# Patient Record
Sex: Female | Born: 1975 | Race: White | Hispanic: No | Marital: Married | Smoking: Never smoker
Health system: Southern US, Community
[De-identification: ages and names within clinical notes are randomized; demographics above are authoritative.]

## PROBLEM LIST (undated history)

## (undated) DIAGNOSIS — F419 Anxiety disorder, unspecified: Secondary | ICD-10-CM

## (undated) DIAGNOSIS — T7840XA Allergy, unspecified, initial encounter: Secondary | ICD-10-CM

## (undated) DIAGNOSIS — F2081 Schizophreniform disorder: Secondary | ICD-10-CM

## (undated) DIAGNOSIS — K2970 Gastritis, unspecified, without bleeding: Secondary | ICD-10-CM

## (undated) DIAGNOSIS — E785 Hyperlipidemia, unspecified: Secondary | ICD-10-CM

## (undated) DIAGNOSIS — M1990 Unspecified osteoarthritis, unspecified site: Secondary | ICD-10-CM

## (undated) DIAGNOSIS — E079 Disorder of thyroid, unspecified: Secondary | ICD-10-CM

## (undated) DIAGNOSIS — K5790 Diverticulosis of intestine, part unspecified, without perforation or abscess without bleeding: Secondary | ICD-10-CM

## (undated) DIAGNOSIS — F339 Major depressive disorder, recurrent, unspecified: Secondary | ICD-10-CM

## (undated) HISTORY — DX: Diverticulosis of intestine, part unspecified, without perforation or abscess without bleeding: K57.90

## (undated) HISTORY — DX: Disorder of thyroid, unspecified: E07.9

## (undated) HISTORY — DX: Unspecified osteoarthritis, unspecified site: M19.90

## (undated) HISTORY — DX: Hyperlipidemia, unspecified: E78.5

## (undated) HISTORY — DX: Anxiety disorder, unspecified: F41.9

## (undated) HISTORY — DX: Gastritis, unspecified, without bleeding: K29.70

## (undated) HISTORY — DX: Allergy, unspecified, initial encounter: T78.40XA

---
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
## 1997-03-24 HISTORY — PX: KNEE ARTHROSCOPY: SUR90

## 1998-03-24 HISTORY — PX: ANKLE ARTHROSCOPY: SUR85

## 1999-03-25 HISTORY — PX: LAPAROSCOPY: SHX197

## 2002-10-14 ENCOUNTER — Inpatient Hospital Stay (HOSPITAL_COMMUNITY): Admission: AD | Admit: 2002-10-14 | Discharge: 2002-10-16 | Payer: Self-pay | Admitting: Obstetrics and Gynecology

## 2002-10-17 ENCOUNTER — Encounter: Admission: RE | Admit: 2002-10-17 | Discharge: 2002-11-16 | Payer: Self-pay | Admitting: *Deleted

## 2002-11-17 ENCOUNTER — Encounter: Admission: RE | Admit: 2002-11-17 | Discharge: 2002-12-17 | Payer: Self-pay | Admitting: *Deleted

## 2002-11-17 ENCOUNTER — Other Ambulatory Visit: Admission: RE | Admit: 2002-11-17 | Discharge: 2002-11-17 | Payer: Self-pay | Admitting: Obstetrics and Gynecology

## 2002-12-18 ENCOUNTER — Encounter: Admission: RE | Admit: 2002-12-18 | Discharge: 2003-01-17 | Payer: Self-pay | Admitting: *Deleted

## 2004-03-24 HISTORY — PX: NASAL POLYP SURGERY: SHX186

## 2006-07-23 ENCOUNTER — Ambulatory Visit: Payer: Self-pay | Admitting: Internal Medicine

## 2006-08-18 ENCOUNTER — Ambulatory Visit (HOSPITAL_BASED_OUTPATIENT_CLINIC_OR_DEPARTMENT_OTHER): Admission: RE | Admit: 2006-08-18 | Discharge: 2006-08-18 | Payer: Self-pay | Admitting: Internal Medicine

## 2006-08-30 ENCOUNTER — Ambulatory Visit: Payer: Self-pay | Admitting: Internal Medicine

## 2006-09-04 ENCOUNTER — Ambulatory Visit: Payer: Self-pay | Admitting: Internal Medicine

## 2006-09-29 ENCOUNTER — Ambulatory Visit (HOSPITAL_COMMUNITY): Admission: RE | Admit: 2006-09-29 | Discharge: 2006-09-29 | Payer: Self-pay | Admitting: Chiropractic Medicine

## 2006-10-12 ENCOUNTER — Encounter: Admission: RE | Admit: 2006-10-12 | Discharge: 2007-01-10 | Payer: Self-pay | Admitting: Internal Medicine

## 2006-10-19 ENCOUNTER — Ambulatory Visit: Payer: Self-pay | Admitting: Internal Medicine

## 2006-12-03 ENCOUNTER — Ambulatory Visit: Payer: Self-pay | Admitting: Internal Medicine

## 2007-02-09 ENCOUNTER — Telehealth: Payer: Self-pay | Admitting: Internal Medicine

## 2007-02-10 ENCOUNTER — Telehealth: Payer: Self-pay | Admitting: Internal Medicine

## 2007-02-15 ENCOUNTER — Telehealth: Payer: Self-pay | Admitting: Internal Medicine

## 2007-03-02 ENCOUNTER — Ambulatory Visit: Payer: Self-pay | Admitting: Internal Medicine

## 2007-04-20 ENCOUNTER — Telehealth: Payer: Self-pay | Admitting: Internal Medicine

## 2007-05-04 ENCOUNTER — Ambulatory Visit: Payer: Self-pay | Admitting: Internal Medicine

## 2007-05-10 ENCOUNTER — Telehealth (INDEPENDENT_AMBULATORY_CARE_PROVIDER_SITE_OTHER): Payer: Self-pay | Admitting: *Deleted

## 2007-05-27 ENCOUNTER — Telehealth: Payer: Self-pay | Admitting: Internal Medicine

## 2007-05-28 ENCOUNTER — Emergency Department (HOSPITAL_COMMUNITY): Admission: EM | Admit: 2007-05-28 | Discharge: 2007-05-29 | Payer: Self-pay | Admitting: Emergency Medicine

## 2007-06-04 ENCOUNTER — Telehealth (INDEPENDENT_AMBULATORY_CARE_PROVIDER_SITE_OTHER): Payer: Self-pay | Admitting: *Deleted

## 2007-06-28 ENCOUNTER — Telehealth (INDEPENDENT_AMBULATORY_CARE_PROVIDER_SITE_OTHER): Payer: Self-pay | Admitting: *Deleted

## 2007-08-10 ENCOUNTER — Telehealth (INDEPENDENT_AMBULATORY_CARE_PROVIDER_SITE_OTHER): Payer: Self-pay | Admitting: *Deleted

## 2007-09-01 ENCOUNTER — Ambulatory Visit: Payer: Self-pay | Admitting: Internal Medicine

## 2007-10-12 ENCOUNTER — Telehealth: Payer: Self-pay | Admitting: Internal Medicine

## 2007-11-19 ENCOUNTER — Telehealth: Payer: Self-pay | Admitting: Internal Medicine

## 2007-12-29 ENCOUNTER — Telehealth: Payer: Self-pay | Admitting: Internal Medicine

## 2008-02-04 ENCOUNTER — Telehealth: Payer: Self-pay | Admitting: Internal Medicine

## 2008-03-14 ENCOUNTER — Telehealth: Payer: Self-pay | Admitting: Internal Medicine

## 2008-04-14 ENCOUNTER — Ambulatory Visit: Payer: Self-pay | Admitting: Internal Medicine

## 2008-06-15 ENCOUNTER — Telehealth: Payer: Self-pay | Admitting: Internal Medicine

## 2009-06-22 ENCOUNTER — Telehealth (INDEPENDENT_AMBULATORY_CARE_PROVIDER_SITE_OTHER): Payer: Self-pay | Admitting: *Deleted

## 2010-04-23 NOTE — Progress Notes (Signed)
Summary: Records request from [facility]  Request for records received from [facility]. Request forwarded to [employer]. Charlotte Wise  June 22, 2009 10:21 AM

## 2010-08-06 NOTE — Assessment & Plan Note (Signed)
[name]                             PULMONARY OFFICE NOTE   Charlotte Wise, Charlotte Wise                      MRN:          [id number]  DATE:10/19/2006                            DOB:          01-17-1976    PROBLEM:  Idiopathic hypersomnolence.   HISTORY:  She tried Ritalin 20 mg sustained release, recognizing no  effect at all at 2 of them daily.  She tried 3 daily and 4 daily,  although that was not included in the instructions I gave her.  It did  not seem to help her tiredness, but did make her nauseated.  I spoke  with her firmly about exceeding guidelines on these meds.  She tried  b.i.d. Provigil and said her life is better, but she still needed to  take a caffeine tablet in the morning.  Occasionally, she crashes and  has to go to bed early.  We discussed naps and good sleep hygiene.  She  says she has felt best when she took phentermine 15 mg from her primary  physician prescribed for weight loss.  It helps more to alert her,  especially if combined with caffeine, but can cause palpitation.  Today  she took phentermine and caffeine, but not Provigil.   Her medication is reviewed and listed without changes.   OBJECTIVE:  Weight 236 pounds, BP 116/62, pulse 65 and regular.  Room  air saturation 100%.  She seems alert and focused, significantly obese, no tremor.  Heart sounds are regular without murmur or gallop or extra beats.  Breathing is unlabored.   IMPRESSION:  Idiopathic hypersomnolence.  Relative resistance to  stimulant medications is characteristic of this pattern.   PLAN:  I reviewed Epocrates listing on Xyrem with her and gave her some  patient information and a contact for further information.  We discussed  Dexedrine, and we are going to try that first, giving 10 mg #50 1 or 2  t.i.d. p.r.n., not to exceed 60 mg daily and not to combine with other  meds.  Schedule return 6 weeks, earlier p.r.n.     Clinton D. Maple Hudson, MD, [facility],  FACP  Electronically Signed    CDY/MedQ  DD: 10/19/2006  DT: 10/20/2006  Job #: [id number]   cc:   Lovenia Kim, D.O.

## 2010-08-06 NOTE — Assessment & Plan Note (Signed)
Charlotte Wise                             PULMONARY OFFICE NOTE   Charlotte, Wise                      MRN:          [id number]  DATE:12/03/2006                            DOB:          1976-02-26    PROBLEM:  Idiopathic hypersomnia.   HISTORY:  She has been taking Provigil, one or two a day.  She tried  Dexedrine up to 40 mg a day with no effect at all.  She said at 5-6 tabs  a day (50-60 mg) it would seem to work well but sometimes she would have  a twitch in her eye or transient visual blurring.  She prefers to take  Provigil 200 mg plus caffeine and says then she is usually okay.  I had  a little trouble keeping track of what she felt her best regimen was and  I am not sure if she is clear about this.  We discussed sustained  release Dexedrine.  We had discussed Xyrem last time and she has looked  it up, still considering.  By the end of her discussion, she had decided  that she wanted to primarily use Dexedrine sometimes turning to Provigil  if the Dexedrine did not seem to work well enough.  She has not had  chest pain or palpitation.  Some days by the time she feels really  awake, it is bedtime and she cannot sleep.  We discussed sleep hygiene,  appropriate use of her stimulant medications, and the benefits of  getting some sort of regular exercise.  If those are not sufficient,  then I am giving her a trial of low dose Restoril with samples for  occasional help initiating sleep.   OBJECTIVE:  VITAL SIGNS:  Weight 236 pounds, BP 124/64, pulse 61, room  air saturation 99%.  GENERAL:  She is really quite obese.  She seems alert with a  contemplative sort of air about her.  She seems careful and thoughtful  rather than just drifting or tired.  NEUROLOGIC:  I see no tremor.  HEART:  Pulse is regular without murmur.   IMPRESSION:  Idiopathic hypersomnia.  Earlier workup had shown an AHI of  0.8 per hour without evidence of sleep apnea  despite her weight.   PLAN:  1. She may try Dexedrine 10 mg tabs, up to 180 mg dispensed for up to      6 daily p.r.n.  2. Try Restoril samples 7.5 mg one p.r.n. at h.s.  3. Walk for exercise and maintain good sleep habits.  4. Schedule return in 2 months and consider Xyrem again at that time.     Charlotte D. Maple Hudson, MD, Charlotte Wise, FACP  Electronically Signed    CDY/MedQ  DD: 12/03/2006  DT: 12/04/2006  Job #: [id number]   cc:   Charlotte Wise, D.O.

## 2010-08-06 NOTE — Procedures (Signed)
Charlotte Wise, Charlotte Wise               ACCOUNT NO.:  [id number]   MEDICAL RECORD NO.:  [id number]          PATIENT TYPE:  OUT   LOCATION:  SLEEP CENTER                 FACILITY:  [facility]   PHYSICIAN:  Clinton D. Maple Hudson, MD, FCCP, FACPDATE OF BIRTH:  04/28/74   DATE OF STUDY:                            NOCTURNAL POLYSOMNOGRAM   REFERRING PHYSICIAN:  Clinton D. Young, MD, FCCP, FACP   INDICATION FOR STUDY:  Hypersomnia with sleep apnea.   EPWORTH SLEEPINESS SCORE:  8/24.  BMI 43.7, weight 232 pounds.   MEDICATIONS:  Home medications are listed and reviewed, including  levothyroxine and Provigil. She did not take Provigil for 2 days before  this study.   SLEEP ARCHITECTURE:  Total sleep time 447 minutes with sleep efficiency  94%.  Stage I was 4%, stage II 77%, stages III and IV 10%, REM 9% of  total sleep time.  Sleep latency 11 minutes, REM latency 171 minutes,  awake after sleep onset 19 minutes, arousal index 16.9.  Bedtime  medication included Metoprolol, paroxetine, and Tri-Sprintec.   RESPIRATORY DATA:  Apnea/hypopnea index (AHI, RDI) 0.8 obstructive  events per hour which is within normal limits (normal range is 0-5 per  hour). There were 3 obstructive apnea's and 3 hypopnea's.  All events  were recorded while she was sleeping supine.  REM AHI 1.4/Hr.   OXYGEN DATA:  Mild snoring with oxygen desaturation to a nadir of 86%.  Mean oxygen saturation through the study was 95% on room air.   CARDIAC DATA:  Normal sinus rhythm.   MOVEMENT-PARASOMNIA:  No limb jerk or unusual movement.  No bathroom  visits.   IMPRESSIONS-RECOMMENDATIONS:  1. Unremarkable nighttime sleep with reduced percentage time in REM      which may reflect unfamiliar environment or use of antidepressant      medication.  2.  No significant sleep disorder breathing, AHI      0.8/Hr.      with mild snoring.  2. See report of multiple sleep latency test.      Clinton D. Maple Hudson, MD, [facility], FACP  Diplomate,  Biomedical engineer of Sleep Medicine  Electronically Signed     CDY/MEDQ  D:  08/29/2006 11:42:23  T:  08/29/2006 20:04:11  Job:  [id number]

## 2010-08-06 NOTE — Assessment & Plan Note (Signed)
[facility]                             PULMONARY OFFICE NOTE   Charlotte Wise, Charlotte Wise                      MRN:          [id number]  DATE:09/04/2006                            DOB:          [birth year]    PROBLEM:  Idiopathic hypersomnolence.   HISTORY:  She returns now for followup after her sleep studies.  Her  nocturnal polysomnogram showed unremarkable nighttime sleep with no  significant breathing disorder.  AHI was 0.8 per hour.  There was mild  snoring, and transient oxygen desaturation with no unusual movement or  activity.  The next day she had a Multiple Sleep Latency Test.  Her mean  sleep latency was 3.6 minutes with no REM on any nap.  She did sleep for  each nap.   OBJECTIVE:  Weight 230 pounds.  BP 102/70.  Pulse 66.  Room air  saturation 98%.  She was quiet, obese, no tremor.  HEART:  Sounds regular.  No murmur or gallop.  Breathing unlabored.   MEDICATIONS:  Levothyroxine, glucosamine, Provigil, birth control pill,  Paxil, metoprolol, p.r.n. use of Ecotrin, and Migranal nasal spray.   DRUG INTOLERANCE TO:  1. PENICILLIN.  2. OXYCODONE.  3. HYDROCODONE.   IMPRESSION:  Her sleep studies confirm significant daytime sleepiness in  a pathologic range, but do not point to a specific cause.  We have  discussed treatment options.  She had found some benefit from Provigil  200 mg daily.  We discussed that medication, and the potential for  increasing the dose to as much as 400 mg a day.  As a comparison, we  discussed Ritalin 20 mg SR.  I contrasted these drugs, discussed  controlled status, side effects, and dependence.  I emphasized the  benefit of short naps at available times.  She indicates this would be  very hard.  I discussed sleep hygiene and lifestyle issues.   PLAN:  1. Provigil is rewritten to provide 200 mg 1 or 2 daily p.r.n.  2. Alternative comparison prescription is given for generic Ritalin 20      mg SR, number 30  to take 1 or 2 daily as discussed.  3. Schedule return in 1 month, earlier p.r.n.    Clinton D. Maple Hudson, MD, [facility]oll[facility], FACP  Electronically Signed   CDY/MedQ  DD: 09/05/2006  DT: 09/05/2006  Job #: [id number]   cc:   Lovenia Kim, D.O.

## 2010-08-06 NOTE — Procedures (Signed)
Charlotte Wise, Charlotte Wise               ACCOUNT NO.:  [id number]   MEDICAL RECORD NO.:  [id number]          PATIENT TYPE:  OUT   LOCATION:  SLEEP CENTER                 FACILITY:  [facility]   PHYSICIAN:  Clinton D. Maple Hudson, MD, FCCP, FACPDATE OF BIRTH:  17[birth year]Sep-1974   DATE OF STUDY:  08/19/2006                          MULTIPLE SLEEP LATENCY TEST   REFERRING PHYSICIAN:  Clinton D. Young, MD, FCCP, FACP   INDICATION FOR STUDY:  Multiple sleep latency test for evaluation of  narcolepsy with hypersomnia.  Compare with nocturnal polysomnogram done  on Aug 18, 2006 which recorded an apnea/hypopnea index of 0.8 per hour.  Total sleep time of 447 minutes, with 9% REM.   HOME MEDICATIONS:  Levothyroxine, metoprolol, paroxetine, Tri-Sprintec.  Provigil 200 mg had not been taken in 2 days prior to this study.  Paroxetine is recognized as likely to affect REM.   NAP ARCHITECTURE:  NAP #1:  8:00 a.m.  Sleep latency 0.  Total sleep  time 15 minutes.  REM latency not applicable.  NAP #2:  10:00 a.m.  Sleep latency 2 minutes.  Total sleep time 15  minutes.  REM latency not applicable.  NAP #3:  12:00 p.m.  Sleep latency 4 minutes.  Total sleep time 15  minutes.  REM Latency not applicable.  NAP #4:  2:00 p.m.  Sleep latency 6 minutes.  Total sleep time 15  minutes.  REM latency not applicable.  NAP #5:  4:00 p.m.  Sleep latency 6 minutes.  Total sleep time 14  minutes.  REM latency not applicable.   IMPRESSIONS-RECOMMENDATIONS:  Mean sleep latency 3.6 minutes with no REM  events.  She slept each nap.  Light snoring was noted.  She had taken  glucosamine and Levothyroxine at 3:00 p.m.Marland Kitchen  There was excessive daytime  somnolence, nonspecific with absence of REM on these naps.  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      Clinton D. Maple Hudson, MD, [facility], FACP  Diplomate, Biomedical engineer of Sleep Medicine  Electronically Signed     CDY/MEDQ  D:  08/29/2006 11:54:33  T:  08/30/2006 01:15:38  Job:  [id number]

## 2010-08-09 NOTE — Assessment & Plan Note (Signed)
[facility]                             PULMONARY OFFICE NOTE   Charlotte Wise, Charlotte Wise                      MRN:          [id number]  DATE:07/23/2006                            DOB:          [birth year]    PROBLEM:  A 35 year old woman referred in sleep medicine consultation at  the kind request of Dr. Elisabeth Most with complaint of excessive daytime  sleepiness.   HISTORY OF PRESENT ILLNESS:  She complains of a persistent sense of  fatigue, never rested, no energy. This began around age 42. She was  treated for depression and saw a psychiatrist for about a year and a  half after childbirth but saw no benefit. She is now trying Provigil and  Phentermine, looking for energy and weight loss, while exercising. She  has had a number of tests including a previous sleep study at [facility] without physician evaluation. They found loud snoring but no  evidence of sleep disorder breathing or movement related sleep disorder.  She had an allergy evaluation with some positive skin tests. She tried  steroid taper and injection with no benefit and meds were stopped.  Bedtime is between 9:30 and 10:30 p.m., estimating 20 minute sleep onset  and waking once for the bathroom before up at 6:15. Her main complaint  is that she craves opportunities to nap and never feels refreshed. I  don't get a history of sleep paralysis, cataplexy, or hypnagogic  hallucination, and she does not dream during naps. She has gained about  40 pounds in the last 2 years and understands that she snores some but  wears earplugs to defend against her husband snoring. There had been  some question of chronic fatigue syndrome and discussion of endocrine  evaluation.   MEDICATIONS:  Levothyroxine, multivitamins, glucosamine, Taurine,  Provigil, Phentermine, p.r.n. use of Migranal nasal spray, and of  Ecotrin with Phenergan for migraine.   ALLERGIES:  PENICILLIN, OXYCODONE, HYDROCODONE.   REVIEW OF SYSTEMS:  A 40 pounds weight gain in recent years. Daytime  sleepiness when driving or inactive. Non-restorative sleep, daytime  naps, some bruxism.   PAST MEDICAL HISTORY:  Seasonal allergic rhinitis, frequent headaches,  skin test positive.   DIAGNOSTIC STUDIES:  MRI of the brain showed small white areas, raising  question of vasculopathy. Apparently, neurology did not feel that she  had MS. She tells me that gluten enteropathy and metal toxicity have  been ruled out.   PAST SURGICAL HISTORY:  Surgeries have included arthroscopic surgery on  her knees and left ankle and tonsillectomy. She had no problems with  these surgeries.   SOCIAL HISTORY:  Alcoholic beverage maybe twice a week. Son was born at  age 30, is now in daycare. She is married. Works as a Clinical cytogeneticist for a [employer]   FAMILY HISTORY:  Allergy, thyroid problems. Grandmother had bone and  thyroid cancer. Nobody with sleep problems.   OBJECTIVE:  VITAL SIGNS:  Weight 222 pounds. Blood pressure 100/74,  pulse 73, room air saturation 98%.  GENERAL:  She is quite obese. Affect is rather  flat. Maybe depressed but  alert and oriented. Speech is clear.  NEUROLOGIC:  Unremarkable to observation otherwise.  HEART:  Sounds are regular without murmur or gallop.  HEENT:  Pharynx spacing 2 to 3/4 with no stridor or thyromegaly.  LUNGS:  Fields are clear.   IMPRESSION:  Significant but non-specific daytime somnolence by report.  She had had a previous sleep study in the past but has gained  considerable weight since then and I think of the most useful direction,  would be to repeat the sleep study and also to get a multiple sleep  latency test, attempting objective confirmation of her complaint of  sleepiness during the day. Will schedule these sleep studies and she  will return after that is completed.   I appreciate the chance to meet her.     Clinton D. Maple Hudson, MD, [facility]oll[facility], FACP   Electronically Signed    CDY/MedQ  DD: 07/25/2006  DT: 07/25/2006  Job #: [id number]   cc:   Lovenia Kim, D.O.  [facility]

## 2010-12-16 LAB — URINE CULTURE: Colony Count: 1000

## 2010-12-16 LAB — I-STAT 8, (EC8 V) (CONVERTED LAB)
Acid-base deficit: 4 — ABNORMAL HIGH
BUN: 12
Bicarbonate: 18.9 — ABNORMAL LOW
Chloride: 106
Glucose, Bld: 120 — ABNORMAL HIGH
HCT: 38
Hemoglobin: 12.9
Operator id: 133351
Potassium: 3.9
Sodium: 135
TCO2: 20
pCO2, Ven: 29 — ABNORMAL LOW
pH, Ven: 7.421 — ABNORMAL HIGH

## 2010-12-16 LAB — WET PREP, GENITAL
Clue Cells Wet Prep HPF POC: NONE SEEN
Trich, Wet Prep: NONE SEEN
Yeast Wet Prep HPF POC: NONE SEEN

## 2010-12-16 LAB — URINALYSIS, ROUTINE W REFLEX MICROSCOPIC
Bilirubin Urine: NEGATIVE
Glucose, UA: NEGATIVE
Hgb urine dipstick: NEGATIVE
Ketones, ur: NEGATIVE
Nitrite: NEGATIVE
Protein, ur: NEGATIVE
Specific Gravity, Urine: 1.016
Urobilinogen, UA: 0.2
pH: 5.5

## 2010-12-16 LAB — GC/CHLAMYDIA PROBE AMP, GENITAL
Chlamydia, DNA Probe: NEGATIVE
GC Probe Amp, Genital: NEGATIVE

## 2010-12-16 LAB — URINE MICROSCOPIC-ADD ON

## 2010-12-16 LAB — POCT I-STAT CREATININE
Creatinine, Ser: 0.9
Operator id: 133351

## 2010-12-16 LAB — PREGNANCY, URINE: Preg Test, Ur: NEGATIVE

## 2010-12-16 LAB — RPR: RPR Ser Ql: NONREACTIVE

## 2014-12-21 ENCOUNTER — Ambulatory Visit: Payer: Self-pay | Admitting: Internal Medicine

## 2014-12-25 ENCOUNTER — Ambulatory Visit (INDEPENDENT_AMBULATORY_CARE_PROVIDER_SITE_OTHER): Payer: BLUE CROSS/BLUE SHIELD | Admitting: Internal Medicine

## 2014-12-25 ENCOUNTER — Encounter: Payer: Self-pay | Admitting: Internal Medicine

## 2014-12-25 VITALS — BP 110/64 | HR 70 | Temp 98.6°F | Resp 16 | Ht 61.25 in | Wt 172.0 lb

## 2014-12-25 DIAGNOSIS — J309 Allergic rhinitis, unspecified: Secondary | ICD-10-CM

## 2014-12-25 DIAGNOSIS — E038 Other specified hypothyroidism: Secondary | ICD-10-CM | POA: Diagnosis not present

## 2014-12-25 DIAGNOSIS — M62838 Other muscle spasm: Secondary | ICD-10-CM | POA: Diagnosis not present

## 2014-12-25 DIAGNOSIS — G4710 Hypersomnia, unspecified: Secondary | ICD-10-CM

## 2014-12-25 MED ORDER — LEVOTHYROXINE SODIUM 75 MCG PO TABS: 75.0000 ug | ORAL_TABLET | Freq: Every day | ORAL | Status: DC

## 2014-12-25 MED ORDER — TEMAZEPAM 30 MG PO CAPS: 30.0000 mg | ORAL_CAPSULE | ORAL | Status: DC | PRN

## 2014-12-25 MED ORDER — CARISOPRODOL 350 MG PO TABS: 350.0000 mg | ORAL_TABLET | Freq: Three times a day (TID) | ORAL | Status: DC | PRN

## 2014-12-25 NOTE — Progress Notes (Signed)
Patient ID: Charlotte Wise, female   DOB: Jan 18, 1976, 39 y.o.   MRN: [id number]    Annual Screening Comprehensive Examination   This very nice 39 y.o.female presents for establishment as a new patient.  She reports that she used to be a patient here and they moved away and are just now moving back.  She reports that she currently has hypothyroidism.  She does take levothyroxine for this.    She is currently in the process of trying to get her weight under control and exercising.  She reports that she has been walking daily and she has also been trying to get a healthier lifestyle together.  She reports that she is not currently working at the moment.    She reports that she does see Dr. Leonides Schanz who is a Restaurant manager, fast food here in [place].  She feels like she has had some neck and back pain.    She reports that she is no longer seeing Dr. Annamaria Boots for allergies.    She reports that she does not want any blood work today.    No current outpatient prescriptions on file prior to visit.   No current facility-administered medications on file prior to visit.    Allergies  Allergen Reactions  . Hydrocodone   . Penicillins Swelling    No past medical history on file.   There is no immunization history on file for this patient.  No past surgical history on file.  Family History  Problem Relation Age of Onset  . Cancer Maternal Grandmother   . Hyperlipidemia Paternal Grandfather   . Miscarriages / Stillbirths Paternal Grandfather     Social History   Social History  . Marital Status: Married    Spouse Name: N/A  . Number of Children: N/A  . Years of Education: N/A   Occupational History  . Not on file.   Social History Main Topics  . Smoking status: Never Smoker   . Smokeless tobacco: Not on file  . Alcohol Use: Not on file  . Drug Use: Not on file  . Sexual Activity: Not on file   Other Topics Concern  . Not on file   Social History Narrative  . No narrative on file    Review of Systems  Constitutional: Negative for fever and malaise/fatigue.  HENT: Negative for congestion, ear pain and sore throat.   Eyes: Negative for blurred vision and double vision.  Respiratory: Negative for cough, shortness of breath and wheezing.   Cardiovascular: Negative for chest pain, palpitations and leg swelling.  Gastrointestinal: Negative for heartburn, diarrhea, constipation, blood in stool and melena.  Genitourinary: Negative.   Skin: Negative.   Neurological: Negative for dizziness, sensory change, loss of consciousness and headaches.  Psychiatric/Behavioral: Negative for depression. The patient is not nervous/anxious and does not have insomnia.       Physical Exam  BP 110/64 mmHg  Pulse 70  Temp(Src) 98.6 F (37 C) (Temporal)  Resp 16  Ht 5' 1.25" (1.556 m)  Wt 172 lb (78.019 kg)  BMI 32.22 kg/m2  LMP 12/04/2014  General Appearance: Well nourished and in no apparent distress. Eyes: PERRLA, EOMs, conjunctiva no swelling or erythema, normal fundi and vessels. Sinuses: No frontal/maxillary tenderness ENT/Mouth: EACs patent / TMs  nl. Nares clear without erythema, swelling, mucoid exudates. Oral hygiene is good. No erythema, swelling, or exudate. Tongue normal, non-obstructing. Tonsils not swollen or erythematous. Hearing normal.  Neck: Supple, thyroid normal. No bruits, nodes or JVD. Respiratory: Respiratory effort normal.  BS equal and clear bilateral without rales, rhonci, wheezing or stridor. Cardio: Heart sounds are normal with regular rate and rhythm and no murmurs, rubs or gallops. Peripheral pulses are normal and equal bilaterally without edema. No aortic or femoral bruits. Chest: symmetric with normal excursions and percussion. Breasts: Symmetric, without lumps, nipple discharge, retractions, or fibrocystic changes.  Abdomen: Flat, soft, with bowl sounds. Nontender, no guarding, rebound, hernias, masses, or organomegaly.  Lymphatics: Non tender  without lymphadenopathy.  Genitourinary:  Musculoskeletal: Full ROM all peripheral extremities, joint stability, 5/5 strength, and normal gait. Skin: Warm and dry without rashes, lesions, cyanosis, clubbing or  ecchymosis.  Neuro: Cranial nerves intact, reflexes equal bilaterally. Normal muscle tone, no cerebellar symptoms. Sensation intact.  Pysch: Awake and oriented X 3, normal affect, Insight and Judgment appropriate.   Assessment and Plan    1. Allergic rhinitis, unspecified allergic rhinitis type -currently well controlled and not on medications  2. HYPERSOMNIA, IDIOPATHIC -temazepam for sleeping given -recommended continuing exercise and stretching  3. Muscle spasm -soma given  4.  Hypothryoidism -cont levothyroxine -3 months prescription given  Patient was argumentative and insistent on not doing blood work today as she didn't want to pay for it.  She reports that she just had it at her previous office 2 months ago.  She was counseled on the effects of not being in correct thyroid range and recognized that she was okay not getting it tested.   Patient also offered flu shot today which she declined and stated she would be receiving at work.    Continue prudent diet as discussed, weight control, regular exercise, and medications. Routine screening labs and tests as requested with regular follow-up as recommended.  Over 40 minutes of exam, counseling, chart review and critical decision making was performed

## 2015-02-13 ENCOUNTER — Other Ambulatory Visit: Payer: Self-pay | Admitting: Internal Medicine

## 2015-03-28 ENCOUNTER — Encounter: Payer: Self-pay | Admitting: Internal Medicine

## 2015-03-28 ENCOUNTER — Ambulatory Visit (INDEPENDENT_AMBULATORY_CARE_PROVIDER_SITE_OTHER): Payer: BLUE CROSS/BLUE SHIELD | Admitting: Internal Medicine

## 2015-03-28 VITALS — BP 130/78 | HR 90 | Temp 98.2°F | Resp 16 | Ht 61.25 in | Wt 174.0 lb

## 2015-03-28 DIAGNOSIS — E039 Hypothyroidism, unspecified: Secondary | ICD-10-CM

## 2015-03-28 DIAGNOSIS — Z79899 Other long term (current) drug therapy: Secondary | ICD-10-CM | POA: Diagnosis not present

## 2015-03-28 LAB — CBC WITH DIFFERENTIAL/PLATELET
Basophils Absolute: 0 10*3/uL (ref 0.0–0.1)
Basophils Relative: 0 % (ref 0–1)
Eosinophils Absolute: 0.4 10*3/uL (ref 0.0–0.7)
Eosinophils Relative: 5 % (ref 0–5)
HCT: 35.5 % — ABNORMAL LOW (ref 36.0–46.0)
Hemoglobin: 12.1 g/dL (ref 12.0–15.0)
Lymphocytes Relative: 26 % (ref 12–46)
Lymphs Abs: 1.9 10*3/uL (ref 0.7–4.0)
MCH: 32.1 pg (ref 26.0–34.0)
MCHC: 34.1 g/dL (ref 30.0–36.0)
MCV: 94.2 fL (ref 78.0–100.0)
MPV: 8.8 fL (ref 8.6–12.4)
Monocytes Absolute: 0.7 10*3/uL (ref 0.1–1.0)
Monocytes Relative: 9 % (ref 3–12)
Neutro Abs: 4.4 10*3/uL (ref 1.7–7.7)
Neutrophils Relative %: 60 % (ref 43–77)
Platelets: 333 10*3/uL (ref 150–400)
RBC: 3.77 MIL/uL — ABNORMAL LOW (ref 3.87–5.11)
RDW: 14.5 % (ref 11.5–15.5)
WBC: 7.4 10*3/uL (ref 4.0–10.5)

## 2015-03-28 LAB — BASIC METABOLIC PANEL WITH GFR
BUN: 13 mg/dL (ref 7–25)
CO2: 24 mmol/L (ref 20–31)
Calcium: 9 mg/dL (ref 8.6–10.2)
Chloride: 106 mmol/L (ref 98–110)
Creat: 0.84 mg/dL (ref 0.50–1.10)
GFR, Est African American: >89 mL/min (ref >=60)
GFR, Est Non African American: 87 mL/min (ref >=60)
Glucose, Bld: 91 mg/dL (ref 65–99)
Potassium: 4.6 mmol/L (ref 3.5–5.3)
Sodium: 139 mmol/L (ref 135–146)

## 2015-03-28 LAB — HEPATIC FUNCTION PANEL
ALT: 13 U/L (ref 6–29)
AST: 15 U/L (ref 10–30)
Albumin: 4.2 g/dL (ref 3.6–5.1)
Alkaline Phosphatase: 47 U/L (ref 33–115)
Bilirubin, Direct: 0.1 mg/dL (ref <=0.2)
Indirect Bilirubin: 0.6 mg/dL (ref 0.2–1.2)
Total Bilirubin: 0.7 mg/dL (ref 0.2–1.2)
Total Protein: 6.3 g/dL (ref 6.1–8.1)

## 2015-03-28 LAB — TSH: TSH: 1.547 u[IU]/mL (ref 0.350–4.500)

## 2015-03-28 MED ORDER — TEMAZEPAM 30 MG PO CAPS: 30.0000 mg | ORAL_CAPSULE | ORAL | Status: DC | PRN

## 2015-03-28 NOTE — Progress Notes (Signed)
Patient ID: Charlotte Wise, female   DOB: 1975-10-01, 40 y.o.   MRN: [id number]  Assessment and Plan:  Hypothyroidism -TSH -may need refill of levothyroxine  Muscle cramps -cont soma prn  Insomnia -Temazepam  Continue diet and meds as discussed. Further disposition pending results of labs.  HPI 40 y.o. female  presents for 3 month follow up with hypertension, hyperlipidemia, prediabetes and vitamin D.  She does report that she has been having cold symptoms for the last 4 days.  She does take sudafed which helps.  She reports that she was around a lot of sick people.    Patient is on Vitamin D supplement. No results found for: VD25OH    Current Medications:  Current Outpatient Prescriptions on File Prior to Visit  Medication Sig Dispense Refill  . carisoprodol (SOMA) 350 MG tablet Take 1 tablet (350 mg total) by mouth every 8 (eight) hours as needed for muscle spasms. 30 tablet 2  . levothyroxine (SYNTHROID, LEVOTHROID) 75 MCG tablet Take 1 tablet (75 mcg total) by mouth daily before breakfast. 90 tablet 1  . temazepam (RESTORIL) 30 MG capsule Take 1 capsule (30 mg total) by mouth as needed for sleep. 30 capsule 2   No current facility-administered medications on file prior to visit.    Medical History:  Past Medical History  Diagnosis Date  . Allergy   . Anxiety   . Arthritis   . Thyroid disease     Allergies:  Allergies  Allergen Reactions  . Hydrocodone   . Penicillins Swelling     Review of Systems:  Review of Systems  Constitutional: Negative for fever, chills, malaise/fatigue and diaphoresis.  HENT: Positive for congestion. Negative for ear pain and sore throat.   Eyes: Negative.   Respiratory: Positive for cough. Negative for shortness of breath and wheezing.   Cardiovascular: Negative for chest pain, palpitations and leg swelling.  Gastrointestinal: Negative for heartburn, abdominal pain, diarrhea, constipation, blood in stool and melena.  Genitourinary:  Negative.   Skin: Negative.   Neurological: Negative for dizziness, sensory change, loss of consciousness and headaches.  Psychiatric/Behavioral: Negative for depression. The patient is not nervous/anxious and does not have insomnia.     Family history- Review and unchanged  Social history- Review and unchanged  Physical Exam: BP 130/78 mmHg  Pulse 90  Temp(Src) 98.2 F (36.8 C) (Temporal)  Resp 16  Ht 5' 1.25" (1.556 m)  Wt 174 lb (78.926 kg)  BMI 32.60 kg/m2  LMP 03/14/2015 Wt Readings from Last 3 Encounters:  03/28/15 174 lb (78.926 kg)  12/25/14 172 lb (78.019 kg)  04/14/08 227 lb (102.967 kg)    General Appearance: Well nourished well developed, in no apparent distress. Eyes: PERRLA, EOMs, conjunctiva no swelling or erythema ENT/Mouth: Ear canals normal without obstruction, swelling, erythma, discharge.  TMs normal bilaterally.  Oropharynx moist, clear, without exudate, or postoropharyngeal swelling. Neck: Supple, thyroid normal,no cervical adenopathy  Respiratory: Respiratory effort normal, Breath sounds clear A&P without rhonchi, wheeze, or rale.  No retractions, no accessory usage. Cardio: RRR with no MRGs. Brisk peripheral pulses without edema.  Abdomen: Soft, + BS,  Non tender, no guarding, rebound, hernias, masses. Musculoskeletal: Full ROM, 5/5 strength, Normal gait Skin: Warm, dry without rashes, lesions, ecchymosis.  Neuro: Awake and oriented X 3, Cranial nerves intact. Normal muscle tone, no cerebellar symptoms. Psych: Normal affect, Insight and Judgment appropriate.    Starlyn Skeans, PA-C 10:43 AM [facility]

## 2015-03-28 NOTE — Patient Instructions (Addendum)
[facility] Department  Address: [address], [place], [place] [address]  Open today  8AM-5PM  Phone: [phone]  Call here to figure out travel recommendations.  You can also see recommended shots on the CDC websites based on your travel locations.  Please buy flonase, nasacort, or rhinocort 2 sprays per nostril at bedtime to help with nasal drainage and congestion.

## 2015-06-29 DIAGNOSIS — M791 Myalgia: Secondary | ICD-10-CM | POA: Diagnosis not present

## 2015-06-29 DIAGNOSIS — M9906 Segmental and somatic dysfunction of lower extremity: Secondary | ICD-10-CM | POA: Diagnosis not present

## 2015-06-29 DIAGNOSIS — M9905 Segmental and somatic dysfunction of pelvic region: Secondary | ICD-10-CM | POA: Diagnosis not present

## 2015-06-29 DIAGNOSIS — M9904 Segmental and somatic dysfunction of sacral region: Secondary | ICD-10-CM | POA: Diagnosis not present

## 2015-07-19 DIAGNOSIS — M791 Myalgia: Secondary | ICD-10-CM | POA: Diagnosis not present

## 2015-07-19 DIAGNOSIS — M9906 Segmental and somatic dysfunction of lower extremity: Secondary | ICD-10-CM | POA: Diagnosis not present

## 2015-07-19 DIAGNOSIS — M9905 Segmental and somatic dysfunction of pelvic region: Secondary | ICD-10-CM | POA: Diagnosis not present

## 2015-07-19 DIAGNOSIS — M9904 Segmental and somatic dysfunction of sacral region: Secondary | ICD-10-CM | POA: Diagnosis not present

## 2015-07-30 DIAGNOSIS — Z124 Encounter for screening for malignant neoplasm of cervix: Secondary | ICD-10-CM | POA: Diagnosis not present

## 2015-07-30 DIAGNOSIS — N72 Inflammatory disease of cervix uteri: Secondary | ICD-10-CM | POA: Diagnosis not present

## 2015-07-30 DIAGNOSIS — Z01419 Encounter for gynecological examination (general) (routine) without abnormal findings: Secondary | ICD-10-CM | POA: Diagnosis not present

## 2015-07-30 LAB — HM PAP SMEAR: HM Pap smear: NORMAL

## 2015-08-15 DIAGNOSIS — M9905 Segmental and somatic dysfunction of pelvic region: Secondary | ICD-10-CM | POA: Diagnosis not present

## 2015-08-15 DIAGNOSIS — M9904 Segmental and somatic dysfunction of sacral region: Secondary | ICD-10-CM | POA: Diagnosis not present

## 2015-08-15 DIAGNOSIS — M791 Myalgia: Secondary | ICD-10-CM | POA: Diagnosis not present

## 2015-08-15 DIAGNOSIS — M9906 Segmental and somatic dysfunction of lower extremity: Secondary | ICD-10-CM | POA: Diagnosis not present

## 2015-08-21 DIAGNOSIS — Z1231 Encounter for screening mammogram for malignant neoplasm of breast: Secondary | ICD-10-CM | POA: Diagnosis not present

## 2015-09-14 ENCOUNTER — Other Ambulatory Visit: Payer: Self-pay | Admitting: Internal Medicine

## 2015-09-18 DIAGNOSIS — M791 Myalgia: Secondary | ICD-10-CM | POA: Diagnosis not present

## 2015-09-18 DIAGNOSIS — M9901 Segmental and somatic dysfunction of cervical region: Secondary | ICD-10-CM | POA: Diagnosis not present

## 2015-09-18 DIAGNOSIS — M9906 Segmental and somatic dysfunction of lower extremity: Secondary | ICD-10-CM | POA: Diagnosis not present

## 2015-09-18 DIAGNOSIS — M9904 Segmental and somatic dysfunction of sacral region: Secondary | ICD-10-CM | POA: Diagnosis not present

## 2015-09-27 ENCOUNTER — Ambulatory Visit: Payer: Self-pay | Admitting: Internal Medicine

## 2015-10-09 DIAGNOSIS — M542 Cervicalgia: Secondary | ICD-10-CM | POA: Diagnosis not present

## 2015-10-09 DIAGNOSIS — M25551 Pain in right hip: Secondary | ICD-10-CM | POA: Diagnosis not present

## 2015-10-10 ENCOUNTER — Ambulatory Visit: Payer: Self-pay | Admitting: Internal Medicine

## 2015-10-12 ENCOUNTER — Encounter: Payer: Self-pay | Admitting: Internal Medicine

## 2015-10-12 ENCOUNTER — Ambulatory Visit (INDEPENDENT_AMBULATORY_CARE_PROVIDER_SITE_OTHER): Payer: BLUE CROSS/BLUE SHIELD | Admitting: Internal Medicine

## 2015-10-12 VITALS — BP 122/64 | HR 62 | Temp 98.0°F | Resp 16 | Ht 61.25 in | Wt 162.0 lb

## 2015-10-12 DIAGNOSIS — Z130 Encounter for screening for diseases of the blood and blood-forming organs and certain disorders involving the immune mechanism: Secondary | ICD-10-CM | POA: Diagnosis not present

## 2015-10-12 DIAGNOSIS — Z79899 Other long term (current) drug therapy: Secondary | ICD-10-CM | POA: Diagnosis not present

## 2015-10-12 DIAGNOSIS — E039 Hypothyroidism, unspecified: Secondary | ICD-10-CM | POA: Diagnosis not present

## 2015-10-12 DIAGNOSIS — Z131 Encounter for screening for diabetes mellitus: Secondary | ICD-10-CM | POA: Diagnosis not present

## 2015-10-12 DIAGNOSIS — R1031 Right lower quadrant pain: Secondary | ICD-10-CM | POA: Diagnosis not present

## 2015-10-12 LAB — CBC WITH DIFFERENTIAL/PLATELET
Basophils Absolute: 57 cells/uL (ref 0–200)
Basophils Relative: 1 %
Eosinophils Absolute: 285 cells/uL (ref 15–500)
Eosinophils Relative: 5 %
HCT: 38.1 % (ref 35.0–45.0)
Hemoglobin: 12.5 g/dL (ref 11.7–15.5)
Lymphocytes Relative: 35 %
Lymphs Abs: 1995 cells/uL (ref 850–3900)
MCH: 32.1 pg (ref 27.0–33.0)
MCHC: 32.8 g/dL (ref 32.0–36.0)
MCV: 97.7 fL (ref 80.0–100.0)
MPV: 9 fL (ref 7.5–12.5)
Monocytes Absolute: 513 cells/uL (ref 200–950)
Monocytes Relative: 9 %
Neutro Abs: 2850 cells/uL (ref 1500–7800)
Neutrophils Relative %: 50 %
Platelets: 345 10*3/uL (ref 140–400)
RBC: 3.9 MIL/uL (ref 3.80–5.10)
RDW: 14.7 % (ref 11.0–15.0)
WBC: 5.7 10*3/uL (ref 3.8–10.8)

## 2015-10-12 LAB — HEPATIC FUNCTION PANEL
ALT: 13 U/L (ref 6–29)
AST: 19 U/L (ref 10–30)
Albumin: 3.9 g/dL (ref 3.6–5.1)
Alkaline Phosphatase: 51 U/L (ref 33–115)
Bilirubin, Direct: 0.2 mg/dL (ref <=0.2)
Indirect Bilirubin: 0.6 mg/dL (ref 0.2–1.2)
Total Bilirubin: 0.8 mg/dL (ref 0.2–1.2)
Total Protein: 6.2 g/dL (ref 6.1–8.1)

## 2015-10-12 LAB — IRON AND TIBC
%SAT: 30 % (ref 11–50)
Iron: 97 ug/dL (ref 40–190)
TIBC: 328 ug/dL (ref 250–450)
UIBC: 231 ug/dL (ref 125–400)

## 2015-10-12 LAB — BASIC METABOLIC PANEL WITH GFR
BUN: 15 mg/dL (ref 7–25)
CO2: 24 mmol/L (ref 20–31)
Calcium: 8.7 mg/dL (ref 8.6–10.2)
Chloride: 104 mmol/L (ref 98–110)
Creat: 0.98 mg/dL (ref 0.50–1.10)
GFR, Est African American: 83 mL/min (ref >=60)
GFR, Est Non African American: 72 mL/min (ref >=60)
Glucose, Bld: 77 mg/dL (ref 65–99)
Potassium: 4.8 mmol/L (ref 3.5–5.3)
Sodium: 140 mmol/L (ref 135–146)

## 2015-10-12 LAB — VITAMIN B12: Vitamin B-12: 1266 pg/mL — ABNORMAL HIGH (ref 200–1100)

## 2015-10-12 LAB — HEMOGLOBIN A1C
Hgb A1c MFr Bld: 5.2 % (ref <5.7)
Mean Plasma Glucose: 103 mg/dL

## 2015-10-12 LAB — TSH: TSH: 2.05 mIU/L

## 2015-10-12 MED ORDER — TRIAMCINOLONE ACETONIDE 0.1 % EX CREA: 1.0000 "application " | TOPICAL_CREAM | Freq: Four times a day (QID) | CUTANEOUS | Status: DC | PRN

## 2015-10-12 NOTE — Progress Notes (Signed)
Assessment and Plan:   Hypothyroidism -tsh -levothyroxine  RLQ abdominal pain -ultrasound -referral to GI -CBC -BMET -HFP -celiac panel  Neck pain -lyrica -likely fibromyalgia  Screening for def anemia -B12 -Iron and TIBC  Continue diet and meds as discussed. Further disposition pending results of labs.  HPI 40 y.o. female  presents for 6 month follow up with hypothyroid. She reports that she is taking her medication daily on an empty stomach.    She is having some pain in her right lower abdomen.  This has been a spot that has been bothering her for several years.  It does tend to be related to her food intake.  She reports that she has gone to obgyn and ultrasound was normal.  She feels like it might be intestinal .  She reports that the pain is very intermittent.  She reports that the foods that bother her.  She reports that she is concerned that she has some issues with food sensitivity, but she is not sure whether this is a due to food allergy or whether it is an intestinal issue.  She reports that wheat definitely bothers her.  So does dairy and meats.  She thinks that she has been tested for celiacs disease in the past.  She also reports that she has been having some issues with back pain after eating.  She reports that she does think that she takes something to help with her gallbladder.  She reports that she has normal stools.  She goes once per day.  She reports that the stools are well formed.  She does eat plenty of fruits and veggies.  Grains do tend to make her constipated.  She denies family history of IBS, IBD,  Or colon cancer.     She is seeing a PT for her neck and her shoulders and back.  She has been doing dry needling with [facility].  She is wondering whether she can try some lyrica.  She reports that she was not able to take gabapentin.    She also feels like she is hot more often.  She feels like her scalp sweats a lot.  She also notes that  her feet have the same feeling that things are crawling on her when her skin gets warm.  She feels like it may be a heat rash.       Current Medications:  Current Outpatient Prescriptions on File Prior to Visit  Medication Sig Dispense Refill  . carisoprodol (SOMA) 350 MG tablet TAKE 1 TABLET BY MOUTH EVERY 8 HOURS AS NEEDED FOR MUSCLE SPASMS 30 tablet 0  . levothyroxine (SYNTHROID, LEVOTHROID) 75 MCG tablet Take 1 tablet (75 mcg total) by mouth daily before breakfast. 90 tablet 1  . temazepam (RESTORIL) 30 MG capsule TAKE ONE CAPSULE BY MOUTH AS NEEDED FOR SLEEP 30 capsule 0   No current facility-administered medications on file prior to visit.    Medical History:  Past Medical History  Diagnosis Date  . Allergy   . Anxiety   . Arthritis   . Thyroid disease     Allergies:  Allergies  Allergen Reactions  . Hydrocodone   . Penicillins Swelling     Review of Systems:  Review of Systems  Constitutional: Negative for fever, chills and malaise/fatigue.  HENT: Negative for congestion, ear pain and sore throat.   Respiratory: Negative for cough, shortness of breath and wheezing.   Cardiovascular: Negative for chest pain, palpitations and leg swelling.  Gastrointestinal:  Positive for abdominal pain. Negative for heartburn, diarrhea, constipation, blood in stool and melena.  Genitourinary: Negative.   Skin: Negative.   Neurological: Negative for dizziness, sensory change, loss of consciousness and headaches.  Psychiatric/Behavioral: Negative for depression. The patient is not nervous/anxious and does not have insomnia.     Family history- Review and unchanged  Social history- Review and unchanged  Physical Exam: BP 122/64 mmHg  Pulse 62  Temp(Src) 98 F (36.7 C) (Temporal)  Resp 16  Ht 5' 1.25" (1.556 m)  Wt 162 lb (73.483 kg)  BMI 30.35 kg/m2  LMP 09/25/2015 Wt Readings from Last 3 Encounters:  10/12/15 162 lb (73.483 kg)  03/28/15 174 lb (78.926 kg)  12/25/14 172  lb (78.019 kg)    General Appearance: Well nourished well developed, in no apparent distress. Eyes: PERRLA, EOMs, conjunctiva no swelling or erythema ENT/Mouth: Ear canals normal without obstruction, swelling, erythma, discharge.  TMs normal bilaterally.  Oropharynx moist, clear, without exudate, or postoropharyngeal swelling. Neck: Supple, thyroid normal,no cervical adenopathy  Respiratory: Respiratory effort normal, Breath sounds clear A&P without rhonchi, wheeze, or rale.  No retractions, no accessory usage. Cardio: RRR with no MRGs. Brisk peripheral pulses without edema.  Abdomen: Soft, + BS,  Non tender, no guarding, rebound, hernias, masses. Musculoskeletal: Full ROM, 5/5 strength, Normal gait Skin: Warm, dry without rashes, lesions, ecchymosis.  Neuro: Awake and oriented X 3, Cranial nerves intact. Normal muscle tone, no cerebellar symptoms. Psych: Normal affect, Insight and Judgment appropriate.    Starlyn Skeans, PA-C 10:53 AM [facility]

## 2015-10-12 NOTE — Patient Instructions (Signed)
Please start taking lyrica 1 tablet per day at bedtime for 3-5 days.  If you tolerate the medication well then please increase the tablet to twice daily.  You can take this with or without food.    Please increase to 75 mg after you complete the 50 mg tablets.  Call if this works well for you.  Katrina will call you about getting set up with a GI doctor and will also get you set up with an ultrasound

## 2015-10-15 ENCOUNTER — Other Ambulatory Visit: Payer: Self-pay | Admitting: *Deleted

## 2015-10-15 LAB — CELIAC PANEL 10
Endomysial Screen: NEGATIVE
Gliadin IgA: 4 Units (ref <20)
Gliadin IgG: 2 Units (ref <20)
IgA: 146 mg/dL (ref 81–463)
Tissue Transglut Ab: 1 U/mL (ref <6)
Tissue Transglutaminase Ab, IgA: 1 U/mL (ref <4)

## 2015-10-15 MED ORDER — TEMAZEPAM 30 MG PO CAPS: ORAL_CAPSULE | ORAL | 0 refills | Status: DC

## 2015-10-15 MED ORDER — CARISOPRODOL 350 MG PO TABS: ORAL_TABLET | ORAL | 0 refills | Status: DC

## 2015-10-15 MED ORDER — LEVOTHYROXINE SODIUM 75 MCG PO TABS: 75.0000 ug | ORAL_TABLET | Freq: Every day | ORAL | 1 refills | Status: DC

## 2015-10-16 DIAGNOSIS — M9901 Segmental and somatic dysfunction of cervical region: Secondary | ICD-10-CM | POA: Diagnosis not present

## 2015-10-16 DIAGNOSIS — M791 Myalgia: Secondary | ICD-10-CM | POA: Diagnosis not present

## 2015-10-16 DIAGNOSIS — M9904 Segmental and somatic dysfunction of sacral region: Secondary | ICD-10-CM | POA: Diagnosis not present

## 2015-10-16 DIAGNOSIS — M9906 Segmental and somatic dysfunction of lower extremity: Secondary | ICD-10-CM | POA: Diagnosis not present

## 2015-10-30 DIAGNOSIS — M542 Cervicalgia: Secondary | ICD-10-CM | POA: Diagnosis not present

## 2015-10-30 DIAGNOSIS — M25551 Pain in right hip: Secondary | ICD-10-CM | POA: Diagnosis not present

## 2015-11-13 DIAGNOSIS — M9901 Segmental and somatic dysfunction of cervical region: Secondary | ICD-10-CM | POA: Diagnosis not present

## 2015-11-13 DIAGNOSIS — M9906 Segmental and somatic dysfunction of lower extremity: Secondary | ICD-10-CM | POA: Diagnosis not present

## 2015-11-13 DIAGNOSIS — M791 Myalgia: Secondary | ICD-10-CM | POA: Diagnosis not present

## 2015-11-13 DIAGNOSIS — M9904 Segmental and somatic dysfunction of sacral region: Secondary | ICD-10-CM | POA: Diagnosis not present

## 2015-11-21 DIAGNOSIS — M9906 Segmental and somatic dysfunction of lower extremity: Secondary | ICD-10-CM | POA: Diagnosis not present

## 2015-11-21 DIAGNOSIS — M9904 Segmental and somatic dysfunction of sacral region: Secondary | ICD-10-CM | POA: Diagnosis not present

## 2015-11-21 DIAGNOSIS — M9901 Segmental and somatic dysfunction of cervical region: Secondary | ICD-10-CM | POA: Diagnosis not present

## 2015-11-21 DIAGNOSIS — M791 Myalgia: Secondary | ICD-10-CM | POA: Diagnosis not present

## 2015-11-30 ENCOUNTER — Encounter: Payer: Self-pay | Admitting: Gastroenterology

## 2015-11-30 ENCOUNTER — Other Ambulatory Visit: Payer: Self-pay | Admitting: Internal Medicine

## 2015-11-30 DIAGNOSIS — R1011 Right upper quadrant pain: Secondary | ICD-10-CM

## 2015-12-04 ENCOUNTER — Encounter: Payer: Self-pay | Admitting: Gastroenterology

## 2015-12-04 ENCOUNTER — Ambulatory Visit (INDEPENDENT_AMBULATORY_CARE_PROVIDER_SITE_OTHER): Payer: BLUE CROSS/BLUE SHIELD | Admitting: Gastroenterology

## 2015-12-04 VITALS — BP 110/70 | HR 80 | Ht 61.25 in | Wt 163.0 lb

## 2015-12-04 DIAGNOSIS — K589 Irritable bowel syndrome without diarrhea: Secondary | ICD-10-CM | POA: Diagnosis not present

## 2015-12-04 DIAGNOSIS — R140 Abdominal distension (gaseous): Secondary | ICD-10-CM | POA: Diagnosis not present

## 2015-12-04 DIAGNOSIS — R1031 Right lower quadrant pain: Secondary | ICD-10-CM

## 2015-12-04 NOTE — Patient Instructions (Addendum)
Please have your Primary Care doctor fax your ultrasound results to Dr Silverio Decamp at [phone] att [facility]ett  Low FODMAP diet given  Use VSL # 3 112 Units daily , which can be purchased over the counter  Follow up with Dr Silverio Decamp on 02/18/2016 at 10am

## 2015-12-04 NOTE — Progress Notes (Signed)
Charlotte Wise    CS:[id number]    [birth year]  Primary Care Physician:MCKEOWN,WILLIAM DAVID, MD  Referring Physician: Starlyn Skeans, PA-C [address] [place], [place] [address]  Chief complaint:  RLQ pain HPI: 51 yr F here with c/o chronic intermittent RLQ abd pain. certain foods bring on the pain, only tolerates rice, any other grain bothers her. Dairy and sugar makes it worse including some spices. Some fruits and vegetables as well. About 5 years had a severe episode, saw Ob gyn, did ultrasound that was alright. On scale about 3-4 on worse on some days, most days a low abdominal pain. Also has intense bloating and excessive flatus that is fowl smelling.  Denies any nausea, vomiting, melena or bright red blood per rectum She had multiple antibiotics for treatment of sinus infection and UTI    Outpatient Encounter Prescriptions as of 12/04/2015  Medication Sig  . carisoprodol (SOMA) 350 MG tablet TAKE 1 TABLET BY MOUTH EVERY 8 HOURS AS NEEDED FOR MUSCLE SPASMS  . levothyroxine (SYNTHROID, LEVOTHROID) 75 MCG tablet Take 1 tablet (75 mcg total) by mouth daily before breakfast.  . temazepam (RESTORIL) 30 MG capsule TAKE ONE CAPSULE BY MOUTH AS NEEDED FOR SLEEP  . triamcinolone cream (KENALOG) 0.1 % Apply 1 application topically 4 (four) times daily as needed.   No facility-administered encounter medications on file as of 12/04/2015.     Allergies as of 12/04/2015 - Review Complete 12/04/2015  Allergen Reaction Noted  . Hydrocodone  01/13/2007  . Penicillins Swelling 01/13/2007    Past Medical History:  Diagnosis Date  . Allergy   . Anxiety   . Arthritis   . Thyroid disease     Past Surgical History:  Procedure Laterality Date  . ANKLE ARTHROSCOPY Right 2000  . KNEE ARTHROSCOPY Left 1988  . KNEE ARTHROSCOPY Right 1999  . LAPAROSCOPY  2001  . NASAL POLYP SURGERY  2006  . TONSILLECTOMY AND ADENOIDECTOMY  1981    Family History  Problem  Relation Age of Onset  . Cancer Maternal Grandmother   . Hyperlipidemia Paternal Grandfather   . Miscarriages / Stillbirths Paternal Grandfather   . Heart disease Paternal Grandfather   . Clotting disorder Mother   . Kidney disease Maternal Grandfather     Social History   Social History  . Marital status: Married    Spouse name: N/A  . Number of children: N/A  . Years of education: N/A   Occupational History  . Not on file.   Social History Main Topics  . Smoking status: Never Smoker  . Smokeless tobacco: Never Used  . Alcohol use No  . Drug use: No  . Sexual activity: Not on file   Other Topics Concern  . Not on file   Social History Narrative  . No narrative on file      Review of systems: Review of Systems  Constitutional: Negative for fever and chills.  HENT: Negative.   Eyes: Negative for blurred vision.  Respiratory: Negative for cough, shortness of breath and wheezing.   Cardiovascular: Negative for chest pain and palpitations.  Gastrointestinal: as per HPI Genitourinary: Negative for dysuria, urgency, frequency and hematuria.  Musculoskeletal: Negative for myalgias, back pain and joint pain.  Skin: Negative for itching and rash.  Neurological: Negative for dizziness, tremors, focal weakness, seizures and loss of consciousness.  Endo/Heme/Allergies: Positive for seasonal allergies.  Psychiatric/Behavioral: Negative for depression, suicidal ideas and hallucinations.  All other systems reviewed and are negative.   Physical Exam: Vitals:   12/04/15 0832  BP: 110/70  Pulse: 80   Body mass index is 30.55 kg/m. Gen:      No acute distress HEENT:  EOMI, sclera anicteric Neck:     No masses; no thyromegaly Lungs:    Clear to auscultation bilaterally; normal respiratory effort CV:         Regular rate and rhythm; no murmurs Abd:      + bowel sounds; soft, non-tender; no palpable masses, no distension Ext:    No edema; adequate peripheral  perfusion Skin:      Warm and dry; no rash Neuro: alert and oriented x 3 Psych: normal mood and affect  Data Reviewed:  Reviewed labs, radiologic imaging, old records and pertinent past GI work up   Assessment and Plan/Recommendations:  6 yr F with intermittent RLQ pain associated with excessive bloating and flatus Celiac panel negative Will follow up results of abdominal ultrasound requested by PMD Start Probiotic VSL# 3 1 capsule daily Trial of Low FODMAP diet Will reevaluate in 6-8 weeks, if persistent symptoms will consider endoscopic evaluation or further imaging.  Greater than 50% of the time used for counseling as well as treatment plan and follow-up. She had multiple questions which were answered to her satisfaction  K. Denzil Magnuson , MD [phone] Mon-Fri 8a-5p [phone] after 5p, weekends, holidays  CC: Starlyn Skeans, PA-C

## 2015-12-11 DIAGNOSIS — H04123 Dry eye syndrome of bilateral lacrimal glands: Secondary | ICD-10-CM | POA: Diagnosis not present

## 2015-12-11 DIAGNOSIS — H353131 Nonexudative age-related macular degeneration, bilateral, early dry stage: Secondary | ICD-10-CM | POA: Diagnosis not present

## 2015-12-12 ENCOUNTER — Other Ambulatory Visit: Payer: Self-pay

## 2015-12-12 ENCOUNTER — Ambulatory Visit
Admission: RE | Admit: 2015-12-12 | Discharge: 2015-12-12 | Disposition: A | Discharge status: Home / Self Care | Payer: BLUE CROSS/BLUE SHIELD | Source: Ambulatory Visit | Attending: Internal Medicine | Admitting: Internal Medicine

## 2015-12-12 DIAGNOSIS — R1011 Right upper quadrant pain: Secondary | ICD-10-CM

## 2015-12-13 DIAGNOSIS — M25551 Pain in right hip: Secondary | ICD-10-CM | POA: Diagnosis not present

## 2015-12-13 DIAGNOSIS — M9906 Segmental and somatic dysfunction of lower extremity: Secondary | ICD-10-CM | POA: Diagnosis not present

## 2015-12-13 DIAGNOSIS — M791 Myalgia: Secondary | ICD-10-CM | POA: Diagnosis not present

## 2015-12-13 DIAGNOSIS — M9901 Segmental and somatic dysfunction of cervical region: Secondary | ICD-10-CM | POA: Diagnosis not present

## 2015-12-13 DIAGNOSIS — M9904 Segmental and somatic dysfunction of sacral region: Secondary | ICD-10-CM | POA: Diagnosis not present

## 2015-12-13 DIAGNOSIS — M542 Cervicalgia: Secondary | ICD-10-CM | POA: Diagnosis not present

## 2015-12-21 DIAGNOSIS — M9904 Segmental and somatic dysfunction of sacral region: Secondary | ICD-10-CM | POA: Diagnosis not present

## 2015-12-21 DIAGNOSIS — M9901 Segmental and somatic dysfunction of cervical region: Secondary | ICD-10-CM | POA: Diagnosis not present

## 2015-12-21 DIAGNOSIS — M9906 Segmental and somatic dysfunction of lower extremity: Secondary | ICD-10-CM | POA: Diagnosis not present

## 2015-12-21 DIAGNOSIS — M791 Myalgia: Secondary | ICD-10-CM | POA: Diagnosis not present

## 2016-01-01 DIAGNOSIS — M25551 Pain in right hip: Secondary | ICD-10-CM | POA: Diagnosis not present

## 2016-01-01 DIAGNOSIS — M542 Cervicalgia: Secondary | ICD-10-CM | POA: Diagnosis not present

## 2016-01-07 ENCOUNTER — Encounter (INDEPENDENT_AMBULATORY_CARE_PROVIDER_SITE_OTHER): Payer: Self-pay

## 2016-01-07 DIAGNOSIS — M542 Cervicalgia: Secondary | ICD-10-CM | POA: Diagnosis not present

## 2016-01-07 DIAGNOSIS — M25551 Pain in right hip: Secondary | ICD-10-CM | POA: Diagnosis not present

## 2016-01-17 ENCOUNTER — Ambulatory Visit (INDEPENDENT_AMBULATORY_CARE_PROVIDER_SITE_OTHER): Payer: BLUE CROSS/BLUE SHIELD | Admitting: Internal Medicine

## 2016-01-17 ENCOUNTER — Encounter: Payer: Self-pay | Admitting: Internal Medicine

## 2016-01-17 VITALS — BP 116/70 | HR 66 | Temp 98.2°F | Ht 61.25 in | Wt 152.0 lb

## 2016-01-17 DIAGNOSIS — E039 Hypothyroidism, unspecified: Secondary | ICD-10-CM

## 2016-01-17 DIAGNOSIS — K589 Irritable bowel syndrome without diarrhea: Secondary | ICD-10-CM

## 2016-01-17 DIAGNOSIS — Z79899 Other long term (current) drug therapy: Secondary | ICD-10-CM | POA: Diagnosis not present

## 2016-01-17 DIAGNOSIS — Z23 Encounter for immunization: Secondary | ICD-10-CM | POA: Diagnosis not present

## 2016-01-17 LAB — HEPATIC FUNCTION PANEL
ALT: 12 U/L (ref 6–29)
AST: 17 U/L (ref 10–30)
Albumin: 4 g/dL (ref 3.6–5.1)
Alkaline Phosphatase: 39 U/L (ref 33–115)
Bilirubin, Direct: 0.2 mg/dL (ref <=0.2)
Indirect Bilirubin: 0.5 mg/dL (ref 0.2–1.2)
Total Bilirubin: 0.7 mg/dL (ref 0.2–1.2)
Total Protein: 6.1 g/dL (ref 6.1–8.1)

## 2016-01-17 LAB — CBC WITH DIFFERENTIAL/PLATELET
Basophils Absolute: 72 cells/uL (ref 0–200)
Basophils Relative: 1 %
Eosinophils Absolute: 1728 cells/uL — ABNORMAL HIGH (ref 15–500)
Eosinophils Relative: 24 %
HCT: 37.9 % (ref 35.0–45.0)
Hemoglobin: 12.2 g/dL (ref 11.7–15.5)
Lymphocytes Relative: 30 %
Lymphs Abs: 2160 cells/uL (ref 850–3900)
MCH: 31.4 pg (ref 27.0–33.0)
MCHC: 32.2 g/dL (ref 32.0–36.0)
MCV: 97.4 fL (ref 80.0–100.0)
MPV: 8.9 fL (ref 7.5–12.5)
Monocytes Absolute: 648 cells/uL (ref 200–950)
Monocytes Relative: 9 %
Neutro Abs: 2592 cells/uL (ref 1500–7800)
Neutrophils Relative %: 36 %
Platelets: 337 10*3/uL (ref 140–400)
RBC: 3.89 MIL/uL (ref 3.80–5.10)
RDW: 14.5 % (ref 11.0–15.0)
WBC: 7.2 10*3/uL (ref 3.8–10.8)

## 2016-01-17 LAB — BASIC METABOLIC PANEL WITH GFR
BUN: 9 mg/dL (ref 7–25)
CO2: 22 mmol/L (ref 20–31)
Calcium: 9 mg/dL (ref 8.6–10.2)
Chloride: 104 mmol/L (ref 98–110)
Creat: 0.82 mg/dL (ref 0.50–1.10)
GFR, Est African American: >89 mL/min (ref >=60)
GFR, Est Non African American: 89 mL/min (ref >=60)
Glucose, Bld: 90 mg/dL (ref 65–99)
Potassium: 4.2 mmol/L (ref 3.5–5.3)
Sodium: 138 mmol/L (ref 135–146)

## 2016-01-17 LAB — TSH: TSH: 0.22 mIU/L — ABNORMAL LOW

## 2016-01-17 MED ORDER — TEMAZEPAM 30 MG PO CAPS: ORAL_CAPSULE | ORAL | 0 refills | Status: DC

## 2016-01-17 MED ORDER — PREGABALIN 50 MG PO CAPS: 50.0000 mg | ORAL_CAPSULE | Freq: Every day | ORAL | 2 refills | Status: DC

## 2016-01-17 NOTE — Progress Notes (Signed)
Assessment and Plan:  Hypertension:  -Continue medication,  -monitor blood pressure at home.  -Continue DASH diet.   -Reminder to go to the ER if any CP, SOB, nausea, dizziness, severe HA, changes vision/speech, left arm numbness and tingling, and jaw pain.  Vitamin D Def: -check level -continue medications.   Fibromyalgia -lyrica 50 mg prn as needed -cont soma  Insomnia -cont restoril  IBS -cont FODMAP diet -send to nutrition to help with increased variety in her diet  Hypothyroidism -cont levothyroxine -TSH  Continue diet and meds as discussed. Further disposition pending results of labs.  HPI 40 y.o. female  presents for 3 month follow up with hypertension, hyperlipidemia, prediabetes and vitamin D.   Her blood pressure has been controlled at home, today their BP is BP: 116/70.   She does not workout. She denies chest pain, shortness of breath, dizziness.   She is on cholesterol medication and denies myalgias. Her cholesterol is at goal. The cholesterol last visit was:  No results found for: CHOL, HDL, LDLCALC, LDLDIRECT, TRIG, CHOLHDL   She has been working on diet and exercise for prediabetes, and denies foot ulcerations, hyperglycemia, hypoglycemia , increased appetite, nausea, paresthesia of the feet, polydipsia, polyuria, visual disturbances, vomiting and weight loss. Last A1C in the office was:  Lab Results  Component Value Date   HGBA1C 5.2 10/12/2015    Patient is on Vitamin D supplement. No results found for: VD25OH    Patient has been following with gastroenterology for her digestive issues.  She is taking a probiotic and has also started the FODMAP diet.  She reports that she feels like her appetite has decreased a little bit.  She reports that she is doing a little bit better.  She reports that she is doing a little bit better.  She reports that she is definitely seeing that foods are definitively affecting it.    She is still taking her thyroid medication.   She takes it first thing in the morning on an empty stomach.    She has tried taking the lyrica at home.  She reports that she still has lots of muscle issues and pain with her "fibromyalgia".  She felt like the 50 mg did help a little bit.  She didn't like the 75 mg tablets.  She would like to have a prescription to take as needed.     Current Medications:  Current Outpatient Prescriptions on File Prior to Visit  Medication Sig Dispense Refill  . carisoprodol (SOMA) 350 MG tablet TAKE 1 TABLET BY MOUTH EVERY 8 HOURS AS NEEDED FOR MUSCLE SPASMS 30 tablet 0  . levothyroxine (SYNTHROID, LEVOTHROID) 75 MCG tablet Take 1 tablet (75 mcg total) by mouth daily before breakfast. 90 tablet 1  . temazepam (RESTORIL) 30 MG capsule TAKE ONE CAPSULE BY MOUTH AS NEEDED FOR SLEEP 30 capsule 0  . triamcinolone cream (KENALOG) 0.1 % Apply 1 application topically 4 (four) times daily as needed. 30 g 0   No current facility-administered medications on file prior to visit.     Medical History:  Past Medical History:  Diagnosis Date  . Allergy   . Anxiety   . Arthritis   . Thyroid disease     Allergies:  Allergies  Allergen Reactions  . Hydrocodone   . Penicillins Swelling     Review of Systems:  Review of Systems  Constitutional: Positive for malaise/fatigue. Negative for chills and fever.  HENT: Negative for congestion, ear pain and sore throat.  Eyes: Negative.   Respiratory: Negative for cough, shortness of breath and wheezing.   Cardiovascular: Negative for chest pain, palpitations and leg swelling.  Gastrointestinal: Positive for abdominal pain. Negative for blood in stool, constipation, diarrhea, heartburn and melena.  Genitourinary: Negative.   Skin: Negative.   Neurological: Negative for dizziness, sensory change, loss of consciousness and headaches.  Psychiatric/Behavioral: Negative for depression. The patient is nervous/anxious. The patient does not have insomnia.     Family  history- Review and unchanged  Social history- Review and unchanged  Physical Exam: BP 116/70   Pulse 66   Temp 98.2 F (36.8 C) (Temporal)   Ht 5' 1.25" (1.556 m)   Wt 152 lb (68.9 kg)   BMI 28.49 kg/m  Wt Readings from Last 3 Encounters:  01/17/16 152 lb (68.9 kg)  12/04/15 163 lb (73.9 kg)  10/12/15 162 lb (73.5 kg)    General Appearance: Well nourished well developed, in no apparent distress. Eyes: PERRLA, EOMs, conjunctiva no swelling or erythema ENT/Mouth: Ear canals normal without obstruction, swelling, erythma, discharge.  TMs normal bilaterally.  Oropharynx moist, clear, without exudate, or postoropharyngeal swelling. Neck: Supple, thyroid normal,no cervical adenopathy  Respiratory: Respiratory effort normal, Breath sounds clear A&P without rhonchi, wheeze, or rale.  No retractions, no accessory usage. Cardio: RRR with no MRGs. Brisk peripheral pulses without edema.  Abdomen: Soft, + BS,  Non tender, no guarding, rebound, hernias, masses. Musculoskeletal: Full ROM, 5/5 strength, Normal gait Skin: Warm, dry without rashes, lesions, ecchymosis.  Neuro: Awake and oriented X 3, Cranial nerves intact. Normal muscle tone, no cerebellar symptoms. Psych: Normal affect, Insight and Judgment appropriate.    Starlyn Skeans, PA-C 9:54 AM [facility]

## 2016-01-22 DIAGNOSIS — M542 Cervicalgia: Secondary | ICD-10-CM | POA: Diagnosis not present

## 2016-01-22 DIAGNOSIS — M25551 Pain in right hip: Secondary | ICD-10-CM | POA: Diagnosis not present

## 2016-01-23 DIAGNOSIS — M9906 Segmental and somatic dysfunction of lower extremity: Secondary | ICD-10-CM | POA: Diagnosis not present

## 2016-01-23 DIAGNOSIS — M791 Myalgia: Secondary | ICD-10-CM | POA: Diagnosis not present

## 2016-01-23 DIAGNOSIS — M9901 Segmental and somatic dysfunction of cervical region: Secondary | ICD-10-CM | POA: Diagnosis not present

## 2016-01-23 DIAGNOSIS — M9904 Segmental and somatic dysfunction of sacral region: Secondary | ICD-10-CM | POA: Diagnosis not present

## 2016-02-18 ENCOUNTER — Ambulatory Visit (INDEPENDENT_AMBULATORY_CARE_PROVIDER_SITE_OTHER): Payer: BLUE CROSS/BLUE SHIELD | Admitting: Gastroenterology

## 2016-02-18 ENCOUNTER — Encounter: Payer: Self-pay | Admitting: Gastroenterology

## 2016-02-18 VITALS — BP 110/62 | HR 80 | Ht 61.0 in | Wt 157.2 lb

## 2016-02-18 DIAGNOSIS — K588 Other irritable bowel syndrome: Secondary | ICD-10-CM

## 2016-02-18 DIAGNOSIS — R143 Flatulence: Secondary | ICD-10-CM | POA: Diagnosis not present

## 2016-02-18 DIAGNOSIS — R140 Abdominal distension (gaseous): Secondary | ICD-10-CM | POA: Diagnosis not present

## 2016-02-18 DIAGNOSIS — R1031 Right lower quadrant pain: Secondary | ICD-10-CM

## 2016-02-18 DIAGNOSIS — G8929 Other chronic pain: Secondary | ICD-10-CM

## 2016-02-18 MED ORDER — RIFAXIMIN 550 MG PO TABS: 550.0000 mg | ORAL_TABLET | Freq: Three times a day (TID) | ORAL | 0 refills | Status: DC

## 2016-02-18 NOTE — Progress Notes (Signed)
Charlotte Wise    ZK:[id number]    1975-07-03  Primary Care Physician:MCKEOWN,WILLIAM DAVID, MD  Referring Physician: Unk Pinto, MD [address] [place], [place] [address]  Chief complaint:  IBS, RLQ pain, foul smelling flatus and stool  HPI: 40 yr F here with c/o chronic intermittent RLQ abd pain. She was last seen in office in September 2017 with similar complaints . She has tried low fodmap diet with improvement, slowly added back all the foods after 8 weeks of low fodmap diet and she feels her symptoms have recurred now. She complains of foul-smelling flatus and stool associated with right lower quadrant pain. This is been going on for past 7-8 years. She had undergone laparoscopy about 20 years ago for similar complaints to exclude endometriosis.  On a scale about 3-4 pain on worse days, most of the days a very mild abdominal pain in RLQ.   Denies any nausea, vomiting, constipation, diarrhea, melena or bright red blood per rectum. She has regular daily bowel movements Certain foods bring on the pain, only tolerates rice, any other grain bothers her. Dairy, sugar and tomatoes makes it worse including all spices. Some fruits and vegetables as well. About 5 years ago had a severe episode, saw Ob gyn, did ultrasound that was alright. She had multiple antibiotics for treatment of sinus infection and UTI in the past and she usually gets an yeast infection after antibiotic use per patient    Outpatient Encounter Prescriptions as of 02/18/2016  Medication Sig  . carisoprodol (SOMA) 350 MG tablet TAKE 1 TABLET BY MOUTH EVERY 8 HOURS AS NEEDED FOR MUSCLE SPASMS  . levothyroxine (SYNTHROID, LEVOTHROID) 75 MCG tablet Take 1 tablet (75 mcg total) by mouth daily before breakfast.  . pregabalin (LYRICA) 50 MG capsule Take 1 capsule (50 mg total) by mouth daily.  . temazepam (RESTORIL) 30 MG capsule TAKE ONE CAPSULE BY MOUTH AS NEEDED FOR SLEEP  . triamcinolone cream  (KENALOG) 0.1 % Apply 1 application topically 4 (four) times daily as needed.   No facility-administered encounter medications on file as of 02/18/2016.     Allergies as of 02/18/2016 - Review Complete 02/18/2016  Allergen Reaction Noted  . Hydrocodone  01/13/2007  . Penicillins Swelling 01/13/2007    Past Medical History:  Diagnosis Date  . Allergy   . Anxiety   . Arthritis   . Thyroid disease     Past Surgical History:  Procedure Laterality Date  . ANKLE ARTHROSCOPY Right 2000  . KNEE ARTHROSCOPY Left 1988  . KNEE ARTHROSCOPY Right 1999  . LAPAROSCOPY  2001  . NASAL POLYP SURGERY  2006  . TONSILLECTOMY AND ADENOIDECTOMY  1981    Family History  Problem Relation Age of Onset  . Cancer Maternal Grandmother   . Hyperlipidemia Paternal Grandfather   . Miscarriages / Stillbirths Paternal Grandfather   . Heart disease Paternal Grandfather   . Clotting disorder Mother   . Kidney disease Maternal Grandfather     Social History   Social History  . Marital status: Married    Spouse name: N/A  . Number of children: N/A  . Years of education: N/A   Occupational History  . Not on file.   Social History Main Topics  . Smoking status: Never Smoker  . Smokeless tobacco: Never Used  . Alcohol use No  . Drug use: No  . Sexual activity: Not on file   Other Topics Concern  .  Not on file   Social History Narrative  . No narrative on file      Review of systems: Review of Systems  Constitutional: Negative for fever and chills.  HENT: Negative.   Eyes: Negative for blurred vision.  Respiratory: Negative for cough, shortness of breath and wheezing.   Cardiovascular: Negative for chest pain and palpitations.  Gastrointestinal: as per HPI Genitourinary: Negative for dysuria, urgency, frequency and hematuria.  Musculoskeletal: Positive for myalgias, back pain and joint pain.  Skin: Negative for itching and rash.  Neurological: Negative for dizziness, tremors, focal  weakness, seizures and loss of consciousness.  Endo/Heme/Allergies: Positive for seasonal allergies.  Psychiatric/Behavioral: Negative for depression, suicidal ideas and hallucinations.  All other systems reviewed and are negative.   Physical Exam: Vitals:   02/18/16 1001  BP: 110/62  Pulse: 80   Body mass index is 29.71 kg/m. Gen:      No acute distress HEENT:  EOMI, sclera anicteric Neck:     No masses; no thyromegaly Lungs:    Clear to auscultation bilaterally; normal respiratory effort CV:         Regular rate and rhythm; no murmurs Abd:      + bowel sounds; soft, non-tender; no palpable masses, no distension Ext:    No edema; adequate peripheral perfusion Skin:      Warm and dry; no rash Neuro: alert and oriented x 3 Psych: normal mood and affect  Data Reviewed:  Reviewed labs, radiology imaging, old records and pertinent past GI work up Abdominal ultrasound 12/12/2015 1. No gallstones or biliary distention.  2. Liver is echogenic consistent with fatty infiltration and/or hepatocellular disease.  Assessment and Plan/Recommendations: 40 year old female with history of chronic intermittent right lower quadrant pain for past 10-20 years here for follow-up visit Excessive bloating and foul smelling flatus, she may have a competent of small intestinal bacterial overgrowth. We'll do a course of rifaximin 550 mg 3 times daily for 2 weeks Continue low forte map diet Restart probiotics after completion of rifaximin Discussed in detail with patient that given the chronicity of her symptoms and unrevealing workup for any significant pathology based on labs and imaging, endoscopic evaluation at this point will likely be very low yield  25 minutes was spent face-to-face with the patient. Greater than 50% of the time used for counseling as well as treatment plan and follow-up. She had multiple questions which were answered to her satisfaction  K. Denzil Magnuson , MD [phone]  Mon-Fri 8a-5p [phone] after 5p, weekends, holidays  CC: Unk Pinto, MD

## 2016-02-18 NOTE — Patient Instructions (Addendum)
We have sent in your prescription to your pharmacy    Continue the [place] given previously  Start probiotic after completion of antibiotic

## 2016-02-20 ENCOUNTER — Telehealth: Payer: Self-pay | Admitting: *Deleted

## 2016-02-20 NOTE — Telephone Encounter (Signed)
Insurance will not cover xifaxian, called patient today to pick up samples she will be here tomorrow

## 2016-04-14 ENCOUNTER — Encounter (HOSPITAL_COMMUNITY): Payer: Self-pay | Admitting: *Deleted

## 2016-04-14 ENCOUNTER — Emergency Department (HOSPITAL_COMMUNITY)
Admission: EM | Admit: 2016-04-14 | Discharge: 2016-04-15 | Disposition: A | Discharge status: Other Acute Inpatient Hospital | Payer: BLUE CROSS/BLUE SHIELD | Attending: Emergency Medicine | Admitting: Emergency Medicine

## 2016-04-14 DIAGNOSIS — R443 Hallucinations, unspecified: Secondary | ICD-10-CM | POA: Diagnosis present

## 2016-04-14 DIAGNOSIS — Z79899 Other long term (current) drug therapy: Secondary | ICD-10-CM | POA: Insufficient documentation

## 2016-04-14 DIAGNOSIS — F419 Anxiety disorder, unspecified: Secondary | ICD-10-CM | POA: Diagnosis not present

## 2016-04-14 DIAGNOSIS — F418 Other specified anxiety disorders: Secondary | ICD-10-CM | POA: Diagnosis not present

## 2016-04-14 DIAGNOSIS — F69 Unspecified disorder of adult personality and behavior: Secondary | ICD-10-CM | POA: Diagnosis not present

## 2016-04-14 DIAGNOSIS — F339 Major depressive disorder, recurrent, unspecified: Secondary | ICD-10-CM | POA: Insufficient documentation

## 2016-04-14 DIAGNOSIS — F333 Major depressive disorder, recurrent, severe with psychotic symptoms: Secondary | ICD-10-CM | POA: Diagnosis not present

## 2016-04-14 DIAGNOSIS — Z8249 Family history of ischemic heart disease and other diseases of the circulatory system: Secondary | ICD-10-CM | POA: Diagnosis not present

## 2016-04-14 DIAGNOSIS — Z9889 Other specified postprocedural states: Secondary | ICD-10-CM | POA: Diagnosis not present

## 2016-04-14 DIAGNOSIS — Z808 Family history of malignant neoplasm of other organs or systems: Secondary | ICD-10-CM | POA: Diagnosis not present

## 2016-04-14 HISTORY — DX: Major depressive disorder, recurrent, unspecified: F33.9

## 2016-04-14 LAB — RAPID URINE DRUG SCREEN, HOSP PERFORMED
Amphetamines: NOT DETECTED
Barbiturates: NOT DETECTED
Benzodiazepines: POSITIVE — AB
Cocaine: NOT DETECTED
Opiates: NOT DETECTED
Tetrahydrocannabinol: NOT DETECTED

## 2016-04-14 LAB — COMPREHENSIVE METABOLIC PANEL
ALT: 29 U/L (ref 14–54)
AST: 62 U/L — ABNORMAL HIGH (ref 15–41)
Albumin: 5.1 g/dL — ABNORMAL HIGH (ref 3.5–5.0)
Alkaline Phosphatase: 45 U/L (ref 38–126)
Anion gap: 12 (ref 5–15)
BUN: 12 mg/dL (ref 6–20)
CO2: 22 mmol/L (ref 22–32)
Calcium: 9.3 mg/dL (ref 8.9–10.3)
Chloride: 103 mmol/L (ref 101–111)
Creatinine, Ser: 0.78 mg/dL (ref 0.44–1.00)
GFR calc Af Amer: >60 mL/min (ref >60)
GFR calc non Af Amer: >60 mL/min (ref >60)
Glucose, Bld: 92 mg/dL (ref 65–99)
Potassium: 3.5 mmol/L (ref 3.5–5.1)
Sodium: 137 mmol/L (ref 135–145)
Total Bilirubin: 1.2 mg/dL (ref 0.3–1.2)
Total Protein: 7.5 g/dL (ref 6.5–8.1)

## 2016-04-14 LAB — CBC
HCT: 37.6 % (ref 36.0–46.0)
Hemoglobin: 13 g/dL (ref 12.0–15.0)
MCH: 32.7 pg (ref 26.0–34.0)
MCHC: 34.6 g/dL (ref 30.0–36.0)
MCV: 94.7 fL (ref 78.0–100.0)
Platelets: 346 10*3/uL (ref 150–400)
RBC: 3.97 MIL/uL (ref 3.87–5.11)
RDW: 13.3 % (ref 11.5–15.5)
WBC: 8.5 10*3/uL (ref 4.0–10.5)

## 2016-04-14 LAB — SALICYLATE LEVEL: Salicylate Lvl: <7 mg/dL (ref 2.8–30.0)

## 2016-04-14 LAB — ETHANOL: Alcohol, Ethyl (B): <5 mg/dL (ref <5)

## 2016-04-14 LAB — ACETAMINOPHEN LEVEL: Acetaminophen (Tylenol), Serum: <10 ug/mL — ABNORMAL LOW (ref 10–30)

## 2016-04-14 MED ORDER — LEVOTHYROXINE SODIUM 75 MCG PO TABS
75.0000 ug | ORAL_TABLET | Freq: Every day | ORAL | Status: DC
  Administered 2016-04-15: 75 ug via ORAL
  Filled 2016-04-14: qty 1

## 2016-04-14 MED ORDER — TEMAZEPAM 15 MG PO CAPS: 30.0000 mg | ORAL_CAPSULE | Freq: Every evening | ORAL | Status: DC | PRN

## 2016-04-14 MED ORDER — PREGABALIN 50 MG PO CAPS
50.0000 mg | ORAL_CAPSULE | Freq: Every day | ORAL | Status: DC
  Administered 2016-04-14: 50 mg via ORAL
  Filled 2016-04-14: qty 1

## 2016-04-14 MED ORDER — ADULT MULTIVITAMIN W/MINERALS CH
1.0000 | ORAL_TABLET | Freq: Every day | ORAL | Status: DC
  Administered 2016-04-14 – 2016-04-15 (×2): 1 via ORAL
  Filled 2016-04-14 (×2): qty 1

## 2016-04-14 NOTE — ED Notes (Signed)
SBAR Report received from previous nurse. Pt received calm and visible on unit. Pt denies current SI/ HI, A/V H, depression, anxiety, or pain at this time, and appears otherwise stable and free of distress. Pt gave Probation officer only 1 word answers to assessment questions. Pt reminded of camera surveillance, q 15 min rounds, and rules of the milieu. Will continue to assess.

## 2016-04-14 NOTE — ED Notes (Signed)
Patient has two bags of belongings in the cabinet at the nurses station for 9-25.
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
## 2016-04-14 NOTE — ED Notes (Signed)
Pt sitting in bed with legs crossed and arms and hands in the praying position, chanting.

## 2016-04-14 NOTE — ED Notes (Signed)
Introduced self to patient. Pt oriented to unit expectations.  Assessed pt for:  A) Anxiety &/or agitation: On admission to the [facility] pt appears anxious, depressed and guarded. She requested water and a blanket. She gave one-word answers to questions and indicated therefore that she has thoughts of hurting others.  S) Safety: Safety maintained with q-15-minute checks and hourly rounds by staff.  A) ADLs: Pt able to perform ADLs independently.  P) Pick-Up (room cleanliness): Pt's room clean and free of clutter.
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
## 2016-04-14 NOTE — ED Provider Notes (Signed)
[facility] DEPT Provider Note   CSN: [id number] Arrival date & time: 04/14/16  1216     History   Chief Complaint Chief Complaint  Patient presents with  . Medical Clearance    IVC    HPI FALLEN DOHNER is a 41 y.o. female.  The history is provided by the patient and medical records.     41 year old female with history of seasonal allergies, anxiety, arthritis, thyroid disease, history of postpartum depression, presenting to the ED under IVC petition by patient's husband. Patient reports over the past few days she has felt that some type of being or spirit has overtaken her mind and body and is compelling her to think and behave abnormally.  States she has had thoughts of burning her house down, giving people her credit card to induce financial burden on her family, and has had fleeting thoughts of wanting to hurt her son and her husband. Patient states deep down she knows she does not want to do these things, however she does not feel she can control her behaviors any longer. Husband reports yesterday was the worst. States she was acting very bizarre and took off walking down the street. Patient states she got in her head that she needed to walk to [place] so she packed her backpack and started walking down [address]. Patient walked for nearly 7-8 hours yesterday, her husband picked her up around 10 PM. She had not eaten, use the bathroom, sweats, or rested the entire time. States she has not slept in about 2 days. Patient reports generally she is very calm, she does take herbal supplements and meditates. She's not had any recent traumatic event. No changes in her home medications. Does report history of postpartum depression, last occurrence was 12 years ago after the birth of her son.  States she has not required medications for depression in the past. She denies any drug or alcohol abuse. No falls or head injury.  Past Medical History:  Diagnosis Date  . Allergy   .  Anxiety   . Arthritis   . Thyroid disease     Patient Active Problem List   Diagnosis Date Noted  . Allergic rhinitis 05/05/2007  . HYPERSOMNIA, IDIOPATHIC 01/13/2007    Past Surgical History:  Procedure Laterality Date  . ANKLE ARTHROSCOPY Right 2000  . KNEE ARTHROSCOPY Left 1988  . KNEE ARTHROSCOPY Right 1999  . LAPAROSCOPY  2001  . NASAL POLYP SURGERY  2006  . TONSILLECTOMY AND ADENOIDECTOMY  1981    OB History    No data available       Home Medications    Prior to Admission medications   Medication Sig Start Date End Date Taking? Authorizing Provider  levothyroxine (SYNTHROID, LEVOTHROID) 75 MCG tablet Take 1 tablet (75 mcg total) by mouth daily before breakfast. 10/15/15  Yes Courtney Forcucci, PA-C  Multiple Vitamin (MULTIVITAMIN WITH MINERALS) TABS tablet Take 1 tablet by mouth daily.   Yes Historical Provider, MD  pregabalin (LYRICA) 50 MG capsule Take 1 capsule (50 mg total) by mouth daily. 01/17/16 01/16/17 Yes Courtney Forcucci, PA-C  temazepam (RESTORIL) 30 MG capsule TAKE ONE CAPSULE BY MOUTH AS NEEDED FOR SLEEP 01/17/16  Yes Courtney Forcucci, PA-C  carisoprodol (SOMA) 350 MG tablet TAKE 1 TABLET BY MOUTH EVERY 8 HOURS AS NEEDED FOR MUSCLE SPASMS Patient not taking: Reported on 04/14/2016 10/15/15   Unk Pinto, MD  rifaximin (XIFAXAN) 550 MG TABS tablet Take 1 tablet (550 mg total) by mouth 3 (three)  times daily. Patient not taking: Reported on 04/14/2016 02/18/16   Mauri Pole, MD  triamcinolone cream (KENALOG) 0.1 % Apply 1 application topically 4 (four) times daily as needed. Patient not taking: Reported on 04/14/2016 10/12/15   Starlyn Skeans, PA-C    Family History Family History  Problem Relation Age of Onset  . Cancer Maternal Grandmother   . Hyperlipidemia Paternal Grandfather   . Miscarriages / Stillbirths Paternal Grandfather   . Heart disease Paternal Grandfather   . Clotting disorder Mother   . Kidney disease Maternal Grandfather      Social History Social History  Substance Use Topics  . Smoking status: Never Smoker  . Smokeless tobacco: Never Used  . Alcohol use No     Allergies   Hydrocodone and Penicillins   Review of Systems Review of Systems  Psychiatric/Behavioral: Positive for behavioral problems, hallucinations and sleep disturbance. The patient is hyperactive.   All other systems reviewed and are negative.    Physical Exam Updated Vital Signs BP 167/89 (BP Location: Left Arm)   Pulse 87   Temp 98.1 F (36.7 C) (Oral)   Resp 16   SpO2 98%   Physical Exam  Constitutional: She is oriented to person, place, and time. She appears well-developed and well-nourished.  HENT:  Head: Normocephalic and atraumatic.  Mouth/Throat: Oropharynx is clear and moist.  Eyes: Conjunctivae and EOM are normal. Pupils are equal, round, and reactive to light.  Neck: Normal range of motion.  Cardiovascular: Normal rate, regular rhythm and normal heart sounds.   Pulmonary/Chest: Effort normal and breath sounds normal. No respiratory distress. She has no wheezes. She has no rales.  Abdominal: Soft. Bowel sounds are normal. There is no tenderness. There is no rebound.  Musculoskeletal: Normal range of motion.  Neurological: She is alert and oriented to person, place, and time.  Skin: Skin is warm and dry.  Psychiatric:  Blunt mood with abnormal affect, speech is clear, she seems to have insight into her current state  Nursing note and vitals reviewed.    ED Treatments / Results  Labs (all labs ordered are listed, but only abnormal results are displayed) Labs Reviewed  COMPREHENSIVE METABOLIC PANEL - Abnormal; Notable for the following:       Result Value   Albumin 5.1 (*)    AST 62 (*)    All other components within normal limits  ACETAMINOPHEN LEVEL - Abnormal; Notable for the following:    Acetaminophen (Tylenol), Serum <10 (*)    All other components within normal limits  RAPID URINE DRUG SCREEN,  HOSP PERFORMED - Abnormal; Notable for the following:    Benzodiazepines POSITIVE (*)    All other components within normal limits  CBC  ETHANOL  SALICYLATE LEVEL    EKG  EKG Interpretation None       Radiology No results found.  Procedures Procedures (including critical care time)  Medications Ordered in ED Medications - No data to display   Initial Impression / Assessment and Plan / ED Course  I have reviewed the triage vital signs and the nursing notes.  Pertinent labs & imaging results that were available during my care of the patient were reviewed by me and considered in my medical decision making (see chart for details).  41 year old female is here under IVC petitioned by her husband. Has displayed some bizarre behavior changes for the past 2 days. States she feels "compelled by some spirit or other being" to have these thoughts and act this  way, but truly does not want to hurt herself or others.  Seems to have insight into her current state.  Lab work reassuring.  Patient medically cleared, will get TTS consult.  Home meds ordered for her.  Anticipate admission to IP facility.  Final Clinical Impressions(s) / ED Diagnoses   Final diagnoses:  Behavior concern in adult    New Prescriptions New Prescriptions   No medications on file     Larene Pickett, Hershal Coria 04/14/16 [place], MD 04/14/16 1759

## 2016-04-15 ENCOUNTER — Inpatient Hospital Stay (HOSPITAL_COMMUNITY)
Admission: AD | Admit: 2016-04-15 | Discharge: 2016-04-28 | DRG: 885 | Disposition: A | Discharge status: Home / Self Care | Payer: BLUE CROSS/BLUE SHIELD | Attending: Psychiatry | Admitting: Psychiatry

## 2016-04-15 ENCOUNTER — Encounter (HOSPITAL_COMMUNITY): Payer: Self-pay | Admitting: *Deleted

## 2016-04-15 ENCOUNTER — Encounter (HOSPITAL_COMMUNITY): Payer: Self-pay | Admitting: Psychiatry

## 2016-04-15 DIAGNOSIS — G2571 Drug induced akathisia: Secondary | ICD-10-CM | POA: Clinically undetermined

## 2016-04-15 DIAGNOSIS — Z9889 Other specified postprocedural states: Secondary | ICD-10-CM

## 2016-04-15 DIAGNOSIS — Z8249 Family history of ischemic heart disease and other diseases of the circulatory system: Secondary | ICD-10-CM | POA: Diagnosis not present

## 2016-04-15 DIAGNOSIS — F202 Catatonic schizophrenia: Secondary | ICD-10-CM | POA: Diagnosis not present

## 2016-04-15 DIAGNOSIS — Z841 Family history of disorders of kidney and ureter: Secondary | ICD-10-CM

## 2016-04-15 DIAGNOSIS — G4710 Hypersomnia, unspecified: Secondary | ICD-10-CM | POA: Diagnosis not present

## 2016-04-15 DIAGNOSIS — Z808 Family history of malignant neoplasm of other organs or systems: Secondary | ICD-10-CM | POA: Diagnosis not present

## 2016-04-15 DIAGNOSIS — Z880 Allergy status to penicillin: Secondary | ICD-10-CM

## 2016-04-15 DIAGNOSIS — F333 Major depressive disorder, recurrent, severe with psychotic symptoms: Secondary | ICD-10-CM | POA: Diagnosis present

## 2016-04-15 DIAGNOSIS — T43505A Adverse effect of unspecified antipsychotics and neuroleptics, initial encounter: Secondary | ICD-10-CM

## 2016-04-15 DIAGNOSIS — R930 Abnormal findings on diagnostic imaging of skull and head, not elsewhere classified: Secondary | ICD-10-CM | POA: Diagnosis not present

## 2016-04-15 DIAGNOSIS — G4700 Insomnia, unspecified: Secondary | ICD-10-CM | POA: Diagnosis not present

## 2016-04-15 DIAGNOSIS — Z8349 Family history of other endocrine, nutritional and metabolic diseases: Secondary | ICD-10-CM | POA: Diagnosis not present

## 2016-04-15 DIAGNOSIS — Z888 Allergy status to other drugs, medicaments and biological substances status: Secondary | ICD-10-CM

## 2016-04-15 DIAGNOSIS — J309 Allergic rhinitis, unspecified: Secondary | ICD-10-CM | POA: Diagnosis not present

## 2016-04-15 DIAGNOSIS — F339 Major depressive disorder, recurrent, unspecified: Secondary | ICD-10-CM | POA: Diagnosis not present

## 2016-04-15 DIAGNOSIS — Z8489 Family history of other specified conditions: Secondary | ICD-10-CM

## 2016-04-15 DIAGNOSIS — F2081 Schizophreniform disorder: Secondary | ICD-10-CM | POA: Diagnosis not present

## 2016-04-15 DIAGNOSIS — F69 Unspecified disorder of adult personality and behavior: Secondary | ICD-10-CM | POA: Diagnosis not present

## 2016-04-15 DIAGNOSIS — Z79899 Other long term (current) drug therapy: Secondary | ICD-10-CM | POA: Diagnosis not present

## 2016-04-15 DIAGNOSIS — F29 Unspecified psychosis not due to a substance or known physiological condition: Secondary | ICD-10-CM

## 2016-04-15 HISTORY — DX: Major depressive disorder, recurrent, unspecified: F33.9

## 2016-04-15 MED ORDER — ACETAMINOPHEN 325 MG PO TABS
650.0000 mg | ORAL_TABLET | Freq: Four times a day (QID) | ORAL | Status: DC | PRN
  Administered 2016-04-23 – 2016-04-27 (×11): 650 mg via ORAL
  Filled 2016-04-15 (×14): qty 2

## 2016-04-15 MED ORDER — LAMOTRIGINE 25 MG PO TABS
25.0000 mg | ORAL_TABLET | Freq: Every day | ORAL | Status: DC
  Filled 2016-04-15: qty 1

## 2016-04-15 MED ORDER — OLANZAPINE 10 MG IM SOLR: 5.0000 mg | Freq: Four times a day (QID) | INTRAMUSCULAR | Status: DC | PRN

## 2016-04-15 MED ORDER — LORAZEPAM 1 MG PO TABS
1.0000 mg | ORAL_TABLET | Freq: Four times a day (QID) | ORAL | Status: DC | PRN
  Administered 2016-04-22: 1 mg via ORAL
  Filled 2016-04-15 (×4): qty 1

## 2016-04-15 MED ORDER — LEVOTHYROXINE SODIUM 75 MCG PO TABS
75.0000 ug | ORAL_TABLET | Freq: Every day | ORAL | Status: DC
  Administered 2016-04-16 – 2016-04-28 (×13): 75 ug via ORAL
  Filled 2016-04-15 (×17): qty 1

## 2016-04-15 MED ORDER — ADULT MULTIVITAMIN W/MINERALS CH
1.0000 | ORAL_TABLET | Freq: Every day | ORAL | Status: DC
  Administered 2016-04-16 – 2016-04-28 (×13): 1 via ORAL
  Filled 2016-04-15 (×18): qty 1

## 2016-04-15 MED ORDER — LORAZEPAM 0.5 MG PO TABS
0.5000 mg | ORAL_TABLET | ORAL | Status: DC
  Filled 2016-04-15 (×3): qty 1

## 2016-04-15 MED ORDER — OLANZAPINE 5 MG PO TABS: 5.0000 mg | ORAL_TABLET | Freq: Four times a day (QID) | ORAL | Status: DC | PRN

## 2016-04-15 MED ORDER — TRAZODONE HCL 100 MG PO TABS
100.0000 mg | ORAL_TABLET | Freq: Every day | ORAL | Status: DC
  Filled 2016-04-15 (×9): qty 1

## 2016-04-15 MED ORDER — LAMOTRIGINE 25 MG PO TABS
25.0000 mg | ORAL_TABLET | Freq: Every day | ORAL | Status: DC
  Filled 2016-04-15 (×4): qty 1

## 2016-04-15 MED ORDER — HYDROXYZINE HCL 25 MG PO TABS
25.0000 mg | ORAL_TABLET | Freq: Three times a day (TID) | ORAL | Status: DC | PRN
  Administered 2016-04-19 – 2016-04-23 (×3): 25 mg via ORAL
  Filled 2016-04-15 (×3): qty 1

## 2016-04-15 MED ORDER — MAGNESIUM HYDROXIDE 400 MG/5ML PO SUSP: 30.0000 mL | Freq: Every day | ORAL | Status: DC | PRN

## 2016-04-15 MED ORDER — LORAZEPAM 2 MG/ML IJ SOLN: 1.0000 mg | Freq: Four times a day (QID) | INTRAMUSCULAR | Status: DC | PRN

## 2016-04-15 MED ORDER — TRAZODONE HCL 100 MG PO TABS: 100.0000 mg | ORAL_TABLET | Freq: Every day | ORAL | Status: DC

## 2016-04-15 MED ORDER — HYDROXYZINE HCL 25 MG PO TABS: 25.0000 mg | ORAL_TABLET | Freq: Three times a day (TID) | ORAL | Status: DC | PRN

## 2016-04-15 MED ORDER — ALUM & MAG HYDROXIDE-SIMETH 200-200-20 MG/5ML PO SUSP
30.0000 mL | ORAL | Status: DC | PRN
  Administered 2016-04-17: 30 mL via ORAL
  Filled 2016-04-15: qty 30
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
## 2016-04-15 NOTE — Progress Notes (Signed)
Did not attend group 
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
## 2016-04-15 NOTE — BH Assessment (Addendum)
[facility] Assessment Progress Note  Per Corena Pilgrim, MD, this pt requires psychiatric hospitalization.  Letitia Libra, RN, [facility] has assigned pt to [facility] Rm 404-1.  Pt presents under IVC initiated by pt's spouse, and upheld by Dr Darleene Cleaver, and IVC documents have been faxed to [facility].  Pt's nurse, Gerrit Friends, has been notified, and agrees to call report to [phone].  Pt is to be transported via [employer].   Jalene Mullet, [facility] Triage Specialist [phone]
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
## 2016-04-15 NOTE — Progress Notes (Signed)
Patient is being monitored 1:1 due to bizarre and disorganized behaviors.  Patient will not respond to staff and is laying in the bed with her eyes clenched tight.  Patient is free of harm, environment is noted to be safe and 1:1 staff is in close proximity to patient.
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
## 2016-04-15 NOTE — Progress Notes (Signed)
D: 1:1 staff reported pt agrees to take her medications.  Pt remains non-verbal with writer, only responding to some questions by shaking or nodding her head.    A: Medications offered to pt.  Pt took medications and took a sip of water, but refused to swallow medications.  After much encouragement from staff, pt spit the water and the dissolved medications onto herself.  Pt was making a growling noise at the time and she was clenching her teeth.  Pt remains on 1:1 for safety.    R: Pt is safe on the unit.  Will continue to monitor and assess.

## 2016-04-15 NOTE — Progress Notes (Signed)
Pt resting in bed.  Writer engaging pt in conversation. 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  Writer asked pt if she was having problems at home.  Pt nodded yes.  Writer tried to encourage pt.  While providing support pt was attentive looking at Probation officer as she spoke.  During the interaction, pt teared up as Probation officer provided support and a tear fell from right eye.
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
## 2016-04-15 NOTE — ED Notes (Signed)
Patient denies thoughts of harm to self or others.  States she felt as thought she were going to die last night and so she wrote letters to her husband telling him that she loved him.  She has been up and about on the unit all shift.  At approximately 12:30 she got into the shower and stayed there for over 2 hours.  We checked on her regularly and she would answer [place] and said she was finishing up and getting dressed, but when we would check back a few minutes later she was still sitting in the shower behind the curtain.  I finally went in with another female nurse and found her sitting naked behind the curtain.  She would not respond to me verbally and would not get herself dressed.  Jan and I put clothes on her and assisted her to her room.  VS were checked and found to be within normal range.  [employer] waited about 40 minutes for her.  She was then assisted out the the vehicle.  All belongings sent with the officers.  Call placed to [facility] with an update as to what had happened.  They agreed to meet [employer] at the door.

## 2016-04-16 ENCOUNTER — Encounter (HOSPITAL_COMMUNITY): Payer: Self-pay | Admitting: Psychiatry

## 2016-04-16 DIAGNOSIS — Z8489 Family history of other specified conditions: Secondary | ICD-10-CM

## 2016-04-16 DIAGNOSIS — Z8249 Family history of ischemic heart disease and other diseases of the circulatory system: Secondary | ICD-10-CM

## 2016-04-16 DIAGNOSIS — Z880 Allergy status to penicillin: Secondary | ICD-10-CM

## 2016-04-16 DIAGNOSIS — J309 Allergic rhinitis, unspecified: Secondary | ICD-10-CM

## 2016-04-16 DIAGNOSIS — Z888 Allergy status to other drugs, medicaments and biological substances status: Secondary | ICD-10-CM

## 2016-04-16 DIAGNOSIS — Z841 Family history of disorders of kidney and ureter: Secondary | ICD-10-CM

## 2016-04-16 DIAGNOSIS — Z809 Family history of malignant neoplasm, unspecified: Secondary | ICD-10-CM

## 2016-04-16 DIAGNOSIS — Z79899 Other long term (current) drug therapy: Secondary | ICD-10-CM

## 2016-04-16 DIAGNOSIS — F202 Catatonic schizophrenia: Principal | ICD-10-CM

## 2016-04-16 DIAGNOSIS — Z9889 Other specified postprocedural states: Secondary | ICD-10-CM

## 2016-04-16 DIAGNOSIS — G4710 Hypersomnia, unspecified: Secondary | ICD-10-CM

## 2016-04-16 LAB — LIPID PANEL
Cholesterol: 160 mg/dL (ref 0–200)
HDL: 51 mg/dL (ref >40)
LDL Cholesterol: 95 mg/dL (ref 0–99)
Total CHOL/HDL Ratio: 3.1 RATIO
Triglycerides: 71 mg/dL (ref <150)
VLDL: 14 mg/dL (ref 0–40)

## 2016-04-16 LAB — COMPREHENSIVE METABOLIC PANEL
ALT: 41 U/L (ref 14–54)
AST: 70 U/L — ABNORMAL HIGH (ref 15–41)
Albumin: 4.6 g/dL (ref 3.5–5.0)
Alkaline Phosphatase: 49 U/L (ref 38–126)
Anion gap: 13 (ref 5–15)
BUN: 21 mg/dL — ABNORMAL HIGH (ref 6–20)
CO2: 21 mmol/L — ABNORMAL LOW (ref 22–32)
Calcium: 9 mg/dL (ref 8.9–10.3)
Chloride: 103 mmol/L (ref 101–111)
Creatinine, Ser: 0.62 mg/dL (ref 0.44–1.00)
GFR calc Af Amer: >60 mL/min (ref >60)
GFR calc non Af Amer: >60 mL/min (ref >60)
Glucose, Bld: 101 mg/dL — ABNORMAL HIGH (ref 65–99)
Potassium: 4.2 mmol/L (ref 3.5–5.1)
Sodium: 137 mmol/L (ref 135–145)
Total Bilirubin: 1.2 mg/dL (ref 0.3–1.2)
Total Protein: 6.9 g/dL (ref 6.5–8.1)

## 2016-04-16 LAB — HCG, QUANTITATIVE, PREGNANCY: hCG, Beta Chain, Quant, S: <1 m[IU]/mL (ref <5)

## 2016-04-16 LAB — FOLATE: Folate: 45.4 ng/mL (ref >5.9)

## 2016-04-16 LAB — VITAMIN B12: Vitamin B-12: 2166 pg/mL — ABNORMAL HIGH (ref 180–914)

## 2016-04-16 LAB — TSH: TSH: 2.484 u[IU]/mL (ref 0.350–4.500)

## 2016-04-16 MED ORDER — ENSURE ENLIVE PO LIQD
237.0000 mL | Freq: Two times a day (BID) | ORAL | Status: DC
  Administered 2016-04-16 (×2): 237 mL via ORAL

## 2016-04-16 MED ORDER — HALOPERIDOL LACTATE 5 MG/ML IJ SOLN
5.0000 mg | Freq: Two times a day (BID) | INTRAMUSCULAR | Status: DC
  Filled 2016-04-16 (×14): qty 1

## 2016-04-16 MED ORDER — LORAZEPAM 1 MG PO TABS
1.0000 mg | ORAL_TABLET | Freq: Two times a day (BID) | ORAL | Status: DC
  Administered 2016-04-17 – 2016-04-18 (×3): 1 mg via ORAL
  Filled 2016-04-16 (×3): qty 1

## 2016-04-16 MED ORDER — ZIPRASIDONE HCL 20 MG PO CAPS
20.0000 mg | ORAL_CAPSULE | Freq: Three times a day (TID) | ORAL | Status: DC | PRN
  Administered 2016-04-23: 20 mg via ORAL
  Filled 2016-04-16 (×2): qty 1

## 2016-04-16 MED ORDER — HALOPERIDOL 5 MG PO TABS
5.0000 mg | ORAL_TABLET | Freq: Two times a day (BID) | ORAL | Status: DC
  Administered 2016-04-16 – 2016-04-22 (×12): 5 mg via ORAL
  Filled 2016-04-16 (×15): qty 1

## 2016-04-16 MED ORDER — LORAZEPAM 2 MG/ML IJ SOLN
1.0000 mg | Freq: Two times a day (BID) | INTRAMUSCULAR | Status: DC
  Administered 2016-04-16: 1 mg via INTRAMUSCULAR
  Filled 2016-04-16: qty 1

## 2016-04-16 MED ORDER — ZIPRASIDONE MESYLATE 20 MG IM SOLR: 10.0000 mg | Freq: Three times a day (TID) | INTRAMUSCULAR | Status: DC | PRN

## 2016-04-16 NOTE — Progress Notes (Signed)
Recreation Therapy Notes  Date: 04/16/16 Time: 1000 Location: 500 Hall Group Room  Group Topic: Self-Esteem  Goal Area(s) Addresses:  Patient will identify positive ways to increase self-esteem. Patient will verbalize benefit of increased self-esteem.  Intervention: Visual merchandiser, markers  Activity: Personalized License Plate.  Patients were to design a personalized plate, that highlighted their positive attributes, important dates/events/people and things they may want to accomplish.  Education:  Self-Esteem, Dentist.   Education Outcome: Acknowledges education/In group clarification offered/Needs additional education  Clinical Observations/Feedback: Pt did not attend group.   Victorino Sparrow, LRT/CTRS         Victorino Sparrow A 04/16/2016 11:52 AM
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
## 2016-04-16 NOTE — Progress Notes (Signed)
1:1 Note: Pt is in her room at this resting, pt ate 100% of her dinner and drunk a whole pitcher of Gatorade. Pt also took her 5 pm med by mouth without problems, pt denies SI/HI at this time. Pt remains on 1:1 observations, will continue to monitor.

## 2016-04-16 NOTE — Progress Notes (Signed)
D: Pt is resting in bed with her eyes closed.  Respirations are even and unlabored.  No distress noted.    A: Pt remains on 1:1 observation for safety.    R: Pt is safe on the unit.

## 2016-04-16 NOTE — Plan of Care (Signed)
Problem: Medication: Goal: Compliance with prescribed medication regimen will improve Outcome: Not Progressing Pt spit scheduled HS medication out on 04/15/16.

## 2016-04-16 NOTE — [facility] Suicide Risk Assessment (Signed)
[place] INPATIENT:  Family/Significant Other Suicide Prevention Education  Suicide Prevention Education:  Education Completed; Mr Victoria, husband,  [phone] has been identified by the patient as the family member/significant other with whom the patient will be residing, and identified as the person(s) who will aid the patient in the event of a mental health crisis (suicidal ideations/suicide attempt).  With written consent from the patient, the family member/significant other has been provided the following suicide prevention education, prior to the and/or following the discharge of the patient.  The suicide prevention education provided includes the following:  Suicide risk factors  Suicide prevention and interventions  National Suicide Hotline telephone number  [facility] assessment telephone number  [facility] Emergency Assistance [place] and/or Residential Mobile Crisis Unit telephone number  Request made of family/significant other to:  Remove weapons (e.g., guns, rifles, knives), all items previously/currently identified as safety concern.    Remove drugs/medications (over-the-counter, prescriptions, illicit drugs), all items previously/currently identified as a safety concern.  The family member/significant other verbalizes understanding of the suicide prevention education information provided.  The family member/significant other agrees to remove the items of safety concern listed above.  Trish Mage 04/16/2016, 3:36 PM

## 2016-04-16 NOTE — Progress Notes (Signed)
1:1 progress note:  Pt is in the bed with eyes closed.  No distress observed.  Respirations even/unlabored.  Pt continues on 1:1 for safety and high fall risk.  Sitter at bedside.  Pt safe at this time.

## 2016-04-16 NOTE — Progress Notes (Signed)
D: Pt is resting in bed with eyes closed.  Respirations are even and unlabored.  No distress noted.  A: Pt remains on 1:1 observation for safety.    R: Pt is safe on the unit.

## 2016-04-16 NOTE — Progress Notes (Signed)
1:1 Note: Pt is in the room lying in bed, bed is not verbal if  talked would just nod with eyes closed. Pt refused her morning medications, refused breakfast and drinks. Pt encouraged to eat and drink. Maintained on 1:1 for safety, pt's safe, will continue to monitor.

## 2016-04-16 NOTE — [facility] Group Notes (Signed)
[place] LCSW Group Therapy  04/16/2016 2:49 PM   Type of Therapy:  Group Therapy   Participation Level:  Engaged  Participation Quality:  Attentive  Affect:  Appropriate   Cognitive:  Alert   Insight:  Engaged  Engagement in Therapy:  Improving   Modes of Intervention:  Education, Exploration, Socialization   Summary of Progress/Problems: Kenda was engaged throughout and stayed entire time.   Shanon Brow from the [place] was here to tell his story of recovery, inform patients about [facility] and play his guitar.   Radonna Ricker 04/16/2016 2:49 PM
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
## 2016-04-16 NOTE — Tx Team (Signed)
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 acetaminophen (TYLENOL) tablet 650 mg  650 mg Oral Q6H PRN [name], NP      . alum & mag hydroxide-simeth (MAALOX/MYLANTA) 200-200-20 MG/5ML suspension 30 mL  30 mL Oral Q4H PRN [name], NP      . feeding supplement (ENSURE ENLIVE) (ENSURE ENLIVE) liquid 237 mL  237 mL Oral BID BM [name], MD   237 mL at 04/16/16 1436  . haloperidol (HALDOL) tablet 5 mg  5 mg Oral BID [name], MD       Or  . haloperidol lactate (HALDOL) injection 5 mg  5 mg Intramuscular BID [name], MD      . hydrOXYzine (ATARAX/VISTARIL) tablet 25 mg  25 mg Oral TID PRN [name], NP      . levothyroxine (SYNTHROID, LEVOTHROID) tablet 75 mcg  75 mcg Oral QAC breakfast [name], NP   75 mcg at 04/16/16 0630  . LORazepam (ATIVAN) tablet 1 mg  1 mg Oral Q6H PRN [name], MD       Or  . LORazepam (ATIVAN) injection 1 mg  1 mg Intramuscular Q6H PRN [name], MD      . LORazepam (ATIVAN) tablet 1 mg  1 mg Oral BID [name], MD       Or  . LORazepam (ATIVAN) injection 1 mg  1 mg Intramuscular BID [name], MD   1 mg at 04/16/16 1219  . magnesium hydroxide (MILK OF MAGNESIA) suspension 30 mL  30 mL Oral Daily PRN [name], NP      . multivitamin with minerals tablet 1 tablet  1 tablet Oral Daily [name], NP      . traZODone (DESYREL) tablet 100 mg  100 mg Oral QHS [name], NP      . ziprasidone (GEODON) capsule 20 mg  20 mg Oral TID PRN [name], MD       Or  . ziprasidone (GEODON) injection 10 mg  10 mg Intramuscular TID PRN [name],  MD        PTA Medications: Prescriptions Prior to Admission  Medication Sig Dispense Refill Last Dose  . carisoprodol (SOMA) 350 MG tablet TAKE 1 TABLET BY MOUTH EVERY 8 HOURS AS NEEDED FOR MUSCLE SPASMS (Patient not taking: Reported on 04/14/2016) 30 tablet 0 Not Taking at Unknown time  . levothyroxine (SYNTHROID, LEVOTHROID) 75 MCG tablet Take 1 tablet (75 mcg total) by mouth daily before breakfast. 90 tablet 1 04/14/2016 at Unknown time  . Multiple Vitamin (MULTIVITAMIN WITH MINERALS) TABS tablet Take 1 tablet by mouth daily.   Past Week at Unknown time  . pregabalin (LYRICA) 50 MG capsule Take 1 capsule (50 mg total) by mouth daily. 90 capsule 2 Past Month at Unknown time  . rifaximin (XIFAXAN) 550 MG TABS tablet Take 1 tablet (550 mg total) by mouth 3 (three) times daily. (Patient not taking: Reported on 04/14/2016) 42 tablet 0 Not Taking at Unknown time  . temazepam (RESTORIL) 30 MG capsule TAKE ONE CAPSULE BY MOUTH AS NEEDED FOR SLEEP 90 capsule 0 Past Month at Unknown time  . triamcinolone cream (KENALOG) 0.1 %  Apply 1 application topically 4 (four) times daily as needed. (Patient not taking: Reported on 04/14/2016) 30 g 0 Not Taking at Unknown time    Treatment Modalities: Medication Management, Group therapy, Case management,  1 to 1 session with clinician, Psychoeducation, Recreational therapy.   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  Therapeutic Interventions: 1 to 1 sessions, Unit Group sessions and Medication administration.  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  Therapeutic Interventions: 1 to 1 sessions, Unit Group sessions and Medication administration.  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 RN will report any adverse and/or side effects to prescribing provider.  Therapeutic Interventions: 1 on 1 counseling sessions, Psychoeducation, Medication administration, Evaluate responses to treatment, Monitor vital signs and CBGs as ordered, Perform/monitor CIWA, COWS, AIMS and Fall Risk screenings as ordered, Perform wound care treatments as ordered.  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  Short Term Goals: Patient will be able to identify at least 5 coping skills for admitting dx by conclusion of recreation therapy tx.  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  Short Term Goals: Engage patient in aftercare planning with referrals and resources  Therapeutic Interventions: Assess for all discharge needs, 1 to 1 time with Social worker, Explore available resources and support systems, Assess for adequacy in community support network, Educate family and significant other(s) on suicide prevention, Complete Psychosocial Assessment, Interpersonal group therapy.  Evaluation of Outcomes: Met  Return home, follow up outpt    Progress in Treatment: Attending groups: Yes Participating in groups: No Taking medication as prescribed: Yes Toleration medication: Yes, no side effects reported at this time Family/Significant other contact made: Yes Patient understands diagnosis: No  Limited insight  Discussing patient identified problems/goals with staff: Yes Medical problems stabilized or resolved: Yes Denies suicidal/homicidal ideation: Yes Issues/concerns per patient self-inventory: None Other: N/A  New problem(s) identified: None identified at this time.   New Short Term/Long Term Goal(s): None identified at this time.   Discharge Plan or Barriers:   Reason for  Continuation of Hospitalization: Delusions  Homicidal ideation Medication stabilization Suicidal ideation   Estimated Length of Stay: 3-5 days  Attendees: Patient: 04/16/2016  3:25 PM  Physician: [name], MD 04/16/2016  3:25 PM  Nursing: Jeanie Cooks, RN 04/16/2016  3:25 PM  RN Care Manager: Lars Pinks, RN 04/16/2016  3:25 PM  Social Worker: Ripley Fraise 04/16/2016  3:25 PM  Recreational Therapist: Laretta Bolster  04/16/2016  3:25 PM  Other: Norberto Sorenson 04/16/2016  3:25 PM  Other:  04/16/2016  3:25 PM    Scribe for Treatment Team:  Roque Lias LCSW 04/16/2016 3:25  PM
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
## 2016-04-16 NOTE — BHH Suicide Risk Assessment (Signed)
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 Allergic rhinitis [J30.9] 05/05/2007  . HYPERSOMNIA, IDIOPATHIC [G47.10] 01/13/2007   Subjective Data: Please see H&P.   Continued Clinical Symptoms:    The "Alcohol Use Disorders Identification Test", Guidelines for Use in Primary Care, Second Edition.  World Pharmacologist [facility]). Score between 0-7:  no or low risk or alcohol related problems. Score between 8-15:  moderate risk of alcohol related problems. Score between 16-19:  high risk of alcohol related problems. Score 20 or above:  warrants further diagnostic evaluation for alcohol dependence and treatment.   CLINICAL FACTORS:   Depression:   Delusional   Musculoskeletal: Strength & Muscle Tone: within normal limits Gait & Station: seen in bed Patient leans: N/A  Psychiatric Specialty Exam: Physical Exam  ROS  Blood pressure 117/78, pulse 86, temperature 98.3 F (36.8 C), temperature source Oral, resp. rate 18, height 5\' 5"  (1.651 m), weight 71.3 kg (157 lb 3 oz), SpO2 100 %.Body mass index is 26.16 kg/m.                    Please see H&P.                                       COGNITIVE FEATURES THAT CONTRIBUTE TO RISK:  Closed-mindedness, Polarized thinking and Thought constriction (tunnel vision)    SUICIDE RISK:   Moderate:  Frequent suicidal ideation with limited intensity, and duration, some specificity in terms of plans, no associated intent, good self-control, limited dysphoria/symptomatology, some risk factors present, and identifiable protective factors,  including available and accessible social support.  PLAN OF CARE: Please see H&P.   I certify that inpatient services furnished can reasonably be expected to improve the patient's condition.   Letonya Mangels, MD 04/16/2016, 12:10 PM

## 2016-04-17 LAB — HEMOGLOBIN A1C
Hgb A1c MFr Bld: 5.1 % (ref 4.8–5.6)
Mean Plasma Glucose: 100 mg/dL

## 2016-04-17 LAB — PREGNANCY, URINE: Preg Test, Ur: NEGATIVE

## 2016-04-17 LAB — RPR: RPR Ser Ql: NONREACTIVE

## 2016-04-17 LAB — PROLACTIN: Prolactin: 23.3 ng/mL (ref 4.8–23.3)

## 2016-04-17 NOTE — Progress Notes (Signed)
1:1 progress note:  Pt is lying in bed with eyes closed.  No distress observed.  Respirations even/unlabored.  Continue 1:1 for safety.  Sitter at bedside.  Pt safe at this time.

## 2016-04-17 NOTE — Progress Notes (Signed)
Adult Psychoeducational Group Note  Date:  04/17/2016 Time:  8:50 PM  Group Topic/Focus:  Wrap-Up Group:   The focus of this group is to help patients review their daily goal of treatment and discuss progress on daily workbooks.  Participation Level:  Minimal  Participation Quality:  Appropriate  Affect:  Flat  Cognitive:  Alert  Insight: Appropriate  Engagement in Group:  Engaged  Modes of Intervention:  Discussion  Additional Comments:  Pt stated that today was a good day. Her goal was to get back to eating.  Wynelle Fanny R 04/17/2016, 8:50 PM

## 2016-04-17 NOTE — BHH Group Notes (Signed)
[place] Group Notes:  (Counselor/Nursing/MHT/Case Management/Adjunct)  04/17/2016 1:15PM  Type of Therapy:  Group Therapy  Participation Level:  Active  Participation Quality:  Appropriate  Affect:  Flat  Cognitive:  Oriented  Insight:  Improving  Engagement in Group:  Limited  Engagement in Therapy:  Limited  Modes of Intervention:  Discussion, Exploration and Socialization  Summary of Progress/Problems: The topic for group was balance in life.  Pt participated in the discussion about when their life was in balance and out of balance and how this feels.  Pt discussed ways to get back in balance and short term goals they can work on to get where they want to be. In bed, asleep.   Charlotte Wise 04/17/2016 3:07 PM
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
## 2016-04-17 NOTE — Progress Notes (Signed)
1:1 progress note:  Pt has spent most of her evening in her room, but did come out to attend the evening wrap up group.  She has been pleasant and cooperative with staff and talking a little with her Engineer, production.  She does not initiate conversation, but will talk if someone engages her.  She says she has been eating today.  She asked for some ear plugs for bedtime and a refill of her water pitcher which was given.  Pt continues on 1:1 for safety, but has visibly improved.  Pt voiced no other needs or concerns.  She stated that she did not feel she needed the sleep aid ordered for her.  Support and encouragement offered.  Discharge plans are in process.  At this time, pt is in her bed with eyes closed.  No distress observed.  Sitter at pt's bedside.  Pt is safe at this time.

## 2016-04-17 NOTE — Progress Notes (Signed)
1:1 progress note:  Pt has slept all night.  At this time, pt is still in bed with eyes closed.  No distress observed. Respirations even/unlabored.  Pt slept most of the night on her back, but has changed to her side in the last couple hours.  Sitter at bedside.  Continue 1:1 for safety.  Pt remains safe.

## 2016-04-17 NOTE — Progress Notes (Signed)
Recreation Therapy Notes  Date: 04/17/16 Time: 1000 Location: 500 Hall Dayroom  Group Topic: Communication, Team Building, Problem Solving  Goal Area(s) Addresses:  Patient will effectively work with peer towards shared goal.  Patient will identify skill used to make activity successful.  Patient will identify how skills used during activity can be used to reach post d/c goals.   Intervention: STEM Activity   Activity: In team's, using 20 small plastic cups, patients were asked to build the tallest free standing tower possible.    Education: Education officer, community, Dentist.   Education Outcome: Acknowledges education/In group clarification offered/Needs additional education.   Clinical Observations/Feedback:  Pt did not attend group.   Victorino Sparrow, LRT/CTRS         Victorino Sparrow A 04/17/2016 11:55 AM

## 2016-04-17 NOTE — Progress Notes (Signed)
Pt was observed up and out in the milieu with sitter at her side at the beginning of the shift.  Her husband came to visit and brought her some clothes and some fruit and nuts that she prefers to eat.  The previous RN reported that the pt ate most of a salad at dinner.  Conversation was minimal with the patient, but she was able to contract with staff.  She denies AVH.  She filled out the consent form allowing staff to be able to talk with her husband about her care.  Pt still seems rather sullen, but she is talking which is an improvement from when she was admitted to the unit.  Support and encouragement offered.  Pt was asleep when writer took her hs med to her room which was a sleeping aid, so pt did not have any meds this evening.  Pt was cooperative and appropriate this evening.  Support and encouragement offered.  Discharge plans are in process.  Safety maintained with q15 minute checks.

## 2016-04-17 NOTE — Progress Notes (Signed)
Patient has been monitored on 1:1 with no incident.  Patient has been noted to be resting quietly in her room, compliant with medications and has not been a danger to herself or others.  Patient's environment is secure and safe and patient's sitter has been in close proximity to her.  Patient able to contract for safety.

## 2016-04-17 NOTE — Progress Notes (Addendum)
[facility] MD Progress Note  04/17/2016 2:17 PM Charlotte Wise  MRN:  [id number] Subjective:  Patient seen as mute.  Objective: Charlotte Wise is a 34 y old CF, who is married , lives with husband and 41 y old son in [place] , who has a hx of depression during post partum period, never been treated , presented to [facility] through [facility] , for psychosis and thoughts of harming her husband and son.  Patient seen and chart reviewed.Discussed patient with treatment team.  Pt today seen with 1;1 sitter- seen with her eyes closed , minimal response to questions asked by Probation officer. Per sitter - pt has been talking to her on and off , appropriate , continues to need redirection. Pt also has been making an attempt to take her meals and medications when encouraged. Pt continues to be on 1:1 precaution for safety .    Principal Problem: Schizophrenia, catatonic type [facility]) Diagnosis:   Patient Active Problem List   Diagnosis Date Noted  . 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 Allergic rhinitis [J30.9] 05/05/2007  . HYPERSOMNIA, IDIOPATHIC [G47.10] 01/13/2007   Total Time spent with patient: 25 minutes  Past Psychiatric History: Please see H&P.   Past Medical History:  Past Medical History:  Diagnosis Date  . Allergy   . Anxiety   . Arthritis   . Major depressive disorder, 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[place]) 04/15/2016  . Thyroid disease     Past Surgical History:  Procedure Laterality Date  . ANKLE ARTHROSCOPY Right 2000  . KNEE ARTHROSCOPY Left 1988  . KNEE ARTHROSCOPY Right 1999  . LAPAROSCOPY  2001  . NASAL POLYP SURGERY  2006  . TONSILLECTOMY AND ADENOIDECTOMY  1981   Family History:  Family History  Problem Relation Age of Onset  . Cancer Maternal Grandmother   . Hyperlipidemia Paternal Grandfather   . Miscarriages / Stillbirths Paternal Grandfather   . Heart disease Paternal Grandfather   . Clotting disorder Mother   . Kidney disease Maternal Grandfather   . Mental  illness Neg Hx    Family Psychiatric  History: Please see H&P.  Social History: Please see H&P.  History  Alcohol Use No     History  Drug Use No    Social History   Social History  . Marital status: Married    Spouse name: N/A  . Number of children: N/A  . Years of education: N/A   Social History Main Topics  . Smoking status: Never Smoker  . Smokeless tobacco: Never Used  . Alcohol use No  . Drug use: No  . Sexual activity: Not Asked   Other Topics Concern  . None   Social History Narrative  . None   Additional Social History:                         Sleep: Fair  Appetite:  Poor  Current Medications: Current Facility-Administered Medications  Medication Dose Route Frequency Provider Last Rate Last Dose  . acetaminophen (TYLENOL) tablet 650 mg  650 mg Oral Q6H PRN [name], NP      . alum & mag hydroxide-simeth (MAALOX/MYLANTA) 200-200-20 MG/5ML suspension 30 mL  30 mL Oral Q4H PRN [name], NP   30 mL at 04/17/16 1310  . feeding supplement (ENSURE ENLIVE) (ENSURE ENLIVE) liquid 237 mL  237 mL Oral BID BM [name], MD   237 mL at 04/16/16 1436  . haloperidol (HALDOL)  tablet 5 mg  5 mg Oral BID [name], MD   5 mg at 04/17/16 1121   Or  . haloperidol lactate (HALDOL) injection 5 mg  5 mg Intramuscular BID [name], MD      . hydrOXYzine (ATARAX/VISTARIL) tablet 25 mg  25 mg Oral TID PRN [name], NP      . levothyroxine (SYNTHROID, LEVOTHROID) tablet 75 mcg  75 mcg Oral QAC breakfast [name], NP   75 mcg at 04/17/16 0636  . LORazepam (ATIVAN) tablet 1 mg  1 mg Oral Q6H PRN [name], MD       Or  . LORazepam (ATIVAN) injection 1 mg  1 mg Intramuscular Q6H PRN [name], MD      . LORazepam (ATIVAN) tablet 1 mg  1 mg Oral BID [name], MD   1 mg at 04/17/16 1122   Or  . LORazepam (ATIVAN) injection 1 mg  1 mg Intramuscular BID [name], MD   1 mg at 04/16/16 1219  . magnesium hydroxide  (MILK OF MAGNESIA) suspension 30 mL  30 mL Oral Daily PRN [name], NP      . multivitamin with minerals tablet 1 tablet  1 tablet Oral Daily [name], NP   1 tablet at 04/17/16 1121  . traZODone (DESYREL) tablet 100 mg  100 mg Oral QHS [name], NP      . ziprasidone (GEODON) capsule 20 mg  20 mg Oral TID PRN [name], MD       Or  . ziprasidone (GEODON) injection 10 mg  10 mg Intramuscular TID PRN [name], MD        Lab Results:  Results for orders placed or performed during the hospital encounter of 04/15/16 (from the past 48 hour(s))  TSH     Status: None   Collection Time: 04/16/16  6:30 AM  Result Value Ref Range   TSH 2.484 0.350 - 4.500 uIU/mL    Comment: Performed by a 3rd Generation assay with a functional sensitivity of <=0.01 uIU/mL. Performed at [facility], [place] [address], [place], [place] [address]   Lipid panel     Status: None   Collection Time: 04/16/16  6:30 AM  Result Value Ref Range   Cholesterol 160 0 - 200 mg/dL   Triglycerides 71 <150 mg/dL   HDL 51 >40 mg/dL   Total CHOL/HDL Ratio 3.1 RATIO   VLDL 14 0 - 40 mg/dL   LDL Cholesterol 95 0 - 99 mg/dL    Comment:        Total Cholesterol/HDL:CHD Risk Coronary Heart Disease Risk Table                     Men   Women  1/2 Average Risk   3.4   3.3  Average Risk       5.0   4.4  2 X Average Risk   9.6   7.1  3 X Average Risk  23.4   11.0        Use the calculated Patient Ratio above and the CHD Risk Table to determine the patient's CHD Risk.        ATP III CLASSIFICATION (LDL):  <100     mg/dL   Optimal  100-129  mg/dL   Near or Above                    Optimal  130-159  mg/dL   Borderline  160-189  mg/dL   High  >190     mg/dL   Very High Performed at [place] [address] [place], [place] [address]   Hemoglobin A1c     Status: None   Collection Time: 04/16/16  6:30 AM  Result Value Ref Range   Hgb A1c MFr Bld 5.1 4.8 - 5.6 %     Comment: (NOTE)         Pre-diabetes: 5.7 - 6.4         Diabetes: >6.4         Glycemic control for adults with diabetes: <7.0    Mean Plasma Glucose 100 mg/dL    Comment: (NOTE) Performed At: [facility], [place] [id number] Lindon Romp MD [id number] Performed at [facility], [place] [address] [place] [address] [place], [place] [address]   Prolactin     Status: None   Collection Time: 04/16/16  6:30 AM  Result Value Ref Range   Prolactin 23.3 4.8 - 23.3 ng/mL    Comment: (NOTE) Performed At: [facility] [place], [place] [id number] Lindon Romp MD [id number] Performed at [facility], [place] [address] [place], [place] [address]   hCG, quantitative, pregnancy     Status: None   Collection Time: 04/16/16  6:28 PM  Result Value Ref Range   hCG, Beta Chain, Quant, S <1 <5 mIU/mL    Comment:          GEST. AGE      CONC.  (mIU/mL)   <=1 WEEK        5 - 50     2 WEEKS       50 - 500     3 WEEKS       100 - 10,000     4 WEEKS     1,000 - 30,000     5 WEEKS     3,500 - 115,000   6-8 WEEKS     12,000 - 270,000    12 WEEKS     15,000 - 220,000        FEMALE AND NON-PREGNANT FEMALE:     LESS THAN 5 mIU/mL Performed at [facility], [place] [address] [place], [place] [address]   Comprehensive metabolic panel     Status: Abnormal   Collection Time: 04/16/16  6:28 PM  Result Value Ref Range   Sodium 137 135 - 145 mmol/L   Potassium 4.2 3.5 - 5.1 mmol/L   Chloride 103 101 - 111 mmol/L   CO2 21 (L) 22 - 32 mmol/L   Glucose, Bld 101 (H) 65 - 99 mg/dL   BUN 21 (H) 6 - 20 mg/dL   Creatinine, Ser 0.62 0.44 - 1.00 mg/dL   Calcium 9.0 8.9 - 10.3 mg/dL   Total Protein 6.9 6.5 - 8.1 g/dL   Albumin 4.6 3.5 - 5.0 g/dL   AST 70 (H) 15 - 41 U/L   ALT 41 14 - 54 U/L   Alkaline Phosphatase 49 38 - 126 U/L   Total Bilirubin 1.2 0.3 - 1.2 mg/dL   GFR calc non Af Amer >60 >60 mL/min   GFR  calc Af Amer >60 >60 mL/min    Comment: (NOTE) The eGFR has been calculated using the CKD EPI equation. This calculation has not been validated in all clinical situations. eGFR's persistently <60 mL/min signify possible Chronic Kidney  Disease.    Anion gap 13 5 - 15    Comment: Performed at [facility], [place] [address] [place], [place] [address]  Vitamin B12     Status: Abnormal   Collection Time: 04/16/16  6:28 PM  Result Value Ref Range   Vitamin B-12 2,166 (H) 180 - 914 pg/mL    Comment: (NOTE) This assay is not validated for testing neonatal or myeloproliferative syndrome specimens for Vitamin B12 levels. Performed at [facility], [place] [address] [place], [place] [address]   Folate     Status: None   Collection Time: 04/16/16  6:28 PM  Result Value Ref Range   Folate 45.4 >5.9 ng/mL    Comment: Performed at [facility], [place] [address] [place], [place] [address]  RPR     Status: None   Collection Time: 04/16/16  6:28 PM  Result Value Ref Range   RPR Ser Ql Non Reactive Non Reactive    Comment: (NOTE) Performed At: [facility] [place], [place] [id number] Lindon Romp MD [id number] Performed at [facility], [place] [address] [place], [place] [address]   Pregnancy, urine     Status: None   Collection Time: 04/16/16  7:57 PM  Result Value Ref Range   Preg Test, Ur NEGATIVE NEGATIVE    Comment:        THE SENSITIVITY OF THIS METHODOLOGY IS >20 mIU/mL. Performed at [facility], [place] [address], [place], [place] [address]     Blood Alcohol level:  Lab Results  Component Value Date   ETH <5 83/33/8329    Metabolic Disorder Labs: Lab Results  Component Value Date   HGBA1C 5.1 04/16/2016   MPG 100 04/16/2016   MPG 103 10/12/2015   Lab Results  Component Value Date   PROLACTIN 23.3 04/16/2016   Lab Results  Component Value Date   CHOL 160 04/16/2016   TRIG  71 04/16/2016   HDL 51 04/16/2016   CHOLHDL 3.1 04/16/2016   VLDL 14 04/16/2016   LDLCALC 95 04/16/2016    Physical Findings: AIMS: Facial and Oral Movements Muscles of Facial Expression: None, normal Lips and Perioral Area: None, normal Jaw: None, normal Tongue: None, normal,Extremity Movements Upper (arms, wrists, hands, fingers): None, normal Lower (legs, knees, ankles, toes): None, normal, Trunk Movements Neck, shoulders, hips: None, normal, Overall Severity Severity of abnormal movements (highest score from questions above): None, normal Incapacitation due to abnormal movements: None, normal Patient's awareness of abnormal movements (rate only patient's report): No Awareness, Dental Status Current problems with teeth and/or dentures?: No Does patient usually wear dentures?: No  CIWA:    COWS:     Musculoskeletal: Strength & Muscle Tone: within normal limits Gait & Station: normal Patient leans: N/A  Psychiatric Specialty Exam: Physical Exam  Nursing note and vitals reviewed.   Review of Systems  Unable to perform ROS: Mental acuity    Blood pressure 112/81, pulse (!) 123, temperature 98.7 F (37.1 C), temperature source Oral, resp. rate 16, height 5' 5"  (1.651 m), weight 71.3 kg (157 lb 3 oz), SpO2 98 %.Body mass index is 26.16 kg/m.  General Appearance: Guarded  Eye Contact:  None  Speech:  mute   Volume:  mute  Mood:  does not respond  Affect:  anxious  Thought Process:  Disorganized and Descriptions of Associations:UTA  Orientation:  Other:  to situation , person  Thought Content:  Delusions  Suicidal  Thoughts:  did nto express any  Homicidal Thoughts:  did not express any  Memory:  seems to be grossly intact   Judgement:  Impaired  Insight:  Shallow  Psychomotor Activity:  Restlessness  Concentration:  Concentration: Poor and Attention Span: Poor  Recall:  Poor  Fund of Knowledge:  Poor  Language:  Poor  Akathisia:  No  Handed:  Right  AIMS (if  indicated):     Assets:  Social Support  ADL's:  Intact  Cognition:  WNL  Sleep:  Number of Hours: 6.75     Treatment Plan Summary:Patient seen as mute when Probation officer attempted tot alk to her , however patient has been talking to selective staff - has been speaking to her sitter and making an attempt to take meals and medications - which is an improvement from admission.   Patient continues to be on forced medication order - started - 1/24 /18.  Schizophrenia, catatonic type ([place]) unstable  Will continue today 04/17/16  plan as below except where it is noted.   Daily contact with patient to assess and evaluate symptoms and progress in treatment and Medication management schizophrenia  Will continue Haldol 5 mg PO/IM bid for psychosis. Will continue Cogentin 0.5 mg PO/IM bid for EPS.  For catatonic sx: Start Ativan 1 mg PO/IM BID .  For sleep problems: Trazodone 100 mg po qhs.  For anxiety/agitation: Start PRN medications as per agitation protocol.  Continue 1:1 precaution for safety/fall risk.  Collateral information obtained from family - see H&P.  Reviewed past medical records,treatment plan.   Will continue to monitor vitals ,medication compliance and treatment side effects while patient is here.   Will monitor for medical issues as well as call consult as needed.   Reviewed labs  uds - positive for BZD( likely given in ED), TSH - WNL, LIPID PANEL - wnl , AST - elevated - likely due to OTC medications used , will monitor , pregnancy test - negative , vitamin b12 - high , folate - wnl , RPR - NR., CMP - Bun -borderline high.  I have reviewed EKG for qtc - wnl.  CSW will continue working on disposition.   Patient to participate in therapeutic milieu .     Kynsie Falkner, MD 04/17/2016, 2:17 PM

## 2016-04-18 ENCOUNTER — Inpatient Hospital Stay (HOSPITAL_COMMUNITY): Payer: BLUE CROSS/BLUE SHIELD

## 2016-04-18 ENCOUNTER — Ambulatory Visit (HOSPITAL_COMMUNITY)
Admission: AD | Admit: 2016-04-18 | Discharge: 2016-04-18 | Disposition: A | Discharge status: Home / Self Care | Payer: BLUE CROSS/BLUE SHIELD | Attending: Psychiatry | Admitting: Psychiatry

## 2016-04-18 DIAGNOSIS — R930 Abnormal findings on diagnostic imaging of skull and head, not elsewhere classified: Secondary | ICD-10-CM | POA: Diagnosis not present

## 2016-04-18 MED ORDER — LORAZEPAM 0.5 MG PO TABS
0.5000 mg | ORAL_TABLET | Freq: Two times a day (BID) | ORAL | Status: DC
  Administered 2016-04-18 – 2016-04-20 (×4): 0.5 mg via ORAL
  Filled 2016-04-18 (×4): qty 1

## 2016-04-18 MED ORDER — LORAZEPAM 2 MG/ML IJ SOLN: 0.5000 mg | Freq: Two times a day (BID) | INTRAMUSCULAR | Status: DC

## 2016-04-18 NOTE — Progress Notes (Addendum)
[facility] MD Progress Note  04/18/2016 2:24 PM Charlotte Wise  MRN:  [id number] Subjective:  Patient states " I am ok. I do not remember , its all kind of hazy."   Objective: [name] is a 54 y old CF, who is married , lives with husband and 49 y old son in [place] , who has a hx of depression during post partum period, never been treated , presented to [facility] through [facility] , for psychosis and thoughts of harming her husband and son.  Patient seen and chart reviewed.Discussed patient with treatment team.  Pt today seen as alert , oriented , unable to give much details about the events that happened prior to admission. Pt continues to be delusional - however denies any AH today. Pt today continues to have 1:1 PRECAUTION for safety reasons. Pt has tried several times to strangle herself with her hands after admission - continues to be not at baseline and hence needs close supervision.    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 Allergic rhinitis [J30.9] 05/05/2007  . HYPERSOMNIA, IDIOPATHIC [G47.10] 01/13/2007   Total Time spent with patient: 25 minutes  Past Psychiatric History: Please see H&P.   Past Medical History:  Past Medical History:  Diagnosis Date  . Allergy   . Anxiety   . Arthritis   . Major depressive disorder, 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[place]) 04/15/2016  . Thyroid disease     Past Surgical History:  Procedure Laterality Date  . ANKLE ARTHROSCOPY Right 2000  . KNEE ARTHROSCOPY Left 1988  . KNEE ARTHROSCOPY Right 1999  . LAPAROSCOPY  2001  . NASAL POLYP SURGERY  2006  . TONSILLECTOMY AND ADENOIDECTOMY  1981   Family History:  Family History  Problem Relation Age of Onset  . Cancer Maternal Grandmother   . Hyperlipidemia Paternal Grandfather   . Miscarriages / Stillbirths Paternal Grandfather   . Heart disease Paternal  Grandfather   . Clotting disorder Mother   . Kidney disease Maternal Grandfather   . Mental illness Neg Hx    Family Psychiatric  History: Please see H&P.  Social History: Please see H&P.  History  Alcohol Use No     History  Drug Use No    Social History   Social History  . Marital status: Married    Spouse name: N/A  . Number of children: N/A  . Years of education: N/A   Social History Main Topics  . Smoking status: Never Smoker  . Smokeless tobacco: Never Used  . Alcohol use No  . Drug use: No  . Sexual activity: Not Asked   Other Topics Concern  . None   Social History Narrative  . None   Additional Social History:                         Sleep: Fair  Appetite:  Poor  Current Medications: Current Facility-Administered Medications  Medication Dose Route Frequency Provider Last Rate Last Dose  . acetaminophen (TYLENOL) tablet 650 mg  650 mg Oral Q6H PRN [name], NP      . alum & mag hydroxide-simeth (MAALOX/MYLANTA) 200-200-20 MG/5ML suspension 30 mL  30 mL Oral Q4H PRN [name], NP   30 mL at 04/17/16 1310  . feeding supplement (ENSURE ENLIVE) (ENSURE ENLIVE) liquid 237 mL  237 mL Oral  BID BM [name], MD   237 mL at 04/16/16 1436  . haloperidol (HALDOL) tablet 5 mg  5 mg Oral BID [name], MD   5 mg at 04/18/16 7893   Or  . haloperidol lactate (HALDOL) injection 5 mg  5 mg Intramuscular BID [name], MD      . hydrOXYzine (ATARAX/VISTARIL) tablet 25 mg  25 mg Oral TID PRN [name], NP      . levothyroxine (SYNTHROID, LEVOTHROID) tablet 75 mcg  75 mcg Oral QAC breakfast [name], NP   75 mcg at 04/18/16 8101  . LORazepam (ATIVAN) tablet 1 mg  1 mg Oral Q6H PRN [name], MD       Or  . LORazepam (ATIVAN) injection 1 mg  1 mg Intramuscular Q6H PRN [name], MD      . LORazepam (ATIVAN) tablet 1 mg  1 mg Oral BID [name], MD   1 mg at 04/18/16 7510   Or  . LORazepam (ATIVAN) injection  1 mg  1 mg Intramuscular BID [name], MD   1 mg at 04/16/16 1219  . magnesium hydroxide (MILK OF MAGNESIA) suspension 30 mL  30 mL Oral Daily PRN [name], NP      . multivitamin with minerals tablet 1 tablet  1 tablet Oral Daily [name], NP   1 tablet at 04/18/16 [phone]  . traZODone (DESYREL) tablet 100 mg  100 mg Oral QHS [name], NP      . ziprasidone (GEODON) capsule 20 mg  20 mg Oral TID PRN [name], MD       Or  . ziprasidone (GEODON) injection 10 mg  10 mg Intramuscular TID PRN [name], MD        Lab Results:  Results for orders placed or performed during the hospital encounter of 04/15/16 (from the past 76 hour(s))  hCG, quantitative, pregnancy     Status: None   Collection Time: 04/16/16  6:28 PM  Result Value Ref Range   hCG, Beta Chain, Quant, S <1 <5 mIU/mL    Comment:          GEST. AGE      CONC.  (mIU/mL)   <=1 WEEK        5 - 50     2 WEEKS       50 - 500     3 WEEKS       100 - 10,000     4 WEEKS     1,000 - 30,000     5 WEEKS     3,500 - 115,000   6-8 WEEKS     12,000 - 270,000    12 WEEKS     15,000 - 220,000        FEMALE AND NON-PREGNANT FEMALE:     LESS THAN 5 mIU/mL Performed at [facility], [place] [address], [place],  [address]   Comprehensive metabolic panel     Status: Abnormal   Collection Time: 04/16/16  6:28 PM  Result Value Ref Range   Sodium 137 135 - 145 mmol/L   Potassium 4.2 3.5 - 5.1 mmol/L   Chloride 103 101 - 111 mmol/L   CO2 21 (L) 22 - 32 mmol/L   Glucose, Bld 101 (H) 65 - 99 mg/dL   BUN 21 (H) 6 - 20 mg/dL   Creatinine, Ser 0.62 0.44 - 1.00 mg/dL   Calcium 9.0 8.9 -  10.3 mg/dL   Total Protein 6.9 6.5 - 8.1 g/dL   Albumin 4.6 3.5 - 5.0 g/dL   AST 70 (H) 15 - 41 U/L   ALT 41 14 - 54 U/L   Alkaline Phosphatase 49 38 - 126 U/L   Total Bilirubin 1.2 0.3 - 1.2 mg/dL   GFR calc non Af Amer >60 >60 mL/min   GFR calc Af Amer >60 >60 mL/min    Comment: (NOTE) The eGFR has been  calculated using the CKD EPI equation. This calculation has not been validated in all clinical situations. eGFR's persistently <60 mL/min signify possible Chronic Kidney Disease.    Anion gap 13 5 - 15    Comment: Performed at [facility], [place] [address], [place], [place] [address]  Vitamin B12     Status: Abnormal   Collection Time: 04/16/16  6:28 PM  Result Value Ref Range   Vitamin B-12 2,166 (H) 180 - 914 pg/mL    Comment: (NOTE) This assay is not validated for testing neonatal or myeloproliferative syndrome specimens for Vitamin B12 levels. Performed at [facility], [place] [address], [place], [place] [address]   Folate     Status: None   Collection Time: 04/16/16  6:28 PM  Result Value Ref Range   Folate 45.4 >5.9 ng/mL    Comment: Performed at [facility], [place] [address] [place], [place] [address]  RPR     Status: None   Collection Time: 04/16/16  6:28 PM  Result Value Ref Range   RPR Ser Ql Non Reactive Non Reactive    Comment: (NOTE) Performed At: [facility] [place], [place] [id number] Lindon Romp MD [id number] Performed at [facility], [place] [address] [place], [place] [address]   Pregnancy, urine     Status: None   Collection Time: 04/16/16  7:57 PM  Result Value Ref Range   Preg Test, Ur NEGATIVE NEGATIVE    Comment:        THE SENSITIVITY OF THIS METHODOLOGY IS >20 mIU/mL. Performed at [facility], [place] [address] [place],  [address]     Blood Alcohol level:  Lab Results  Component Value Date   ETH <5 16/60/6004    Metabolic Disorder Labs: Lab Results  Component Value Date   HGBA1C 5.1 04/16/2016   MPG 100 04/16/2016   MPG 103 10/12/2015   Lab Results  Component Value Date   PROLACTIN 23.3 04/16/2016   Lab Results  Component Value Date   CHOL 160 04/16/2016   TRIG 71 04/16/2016   HDL 51 04/16/2016   CHOLHDL 3.1 04/16/2016   VLDL  14 04/16/2016   LDLCALC 95 04/16/2016    Physical Findings: AIMS: Facial and Oral Movements Muscles of Facial Expression: None, normal Lips and Perioral Area: None, normal Jaw: None, normal Tongue: None, normal,Extremity Movements Upper (arms, wrists, hands, fingers): None, normal Lower (legs, knees, ankles, toes): None, normal, Trunk Movements Neck, shoulders, hips: None, normal, Overall Severity Severity of abnormal movements (highest score from questions above): None, normal Incapacitation due to abnormal movements: None, normal Patient's awareness of abnormal movements (rate only patient's report): No Awareness, Dental Status Current problems with teeth and/or dentures?: No Does patient usually wear dentures?: No  CIWA:    COWS:     Musculoskeletal: Strength & Muscle Tone: within normal limits Gait & Station: normal Patient leans: N/A  Psychiatric Specialty Exam: Physical Exam  Nursing note and  vitals reviewed.   Review of Systems  All other systems reviewed and are negative.   Blood pressure (!) 93/50, pulse 71, temperature 98.7 F (37.1 C), temperature source Oral, resp. rate 16, height 5' 5"  (1.651 m), weight 71.3 kg (157 lb 3 oz), last menstrual period 04/02/2016, SpO2 98 %.Body mass index is 26.16 kg/m.  General Appearance: Guarded  Eye Contact:  None  Speech:  normal  Volume:  normal  Mood:  Anxious and Dysphoric  Affect:  Constricted  Thought Process:  Disorganized - improving and Descriptions of Associations:linear   Orientation:  Full (Time, Place, and Person)  Thought Content:  Delusions  Suicidal Thoughts:  did nto express any- however has tried to strangle self twice after being admitted  Homicidal Thoughts:  did not express any  Memory:  Immediate;   Fair Recent;   Poor Remote;   Poor  Judgement:  Impaired  Insight:  Shallow  Psychomotor Activity:  Restlessness  Concentration:  Concentration: Fair and Attention Span: Fair  Recall:  [employer] of  Knowledge:  Fair  Language:  Fair  Akathisia:  No  Handed:  Right  AIMS (if indicated):   0  Assets:  Social Support  ADL's:  Intact  Cognition:  WNL  Sleep:  Number of Hours: 6.75     Treatment Plan Summary:Patient seen as verbally responding to questions asked by Probation officer - which is an improvement - but she has some thought blocking , continues to report she does not remember the events that led to her hospitalization. Pt continues to need 1:1 precaution for safety.  Continue treatment.  Patient continues to be on forced medication order - started - 1/24 /18.  Schizophrenia, catatonic type ([place]) unstable  Will continue today 04/18/16  plan as below except where it is noted.   Daily contact with patient to assess and evaluate symptoms and progress in treatment and Medication management schizophrenia  Will continue Haldol 5 mg PO/IM bid for psychosis. Will continue Cogentin 0.5 mg PO/IM bid for EPS.  For catatonic sx: Will reduce Ativan to 0.5 mg PO/IM BID .  For sleep problems: Trazodone 100 mg po qhs.  For anxiety/agitation: Started PRN medications as per agitation protocol.  Continue 1:1 precaution for safety/fall risk.  Collateral information obtained from family - see H&P.  Reviewed past medical records,treatment plan.   Will continue to monitor vitals ,medication compliance and treatment side effects while patient is here.   Will monitor for medical issues as well as call consult as needed.   Reviewed labs  uds - positive for BZD( likely given in ED), TSH - WNL, LIPID PANEL - wnl , AST - elevated - likely due to OTC medications used , will monitor , pregnancy test - negative , vitamin b12 - high , folate - wnl , RPR - NR., CMP - Bun -borderline high.  I have reviewed EKG for qtc - wnl.  CSW will continue working on disposition.   Patient to participate in therapeutic milieu .     Mohogany Toppins, MD 04/18/2016, 2:24 PM

## 2016-04-18 NOTE — BHH Group Notes (Signed)
Adult Psychoeducational Group Note  Date:  04/18/2016 Time:  8:00 PM  Group Topic/Focus:  Wrap-Up Group:   The focus of this group is to help patients review their daily goal of treatment and discuss progress on daily workbooks.  Participation Level:  Minimal  Participation Quality:  Inattentive  Affect:  Flat  Cognitive:  Confused  Insight: Limited  Engagement in Group:  Limited  Modes of Intervention:  Discussion and Education  Additional Comments:  Patient reported working on increasing her participation in all groups throughout the day.  Patient reported feeling that she was able to accomplish the goal.  Marcine Matar 04/18/2016, 9:27 PM
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
## 2016-04-18 NOTE — Progress Notes (Signed)
Pt reports she has had a good day and has been able to attend all the unit groups.  She says that the medications are working and that her thoughts are more clear.  She denies SI/HI/AVH.  She states she has bee able to sleep without the Trazodone so far and still does not want to take it.  Pt reports she has been eating ok.  Pt voiced no needs or concerns.  Pt continues on 1:1 for safety.  Sitter has been with pt at all times, and is bedside at the present time.  Support and encouragement offered.  Pt makes her needs known to staff.  Discharge plans are in process.  Pt states the doctor said she would probably be here through the weekend.  Pt continues on 1:1 for safety.  Pt is safe at this time.

## 2016-04-18 NOTE — Progress Notes (Signed)
Patient has been monitored on 1:1 with no incident.  Patient has been noted to be resting quietly in her room, compliant with medications and has not been a danger to herself or others.  Patient's environment is secure and safe and patient's sitter has been in close proximity to her.  Patient able to contract for safety.

## 2016-04-18 NOTE — Progress Notes (Signed)
1:1 progress note:  Pt is still asleep, lying in her bed with eyes closed.  No distress observed.  Respirations even/unlabored.  MHT reports that pt has rested all night.  Continue 1:1 for safety.  Sitter at bedside.  Pt remains safe.

## 2016-04-18 NOTE — Progress Notes (Signed)
Recreation Therapy Notes  Date: 04/18/16 Time: 1000 Location: 500 Hall Dayroom  Group Topic: Wellness  Goal Area(s) Addresses:  Patient will define components of whole wellness. Patient will verbalize benefit of whole wellness.  Behavioral Response: Engaged  Intervention: [employer], chairs  Activity: Volleyball.  Patients were arranged in a circle.  Patients were to toss the beach ball back and forth to each other without letting it roll to a stop on the floor.  LRT was to keep count of the number of hits the patients had on the ball.  Patients were trying to break the record from a previous group.  Education: Wellness, Dentist.   Education Outcome: Acknowledges education/In group clarification offered/Needs additional education.   Clinical Observations/Feedback: Pt was smiling throughout group.  Pt was quiet but engaged in the activity.  Pt was focused and seemed to enjoy the activity.   Victorino Sparrow, LRT/CTRS         Victorino Sparrow A 04/18/2016 11:26 AM

## 2016-04-18 NOTE — Progress Notes (Signed)
1:1 progress note:  Pt is lying in bed with eyes closed.  No distress observed.  Respirations even/unlabored.  Continue 1:1 for safety.  Sitter at bedside.  Pt is safe at this time.

## 2016-04-19 NOTE — Progress Notes (Signed)
D.  Pt pleasant but guarded on approach, did not attend evening wrap up group but did come up for night time medications.  No complaints voiced.  Pt denies SI/HI/hallucinatinons at this time.  A.  Support and encouragement offered, medication given as ordered  R. Pt remains safe on the unit, will continue to monitor.

## 2016-04-19 NOTE — Progress Notes (Signed)
1:1 progress note:  Pt is still asleep at this time.  She is resting in bed with eyes closed.  No distress observed.  Continue 1:1 for safety.  Sitter at bedside.  Pt remains safe.

## 2016-04-19 NOTE — Progress Notes (Signed)
D: Pt visible in dayroom, observed watching TV with peers and eating dinner at this time. Pt is guarded but cooperative with care thus far.   A: Evening medications administered as prescribed. Continued support and encouragement provided to pt. 1:1 precaution maintained as ordered with assigned staff in attendance at all times. R: Pt remains medication compliant. Denies concerns. Safety maintained on unit. POC continues.

## 2016-04-19 NOTE — Progress Notes (Signed)
Did not attend group 

## 2016-04-19 NOTE — BHH Group Notes (Signed)
Adult Psychoeducational Group Note  Date:  04/19/2016 Time:  [id number]  Group Topic/Focus:  Goal setting group with discussion about healthy coping skills. During this group patients were asked to identify one coping skill which was written on the marker board, and were asked to identify the thing that was most important to them today which they would like to accomplish.  After the nurse provided education on holistic self-care, and coached patients through deep-breathing exercises.   Participation Level:  Active  Participation Quality:  Appropriate and Drowsy  Affect:  Blunted  Cognitive:  Alert and Appropriate  Insight: Lacking  Engagement in Group:  Engaged  Modes of Intervention:  Education and Support  Additional Comments:  Patient listed one coping skill and a goal of "resting" today.  Milbert Coulter 04/19/2016, 3:56 PM

## 2016-04-19 NOTE — BHH Group Notes (Signed)
[place] Group Therapy Notes:  (Clinical Social Work)  04/19/2016  11:15-12:00PM  Summary of Progress/Problems:   Today's process group involved patients discussing specific plans for how to stay well and thus out of the hospital. Among the ideas discussed were staying on medications through using a pillbox, phone alarm, accountability partner, chart, or journal as well as going to doctors' appointments.  Also listed were hobbies, humor, communicating honestly with supports, telling the doctor if medications seem to stop working, having an actual toolbox filled with activities such as music/videos/coloring sheets, and posting positive quotes and/or affirmations around the home. The patient expressed a primary plan for how to stay well upon hospital discharge is to get her prescriptions filled and stay on her medication.  She engaged fully in the discussion and showed appreciation for the comments that others made.  Type of Therapy:  Group Therapy - Process  Participation Level:  Active  Participation Quality:  Appropriate, Attentive, Sharing and Supportive  Affect:  Anxious and Blunted  Cognitive:  Appropriate  Insight:  Engaged  Engagement in Therapy:  Engaged  Modes of Intervention:  Exploration, Discussion  Selmer Dominion, LCSW 04/19/2016, 12:41 PM

## 2016-04-19 NOTE — Progress Notes (Signed)
Pt resting in bed with eyes closed.  No distress observed.  Respirations even/unlabored.  Continue 1:1 for safety.  Sitter at bedside.  Pt safe at this time.

## 2016-04-19 NOTE — Progress Notes (Signed)
D: Pt presents with flat affect and depressed mood. Denies SI, HI, AVH and pain when assessed, replied "no, no, no" with avertive eye contact. Pt continues to be minimal in her interactions with staff and peers.  Reported sleeping well last night, with good appetite, low energy and poor concentration level. Rated her depression, hopelessness and anxiety all 0/10 on self inventory sheet. A: Scheduled medications administered as prescribed with verbal education. Encouraged pt to voice concerns, attend unit groups and comply with current treatment regimen. 1:1 observation continues as ordered for safety without outburst.  R: Pt receptive to care. Took her medications without issues. Denies adverse drug reactions when assessed. Observed in Am group on unit. Denies concerns at this time. POC maintained for safety and mood stability.

## 2016-04-19 NOTE — Progress Notes (Signed)
D: Pt approached staff at approximately 1310 "can I talk to you, I don't know what's going on but I feel confused, I feel like I need my medications adjusted". Pt was unable to process the date and time which she correctly answered earlier this AM. Presented fidgety and restless at the time. Pt showered and changed clothes.  A: PRN Vistaril given as ordered for anxiety and was effecttive. Dr. Shea Evans was made aware of pt's status and further assessment was done. Continued support and encouragement provided to pt. 1:1 precautions maintained as ordered with assigned staff in attendance at all times. R: Pt receptive to care. Reported relief from anxiety with PRN Vistaril. Tolerated lunch well. Remains safe on unit. POC continues for safety and mood stability.
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
## 2016-04-20 MED ORDER — LORAZEPAM 2 MG/ML IJ SOLN: 0.5000 mg | Freq: Every evening | INTRAMUSCULAR | Status: DC

## 2016-04-20 MED ORDER — LORAZEPAM 0.5 MG PO TABS
0.5000 mg | ORAL_TABLET | Freq: Every evening | ORAL | Status: DC
  Administered 2016-04-21: 0.5 mg via ORAL
  Filled 2016-04-20: qty 1

## 2016-04-20 MED ORDER — TRAZODONE HCL 100 MG PO TABS
100.0000 mg | ORAL_TABLET | Freq: Every evening | ORAL | Status: DC | PRN
  Administered 2016-04-20 – 2016-04-27 (×6): 100 mg via ORAL
  Filled 2016-04-20 (×5): qty 1

## 2016-04-20 NOTE — Progress Notes (Signed)
[facility] MD Progress Note  04/20/2016 3:19 PM Charlotte Wise  MRN:  [id number] Subjective:  Patient states " I do have these periods when I am anxious and feel overwhelmed , but I feel like I can think more clearly now.'     Objective: Charlotte Wise is a 74 y old CF, who is married , lives with husband and 32 y old son in [place] , who has a hx of depression during post partum period, never been treated , presented to [facility] through [facility]PD , for psychosis and thoughts of harming her husband and son.  Patient seen and chart reviewed.Discussed patient with treatment team.  Pt today seen as anxious , but is able to cope better. Pt continues to have thought blocking often , but is more verbal and is able to express how she feels better. Pt reports medications are making her groggy - discussed that will readjust her dose and taper ativan down. Reassured patient . Per RN - pt has been taking her medications PO and is seen as taking her meals and is more visible in milieu. Continue 1:1 precaution since she tried to strangle self twice after admission.     Principal Problem: Schizophrenia, catatonic type [facility]) Diagnosis:   Patient Active Problem List   Diagnosis Date Noted  . 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 Allergic rhinitis [J30.9] 05/05/2007  . HYPERSOMNIA, IDIOPATHIC [G47.10] 01/13/2007   Total Time spent with patient: 25 minutes  Past Psychiatric History: Please see H&P.   Past Medical History:  Past Medical History:  Diagnosis Date  . Allergy   . Anxiety   . Arthritis   . Major depressive disorder, 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[place]) 04/15/2016  . Thyroid disease     Past Surgical History:  Procedure Laterality Date  . ANKLE ARTHROSCOPY Right 2000  . KNEE ARTHROSCOPY Left 1988  . KNEE ARTHROSCOPY Right 1999  . LAPAROSCOPY  2001  . NASAL POLYP SURGERY  2006  . TONSILLECTOMY AND ADENOIDECTOMY  1981   Family History:  Family History  Problem Relation  Age of Onset  . Cancer Maternal Grandmother   . Hyperlipidemia Paternal Grandfather   . Miscarriages / Stillbirths Paternal Grandfather   . Heart disease Paternal Grandfather   . Clotting disorder Mother   . Kidney disease Maternal Grandfather   . Mental illness Neg Hx    Family Psychiatric  History: Please see H&P.  Social History: Please see H&P.  History  Alcohol Use No     History  Drug Use No    Social History   Social History  . Marital status: Married    Spouse name: N/A  . Number of children: N/A  . Years of education: N/A   Social History Main Topics  . Smoking status: Never Smoker  . Smokeless tobacco: Never Used  . Alcohol use No  . Drug use: No  . Sexual activity: Not Asked   Other Topics Concern  . None   Social History Narrative  . None   Additional Social History:                         Sleep: Fair  Appetite:  improving  Current Medications: Current Facility-Administered Medications  Medication Dose Route Frequency Provider Last Rate Last Dose  . acetaminophen (TYLENOL) tablet 650 mg  650 mg Oral Q6H PRN [name], NP      . alum & mag hydroxide-simeth (MAALOX/MYLANTA) 200-200-20 MG/5ML  suspension 30 mL  30 mL Oral Q4H PRN [name], NP   30 mL at 04/17/16 1310  . feeding supplement (ENSURE ENLIVE) (ENSURE ENLIVE) liquid 237 mL  237 mL Oral BID BM Fernando A Cobos, MD   237 mL at 04/16/16 1436  . haloperidol (HALDOL) tablet 5 mg  5 mg Oral BID [name], MD   5 mg at 04/20/16 [id number]   Or  . haloperidol lactate (HALDOL) injection 5 mg  5 mg Intramuscular BID [name], MD      . hydrOXYzine (ATARAX/VISTARIL) tablet 25 mg  25 mg Oral TID PRN [name], NP   25 mg at 04/19/16 1322  . levothyroxine (SYNTHROID, LEVOTHROID) tablet 75 mcg  75 mcg Oral QAC breakfast [name], NP   75 mcg at 04/20/16 [id number]  . [START ON 04/21/2016] LORazepam (ATIVAN) tablet 0.5 mg  0.5 mg Oral QPM Twana Wileman, MD       Or  .  Derrill Memo ON 04/21/2016] LORazepam (ATIVAN) injection 0.5 mg  0.5 mg Intramuscular QPM Shaniqwa Horsman, MD      . LORazepam (ATIVAN) tablet 1 mg  1 mg Oral Q6H PRN [name], MD       Or  . LORazepam (ATIVAN) injection 1 mg  1 mg Intramuscular Q6H PRN Monea Pesantez, MD      . magnesium hydroxide (MILK OF MAGNESIA) suspension 30 mL  30 mL Oral Daily PRN [name], NP      . multivitamin with minerals tablet 1 tablet  1 tablet Oral Daily [name], NP   1 tablet at 04/20/16 [phone]  . traZODone (DESYREL) tablet 100 mg  100 mg Oral QHS PRN [name], MD      . ziprasidone (GEODON) capsule 20 mg  20 mg Oral TID PRN [name], MD       Or  . ziprasidone (GEODON) injection 10 mg  10 mg Intramuscular TID PRN [name], MD        Lab Results:  No results found for this or any previous visit (from the past 44 hour(s)).  Blood Alcohol level:  Lab Results  Component Value Date   ETH <5 Q000111Q    Metabolic Disorder Labs: Lab Results  Component Value Date   HGBA1C 5.1 04/16/2016   MPG 100 04/16/2016   MPG 103 10/12/2015   Lab Results  Component Value Date   PROLACTIN 23.3 04/16/2016   Lab Results  Component Value Date   CHOL 160 04/16/2016   TRIG 71 04/16/2016   HDL 51 04/16/2016   CHOLHDL 3.1 04/16/2016   VLDL 14 04/16/2016   LDLCALC 95 04/16/2016    Physical Findings: AIMS: Facial and Oral Movements Muscles of Facial Expression: None, normal Lips and Perioral Area: None, normal Jaw: None, normal Tongue: None, normal,Extremity Movements Upper (arms, wrists, hands, fingers): None, normal Lower (legs, knees, ankles, toes): None, normal, Trunk Movements Neck, shoulders, hips: None, normal, Overall Severity Severity of abnormal movements (highest score from questions above): None, normal Incapacitation due to abnormal movements: None, normal Patient's awareness of abnormal movements (rate only patient's report): No Awareness, Dental Status Current  problems with teeth and/or dentures?: No Does patient usually wear dentures?: No  CIWA:    COWS:     Musculoskeletal: Strength & Muscle Tone: within normal limits Gait & Station: normal Patient leans: N/A  Psychiatric Specialty Exam: Physical Exam  Nursing note and vitals reviewed.   Review of Systems  Psychiatric/Behavioral: Positive  for depression. The patient is nervous/anxious.   All other systems reviewed and are negative.   Blood pressure (!) 126/92, pulse 90, temperature 98.4 F (36.9 C), resp. rate 18, height 5\' 5"  (1.651 m), weight 71.3 kg (157 lb 3 oz), last menstrual period 04/02/2016, SpO2 98 %.Body mass index is 26.16 kg/m.  General Appearance: Guarded  Eye Contact:  Fair  Speech:  Blocked  Volume:  Decreased  Mood:  Anxious and Dysphoric  Affect:  Depressed  Thought Process:  Disorganized - improving and Descriptions of Associations:linear  Orientation:  Full (Time, Place, and Person)  Thought Content:  Delusions and talks about a force that makes her do things  Suicidal Thoughts:  did not express any- however has tried to strangle self twice after being admitted  Homicidal Thoughts:  did not express any  Memory:  Immediate;   Fair Recent;   Poor Remote;   Poor  Judgement:  Impaired  Insight:  Shallow  Psychomotor Activity:  Normal  Concentration:  Concentration: Fair and Attention Span: Poor  Recall:  Poor  Fund of Knowledge:  Fair  Language:  Fair  Akathisia:  No  Handed:  Right  AIMS (if indicated):   0  Assets:  Social Support  ADL's:  Intact  Cognition:  WNL  Sleep:  Number of Hours: 6.75     Treatment Plan Summary:Patient today continues to have thought blocking at times and has anxiety often - continue 1:1 precaution since she tried to strangle herself twice this admission.     Patient continues to be on forced medication order - started - 1/24 /18.  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   Daily contact with patient to assess and evaluate symptoms and progress in treatment and Medication management schizophrenia  Will continue Haldol 5 mg PO/IM bid for psychosis. Will continue Cogentin 0.5 mg PO/IM bid for EPS.  For catatonic sx: Will continue to reduce Ativan to 0.5 mg PO/IM daily .  For sleep problems: Make Trazodone 100 mg po qhs prn .  For anxiety/depression: Discussed an AD - like Celexa- pt declines - states she did not tolerate them in the past.  For anxiety/agitation: Started PRN medications as per agitation protocol.  Continue 1:1 precaution for safety/fall risk.  Collateral information obtained from family - see H&P.  Reviewed past medical records,treatment plan.   Will continue to monitor vitals ,medication compliance and treatment side effects while patient is here.   Will monitor for medical issues as well as call consult as needed.   Reviewed labs  uds - positive for BZD( likely given in ED), TSH - WNL, LIPID PANEL - wnl , AST - elevated - likely due to OTC medications used , will monitor , pregnancy test - negative , vitamin b12 - high , folate - wnl , RPR - NR., CMP - Bun -borderline high.  I have reviewed EKG for qtc - wnl.  CSW will continue working on disposition.   Patient to participate in therapeutic milieu .     Zerick Prevette, MD 04/20/2016, 3:19 PM
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
## 2016-04-20 NOTE — Progress Notes (Signed)
1:1 NSG  D.  Pt resting in bed with eyes closed, respirations even and unlabored.  No distress noted.  A.  1:1 continued as ordered for Pt safety (see MHT notes at bedside).  R.  Pt remains safe on the unit, will continue to monitor.

## 2016-04-20 NOTE — Progress Notes (Signed)
Nursing 1:1 Note: 7-7p  D- Mood is depressed and anxious, guarded with thought blocking. Affect is flat and appropriate. Pt is able to contract for safety. Pt was able to take a nap today. Ate fruit, salad and french fries for lunch.Pt had 2 glasses of nut milk Goal for today is Participate in group  A - Observed pt interacting in group and in the milieu with encouragement.Support and encouragement offered, 1:1 maintained. Group discussion included healthy support system.  R-Contracts for safety and continues to follow treatment plan, working on learning new coping skills for anxiety.

## 2016-04-20 NOTE — Progress Notes (Signed)
1:1 NSG note:  D. Pt flat but pleasant on approach, complaint of insomnia.  Pt came up to get Trazodone tonight and is hopeful this will help.  Pt denies SI/HI/hallucinations at this time.  A.  1:1 continued as ordered for Pt safety  R. Pt remains safe, will continue to monitor.

## 2016-04-20 NOTE — B[facility] Group Notes (Signed)
[facility] Group Notes:  (Clinical Social Work)  04/20/2016  11:00AM-12:00PM  Summary of Progress/Problems:  The main focus of today's process group was to listen to a variety of genres of music and to identify that different types of music provoke different responses.  The patient then was able to identify personally what was soothing for them, as well as energizing, as well as how patient can personally use this knowledge in sleep habits, with depression, and with other symptoms.  The patient expressed at the beginning of group the overall feeling of "less affected by the medications today, more normal."  She interacted well throughout group, focusing on feelings.  Type of Therapy:  Music Therapy   Participation Level:  Active  Participation Quality:  Attentive and Sharing  Affect:  Blunted  Cognitive:  Oriented  Insight:  Engaged  Engagement in Therapy:  Engaged  Modes of Intervention:   Activity, Exploration  Selmer Dominion, LCSW 04/20/2016

## 2016-04-20 NOTE — Progress Notes (Signed)
1:1 NSG note:  D.  Pt has appeared to rest well this shift, no distress or discomfort noted.  A.  1:1 continued as ordered for Pt safety.  R.  Pt remains safe on unit.  Will continue to monitor.

## 2016-04-20 NOTE — Progress Notes (Signed)
Nursing !:1 note : Affect is flat and guarded. Pt is cooperative, refused breakfast and ensure. "I'm a vegetarian and I don't eat wheat but I will eat fruit." Pt ate a banana and orange that husband brought along with soy milk. Maintained on 1:1.

## 2016-04-20 NOTE — Progress Notes (Signed)
Nursing 1:1 note : Continues to show thought blocking, pt is guarded and aloof at times. Pt is cooperative with medications. Remains on 1:1. Pt' appetite is good following a vegan diet.

## 2016-04-21 ENCOUNTER — Encounter: Payer: Self-pay | Admitting: Internal Medicine

## 2016-04-21 DIAGNOSIS — G4700 Insomnia, unspecified: Secondary | ICD-10-CM

## 2016-04-21 MED ORDER — LORAZEPAM 1 MG PO TABS
1.0000 mg | ORAL_TABLET | Freq: Once | ORAL | Status: AC
  Administered 2016-04-21: 1 mg via ORAL

## 2016-04-21 NOTE — Progress Notes (Signed)
[facility] MD Progress Note  04/21/2016 11:30 AM Charlotte Wise  MRN:  [id number]     Subjective:  Patient states " I am feeling okay. Still a little drowsy from night time medcaitons I think."  Objective: Charlotte Wise is awake, alert and oriented *3 Seen on 1:1 for safety. (per progress note -Continue 1:1 precaution since she tried to strangle self twice after admission.) Denies suicidal or homicidal ideation. Denies auditory or visual hallucination and does not appear to be responding to internal stimuli.  Patient appears to have mild thought blocking with responses. Patient reports " I was unable to sleep on last night due a new admission has my mind racing." Patient present with a flat and guarded affect. Patient reports she is medication compliant without mediation side effects.  Reports good appetite Support, encouragement and reassurance was provided.   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 Allergic rhinitis [J30.9] 05/05/2007  . HYPERSOMNIA, IDIOPATHIC [G47.10] 01/13/2007   Total Time spent with patient: 25 minutes  Past Psychiatric History: Please see H&P.   Past Medical History:  Past Medical History:  Diagnosis Date  . Allergy   . Anxiety   . Arthritis   . Major depressive disorder, 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[place]) 04/15/2016  . Thyroid disease     Past Surgical History:  Procedure Laterality Date  . ANKLE ARTHROSCOPY Right 2000  . KNEE ARTHROSCOPY Left 1988  . KNEE ARTHROSCOPY Right 1999  . LAPAROSCOPY  2001  . NASAL POLYP SURGERY  2006  . TONSILLECTOMY AND ADENOIDECTOMY  1981   Family History:  Family History  Problem Relation Age of Onset  . Cancer Maternal Grandmother   . Hyperlipidemia Paternal Grandfather   . Miscarriages / Stillbirths Paternal Grandfather   . Heart disease Paternal Grandfather   .  Clotting disorder Mother   . Kidney disease Maternal Grandfather   . Mental illness Neg Hx    Family Psychiatric  History: Please see H&P.  Social History: Please see H&P.  History  Alcohol Use No     History  Drug Use No    Social History   Social History  . Marital status: Married    Spouse name: N/A  . Number of children: N/A  . Years of education: N/A   Social History Main Topics  . Smoking status: Never Smoker  . Smokeless tobacco: Never Used  . Alcohol use No  . Drug use: No  . Sexual activity: Not Asked   Other Topics Concern  . None   Social History Narrative  . None   Additional Social History:                         Sleep: Fair  Appetite:  improving  Current Medications: Current Facility-Administered Medications  Medication Dose Route Frequency Provider Last Rate Last Dose  . acetaminophen (TYLENOL) tablet 650 mg  650 mg Oral Q6H PRN [name], NP      . alum & mag hydroxide-simeth (MAALOX/MYLANTA) 200-200-20 MG/5ML suspension 30 mL  30 mL Oral Q4H PRN [name], NP   30 mL at 04/17/16 1310  . feeding supplement (ENSURE ENLIVE) (ENSURE ENLIVE) liquid 237 mL  237 mL Oral BID BM [name], MD   237 mL at 04/16/16 1436  . haloperidol (HALDOL) tablet 5 mg  5 mg Oral BID [name], MD   5 mg at 04/21/16 1022   Or  . haloperidol lactate (HALDOL) injection 5 mg  5 mg Intramuscular BID [name], MD      . hydrOXYzine (ATARAX/VISTARIL) tablet 25 mg  25 mg Oral TID PRN [name], NP   25 mg at 04/19/16 1322  . levothyroxine (SYNTHROID, LEVOTHROID) tablet 75 mcg  75 mcg Oral QAC breakfast [name], NP   75 mcg at 04/21/16 1022  . LORazepam (ATIVAN) tablet 0.5 mg  0.5 mg Oral QPM [name], MD       Or  . LORazepam (ATIVAN) injection 0.5 mg  0.5 mg Intramuscular QPM [name], MD      . LORazepam (ATIVAN) tablet 1 mg  1 mg Oral Q6H PRN [name], MD       Or  . LORazepam (ATIVAN) injection 1 mg  1  mg Intramuscular Q6H PRN [name], MD      . magnesium hydroxide (MILK OF MAGNESIA) suspension 30 mL  30 mL Oral Daily PRN [name], NP      . multivitamin with minerals tablet 1 tablet  1 tablet Oral Daily [name], NP   1 tablet at 04/21/16 1022  . traZODone (DESYREL) tablet 100 mg  100 mg Oral QHS PRN [name], MD   100 mg at 04/20/16 2120  . ziprasidone (GEODON) capsule 20 mg  20 mg Oral TID PRN [name], MD       Or  . ziprasidone (GEODON) injection 10 mg  10 mg Intramuscular TID PRN [name], MD        Lab Results:  No results found for this or any previous visit (from the past 28 hour(s)).  Blood Alcohol level:  Lab Results  Component Value Date   ETH <5 [id number]    Metabolic Disorder Labs: Lab Results  Component Value Date   HGBA1C 5.1 04/16/2016   MPG 100 04/16/2016   MPG 103 10/12/2015   Lab Results  Component Value Date   PROLACTIN 23.3 04/16/2016   Lab Results  Component Value Date   CHOL 160 04/16/2016   TRIG 71 04/16/2016   HDL 51 04/16/2016   CHOLHDL 3.1 04/16/2016   VLDL 14 04/16/2016   LDLCALC 95 04/16/2016    Physical Findings: AIMS: Facial and Oral Movements Muscles of Facial Expression: None, normal Lips and Perioral Area: None, normal Jaw: None, normal Tongue: None, normal,Extremity Movements Upper (arms, wrists, hands, fingers): None, normal Lower (legs, knees, ankles, toes): None, normal, Trunk Movements Neck, shoulders, hips: None, normal, Overall Severity Severity of abnormal movements (highest score from questions above): None, normal Incapacitation due to abnormal movements: None, normal Patient's awareness of abnormal movements (rate only patient's report): No Awareness, Dental Status Current problems with teeth and/or dentures?: No Does patient usually wear dentures?: No  CIWA:    COWS:     Musculoskeletal: Strength & Muscle Tone: within normal limits Gait & Station: normal Patient leans:  N/A  Psychiatric Specialty Exam: Physical Exam  Nursing note and vitals reviewed. Constitutional: She is oriented to person, place, and time.  Cardiovascular: Normal rate.   Neurological: She is alert and oriented to person, place, and time.  Psychiatric: She has a normal mood and affect. Her behavior is normal.    Review of Systems  Psychiatric/Behavioral: Positive for depression. The patient is nervous/anxious.   All other systems reviewed and are negative.   Blood pressure Marland Kitchen)  126/92, pulse 90, temperature 98.4 F (36.9 C), resp. rate 18, height 5\' 5"  (1.651 m), weight 71.3 kg (157 lb 3 oz), last menstrual period 04/02/2016, SpO2 98 %.Body mass index is 26.16 kg/m.  General Appearance: Casual and Guarded  Eye Contact:  Fair  Speech:  Blocked  Volume:  Decreased  Mood:  Anxious and Dysphoric  Affect:  Depressed  Thought Process:  Disorganized - improving and Descriptions of Associations:linear  Orientation:  Full (Time, Place, and Person)  Thought Content:  Delusions, Paranoid Ideation and talks about a force that makes her do things  Suicidal Thoughts:  did not express any- however has tried to strangle self twice after being admitted  Homicidal Thoughts:  did not express any  Memory:  Immediate;   Fair Recent;   Poor Remote;   Poor  Judgement:  Impaired  Insight:  Shallow  Psychomotor Activity:  Normal  Concentration:  Concentration: Fair and Attention Span: Poor  Recall:  Poor  Fund of Knowledge:  Fair  Language:  Fair  Akathisia:  No  Handed:  Right  AIMS (if indicated):   0  Assets:  Social Support  ADL's:  Intact  Cognition:  WNL  Sleep:  Number of Hours: 6     I agree with current treatment plan on 04/21/2016, Patient seen face-to-face for psychiatric evaluation follow-up, chart reviewed. Reviewed the information documented and agree with the treatment plan.  Treatment Plan Summary: Patient continues to be on forced medication order - started - 1/24  /18.  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 Daily contact with patient to assess and evaluate symptoms and progress in treatment and Medication management   schizophrenia  Will continue Haldol 5 mg PO/IM bid for psychosis. Will continue Cogentin 0.5 mg PO/IM bid for EPS.  For catatonic sx: Will continue to reduce Ativan to 0.5 mg PO/IM daily .  For sleep problems: Make Trazodone 100 mg po qhs prn .  For anxiety/depression: Discussed an AD - like Celexa- pt declines - states she did not tolerate them in the past.  For anxiety/agitation: Started PRN medications as per agitation protocol.  Continue 1:1 precaution for safety/fall risk.  Collateral information obtained from family - see H&P.  Reviewed past medical records,treatment plan.   Will continue to monitor vitals ,medication compliance and treatment side effects while patient is here.   Will monitor for medical issues as well as call consult as needed.   Reviewed labs  uds - positive for BZD( likely given in ED), TSH - WNL, LIPID PANEL - wnl , AST - elevated - likely due to OTC medications used , will monitor , pregnancy test - negative , vitamin b12 - high , folate - wnl , RPR - NR., CMP - Bun -borderline high.  I have reviewed EKG for qtc - wnl. CSW will continue working on disposition.  Patient to participate in therapeutic milieu .    Derrill Center, NP 04/21/2016, 11:30 AM   Agree with NP Progress Note

## 2016-04-21 NOTE — Tx Team (Signed)
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 acetaminophen (TYLENOL) tablet 650 mg  650 mg Oral Q6H PRN [name], NP      . alum & mag hydroxide-simeth (MAALOX/MYLANTA) 200-200-20 MG/5ML suspension 30 mL  30 mL Oral Q4H PRN [name], NP   30 mL at 04/17/16 1310  . feeding supplement (ENSURE ENLIVE) (ENSURE ENLIVE) liquid 237 mL  237 mL Oral BID BM [name], MD   237 mL at 04/16/16 1436  . haloperidol (HALDOL) tablet 5 mg  5 mg Oral BID [name], MD   5 mg at 04/21/16 1633   Or  . haloperidol lactate (HALDOL) injection 5 mg  5 mg Intramuscular BID [name], MD      . hydrOXYzine (ATARAX/VISTARIL) tablet 25 mg  25 mg Oral TID PRN [name], NP   25 mg at 04/19/16 1322  . levothyroxine (SYNTHROID, LEVOTHROID) tablet 75 mcg  75 mcg Oral QAC breakfast [name], NP   75 mcg at 04/21/16 1022  . LORazepam (ATIVAN) tablet 0.5 mg  0.5 mg Oral QPM [name], MD   0.5 mg at 04/21/16 1733   Or  . LORazepam (ATIVAN) injection 0.5 mg  0.5 mg Intramuscular QPM [name], MD      . LORazepam (ATIVAN) tablet 1 mg  1 mg Oral Q6H PRN [name], MD       Or  . LORazepam (ATIVAN) injection 1 mg  1 mg Intramuscular Q6H PRN [name], MD      . magnesium hydroxide (MILK OF MAGNESIA) suspension 30 mL  30 mL Oral Daily PRN [name], NP      . multivitamin with minerals tablet 1 tablet  1 tablet Oral Daily [name], NP   1 tablet at 04/21/16 1022  . traZODone (DESYREL) tablet 100 mg  100 mg Oral QHS PRN [name], MD   100 mg at 04/20/16 2120  . ziprasidone (GEODON) capsule 20 mg  20 mg Oral  TID PRN [name], MD       Or  . ziprasidone (GEODON) injection 10 mg  10 mg Intramuscular TID PRN [name], MD        PTA Medications: Prescriptions Prior to Admission  Medication Sig Dispense Refill Last Dose  . carisoprodol (SOMA) 350 MG tablet TAKE 1 TABLET BY MOUTH EVERY 8 HOURS AS NEEDED FOR MUSCLE SPASMS (Patient not taking: Reported on 04/14/2016) 30 tablet 0 Not Taking at Unknown time  . levothyroxine (SYNTHROID, LEVOTHROID) 75 MCG tablet Take 1 tablet (75 mcg total) by mouth daily before breakfast. 90 tablet 1 04/14/2016 at Unknown time  . Multiple Vitamin (MULTIVITAMIN WITH MINERALS) TABS tablet Take 1 tablet by mouth daily.   Past Week at Unknown time  . pregabalin (LYRICA) 50 MG capsule Take 1 capsule (50 mg total) by mouth daily. 90 capsule 2 Past Month at Unknown time  . rifaximin (XIFAXAN) 550 MG TABS tablet Take 1 tablet (550 mg total) by mouth 3 (three) times daily. (Patient not taking: Reported on 04/14/2016) 42 tablet 0 Not Taking at Unknown time  . temazepam (RESTORIL) 30 MG capsule TAKE ONE CAPSULE BY MOUTH AS NEEDED FOR  SLEEP 90 capsule 0 Past Month at Unknown time  . triamcinolone cream (KENALOG) 0.1 % Apply 1 application topically 4 (four) times daily as needed. (Patient not taking: Reported on 04/14/2016) 30 g 0 Not Taking at Unknown time    Treatment Modalities: Medication Management, Group therapy, Case management,  1 to 1 session with clinician, Psychoeducation, Recreational therapy.   Physician Treatment Plan for Primary Diagnosis: Schizophrenia, catatonic type ([place]) Long Term Goal(s): Improvement in symptoms so as ready for discharge  Short Term Goals: Ability to verbalize feelings will improve Ability to disclose and discuss suicidal ideas Ability to demonstrate self-control will improve   Medication Management: Evaluate patient's response, side effects, and tolerance of medication regimen.  Therapeutic Interventions: 1 to 1 sessions, Unit Group  sessions and Medication administration.  Evaluation of Outcomes: Progressing  Physician Treatment Plan for Secondary Diagnosis: Principal Problem:   Schizophrenia, catatonic type ([place])   Long Term Goal(s): Improvement in symptoms so as ready for discharge  Short Term Goals: Ability to verbalize feelings will improve Ability to disclose and discuss suicidal ideas Ability to demonstrate self-control will improve   Medication Management: Evaluate patient's response, side effects, and tolerance of medication regimen.  Therapeutic Interventions: 1 to 1 sessions, Unit Group sessions and Medication administration.  Evaluation of Outcomes: Progressing   04/21/16: Will change Haldol to 7.5 mg po qhs for psychosis. Will continue Cogentin 0.5 mg po qhs for EPS.  For catatonic sx:( RESOLVED)  Will discontinue Ativan due to drowsiness.  For anxiety sx: Discussed adding an SSRI - but patient reports she tried it in the past for postpartum depression and had ADRs, she declines .  For sleep problems: Trazodone 100 mg po qhs prn .  RN Treatment Plan for Primary Diagnosis: Schizophrenia, catatonic type ([place]) Long Term Goal(s): Knowledge of disease and therapeutic regimen to maintain health will improve  Short Term Goals: Ability to disclose and discuss suicidal ideas and Compliance with prescribed medications will improve  Medication Management: RN will administer medications as ordered by provider, will assess and evaluate patient's response and provide education to patient for prescribed medication. RN will report any adverse and/or side effects to prescribing provider.  Therapeutic Interventions: 1 on 1 counseling sessions, Psychoeducation, Medication administration, Evaluate responses to treatment, Monitor vital signs and CBGs as ordered, Perform/monitor CIWA, COWS, AIMS and Fall Risk screenings as ordered, Perform wound care treatments as ordered.  Evaluation of Outcomes: Progressing    Recreational Therapy Treatment Plan for Primary Diagnosis: Schizophrenia, catatonic type ([place]) Long Term Goal(s): LTG- Patient will participate in recreation therapy tx in at least 2 group sessions without prompting from LRT.  Short Term Goals: Patient will be able to identify at least 5 coping skills for admitting dx by conclusion of recreation therapy tx.  Treatment Modalities: Group and Pet Therapy  Therapeutic Interventions: Psychoeducation  Evaluation of Outcomes: Progressing   LCSW Treatment Plan for Primary Diagnosis: Schizophrenia, catatonic type ([place]) Long Term Goal(s): Safe transition to appropriate next level of care at discharge, Engage patient in therapeutic group addressing interpersonal concerns.  Short Term Goals: Engage patient in aftercare planning with referrals and resources  Therapeutic Interventions: Assess for all discharge needs, 1 to 1 time with Social worker, Explore available resources and support systems, Assess for adequacy in community support network, Educate family and significant other(s) on suicide prevention, Complete Psychosocial Assessment, Interpersonal group therapy.  Evaluation of Outcomes: Met  Return home, follow up outpt    Progress in Treatment: Attending groups: Yes  Participating in groups: No Taking medication as prescribed: Yes Toleration medication: Yes, no side effects reported at this time Family/Significant other contact made: Yes Patient understands diagnosis: No  Limited insight  Discussing patient identified problems/goals with staff: Yes Medical problems stabilized or resolved: Yes Denies suicidal/homicidal ideation: Yes Issues/concerns per patient self-inventory: None Other: N/A  New problem(s) identified: None identified at this time.   New Short Term/Long Term Goal(s): None identified at this time.   Discharge Plan or Barriers:   Reason for Continuation of Hospitalization: Delusions  Medication  stabilization    Estimated Length of Stay: 2-4 days  Attendees: Patient: 04/21/2016  4:45 PM  Physician: [name], MD 04/21/2016  4:45 PM  Nursing: Jeanie Cooks, RN 04/21/2016  4:45 PM  RN Care Manager: Lars Pinks, RN 04/21/2016  4:45 PM  Social Worker: Ripley Fraise 04/21/2016  4:45 PM  Recreational Therapist: Laretta Bolster  04/21/2016  4:45 PM  Other: Norberto Sorenson 04/21/2016  4:45 PM  Other:  04/21/2016  4:45 PM    Scribe for Treatment Team:  Roque Lias LCSW 04/21/2016 4:45 PM

## 2016-04-21 NOTE — Progress Notes (Signed)
1-1 note Charlotte Wise is in bed and resting comfortably at this time. She did have visit from family earlier and spoke with staff about how she did not sleep well last night and complained of racing thoughts. She does appear guarded and minimally interacts with staff or peers, denies any SI and does not verbalize any complaints of pain. A. 1-1 continues at this time. R. Safety maintained, will continue to monitor.

## 2016-04-21 NOTE — Progress Notes (Signed)
1:1 Note: Patient maintained on constant supervision for safety.  Patient is visible in the dayroom for activities and therapy.  Affect is flat and mood is depressed.  Minimal interaction with staff.  Food and fluid intake encouraged.  Patient offered support and encouragement as needed.  Routine safety checks continues.  Patient is safe with supervision.

## 2016-04-21 NOTE — Progress Notes (Signed)
1:1 NSG note:  D.  Pt has been up hourly reporting that she can not sleep.  This staff member spoke with NP and received order for additional dose of Ativan 1 mg.  Pt took this and returned to bed and appears at this time to be sleeping.  Respirations even and unlabored, no distress noted.  See MHT notes at bedside.  A.  1:1 continued as ordered for Pt safety.  R. Pt remains safe on unit, will continue to monitor.

## 2016-04-21 NOTE — Progress Notes (Signed)
1:1 Note: Patient maintained on constant supervision for safety.  Patient presents with flat affect and depressed mood.  Denies suicidal ideation, auditory and visual hallucinations.  Medication given as prescribed.  100% of meal taken, fluid intake encouraged.  Routine safety checks continues.  Patient safe with supervision.  Gait is slow and steady.

## 2016-04-21 NOTE — Progress Notes (Signed)
1:1 Note: Patient maintained on constant supervision for safety.  No behavioral issues noted.  Patient ambulatory on the unit without difficulty.  Medication given as prescribed.  Routine safety checks continues.  Food and fluid intake encouraged.  Patient safe on the unit with supervision.  Patient off the unit for meals and activities.  Voiced no complaint.

## 2016-04-21 NOTE — BHH Counselor (Signed)
Husband states pt's mother will come and stay with patient at discharge.

## 2016-04-21 NOTE — Progress Notes (Signed)
Recreation Therapy Notes  Date: 04/21/16 Time: 1000 Location: 500 Hall Dayroom  Group Topic: Coping Skills  Goal Area(s) Addresses:  Patients will be able to identify the benefits of coping skills. Patients will be able to identify positive coping skills. Patients will be able to the benefits of using coping skills post d/c.  Intervention: Blank Public librarian, pencils  Activity: Building surveyor.  Patients were given a worksheet with a blank spider web on it.  Patients were visualize themselves in the center of the web.  Patients were to then write the obstacles they feel have them stuck within the web. After patients identified their obstacles, they were to come up with coping skills for each obstacles.  Education: Radiographer, therapeutic, Dentist.   Education Outcome: Acknowledges understanding/In group clarification offered/Needs additional education.   Clinical Observations/Feedback: Pt did not attend group.    Victorino Sparrow, LRT/CTRS         Victorino Sparrow A 04/21/2016 12:05 PM
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
## 2016-04-21 NOTE — Progress Notes (Signed)
1:1 NSG note:  D.  Pt has slept this last four hours since receiving second dose of Ativan.  Will not wake Pt for vital signs this morning since she slept so little last night.  Respirations unlabored, no distress noted.  A. 1:1 continued as ordered for Pt safety.  R.  Pt remains safe on the unit, will continue to monitor.

## 2016-04-22 MED ORDER — BENZTROPINE MESYLATE 0.5 MG PO TABS
0.5000 mg | ORAL_TABLET | Freq: Every day | ORAL | Status: DC
  Administered 2016-04-22: 0.5 mg via ORAL
  Filled 2016-04-22 (×3): qty 1

## 2016-04-22 MED ORDER — HALOPERIDOL 5 MG PO TABS
7.5000 mg | ORAL_TABLET | Freq: Every day | ORAL | Status: DC
  Administered 2016-04-22 – 2016-04-23 (×2): 7.5 mg via ORAL
  Filled 2016-04-22 (×4): qty 2

## 2016-04-22 NOTE — Progress Notes (Signed)
Adult Psychoeducational Group Note  Date:  04/22/2016 Time:  8:31 PM  Group Topic/Focus:  Wrap-Up Group:   The focus of this group is to help patients review their daily goal of treatment and discuss progress on daily workbooks.  Participation Level:  Active  Participation Quality:  Appropriate  Affect:  Appropriate  Cognitive:  Alert  Insight: Appropriate  Engagement in Group:  Engaged  Modes of Intervention:  Discussion  Additional Comments:  Patient states, "I had a good day". Patient's goal(s) for today was to (1) participate in group, (2) to talk to son.   Westin Knotts L Mishelle Hassan 04/22/2016, 8:31 PM

## 2016-04-22 NOTE — Progress Notes (Signed)
D   Pt requested her medications early and said she hopes this will be a good combination for her    She interacts minimally but has been appropriate A    Verbal support given   Medications administered and effectiveness monitored   Q 15 min checks R    Pt safe at present time

## 2016-04-22 NOTE — Progress Notes (Signed)
DAR NOTE: Patient presents with flat affect and depressed mood.  Denies pain, auditory and visual hallucinations.  Described energy level as normal and concentration as good.  Rates depression at 0, hopelessness at 0, and anxiety at 2.  Maintained on routine safety checks.  Medications given as prescribed.  Support and encouragement offered as needed.  Attended group and participated.  States goal for today is "group participation, tell my son I love him."  Offered no complaint.

## 2016-04-22 NOTE — Progress Notes (Signed)
1:1 Note: Patient maintained on constant supervision for safety.  Patient is alert and oriented x 4.  Patient ambulatory on the unit without difficulty.  Denies suicidal thoughts, auditory and visual hallucinations.  Food and fluid intake encouraged.  Medication given as prescribed.  Support and encouragements offered as needed.  Routine safety checks continues.

## 2016-04-22 NOTE — Progress Notes (Signed)
1-1 note Charlotte Wise is in room and in bed resting comfortably at this time. Respirations even and unlabored and she does not appear to be in any acute distress. A. 1-1 continued at this time R. Safety maintained, will continue to monitor.

## 2016-04-22 NOTE — Progress Notes (Signed)
Post 1:1 noteBHH Post 1:1 Observation Documentation  For the first (8) hours following discontinuation of 1:1 precautions, a progress note entry by nursing staff should be documented at least every 2 hours, reflecting the patient's behavior, condition, mood, and conversation.  Use the progress notes for additional entries.  Time 1:1 discontinued:  1730  Patient's Behavior: anxious , isolative  cooperative  Patient's Condition:  stable  Patient's Conversation:  Talking slow mild thought blocking  Charlotte Wise 04/22/2016, 10:09 PM

## 2016-04-22 NOTE — Progress Notes (Signed)
Observation Note: Patient maintained on closed observation for safety.  Patient is alert and oriented x 4.  Patient is calm and appropriate to situation.  Minimal interaction with staff.  Patient is visible in milieu.  Patient safe on the unit.

## 2016-04-22 NOTE — Progress Notes (Signed)
Closed observation note:  Patient placed on closed observation for safety.  Patient appropriate to situation.  Patient is calm, pleasant and cooperative.  Patient is visible in milieu for activities and therapy.  No behavioral issues noted or reported.  Patient is safe on the unit.

## 2016-04-22 NOTE — B[facility] Group Notes (Signed)
[facility] LCSW Group Therapy  04/22/2016 1:15 pm  Type of Therapy: Process Group Therapy  Participation Level:  Active  Participation Quality:  Appropriate  Affect:  Flat  Cognitive:  Oriented  Insight:  Improving  Engagement in Group:  Limited  Engagement in Therapy:  Limited  Modes of Intervention:  Activity, Clarification, Education, Problem-solving and Support  Summary of Progress/Problems: Today's group addressed the issue of overcoming obstacles.  Patients were asked to identify their biggest obstacle post d/c that stands in the way of their on-going success, and then problem solve as to how to manage this. Stayed the entire time, engaged throughout.  Talked about her obstacle of going home and trying to function while still feeling "foggy."  Is concerned that she will not be able to engage in activities which require thought and planning.  Other patients echoed their concerns about not liking how the medication makes them feels.  We talked about the "process,"  How one must work with outpt provider to find the balance, because, as one pt pointed out, "the symptoms without medication are just as bad, if not worse."  [name] 04/22/2016   1:38 PM
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
## 2016-04-22 NOTE — Progress Notes (Signed)
Post 1:1 noteBHH Post 1:1 Observation Documentation  For the first (8) hours following discontinuation of 1:1 precautions, a progress note entry by nursing staff should be documented at least every 2 hours, reflecting the patient's behavior, condition, mood, and conversation.  Use the progress notes for additional entries.  Time 1:1 discontinued:  1730  Patient's Behavior: anxious , isolative    Patient's Condition:  stable  Patient's Conversation:  Talking slow mild thought blocking  Migdalia Dk 04/22/2016, 7:37 PM

## 2016-04-22 NOTE — Progress Notes (Signed)
Recreation Therapy Notes  Date: 04/22/16 Time: 1100 Location: 500 [address] Dayroom  Group Topic: Self-Esteem  Goal Area(s) Addresses:  Patient will identify positive ways to increase self-esteem. Patient will verbalize benefit of increased self-esteem.  Behavioral Response: Engaged  Intervention: Blank crest, colored pencils, at least 6 topics  Activity: Patients were given a sheet with a blank crest on it.  The crest was divided into 4 areas.  LRT wrote 6 topics on the board for the patients to choose from.  Patients were to pick 4 of the 6 topics to represent something about them on their crest.    Education:  Self-Esteem, Discharge Planning.   Education Outcome: Acknowledges education/In group clarification offered/Needs additional education  Clinical Observations/Feedback: Pt was a little flat but was seen smiling at times during group.  Pt identified her best feature as her eyes because they show honesty; favorite activity was meditation; greatest accomplishment was her son and something she is good at is painting and speech writing.  Pt expressed focusing on her positives will help her "keep my mind in a good place to handle change/obstacles and move forward".   Victorino Sparrow, LRT/CTRS    Victorino Sparrow A 04/22/2016 12:49 PM

## 2016-04-22 NOTE — Progress Notes (Signed)
1-1 note Charlotte Wise is in room and in bed resting comfortably at this time. Respirations even and unlabored and she does not appear to be in any acute distress at this time. A. 1-1 continued at this time. R. Safety maintained, will continue to monitor.

## 2016-04-23 DIAGNOSIS — F2081 Schizophreniform disorder: Secondary | ICD-10-CM

## 2016-04-23 HISTORY — DX: Schizophreniform disorder: F20.81

## 2016-04-23 MED ORDER — BENZTROPINE MESYLATE 1 MG PO TABS
1.0000 mg | ORAL_TABLET | Freq: Every day | ORAL | Status: DC
  Administered 2016-04-23 – 2016-04-27 (×5): 1 mg via ORAL
  Filled 2016-04-23 (×9): qty 1

## 2016-04-23 MED ORDER — GABAPENTIN 100 MG PO CAPS
100.0000 mg | ORAL_CAPSULE | Freq: Three times a day (TID) | ORAL | Status: DC
  Administered 2016-04-23 – 2016-04-28 (×16): 100 mg via ORAL
  Filled 2016-04-23 (×22): qty 1

## 2016-04-23 MED ORDER — LORAZEPAM 2 MG/ML IJ SOLN: 0.5000 mg | Freq: Four times a day (QID) | INTRAMUSCULAR | Status: DC | PRN

## 2016-04-23 MED ORDER — LORAZEPAM 0.5 MG PO TABS
0.5000 mg | ORAL_TABLET | Freq: Four times a day (QID) | ORAL | Status: DC | PRN
Start: 2016-04-23 — End: 2016-04-28
  Administered 2016-04-23: 0.5 mg via ORAL
  Filled 2016-04-23: qty 1

## 2016-04-23 NOTE — Progress Notes (Signed)
Post observation 1:1 note  Discontinued   1730  Pt behavior   Cooperative   In bed resting with eyes closed  Pt condition   Stable  Pt conversation   No conversation  Pt is asleep

## 2016-04-23 NOTE — Progress Notes (Deleted)
Post 1:1 note Discontinued  1730 Pt behavior    In bed resting with eyes closed   Cooperative Pt condition   Stable Pt conversation   No conversation pt is asleep

## 2016-04-23 NOTE — BHH Group Notes (Signed)
[facility] LCSW Group Therapy  04/23/2016 2:28 PM   Type of Therapy:  Group Therapy   Participation Level:  Engaged  Participation Quality:  Attentive  Affect:  Appropriate   Cognitive:  Alert   Insight:  Engaged  Engagement in Therapy:  Improving   Modes of Intervention:  Education, Exploration, Socialization   Summary of Progress/Problems: Charlotte Wise was engaged throughout her stay, but was in and out of group session multiple times. She eventually left the group and did not return.   Shanon Brow from the [facility] was here to tell his story of recovery, inform patients about [facility] and play his guitar.   Radonna Ricker 04/23/2016 2:28 PM

## 2016-04-23 NOTE — Progress Notes (Signed)
Adult Psychoeducational Group Note  Date:  04/23/2016 Time:  8:19 PM  Group Topic/Focus:  Wrap-Up Group:   The focus of this group is to help patients review their daily goal of treatment and discuss progress on daily workbooks.  Participation Level:  Active  Participation Quality:  Appropriate  Affect:  Appropriate  Cognitive:  Alert  Insight: Appropriate  Engagement in Group:  Engaged  Modes of Intervention:  Discussion  Additional Comments:  Patient states, "I had a very good day". Patient's goal for today was to talk to the doctor about adjusting her medication.   Charlotte Wise 04/23/2016, 8:19 PM

## 2016-04-23 NOTE — Progress Notes (Signed)
D: Pt presents with flat affect and depressed mood. Pt appears anxious this morning. Pt concerned about med regimen. Pt educated on new order of Neurontin for anxiety. Pt receptive to taking med. Pt verbalized understanding of new med. Pt endorsing racing thoughts. Pt appears to have thought blocking and delayed responses. Pt denies AVH. Pt denies suicidal and homicidal thoughts. Pt compliant with taking meds and attending groups. A: Medications reviewed with pt. Medications administered as ordered per MD. Verbal support provided. Pt encouraged to attend groups. 15 minute checks performed for safety.  R: Pt receptive to tx.

## 2016-04-23 NOTE — Progress Notes (Signed)
[facility] MD Progress Note  04/23/2016 12:08 PM Charlotte Wise  MRN:  [id number]     Subjective:  Patient states "I feel like I cannot be still in my inner self anymore , its hard to focus when I am on this mediations."    Objective: Charlotte Wise is seen in her room. Pt seen as alert, oriented x3. Pt reports she continues to have anxiety , restlessness and cannot meditate like she used to before. Pt wonders if her medications have something to do with it . Per RN , pt required PRN medications last PM for anxiety/restlessness. Pt was able to discuss her sx to writer today - reported that her AH started few days prior to her admission. Reports hearing voices talking about things that she did and also had this feeling of force coming over her. Pt also reported some sleep issues, denied any mood sx prior to this. 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 Continue to readjust medications.    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 Schizophreniform disorder ([place]) [F20.81] 04/23/2016  . Allergic rhinitis [J30.9] 05/05/2007  . HYPERSOMNIA, IDIOPATHIC [G47.10] 01/13/2007   Total Time spent with patient: 25 minutes  Past Psychiatric History: Please see H&P.   Past Medical History:  Past Medical History:  Diagnosis Date  . Allergy   . Anxiety   . Arthritis   . 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 Thyroid disease     Past Surgical History:  Procedure Laterality Date  . ANKLE ARTHROSCOPY Right 2000  . KNEE ARTHROSCOPY Left 1988  . KNEE ARTHROSCOPY Right 1999  . LAPAROSCOPY  2001  . NASAL POLYP SURGERY  2006  . TONSILLECTOMY AND ADENOIDECTOMY  1981   Family History:  Family History  Problem Relation Age of Onset  . Cancer Maternal Grandmother   . Hyperlipidemia Paternal Grandfather   . Miscarriages / Stillbirths Paternal Grandfather   . Heart disease  Paternal Grandfather   . Clotting disorder Mother   . Kidney disease Maternal Grandfather   . Mental illness Neg Hx    Family Psychiatric  History: Please see H&P.  Social History: Please see H&P.  History  Alcohol Use No     History  Drug Use No    Social History   Social History  . Marital status: Married    Spouse name: N/A  . Number of children: N/A  . Years of education: N/A   Social History Main Topics  . Smoking status: Never Smoker  . Smokeless tobacco: Never Used  . Alcohol use No  . Drug use: No  . Sexual activity: Not Asked   Other Topics Concern  . None   Social History Narrative  . None   Additional Social History:                         Sleep: Fair  Appetite:  improving  Current Medications: Current Facility-Administered Medications  Medication Dose Route Frequency Provider Last Rate Last Dose  . acetaminophen (TYLENOL) tablet 650 mg  650 mg Oral Q6H PRN [name], NP   650 mg at 04/23/16 1048  . alum & mag hydroxide-simeth (MAALOX/MYLANTA) 200-200-20 MG/5ML suspension 30 mL  30 mL Oral Q4H PRN [name], NP   30 mL at 04/17/16 1310  . benztropine (COGENTIN) tablet 0.5 mg  0.5 mg Oral QHS Arris Meyn,  MD   0.5 mg at 04/22/16 2025  . feeding supplement (ENSURE ENLIVE) (ENSURE ENLIVE) liquid 237 mL  237 mL Oral BID BM [name], MD   237 mL at 04/16/16 1436  . gabapentin (NEURONTIN) capsule 100 mg  100 mg Oral TID [name], MD   100 mg at 04/23/16 1145  . haloperidol (HALDOL) tablet 7.5 mg  7.5 mg Oral QHS Alfonsa Vaile, MD   7.5 mg at 04/22/16 2024  . hydrOXYzine (ATARAX/VISTARIL) tablet 25 mg  25 mg Oral TID PRN [name], NP   25 mg at 04/22/16 1832  . levothyroxine (SYNTHROID, LEVOTHROID) tablet 75 mcg  75 mcg Oral QAC breakfast [name], NP   75 mcg at 04/23/16 [id number]  . LORazepam (ATIVAN) tablet 0.5 mg  0.5 mg Oral Q6H PRN [name], MD       Or  . LORazepam (ATIVAN) injection 0.5 mg  0.5 mg  Intramuscular Q6H PRN Anjelique Makar, MD      . magnesium hydroxide (MILK OF MAGNESIA) suspension 30 mL  30 mL Oral Daily PRN [name], NP      . multivitamin with minerals tablet 1 tablet  1 tablet Oral Daily [name], NP   1 tablet at 04/23/16 [phone]  . traZODone (DESYREL) tablet 100 mg  100 mg Oral QHS PRN [name], MD   100 mg at 04/20/16 2120  . ziprasidone (GEODON) capsule 20 mg  20 mg Oral TID PRN [name], MD       Or  . ziprasidone (GEODON) injection 10 mg  10 mg Intramuscular TID PRN [name], MD        Lab Results:  No results found for this or any previous visit (from the past 72 hour(s)).  Blood Alcohol level:  Lab Results  Component Value Date   ETH <5 Q000111Q    Metabolic Disorder Labs: Lab Results  Component Value Date   HGBA1C 5.1 04/16/2016   MPG 100 04/16/2016   MPG 103 10/12/2015   Lab Results  Component Value Date   PROLACTIN 23.3 04/16/2016   Lab Results  Component Value Date   CHOL 160 04/16/2016   TRIG 71 04/16/2016   HDL 51 04/16/2016   CHOLHDL 3.1 04/16/2016   VLDL 14 04/16/2016   LDLCALC 95 04/16/2016    Physical Findings: AIMS: Facial and Oral Movements Muscles of Facial Expression: None, normal Lips and Perioral Area: None, normal Jaw: None, normal Tongue: None, normal,Extremity Movements Upper (arms, wrists, hands, fingers): None, normal Lower (legs, knees, ankles, toes): None, normal, Trunk Movements Neck, shoulders, hips: None, normal, Overall Severity Severity of abnormal movements (highest score from questions above): None, normal Incapacitation due to abnormal movements: None, normal Patient's awareness of abnormal movements (rate only patient's report): No Awareness, Dental Status Current problems with teeth and/or dentures?: No Does patient usually wear dentures?: No  CIWA:    COWS:     Musculoskeletal: Strength & Muscle Tone: within normal limits Gait & Station: normal Patient leans:  N/A  Psychiatric Specialty Exam: Physical Exam  Nursing note and vitals reviewed.   Review of Systems  Psychiatric/Behavioral: The patient is nervous/anxious.   All other systems reviewed and are negative.   Blood pressure 97/67, pulse 93, temperature 98.5 F (36.9 C), temperature source Oral, resp. rate 16, height 5\' 5"  (1.651 m), weight 71.3 kg (157 lb 3 oz), last menstrual period 04/02/2016, SpO2 98 %.Body mass index is 26.16 kg/m.  General Appearance:  Casual and Guarded  Eye Contact:  Fair  Speech:  Normal Rate, more spontaneous   Volume:  Decreased  Mood:  Anxious and Dysphoric- improving  Affect:  Depressed  Thought Process: goal directed Descriptions of Associations:linear  Orientation:  Full (Time, Place, and Person)  Thought Content:  Paranoid Ideation and talks about a force that makes her do things  Suicidal Thoughts:  No - pt however denies any thoughts of self harm today   Homicidal Thoughts:  No  Memory:  Immediate;   Fair Recent;   Fair Remote;   Fair  Judgement:  Impaired  Insight:  Shallow  Psychomotor Activity:  Restlessness  Concentration:  Concentration: Fair and Attention Span: Fair  Recall:  [employer] of Knowledge:  Fair  Language:  Fair  Akathisia:  No  Handed:  Right  AIMS (if indicated):   0  Assets:  Social Support  ADL's:  Intact  Cognition:  WNL  Sleep:  Number of Hours: 6.75       Treatment Plan Summary:Patient today seen as anxious , restless at times , continues to worry about her medications . Provided medication education, discussed that her haldol dose was reduced to address her sx. Continue to observe on the unit.    Schizophreniform disorder ([place]) unstable- improving   Will continue today 04/23/16  plan as below except where it is noted. Daily contact with patient to assess and evaluate symptoms and progress in treatment and Medication management   schizophrenia  Will change Haldol to 7.5 mg po qhs for psychosis. Will  continue Cogentin 0.5 mg po qhs for EPS.  For catatonic sx:( RESOLVED)  Will discontinue Ativan due to drowsiness.  For anxiety sx: Discussed adding an SSRI - but patient reports she tried it in the past for postpartum depression and had ADRs, she declines .Will add Gabapentin 100 mg po tid for anxiety/restlessness.  For sleep problems: Trazodone 100 mg po qhs prn .  For anxiety/agitation:  PRN medications as per agitation protocol.  Continue closed observation precaution for safety/fall risk.  Collateral information obtained from family - see H&P.  Reviewed past medical records,treatment plan.   Will continue to monitor vitals ,medication compliance and treatment side effects while patient is here.   Will monitor for medical issues as well as call consult as needed.   Reviewed labs  uds - positive for BZD( likely given in ED), TSH - WNL, LIPID PANEL - wnl , AST - elevated - likely due to OTC medications used , will monitor , pregnancy test - negative , vitamin b12 - high , folate - wnl , RPR - NR., CMP - Bun -borderline high.  I have reviewed EKG for qtc - wnl.  CSW will continue working on disposition.   Patient to participate in therapeutic milieu .    Toye Rouillard, MD 04/23/2016, 12:08 PM

## 2016-04-23 NOTE — Progress Notes (Signed)
Recreation Therapy Notes  Date: 04/23/16 Time: 1000 Location: 500 Hall Dayroom  Group Topic: Wellness  Goal Area(s) Addresses:  Patient will define components of whole wellness. Patient will verbalize benefit of whole wellness.  Intervention: 2 decks of cards  Activity: Deck of Chance.  Patients were given 5 cards from one deck of cards.  LRT would pull a card from the second deck of cards.  Whatever number the LRT pulled, if a patient had a card matching that number, they would have to do an exercise.  Education: Wellness, Dentist.   Education Outcome: Acknowledges education/In group clarification offered/Needs additional education.   Clinical Observations/Feedback: Pt did not attend group.   Victorino Sparrow, LRT/CTRS         Victorino Sparrow A 04/23/2016 11:32 AM

## 2016-04-23 NOTE — Progress Notes (Signed)
D   Pt is pleasant on approach and cooperative    She seems a little more anxious this evening and is having trouble falling asleep   She interacts minimally but is appropriate   She is compliant with treatment  A   Verbal support given   Medications administered and effectiveness monitored    Q 15 min checks R   Pt is safe at present time and receptive to verbal support

## 2016-04-23 NOTE — Progress Notes (Signed)
[facility] 1:1 Observation Documentation  For the first (8) hours following discontinuation of 1:1 precautions, a progress note entry by nursing staff should be documented at least every 2 hours, reflecting the patient's behavior, condition, mood, and conversation.  Use the progress notes for additional entries.  Time 1:1 discontinued:  1730  Patient's Behavior:  Cooperative   In bed asleep  Patient's Condition:   stable  Patient's Conversation:    No conversation she is asleep  Migdalia Dk 04/23/2016, 3:08 AM

## 2016-04-24 DIAGNOSIS — G2571 Drug induced akathisia: Secondary | ICD-10-CM | POA: Clinically undetermined

## 2016-04-24 DIAGNOSIS — T43505A Adverse effect of unspecified antipsychotics and neuroleptics, initial encounter: Secondary | ICD-10-CM

## 2016-04-24 MED ORDER — OLANZAPINE 5 MG PO TBDP
5.0000 mg | ORAL_TABLET | Freq: Every day | ORAL | Status: DC
  Administered 2016-04-24 – 2016-04-27 (×4): 5 mg via ORAL
  Filled 2016-04-24 (×7): qty 1

## 2016-04-24 NOTE — Progress Notes (Signed)
D: Pt presents with flat affect and depressed mood. Pt reports difficulty sleeping last night due to racing thoughts. Pt c/o of muscle spasms and stiffness to the right side of her neck. Pt denies suicidal/homicidal thoughts today. Pt denies depression. Pt reports little anxiety this morning 3/10.  A: Medications reviewed with pt. Medications administered as ordered per MD. Verbal support provided. Pt encouraged to attend groups. 15 minute checks performed for safety. R: Pt receptive to tx. Pt compliant with tx.

## 2016-04-24 NOTE — Progress Notes (Signed)
Pt had a difficult time falling asleep tonight   She got up two different times to get extra medications

## 2016-04-24 NOTE — Plan of Care (Signed)
Problem: Activity: Goal: Interest or engagement in leisure activities will improve Outcome: Progressing Pt  Attends groups regularly. Pt engages with other pts on the unit.

## 2016-04-24 NOTE — Progress Notes (Signed)
Recreation Therapy Notes  Date: 04/24/16 Time: 1000 Location: 500 Hall Dayroom  Group Topic: Communication, Team Building, Problem Solving  Goal Area(s) Addresses:  Patient will effectively work with peer towards shared goal.  Patient will identify skills used to make activity successful.  Patient will identify how skills used during activity can be used to reach post d/c goals.   Behavioral Response: Engaged  Intervention: STEM Activity  Activity: Geophysicist/field seismologist. In teams patients were given 12 plastic drinking straws and a length of masking tape. Using the materials provided patients were asked to build a landing pad to catch a golf ball dropped from approximately 6 feet in the air.   Education: Education officer, community, Discharge Planning   Education Outcome: Acknowledges education/In group clarification offered/Needs additional education.   Clinical Observations/Feedback:  Pt stated the group used teamwork to complete the activity.  Pt expressed the skills from this activity taught her "to get everyone's input" when dealing with things.  Pt also stated "you can be more successful with others opinions."   Khamani Daniely Ria Comment, LRT/CTRS     Victorino Sparrow A 04/24/2016 11:59 AM

## 2016-04-24 NOTE — Progress Notes (Signed)
[facility] MD Progress Note  04/24/2016 2:15 PM Charlotte Wise  MRN:  [id number]     Subjective:  Patient states "I feel restless.'    Objective: Charlotte Wise is seen as alert, oriented x3. Pt reports she continues to improve with regards to her delusions and mood lability and sleep issues , however she feels she has ADRs to haldol. She has this constant urge to move and feels restless often. Pt reported to RN that she has spasms of her neck too , on and off , which could have been contributed by Haldol. Reduced Haldol dosage as well as added cogentin - but her sx persist. Discussed changing haldol to an atypical antipsychotic - she agrees.     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 Acute neuroleptic-induced akathisia [G25.71, [id number] 04/24/2016  . Schizophreniform disorder ([place]) [F20.81] 04/23/2016  . Allergic rhinitis [J30.9] 05/05/2007  . HYPERSOMNIA, IDIOPATHIC [G47.10] 01/13/2007   Total Time spent with patient: 25 minutes  Past Psychiatric History: Please see H&P.   Past Medical History:  Past Medical History:  Diagnosis Date  . Allergy   . Anxiety   . Arthritis   . 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 Thyroid disease     Past Surgical History:  Procedure Laterality Date  . ANKLE ARTHROSCOPY Right 2000  . KNEE ARTHROSCOPY Left 1988  . KNEE ARTHROSCOPY Right 1999  . LAPAROSCOPY  2001  . NASAL POLYP SURGERY  2006  . TONSILLECTOMY AND ADENOIDECTOMY  1981   Family History:  Family History  Problem Relation Age of Onset  . Cancer Maternal Grandmother   . Hyperlipidemia Paternal Grandfather   . Miscarriages / Stillbirths Paternal Grandfather   . Heart disease Paternal Grandfather   . Clotting disorder Mother   . Kidney disease Maternal Grandfather   . Mental illness Neg Hx    Family Psychiatric  History: Please see H&P.  Social  History: Please see H&P.  History  Alcohol Use No     History  Drug Use No    Social History   Social History  . Marital status: Married    Spouse name: N/A  . Number of children: N/A  . Years of education: N/A   Social History Main Topics  . Smoking status: Never Smoker  . Smokeless tobacco: Never Used  . Alcohol use No  . Drug use: No  . Sexual activity: Not Asked   Other Topics Concern  . None   Social History Narrative  . None   Additional Social History:                         Sleep: Fair  Appetite:  Fair  Current Medications: Current Facility-Administered Medications  Medication Dose Route Frequency Provider Last Rate Last Dose  . acetaminophen (TYLENOL) tablet 650 mg  650 mg Oral Q6H PRN [name], NP   650 mg at 04/24/16 [id number]  . alum & mag hydroxide-simeth (MAALOX/MYLANTA) 200-200-20 MG/5ML suspension 30 mL  30 mL Oral Q4H PRN [name], NP   30 mL at 04/17/16 1310  . benztropine (COGENTIN) tablet 1 mg  1 mg Oral QHS [name], MD   1 mg at 04/23/16 2037  . feeding supplement (ENSURE ENLIVE) (ENSURE ENLIVE) liquid 237 mL  237 mL Oral BID BM [name], MD   237 mL at  04/16/16 1436  . gabapentin (NEURONTIN) capsule 100 mg  100 mg Oral TID [name], MD   100 mg at 04/24/16 1116  . hydrOXYzine (ATARAX/VISTARIL) tablet 25 mg  25 mg Oral TID PRN [name], NP   25 mg at 04/23/16 2301  . levothyroxine (SYNTHROID, LEVOTHROID) tablet 75 mcg  75 mcg Oral QAC breakfast [name], NP   75 mcg at 04/24/16 [phone]  . LORazepam (ATIVAN) tablet 0.5 mg  0.5 mg Oral Q6H PRN [name], MD   0.5 mg at 04/23/16 2139   Or  . LORazepam (ATIVAN) injection 0.5 mg  0.5 mg Intramuscular Q6H PRN Tymier Lindholm, MD      . magnesium hydroxide (MILK OF MAGNESIA) suspension 30 mL  30 mL Oral Daily PRN [name], NP      . multivitamin with minerals tablet 1 tablet  1 tablet Oral Daily [name], NP   1 tablet at 04/24/16 0820  .  OLANZapine zydis (ZYPREXA) disintegrating tablet 5 mg  5 mg Oral QHS Wyvonne Carda, MD      . traZODone (DESYREL) tablet 100 mg  100 mg Oral QHS PRN [name], MD   100 mg at 04/23/16 2037  . ziprasidone (GEODON) capsule 20 mg  20 mg Oral TID PRN [name], MD   20 mg at 04/23/16 2301   Or  . ziprasidone (GEODON) injection 10 mg  10 mg Intramuscular TID PRN [name], MD        Lab Results:  No results found for this or any previous visit (from the past 48 hour(s)).  Blood Alcohol level:  Lab Results  Component Value Date   ETH <5 Q000111Q    Metabolic Disorder Labs: Lab Results  Component Value Date   HGBA1C 5.1 04/16/2016   MPG 100 04/16/2016   MPG 103 10/12/2015   Lab Results  Component Value Date   PROLACTIN 23.3 04/16/2016   Lab Results  Component Value Date   CHOL 160 04/16/2016   TRIG 71 04/16/2016   HDL 51 04/16/2016   CHOLHDL 3.1 04/16/2016   VLDL 14 04/16/2016   LDLCALC 95 04/16/2016    Physical Findings: AIMS: Facial and Oral Movements Muscles of Facial Expression: None, normal Lips and Perioral Area: None, normal Jaw: None, normal Tongue: None, normal,Extremity Movements Upper (arms, wrists, hands, fingers): None, normal Lower (legs, knees, ankles, toes): None, normal, Trunk Movements Neck, shoulders, hips: None, normal, Overall Severity Severity of abnormal movements (highest score from questions above): None, normal Incapacitation due to abnormal movements: None, normal Patient's awareness of abnormal movements (rate only patient's report): No Awareness, Dental Status Current problems with teeth and/or dentures?: No Does patient usually wear dentures?: No  CIWA:    COWS:     Musculoskeletal: Strength & Muscle Tone: within normal limits Gait & Station: normal Patient leans: N/A  Psychiatric Specialty Exam: Physical Exam  Nursing note and vitals reviewed.   Review of Systems  Psychiatric/Behavioral: The patient is  nervous/anxious.   All other systems reviewed and are negative.   Blood pressure 100/68, pulse 85, temperature 98.7 F (37.1 C), resp. rate 18, height 5\' 5"  (1.651 m), weight 71.3 kg (157 lb 3 oz), last menstrual period 04/02/2016, SpO2 98 %.Body mass index is 26.16 kg/m.  General Appearance: Casual and Guarded  Eye Contact:  Fair  Speech:  Normal Rate, more spontaneous   Volume:  Normal  Mood:  Anxious- improving  Affect:  Congruent  Thought Process:  goal directed Descriptions of Associations:linear  Orientation:  Full (Time, Place, and Person)  Thought Content:  Rumination  Suicidal Thoughts:  No - pt however denies any thoughts of self harm today   Homicidal Thoughts:  No  Memory:  Immediate;   Fair Recent;   Fair Remote;   Fair  Judgement:  Fair  Insight:  Fair  Psychomotor Activity:  Restlessness  Concentration:  Concentration: Fair and Attention Span: Poor  Recall:  [employer] of Knowledge:  Fair  Language:  Fair  Akathisia:  Yes  Handed:  Right  AIMS (if indicated):     Assets:  Social Support  ADL's:  Intact  Cognition:  WNL  Sleep:  Number of Hours: 6       Treatment Plan Summary:Patient today seen as restless , although her delusions, sleep are improving , she continues to atruggle with ?akathisia . Will readjust medications and continue treatment.    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 Daily contact with patient to assess and evaluate symptoms and progress in treatment and Medication management   schizophreniforn do: Will discontinue Haldol due to ADRs. Start Zyprexa zydis 5 mg po qhs for psychosis.  Will continue Cogentin 1 mg po qhs for EPS.  For Akathisia: Will continue Cogentin as scheduled. Will DC Haldol.  For catatonic sx:( RESOLVED)  Will discontinue Ativan due to drowsiness.  For anxiety sx: Discussed adding an SSRI - but patient reports she tried it  in the past for postpartum depression and had ADRs, she declines Will continue Gabapentin 100 mg po tid for anxiety/restlessness.  For sleep problems: Trazodone 100 mg po qhs prn .  For anxiety/agitation:  PRN medications as per agitation protocol.  Continue closed observation precaution for safety/fall risk.  Collateral information obtained from family - see H&P.  Reviewed past medical records,treatment plan.   Will continue to monitor vitals ,medication compliance and treatment side effects while patient is here.   Will monitor for medical issues as well as call consult as needed.   Reviewed labs  uds - positive for BZD( likely given in ED), TSH - WNL, LIPID PANEL - wnl , AST - elevated - likely due to OTC medications used , will monitor , pregnancy test - negative , vitamin b12 - high , folate - wnl , RPR - NR., CMP - Bun -borderline high.  I have reviewed EKG for qtc - wnl.  CSW will continue working on disposition.   Patient to participate in therapeutic milieu .    Ximenna Fonseca, MD 04/24/2016, 2:15 PM

## 2016-04-24 NOTE — Progress Notes (Signed)
Adult Psychoeducational Group Note  Date:  04/24/2016 Time:  9:29 PM  Group Topic/Focus:  Wrap-Up Group:   The focus of this group is to help patients review their daily goal of treatment and discuss progress on daily workbooks.  Participation Level:  Active  Participation Quality:  Appropriate  Affect:  Appropriate  Cognitive:  Alert  Insight: Appropriate  Engagement in Group:  Engaged  Modes of Intervention:  Discussion  Additional Comments:  Patient states, "I had a really good day". Patient's goal for today was to talk to the doctor about changing her medication.   Ranjit Ashurst L Guliana Weyandt 04/24/2016, 9:29 PM

## 2016-04-24 NOTE — [facility] Group Notes (Signed)
[place] Group Notes:  (Counselor/Nursing/MHT/Case Management/Adjunct)  04/24/2016 1:15PM  Type of Therapy:  Group Therapy  Participation Level:  Active  Participation Quality:  Appropriate  Affect:  Flat  Cognitive:  Oriented  Insight:  Improving  Engagement in Group:  Limited  Engagement in Therapy:  Limited  Modes of Intervention:  Discussion, Exploration and Socialization  Summary of Progress/Problems: The topic for group was balance in life.  Pt participated in the discussion about when their life was in balance and out of balance and how this feels.  Pt discussed ways to get back in balance and short term goals they can work on to get where they want to be. Stayed the entire time, engaged throughout.  Initially stated she is balanced, and knows this because she is so much better than when she came in.  Later, talked about how she still has racing thoughts and some anxiety/akethesia, and how it makes her feel unbalanced.  Feels hopeful that Dr is changing meds, but also disappointed, yet resigned to staying through the weekend.   [name] 04/24/2016 2:57 PM

## 2016-04-25 NOTE — Progress Notes (Signed)
Patient ID: NOTNAMED PAYNE, female   DOB: 04/30/1975, 41 y.o.   MRN: [id number]   D: Patient has a flat affect on approach tonight but brightens some during conversation. Patient reports having a good day. No bizarre behaviors noted. Denies any thought to harm self or others tonight. Only complaint is some rt sided neck pain that she feels is from the way she slept last night.  A: Staff will monitor on q 15 minute checks, follow treatment plan, and give medications as ordered R: Cooperative with taking medications and cooperative with unit rules.

## 2016-04-25 NOTE — [facility] Group Notes (Signed)
Type of Therapy:  Group therapy  Participation Level:  Active  Participation Quality:  Attentive  Affect:  Flat  Cognitive:  Oriented  Insight:  Limited  Engagement in Therapy:  Limited  Modes of Intervention:  Discussion, Socialization  Summary of Progress/Problems:  Charlotte Wise was engaged throughout, and stayed entire time. When asked what courage meant for her, Charlotte Wise stated that courage was the strength to be who you are. She also mentioned that if you are able to be who you truly are, it will make life easier. Charlotte Wise stated that yoga has helped her to regain peace and it has allowed for her mind and body to be in tune with one another. Charlotte Wise stated that she needs courage today to be who she really is, because at times she finds it difficult and that leaves her very vulnerable.   Chaplain was here to lead a group on themes of hope and courage.

## 2016-04-25 NOTE — Progress Notes (Signed)
[facility] MD Progress Note  04/25/2016 11:53 AM Charlotte Wise  MRN:  [id number]     Subjective:  Patient states "I feel OK, I do feel like my legs are restless at times , but I do not think it bothers me too much.'     Objective: HATSUYE Wise is seen as alert , oriented x3. Pt seen as calm , her anxiety seems to be improving. 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 Pt has been making an attempt to participate in milieu.Pt is tolerating her zyprexa well. Continue to encourage and support.      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 Acute neuroleptic-induced akathisia [G25.71, [id number] 04/24/2016  . Schizophreniform disorder ([place]) [F20.81] 04/23/2016  . Allergic rhinitis [J30.9] 05/05/2007  . HYPERSOMNIA, IDIOPATHIC [G47.10] 01/13/2007   Total Time spent with patient: 20 minutes  Past Psychiatric History: Please see H&P.   Past Medical History:  Past Medical History:  Diagnosis Date  . Allergy   . Anxiety   . Arthritis   . 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 Thyroid disease     Past Surgical History:  Procedure Laterality Date  . ANKLE ARTHROSCOPY Right 2000  . KNEE ARTHROSCOPY Left 1988  . KNEE ARTHROSCOPY Right 1999  . LAPAROSCOPY  2001  . NASAL POLYP SURGERY  2006  . TONSILLECTOMY AND ADENOIDECTOMY  1981   Family History:  Family History  Problem Relation Age of Onset  . Cancer Maternal Grandmother   . Hyperlipidemia Paternal Grandfather   . Miscarriages / Stillbirths Paternal Grandfather   . Heart disease Paternal Grandfather   . Clotting disorder Mother   . Kidney disease Maternal Grandfather   . Mental illness Neg Hx    Family Psychiatric  History: Please see H&P.  Social History: Please see H&P.  History  Alcohol Use No      History  Drug Use No    Social History   Social History  . Marital status: Married    Spouse name: N/A  . Number of children: N/A  . Years of education: N/A   Social History Main Topics  . Smoking status: Never Smoker  . Smokeless tobacco: Never Used  . Alcohol use No  . Drug use: No  . Sexual activity: Not Asked   Other Topics Concern  . None   Social History Narrative  . None   Additional Social History:                         Sleep: Fair  Appetite:  Fair  Current Medications: Current Facility-Administered Medications  Medication Dose Route Frequency Provider Last Rate Last Dose  . acetaminophen (TYLENOL) tablet 650 mg  650 mg Oral Q6H PRN [name], NP   650 mg at 04/25/16 0915  . alum & mag hydroxide-simeth (MAALOX/MYLANTA) 200-200-20 MG/5ML suspension 30 mL  30 mL Oral Q4H PRN [name], NP   30 mL at 04/17/16 1310  . benztropine (COGENTIN) tablet 1 mg  1 mg Oral QHS [name], MD   1 mg at 04/24/16 2106  . feeding supplement (ENSURE ENLIVE) (ENSURE ENLIVE) liquid 237 mL  237 mL Oral BID BM [name], MD   237 mL at 04/16/16 1436  .  gabapentin (NEURONTIN) capsule 100 mg  100 mg Oral TID [name], MD   100 mg at 04/25/16 0913  . hydrOXYzine (ATARAX/VISTARIL) tablet 25 mg  25 mg Oral TID PRN [name], NP   25 mg at 04/23/16 2301  . levothyroxine (SYNTHROID, LEVOTHROID) tablet 75 mcg  75 mcg Oral QAC breakfast [name], NP   75 mcg at 04/25/16 [id number]  . LORazepam (ATIVAN) tablet 0.5 mg  0.5 mg Oral Q6H PRN [name], MD   0.5 mg at 04/23/16 2139   Or  . LORazepam (ATIVAN) injection 0.5 mg  0.5 mg Intramuscular Q6H PRN Caylan Chenard, MD      . magnesium hydroxide (MILK OF MAGNESIA) suspension 30 mL  30 mL Oral Daily PRN [name], NP      . multivitamin with minerals tablet 1 tablet  1 tablet Oral Daily [name], NP   1 tablet at 04/25/16 0913  . OLANZapine zydis (ZYPREXA) disintegrating tablet 5 mg  5  mg Oral QHS [name], MD   5 mg at 04/24/16 2106  . traZODone (DESYREL) tablet 100 mg  100 mg Oral QHS PRN [name], MD   100 mg at 04/24/16 2106  . ziprasidone (GEODON) capsule 20 mg  20 mg Oral TID PRN [name], MD   20 mg at 04/23/16 2301   Or  . ziprasidone (GEODON) injection 10 mg  10 mg Intramuscular TID PRN [name], MD        Lab Results:  No results found for this or any previous visit (from the past 48 hour(s)).  Blood Alcohol level:  Lab Results  Component Value Date   ETH <5 Q000111Q    Metabolic Disorder Labs: Lab Results  Component Value Date   HGBA1C 5.1 04/16/2016   MPG 100 04/16/2016   MPG 103 10/12/2015   Lab Results  Component Value Date   PROLACTIN 23.3 04/16/2016   Lab Results  Component Value Date   CHOL 160 04/16/2016   TRIG 71 04/16/2016   HDL 51 04/16/2016   CHOLHDL 3.1 04/16/2016   VLDL 14 04/16/2016   LDLCALC 95 04/16/2016    Physical Findings: AIMS: Facial and Oral Movements Muscles of Facial Expression: None, normal Lips and Perioral Area: None, normal Jaw: None, normal Tongue: None, normal,Extremity Movements Upper (arms, wrists, hands, fingers): None, normal Lower (legs, knees, ankles, toes): Minimal, Trunk Movements Neck, shoulders, hips: None, normal, Overall Severity Severity of abnormal movements (highest score from questions above): None, normal Incapacitation due to abnormal movements: None, normal Patient's awareness of abnormal movements (rate only patient's report): Aware, no distress, Dental Status Current problems with teeth and/or dentures?: No Does patient usually wear dentures?: No  CIWA:    COWS:     Musculoskeletal: Strength & Muscle Tone: within normal limits Gait & Station: normal Patient leans: N/A  Psychiatric Specialty Exam: Physical Exam  Nursing note and vitals reviewed.   Review of Systems  Psychiatric/Behavioral: The patient is nervous/anxious.   All other systems  reviewed and are negative.   Blood pressure 102/61, pulse 60, temperature 98 F (36.7 C), resp. rate 17, height 5\' 5"  (1.651 m), weight 71.3 kg (157 lb 3 oz), last menstrual period 04/02/2016, SpO2 98 %.Body mass index is 26.16 kg/m.  General Appearance: Casual and Guarded  Eye Contact:  Fair  Speech:  Normal Rate, more spontaneous   Volume:  Normal  Mood:  Anxious- improving  Affect:  Congruent  Thought Process: goal  directed Descriptions of Associations:linear  Orientation:  Full (Time, Place, and Person)  Thought Content:  Rumination  Suicidal Thoughts:  No - pt however denies any thoughts of self harm today   Homicidal Thoughts:  No  Memory:  Immediate;   Fair Recent;   Fair Remote;   Fair  Judgement:  Fair  Insight:  Fair  Psychomotor Activity:  Restlessness  Concentration:  Concentration: Fair and Attention Span: Fair  Recall:  [employer] of Knowledge:  Fair  Language:  Fair  Akathisia:  Yes improved  Handed:  Right  AIMS (if indicated):   2  Assets:  Social Support  ADL's:  Intact  Cognition:  WNL  Sleep:  Number of Hours: 6.75       Treatment Plan Summary:Patient today seen as alert, oriented , anxiety is improving, tolerating zyprexa well, continue to treat.      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 Daily contact with patient to assess and evaluate symptoms and progress in treatment and Medication management   schizophreniforn do: Discontinued Haldol due to ADRs. Continue Zyprexa zydis 5 mg po qhs for psychosis. AIMS - 2 ( 04/25/16) - Improving. Will continue Cogentin 1 mg po qhs for EPS.  For Akathisia: Will continue Cogentin as scheduled. Haldol discontinued .  For catatonic sx:( RESOLVED)  Discontinued Ativan due to drowsiness.  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  For sleep problems: Trazodone 100 mg po qhs prn .  For anxiety/agitation:  PRN medications as per agitation protocol.  Continue closed observation precaution for safety/fall risk.  Collateral information obtained from family - see H&P.  Reviewed past medical records,treatment plan.   Will continue to monitor vitals ,medication compliance and treatment side effects while patient is here.   Will monitor for medical issues as well as call consult as needed.   Reviewed labs  uds - positive for BZD( likely given in ED), TSH - WNL, LIPID PANEL - wnl , AST - elevated - likely due to OTC medications used , will monitor , pregnancy test - negative , vitamin b12 - high , folate - wnl , RPR - NR., CMP - Bun -borderline high.  I have reviewed EKG for qtc - wnl.  CSW will continue working on disposition.   Patient to participate in therapeutic milieu .    Avrianna Smart, MD 04/25/2016, 11:53 AM

## 2016-04-25 NOTE — Progress Notes (Signed)
D: [place] complains of neck pain of 4/10. Denies pain, SI/HI, AH/VH at this time. Pleasant and appropriate on unit. No behavioral issues noted.  A: Staff encouraged patient to continue with the treatment and verbalize needs to staff. Support offered as needed. Due meds given as ordered. Routine safety checks maintained. Will continue to monitor patient. R: Patient remains safe.

## 2016-04-25 NOTE — Progress Notes (Signed)
Recreation Therapy Notes  Date: 04/25/16 Time: 1000 Location: 500 Hall Dayroom  Group Topic: Stress Management  Goal Area(s) Addresses:  Patient will verbalize importance of using healthy stress management.  Patient will identify positive emotions associated with healthy stress management.   Behavioral Response: Engaged  Intervention: Stress Management  Activity :  Progressive Muscle Relaxation, Guided Imagery.  LRT introduced the stress management techniques of progressive muscle relaxation and guided imagery.  LRT read scripts to guide patients through the techniques so they could fully engage.  Patients were to follow along as LRT read scripts.  Education:  Stress Management, Discharge Planning.   Education Outcome: Acknowledges edcuation/In group clarification offered/Needs additional education  Clinical Observations/Feedback: Pt arrived late but was fully engaged in both techniques.  Pt was attentive to LRT as scripts were read.  Pt stated she felt more relaxed at completion of group.   Victorino Sparrow, LRT/CTRS         Victorino Sparrow A 04/25/2016 12:26 PM

## 2016-04-26 DIAGNOSIS — F2081 Schizophreniform disorder: Secondary | ICD-10-CM

## 2016-04-26 DIAGNOSIS — Z8349 Family history of other endocrine, nutritional and metabolic diseases: Secondary | ICD-10-CM

## 2016-04-26 DIAGNOSIS — Z808 Family history of malignant neoplasm of other organs or systems: Secondary | ICD-10-CM

## 2016-04-26 NOTE — Progress Notes (Signed)
Writer spoke with patient 1:1 and she reports that she has been started on some new medications and wants to feel better. She reports that she is starting to feel better each day. She was informed of her scheduled medications and received her pillow from home for her neck. Support given and safety maintained on unit with 15 min checks.

## 2016-04-26 NOTE — Progress Notes (Signed)
[facility] MD Progress Note  04/26/2016 4:12 PM BRITTLYNN CHANDA  MRN:  [id number]   Subjective:  Cornesha reports, "I feel like my body especially my legs want to move. But, the doctor has stopped the medicine that she thought was doing that to me. I'm willing to wait till that medicine is out of my sister".  Objective: Asalia is seen, chart reviewed. She is alert & oriented x3. She is visible on the unit. She is attending & participating in the group milieu. She says today that she is still feeling like her legs want to move at all times. She states that the doctor stopped the medication that she thought was contributing to the her legs wanting to move. She says she is willing to wait till all the medications are out of her sister. Evelyna presents as calm She says her anxiety seems to be improving. Pt reports sleep as better last night. She is tolerating her medications without any adverse effects. Continue to encourage and support.  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 Acute neuroleptic-induced akathisia [G25.71, [id number] 04/24/2016  . Schizophreniform disorder ([place]) [F20.81] 04/23/2016  . Allergic rhinitis [J30.9] 05/05/2007  . HYPERSOMNIA, IDIOPATHIC [G47.10] 01/13/2007   Total Time spent with patient: 15 minutes  Past Psychiatric History: Please see H&P.  Past Medical History:  Past Medical History:  Diagnosis Date  . Allergy   . Anxiety   . Arthritis   . 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 Thyroid disease     Past Surgical History:  Procedure Laterality Date  . ANKLE ARTHROSCOPY Right 2000  . KNEE ARTHROSCOPY Left 1988  . KNEE ARTHROSCOPY Right 1999  . LAPAROSCOPY  2001  . NASAL POLYP SURGERY  2006  . TONSILLECTOMY AND ADENOIDECTOMY  1981   Family History:  Family History  Problem Relation Age of Onset  . Cancer Maternal Grandmother   .  Hyperlipidemia Paternal Grandfather   . Miscarriages / Stillbirths Paternal Grandfather   . Heart disease Paternal Grandfather   . Clotting disorder Mother   . Kidney disease Maternal Grandfather   . Mental illness Neg Hx    Family Psychiatric  History: Please see H&P.  Social History: Please see H&P.  History  Alcohol Use No     History  Drug Use No    Social History   Social History  . Marital status: Married    Spouse name: N/A  . Number of children: N/A  . Years of education: N/A   Social History Main Topics  . Smoking status: Never Smoker  . Smokeless tobacco: Never Used  . Alcohol use No  . Drug use: No  . Sexual activity: Not Asked   Other Topics Concern  . None   Social History Narrative  . None   Additional Social History:   Sleep: Fair  Appetite:  Fair  Current Medications: Current Facility-Administered Medications  Medication Dose Route Frequency Provider Last Rate Last Dose  . acetaminophen (TYLENOL) tablet 650 mg  650 mg Oral Q6H PRN [name], NP   650 mg at 04/26/16 0919  . alum & mag hydroxide-simeth (MAALOX/MYLANTA) 200-200-20 MG/5ML suspension 30 mL  30 mL Oral Q4H PRN [name], NP   30 mL at 04/17/16 1310  . benztropine (COGENTIN) tablet 1 mg  1 mg Oral QHS [name], MD   1 mg at 04/25/16 2102  .  feeding supplement (ENSURE ENLIVE) (ENSURE ENLIVE) liquid 237 mL  237 mL Oral BID BM [name], MD   237 mL at 04/16/16 1436  . gabapentin (NEURONTIN) capsule 100 mg  100 mg Oral TID [name], MD   100 mg at 04/26/16 1521  . hydrOXYzine (ATARAX/VISTARIL) tablet 25 mg  25 mg Oral TID PRN [name], NP   25 mg at 04/23/16 2301  . levothyroxine (SYNTHROID, LEVOTHROID) tablet 75 mcg  75 mcg Oral QAC breakfast [name], NP   75 mcg at 04/26/16 [phone]  . LORazepam (ATIVAN) tablet 0.5 mg  0.5 mg Oral Q6H PRN [name], MD   0.5 mg at 04/23/16 2139   Or  . LORazepam (ATIVAN) injection 0.5 mg  0.5 mg Intramuscular  Q6H PRN [name], MD      . magnesium hydroxide (MILK OF MAGNESIA) suspension 30 mL  30 mL Oral Daily PRN [name], NP      . multivitamin with minerals tablet 1 tablet  1 tablet Oral Daily [name], NP   1 tablet at 04/26/16 0919  . OLANZapine zydis (ZYPREXA) disintegrating tablet 5 mg  5 mg Oral QHS [name], MD   5 mg at 04/25/16 2102  . traZODone (DESYREL) tablet 100 mg  100 mg Oral QHS PRN [name], MD   100 mg at 04/25/16 2102  . ziprasidone (GEODON) capsule 20 mg  20 mg Oral TID PRN [name], MD   20 mg at 04/23/16 2301   Or  . ziprasidone (GEODON) injection 10 mg  10 mg Intramuscular TID PRN [name], MD       Lab Results:  No results found for this or any previous visit (from the past 48 hour(s)).  Blood Alcohol level:  Lab Results  Component Value Date   ETH <5 Q000111Q   Metabolic Disorder Labs: Lab Results  Component Value Date   HGBA1C 5.1 04/16/2016   MPG 100 04/16/2016   MPG 103 10/12/2015   Lab Results  Component Value Date   PROLACTIN 23.3 04/16/2016   Lab Results  Component Value Date   CHOL 160 04/16/2016   TRIG 71 04/16/2016   HDL 51 04/16/2016   CHOLHDL 3.1 04/16/2016   VLDL 14 04/16/2016   LDLCALC 95 04/16/2016    Physical Findings: AIMS: Facial and Oral Movements Muscles of Facial Expression: None, normal Lips and Perioral Area: None, normal Jaw: None, normal Tongue: None, normal,Extremity Movements Upper (arms, wrists, hands, fingers): None, normal Lower (legs, knees, ankles, toes): Minimal, Trunk Movements Neck, shoulders, hips: None, normal, Overall Severity Severity of abnormal movements (highest score from questions above): None, normal Incapacitation due to abnormal movements: None, normal Patient's awareness of abnormal movements (rate only patient's report): Aware, no distress, Dental Status Current problems with teeth and/or dentures?: No Does patient usually wear dentures?: No  CIWA:     COWS:     Musculoskeletal: Strength & Muscle Tone: within normal limits Gait & Station: normal Patient leans: N/A  Psychiatric Specialty Exam: Physical Exam  Nursing note and vitals reviewed.   Review of Systems  Psychiatric/Behavioral: The patient is nervous/anxious.   All other systems reviewed and are negative.   Blood pressure 102/61, pulse 60, temperature 98 F (36.7 C), resp. rate 17, height 5\' 5"  (1.651 m), weight 71.3 kg (157 lb 3 oz), last menstrual period 04/02/2016, SpO2 98 %.Body mass index is 26.16 kg/m.  General Appearance: Casual and Guarded  Eye Contact:  Fair  Speech:  Normal Rate, more spontaneous   Volume:  Normal  Mood:  Anxious- improving  Affect:  Congruent  Thought Process: goal directed Descriptions of Associations:linear  Orientation:  Full (Time, Place, and Person)  Thought Content:  Rumination  Suicidal Thoughts:  No - pt however denies any thoughts of self harm today   Homicidal Thoughts:  No  Memory:  Immediate;   Fair Recent;   Fair Remote;   Fair  Judgement:  Fair  Insight:  Fair  Psychomotor Activity:  Restlessness  Concentration:  Concentration: Fair and Attention Span: Fair  Recall:  [employer] of Knowledge:  Fair  Language:  Fair  Akathisia:  Yes improved  Handed:  Right  AIMS (if indicated):   2  Assets:  Social Support  ADL's:  Intact  Cognition:  WNL  Sleep:  Number of Hours: 6.75    Treatment Plan Summary: Patient today seen as alert, oriented, anxiety is improving, tolerating zyprexa well, continue to treat.  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 Daily contact with patient to assess and evaluate symptoms and progress in treatment and Medication management   Schizophreniforn do: Discontinued Haldol due to ADRs. Will continue Zyprexa zydis 5 mg po qhs for psychosis. AIMS - 2 (04/25/16) - Improving. Will continue Cogentin 1 mg po qhs for  EPS.  For Akathisia: Will continue Cogentin as scheduled. Haldol discontinued .  For catatonic sx:( RESOLVED)  Discontinued Ativan due to drowsiness.  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 Will continue Gabapentin 100 mg po tid for anxiety/restlessness.  For sleep problems: Trazodone 100 mg po qhs prn .  For anxiety/agitation: PRN medications as per agitation protocol.  Continue closed observation precaution for safety/fall risk.  Collateral information obtained from family - see H&P.  Reviewed past medical records,treatment plan.   Will continue to monitor vitals ,medication compliance and treatment side effects while patient is here.   Will monitor for medical issues as well as call consult as needed.   Reviewed labs  uds - positive for BZD( likely given in ED), TSH - WNL, LIPID PANEL - wnl , AST - elevated - likely due to OTC medications used, will monitor, pregnancy test - negative , vitamin b12 - high , folate - wnl , RPR - NR., CMP - Bun -borderline high.  I have reviewed EKG for qtc - wnl.  CSW will continue working on disposition.   Patient to participate in therapeutic milieu. Reviewed current treatment plan, no changes made. Continue as recommended.   Encarnacion Slates, NP, P 04/26/2016, 4:12 PMPatient ID: Margit Hanks, female   DOB: 26-May-1975, 41 y.o.   MRN: [id number]

## 2016-04-26 NOTE — [facility] Group Notes (Signed)
[facility] Group Notes:  (Clinical Social Work)  04/26/2016  11:15-12:00PM  Summary of Progress/Problems:   Today's process group involved patients discussing their feelings related to being hospitalized, both positive and negative.  These were itemized on the whiteboard, and then the group brainstormed specific ways in which they could seek to either promote the positive feelings or cope with the negative ones when they occur after discharge. The patient expressed that her primary emotion about hospitalization involves being grateful, and she stated that she needed help and is glad she is receiving it.  She demonstrates insight into how to remain well at discharge.  Type of Therapy:  Group Therapy - Process  Participation Level:  Active  Participation Quality:  Attentive, Sharing and Supportive  Affect:  Anxious  Cognitive:  Appropriate and Oriented  Insight:  Engaged  Engagement in Therapy:  Engaged  Modes of Intervention:  Exploration, Discussion  Selmer Dominion, LCSW 04/26/2016, 12:54 PM

## 2016-04-26 NOTE — Progress Notes (Signed)
Patient denies SIm HI and AVH.  Patient had no complaints of anxiety or depression.  Patient has been noted to be pacing the halls, attending groups, patient compliant with medications.   Assess patient for safety, offer medications as prescribed, engage patient in 1:1 staff talks.   Patient able to contract for safety, continue to monitor as planned.

## 2016-04-26 NOTE — Progress Notes (Signed)
[facility] Group Notes:  (Nursing/MHT/Case Management/Adjunct)  Date:  04/26/2016  Time:  9:11 PM  Type of Therapy:  Psychoeducational Skills  Participation Level:  Active  Participation Quality:  Appropriate  Affect:  Appropriate  Cognitive:  Appropriate  Insight:  Appropriate  Engagement in Group:  Engaged  Modes of Intervention:  Education  Summary of Progress/Problems: The patient expressed in group that she enjoyed the daytime group since they discussed feelings. She also expressed that she has been working on her journal ing and meditation. In terms of the theme for the day, her coping skill will be to meditate and journal.   Archie Balboa S 04/26/2016, 9:11 PM

## 2016-04-27 NOTE — Progress Notes (Signed)
Patient has been up and active on the unit, she was observed watching the super bowl game with no interactions with other peers. Writer spoke with her 1:1 and she is hopeful to discharge on tomorrow.. Patient currently denies having si/hi/a/v hall. Support and encouragement offered, safety maintained on unit, will continue to monitor.

## 2016-04-27 NOTE — Progress Notes (Signed)
[facility] MD Progress Note  04/27/2016 2:32 PM Charlotte Wise  MRN:  [id number]   Subjective:  Charlotte Wise reports, "I'm doing well. Feeling like I'm getting stronger & stronger mentally. I just had a low blood pressure reading in the morning today, I got up from bed too fast. I felt light headed then. I'm alright now".  Objective: Charlotte Wise is seen, chart reviewed. She is alert & oriented x3. She is visible on the unit. She is attending & participating in the group milieu. She says today that she is stronger mentally. Says her legs still move, but not all the time now. She reports a low blood pressure reading this morning. She says it was because she got out of the bed too fast. Charlotte Wise presents as calm. She says her anxiety seems to be improving. Pt reports sleep as better last night. She is tolerating her medications without any adverse effects. Will manually recheck her blood pressure. Continue to encourage and support.  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 Acute neuroleptic-induced akathisia [G25.71, [id number] 04/24/2016  . Schizophreniform disorder ([place]) [F20.81] 04/23/2016  . Allergic rhinitis [J30.9] 05/05/2007  . HYPERSOMNIA, IDIOPATHIC [G47.10] 01/13/2007   Total Time spent with patient: 15 minutes  Past Psychiatric History: Please see H&P.  Past Medical History:  Past Medical History:  Diagnosis Date  . Allergy   . Anxiety   . Arthritis   . 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 Thyroid disease     Past Surgical History:  Procedure Laterality Date  . ANKLE ARTHROSCOPY Right 2000  . KNEE ARTHROSCOPY Left 1988  . KNEE ARTHROSCOPY Right 1999  . LAPAROSCOPY  2001  . NASAL POLYP SURGERY  2006  . TONSILLECTOMY AND ADENOIDECTOMY  1981   Family History:  Family History  Problem Relation Age of Onset  . Cancer Maternal Grandmother   . Hyperlipidemia  Paternal Grandfather   . Miscarriages / Stillbirths Paternal Grandfather   . Heart disease Paternal Grandfather   . Clotting disorder Mother   . Kidney disease Maternal Grandfather   . Mental illness Neg Hx    Family Psychiatric  History: Please see H&P.  Social History: Please see H&P.  History  Alcohol Use No     History  Drug Use No    Social History   Social History  . Marital status: Married    Spouse name: N/A  . Number of children: N/A  . Years of education: N/A   Social History Main Topics  . Smoking status: Never Smoker  . Smokeless tobacco: Never Used  . Alcohol use No  . Drug use: No  . Sexual activity: Not Asked   Other Topics Concern  . None   Social History Narrative  . None   Additional Social History:   Sleep: "Good"  Appetite:  "Improving"  Current Medications: Current Facility-Administered Medications  Medication Dose Route Frequency Provider Last Rate Last Dose  . acetaminophen (TYLENOL) tablet 650 mg  650 mg Oral Q6H PRN [name], NP   650 mg at 04/27/16 1003  . alum & mag hydroxide-simeth (MAALOX/MYLANTA) 200-200-20 MG/5ML suspension 30 mL  30 mL Oral Q4H PRN [name], NP   30 mL at 04/17/16 1310  . benztropine (COGENTIN) tablet 1 mg  1 mg Oral QHS [name], MD   1 mg at 04/26/16 2135  . feeding supplement (ENSURE ENLIVE) (ENSURE ENLIVE)  liquid 237 mL  237 mL Oral BID BM [name], MD   237 mL at 04/16/16 1436  . gabapentin (NEURONTIN) capsule 100 mg  100 mg Oral TID [name], MD   100 mg at 04/27/16 1301  . hydrOXYzine (ATARAX/VISTARIL) tablet 25 mg  25 mg Oral TID PRN [name], NP   25 mg at 04/23/16 2301  . levothyroxine (SYNTHROID, LEVOTHROID) tablet 75 mcg  75 mcg Oral QAC breakfast [name], NP   75 mcg at 04/27/16 [phone]  . LORazepam (ATIVAN) tablet 0.5 mg  0.5 mg Oral Q6H PRN [name], MD   0.5 mg at 04/23/16 2139   Or  . LORazepam (ATIVAN) injection 0.5 mg  0.5 mg Intramuscular Q6H PRN  [name], MD      . magnesium hydroxide (MILK OF MAGNESIA) suspension 30 mL  30 mL Oral Daily PRN [name], NP      . multivitamin with minerals tablet 1 tablet  1 tablet Oral Daily [name], NP   1 tablet at 04/27/16 1003  . OLANZapine zydis (ZYPREXA) disintegrating tablet 5 mg  5 mg Oral QHS [name], MD   5 mg at 04/26/16 2135  . traZODone (DESYREL) tablet 100 mg  100 mg Oral QHS PRN [name], MD   100 mg at 04/26/16 2135  . ziprasidone (GEODON) capsule 20 mg  20 mg Oral TID PRN [name], MD   20 mg at 04/23/16 2301   Or  . ziprasidone (GEODON) injection 10 mg  10 mg Intramuscular TID PRN [name], MD       Lab Results:  No results found for this or any previous visit (from the past 48 hour(s)).  Blood Alcohol level:  Lab Results  Component Value Date   ETH <5 [id number]   Metabolic Disorder Labs: Lab Results  Component Value Date   HGBA1C 5.1 04/16/2016   MPG 100 04/16/2016   MPG 103 10/12/2015   Lab Results  Component Value Date   PROLACTIN 23.3 04/16/2016   Lab Results  Component Value Date   CHOL 160 04/16/2016   TRIG 71 04/16/2016   HDL 51 04/16/2016   CHOLHDL 3.1 04/16/2016   VLDL 14 04/16/2016   LDLCALC 95 04/16/2016   Physical Findings: AIMS: Facial and Oral Movements Muscles of Facial Expression: None, normal Lips and Perioral Area: None, normal Jaw: None, normal Tongue: None, normal,Extremity Movements Upper (arms, wrists, hands, fingers): None, normal Lower (legs, knees, ankles, toes): Minimal, Trunk Movements Neck, shoulders, hips: None, normal, Overall Severity Severity of abnormal movements (highest score from questions above): None, normal Incapacitation due to abnormal movements: None, normal Patient's awareness of abnormal movements (rate only patient's report): Aware, no distress, Dental Status Current problems with teeth and/or dentures?: No Does patient usually wear dentures?: No  CIWA:    COWS:      Musculoskeletal: Strength & Muscle Tone: within normal limits Gait & Station: normal Patient leans: N/A  Psychiatric Specialty Exam: Physical Exam  Nursing note and vitals reviewed.   Review of Systems  Psychiatric/Behavioral: The patient is nervous/anxious.   All other systems reviewed and are negative.   Blood pressure (!) 85/51, pulse 64, temperature 98.4 F (36.9 C), resp. rate 18, height 5\' 5"  (1.651 m), weight 71.3 kg (157 lb 3 oz), last menstrual period 04/02/2016, SpO2 98 %.Body mass index is 26.16 kg/m.  General Appearance: Casual and Guarded  Eye Contact:  Fair  Speech:  Normal Rate,  more spontaneous   Volume:  Normal  Mood:  Anxious- improving  Affect:  Congruent  Thought Process: goal directed Descriptions of Associations:linear  Orientation:  Full (Time, Place, and Person)  Thought Content:  Rumination  Suicidal Thoughts:  No - pt however denies any thoughts of self harm today   Homicidal Thoughts:  No  Memory:  Immediate;   Fair Recent;   Fair Remote;   Fair  Judgement:  Fair  Insight:  Fair  Psychomotor Activity:  Restlessness  Concentration:  Concentration: Fair and Attention Span: Fair  Recall:  [employer] of Knowledge:  Fair  Language:  Fair  Akathisia:  Yes improved  Handed:  Right  AIMS (if indicated):   2  Assets:  Social Support  ADL's:  Intact  Cognition:  WNL  Sleep:  Number of Hours: 6.25   Treatment Plan Summary: Patient today seen as alert, oriented, anxiety is improving, tolerating zyprexa well, continue to treat.  Schizophreniform disorder ([place]) unstable- improving   Will continue today 04/27/16  plan as below except where it is noted. Daily contact with patient to assess and evaluate symptoms and progress in treatment and Medication management   Schizophreniforn do: Discontinued Haldol due to ADRs. Will continue Zyprexa zydis 5 mg po qhs for psychosis.  Will continue Cogentin 1 mg po qhs for EPS.  For Akathisia: Will  continue Cogentin as scheduled. Haldol discontinued .  For Catatonic sx:( RESOLVED)  Discontinued Ativan due to drowsiness.  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 Will continue Gabapentin 100 mg po tid for anxiety/restlessness.  For sleep problems: Will continueTrazodone 100 mg po qhs prn .  For anxiety/agitation: PRN medications as per agitation protocol.  Continue closed observation precaution for safety/fall risk.  Collateral information obtained from family - see H&P.  Reviewed past medical records,treatment plan.   Will continue to monitor vitals ,medication compliance and treatment side effects while patient is here.   Will monitor for medical issues as well as call consult as needed.   Reviewed labs  uds - positive for BZD( likely given in ED), TSH - WNL, LIPID PANEL - wnl , AST - elevated - likely due to OTC medications used, will monitor, pregnancy test - negative , vitamin b12 - high, folate - wnl, RPR - NR., CMP - Bun -borderline high.  I have reviewed EKG for qtc - wnl.  CSW will continue working on disposition.   Patient to participate in therapeutic milieu. 04-27-16: Reviewed current treatment plan, no changes made. Continue as recommended.   Charlotte Slates, NP, P 04/27/2016, 2:32 PMPatient ID: Charlotte Wise, female   DOB: Jul 01, 1975, 41 y.o.   MRN: [id number] Patient ID: Charlotte Wise, female   DOB: 12/28/1975, 41 y.o.   MRN: [id number]

## 2016-04-27 NOTE — Progress Notes (Signed)
D: Patient's self inventory sheet: patient has good sleep, recieved sleep medication.good  Appetite, normal energy level, good concentration. Rated depression 0/10, hopeless 0/10, anxiety 3/10. SI/HI/AVH: Denies all. Physical complaints are neck discomfort, pt thinks it is due to the pillow. . Goal is "prepare to go home tomorrow". Plans to work on "keep doingt what I"m doing already, meds, participate in groups".   A: Medications administered, assessed medication knowledge and education given on medication regimen.  Emotional support and encouragement given patient. R: Denies SI and HI , contracts for safety. Safety maintained with 15 minute checks.

## 2016-04-27 NOTE — BHH Group Notes (Signed)
The focus of this group is to educate the patient on the purpose and policies of crisis stabilization and provide a format to answer questions about their admission.  The group details unit policies and expectations of patients while admitted.  Patient did not attend 0930 nurse education orientation group this morning.  Patient in room.

## 2016-04-27 NOTE — BHH Group Notes (Signed)
[facility] Group Notes:  (Clinical Social Work)  04/27/2016  11:00AM-12:00PM  Summary of Progress/Problems:  The main focus of today's process group was to listen to a variety of genres of music and to identify that different types of music provoke different responses.  The patient then was able to identify personally what was soothing for them, as well as energizing, as well as how patient can personally use this knowledge in sleep habits, with depression, and with other symptoms.  The patient expressed at the beginning of group the overall feeling of "happy because I'm leaving tomorrow."  She expressed appreciation for the music and said much of it made her feel peaceful and happy.  Type of Therapy:  Music Therapy   Participation Level:  Active  Participation Quality:  Attentive and Sharing  Affect:  Blunted  Cognitive:  Oriented  Insight:  Engaged  Engagement in Therapy:  Engaged  Modes of Intervention:   Activity, Exploration  Selmer Dominion, LCSW 04/27/2016

## 2016-04-28 MED ORDER — OLANZAPINE 5 MG PO TBDP: 5.0000 mg | ORAL_TABLET | Freq: Every day | ORAL | 0 refills | Status: DC

## 2016-04-28 MED ORDER — LEVOTHYROXINE SODIUM 75 MCG PO TABS: 75.0000 ug | ORAL_TABLET | Freq: Every day | ORAL | 0 refills | Status: DC

## 2016-04-28 MED ORDER — GABAPENTIN 100 MG PO CAPS: 100.0000 mg | ORAL_CAPSULE | Freq: Three times a day (TID) | ORAL | 0 refills | Status: DC

## 2016-04-28 MED ORDER — TRAZODONE HCL 100 MG PO TABS: 100.0000 mg | ORAL_TABLET | Freq: Every evening | ORAL | 0 refills | Status: DC | PRN

## 2016-04-28 MED ORDER — HYDROXYZINE HCL 25 MG PO TABS: 25.0000 mg | ORAL_TABLET | Freq: Three times a day (TID) | ORAL | 0 refills | Status: DC | PRN

## 2016-04-28 MED ORDER — BENZTROPINE MESYLATE 1 MG PO TABS: 1.0000 mg | ORAL_TABLET | Freq: Every day | ORAL | 0 refills | Status: DC

## 2016-04-28 NOTE — Progress Notes (Signed)
  [facility] Adult Case Management Discharge Plan :  Will you be returning to the same living situation after discharge:  Yes,  home with family At discharge, do you have transportation home?: Yes,  family/husband coming to pick patient up Do you have the ability to pay for your medications: Yes,  insurance/no barriers  Release of information consent forms completed and in the chart;  Patient's signature needed at discharge.  Patient to Follow up at: Follow-up Information    [facility] Follow up on 05/01/2016.   Specialty:  Behavioral Health Why:  Thursday  @ 9am with Jan Fireman, therapist Then Tuesday, 2/27 @ 9am with Dr. Adele Schilder, psychiatrist  Contact information: [place] [phone]          Next level of care provider has access to [facility] and Suicide Prevention discussed: Yes,  completed with patient, DSS and husband  Have you used any form of tobacco in the last 30 days? (Cigarettes, Smokeless Tobacco, Cigars, and/or Pipes): No  Has patient been referred to the Quitline?: N/A patient is not a smoker  Patient has been referred for addiction treatment: N/A  Lilly Cove 04/28/2016, 11:26 AM

## 2016-04-28 NOTE — Progress Notes (Signed)
Recreation Therapy Notes  Date: 04/28/16 Time: 1000 Location: 500 Hall Dayroom  Group Topic: Anger Management  Goal Area(s) Addresses:  Patient will identify triggers for anger.  Patient will identify physical reaction to anger.   Patient will identify benefit of using coping skills when angry.  Behavioral Response: Engaged  Intervention: Thermometer worksheet  Activity: Anger Thermometer.  Patients were to rank situations that get anger from 1-10 (1 being calm, 10 being the angriest).  Patients were to also give a small description of the situation.  Once the patients identified the situations that get them angry, they were to come up with coping skills to handle those situations.  Education: Anger Management, Discharge Planning   Education Outcome: Acknowledges education/In group clarification offered/Needs additional education.   Clinical Observations/Feedback: Pt expressed when she gets angry, she makes an angry face.  Pt stated that her husband not cleaning like her upsets her which she lets her frustrations build up until she becomes aggressive about it.  Pt explained she could "journal my feelings, speak my truth and then speak gently."  Pt stated doing these things would "help me to get my point across in a way where people want to help me."   Victorino Sparrow, LRT/CTRS       Ria Comment, Marko Skalski A 04/28/2016 1:40 PM
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
## 2016-04-28 NOTE — Tx Team (Signed)
Interdisciplinary Treatment and Diagnostic Plan Update  04/28/2016 Time of Session: 10:16 AM  CACHE DECOURSEY MRN: [id number]  Principal Diagnosis: Schizophreniform disorder [facility])  Secondary Diagnoses: Principal Problem:   Schizophreniform disorder ([place]) Active Problems:   Acute neuroleptic-induced akathisia   Current Medications:  Current Facility-Administered Medications  Medication Dose Route Frequency Provider Last Rate Last Dose  . acetaminophen (TYLENOL) tablet 650 mg  650 mg Oral Q6H PRN [name], NP   650 mg at 04/27/16 2105  . alum & mag hydroxide-simeth (MAALOX/MYLANTA) 200-200-20 MG/5ML suspension 30 mL  30 mL Oral Q4H PRN [name], NP   30 mL at 04/17/16 1310  . benztropine (COGENTIN) tablet 1 mg  1 mg Oral QHS [name], MD   1 mg at 04/27/16 2104  . gabapentin (NEURONTIN) capsule 100 mg  100 mg Oral TID [name], MD   100 mg at 04/28/16 0827  . hydrOXYzine (ATARAX/VISTARIL) tablet 25 mg  25 mg Oral TID PRN [name], NP   25 mg at 04/23/16 2301  . levothyroxine (SYNTHROID, LEVOTHROID) tablet 75 mcg  75 mcg Oral QAC breakfast [name], NP   75 mcg at 04/28/16 9702  . LORazepam (ATIVAN) tablet 0.5 mg  0.5 mg Oral Q6H PRN [name], MD   0.5 mg at 04/23/16 2139   Or  . LORazepam (ATIVAN) injection 0.5 mg  0.5 mg Intramuscular Q6H PRN [name], MD      . magnesium hydroxide (MILK OF MAGNESIA) suspension 30 mL  30 mL Oral Daily PRN [name], NP      . multivitamin with minerals tablet 1 tablet  1 tablet Oral Daily [name], NP   1 tablet at 04/28/16 0827  . OLANZapine zydis (ZYPREXA) disintegrating tablet 5 mg  5 mg Oral QHS [name], MD   5 mg at 04/27/16 2104  . traZODone (DESYREL) tablet 100 mg  100 mg Oral QHS PRN [name], MD   100 mg at 04/27/16 2337  . ziprasidone (GEODON) capsule 20 mg  20 mg Oral TID PRN [name], MD   20 mg at 04/23/16 2301   Or  . ziprasidone (GEODON) injection 10 mg  10 mg  Intramuscular TID PRN [name], MD        PTA Medications: Prescriptions Prior to Admission  Medication Sig Dispense Refill Last Dose  . carisoprodol (SOMA) 350 MG tablet TAKE 1 TABLET BY MOUTH EVERY 8 HOURS AS NEEDED FOR MUSCLE SPASMS (Patient not taking: Reported on 04/14/2016) 30 tablet 0 Not Taking at Unknown time  . levothyroxine (SYNTHROID, LEVOTHROID) 75 MCG tablet Take 1 tablet (75 mcg total) by mouth daily before breakfast. 90 tablet 1 04/14/2016 at Unknown time  . Multiple Vitamin (MULTIVITAMIN WITH MINERALS) TABS tablet Take 1 tablet by mouth daily.   Past Week at Unknown time  . pregabalin (LYRICA) 50 MG capsule Take 1 capsule (50 mg total) by mouth daily. 90 capsule 2 Past Month at Unknown time  . rifaximin (XIFAXAN) 550 MG TABS tablet Take 1 tablet (550 mg total) by mouth 3 (three) times daily. (Patient not taking: Reported on 04/14/2016) 42 tablet 0 Not Taking at Unknown time  . temazepam (RESTORIL) 30 MG capsule TAKE ONE CAPSULE BY MOUTH AS NEEDED FOR SLEEP 90 capsule 0 Past Month at Unknown time  . triamcinolone cream (KENALOG) 0.1 % Apply 1 application topically 4 (four) times daily as needed. (Patient not taking: Reported on 04/14/2016) 30 g 0  Not Taking at Unknown time    Treatment Modalities: Medication Management, Group therapy, Case management,  1 to 1 session with clinician, Psychoeducation, Recreational therapy.   Physician Treatment Plan for Primary Diagnosis: Schizophreniform disorder [facility]) Long Term Goal(s): Improvement in symptoms so as ready for discharge  Short Term Goals: Ability to verbalize feelings will improve Ability to disclose and discuss suicidal ideas Ability to demonstrate self-control will improve   Medication Management: Evaluate patient's response, side effects, and tolerance of medication regimen.  Therapeutic Interventions: 1 to 1 sessions, Unit Group sessions and Medication administration.  Evaluation of Outcomes:  Adequate for  discharge  Physician Treatment Plan for Secondary Diagnosis: Principal Problem:   Schizophreniform disorder ([place]) Active Problems:   Acute neuroleptic-induced akathisia   Long Term Goal(s): Improvement in symptoms so as ready for discharge  Short Term Goals: Ability to verbalize feelings will improve Ability to disclose and discuss suicidal ideas Ability to demonstrate self-control will improve   Medication Management: Evaluate patient's response, side effects, and tolerance of medication regimen.  Therapeutic Interventions: 1 to 1 sessions, Unit Group sessions and Medication administration.  Evaluation of Outcomes: Adequate for discharge   04/21/16: Will change Haldol to 7.5 mg po qhs for psychosis. Will continue Cogentin 0.5 mg po qhs for EPS.  For catatonic sx:( RESOLVED)  Will discontinue Ativan due to drowsiness.  For anxiety sx: Discussed adding an SSRI - but patient reports she tried it in the past for postpartum depression and had ADRs, she declines .  For sleep problems: Trazodone 100 mg po qhs prn .  RN Treatment Plan for Primary Diagnosis: Schizophreniform disorder ([place]) Long Term Goal(s): Knowledge of disease and therapeutic regimen to maintain health will improve  Short Term Goals: Ability to disclose and discuss suicidal ideas and Compliance with prescribed medications will improve  Medication Management: RN will administer medications as ordered by provider, will assess and evaluate patient's response and provide education to patient for prescribed medication. RN will report any adverse and/or side effects to prescribing provider.  Therapeutic Interventions: 1 on 1 counseling sessions, Psychoeducation, Medication administration, Evaluate responses to treatment, Monitor vital signs and CBGs as ordered, Perform/monitor CIWA, COWS, AIMS and Fall Risk screenings as ordered, Perform wound care treatments as ordered.  Evaluation of Outcomes: Adequate for  discharge  Recreational Therapy Treatment Plan for Primary Diagnosis: Schizophreniform disorder ([place]) Long Term Goal(s): LTG- Patient will participate in recreation therapy tx in at least 2 group sessions without prompting from LRT.  Short Term Goals: Patient will be able to identify at least 5 coping skills for admitting dx by conclusion of recreation therapy tx.  Treatment Modalities: Group and Pet Therapy  Therapeutic Interventions: Psychoeducation  Evaluation of Outcomes: Adequate for discharge  LCSW Treatment Plan for Primary Diagnosis: Schizophreniform disorder ([place]) Long Term Goal(s): Safe transition to appropriate next level of care at discharge, Engage patient in therapeutic group addressing interpersonal concerns.  Short Term Goals: Engage patient in aftercare planning with referrals and resources  Therapeutic Interventions: Assess for all discharge needs, 1 to 1 time with Social worker, Explore available resources and support systems, Assess for adequacy in community support network, Educate family and significant other(s) on suicide prevention, Complete Psychosocial Assessment, Interpersonal group therapy.  Evaluation of Outcomes: Met  Return home, follow up outpt    Progress in Treatment: Attending groups: Yes Participating in groups: No Taking medication as prescribed: Yes Toleration medication: Yes, no side effects reported at this time Family/Significant other contact made: Yes Patient understands  diagnosis: No  Limited insight  Discussing patient identified problems/goals with staff: Yes Medical problems stabilized or resolved: Yes Denies suicidal/homicidal ideation: Yes Issues/concerns per patient self-inventory: None Other: N/A  New problem(s) identified: None identified at this time.   New Short Term/Long Term Goal(s): None identified at this time.   Discharge Plan or Barriers:  No barriers, patient will DC home today and follow up with [facility] outpatient MD  and therapy.  Reason for Continuation of Hospitalization:  None currently, patient scheduled for discharge today.   Estimated Length of Stay: 2-4 days  Attendees: Patient: 04/28/2016  10:16 AM  Physician: [name], MD 04/28/2016  10:16 AM  Nursing: Jeanie Cooks, RN 04/28/2016  10:16 AM  RN Care Manager: Lars Pinks, RN 04/28/2016  10:16 AM  Social Worker: Evie Lacks Delayna Sparlin 04/28/2016  10:16 AM  Recreational Therapist: Laretta Bolster  04/28/2016  10:16 AM  Other: Norberto Sorenson 04/28/2016  10:16 AM  Other:  04/28/2016  10:16 AM    Scribe for Treatment Team:  Evie Lacks Marcela Alatorre LCSW 04/28/2016 10:16 AM

## 2016-04-28 NOTE — Plan of Care (Signed)
Problem: [facility] Participation in Recreation Therapeutic Interventions Goal: STG-Patient will identify at least five coping skills for ** STG: Coping Skills - Patient will be able to identify at least 5 coping skills for S/I by conclusion of recreation therapy tx  Outcome: Completed/Met Date Met: 04/28/16 Pt was able to identify coping skills at completion of anger management and stress management recreation therapy sessions.   , LRT/CTRS   
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
## 2016-04-28 NOTE — Progress Notes (Signed)
  [facility] Adult Case Management Discharge Plan :  Will you be returning to the same living situation after discharge:  Yes,  home with husband and family At discharge, do you have transportation home?: Yes,  family coming to pick up patient Do you have the ability to pay for your medications: Yes,  patient has insurance  Release of information consent forms completed and in the chart;  Patient's signature needed at discharge.  Patient to Follow up at: Follow-up Information    [facility] Follow up on 05/01/2016.   Specialty:  Behavioral Health Why:  Thursday  @ 9am with Jan Fireman, therapist Then Tuesday, 2/27 @ 9am with Dr. Adele Schilder, psychiatrist  Contact information: [place] [phone]          Next level of care provider has access to [place] and Suicide Prevention discussed: Yes,  compelted with patient and family  Have you used any form of tobacco in the last 30 days? (Cigarettes, Smokeless Tobacco, Cigars, and/or Pipes): No  Has patient been referred to the Quitline?: Patient refused referral  Patient has been referred for addiction treatment: N/A  [place] 04/28/2016, 10:18 AM

## 2016-04-28 NOTE — Discharge Summary (Signed)
Physician Discharge Summary Note  Patient:  Charlotte Wise is an 41 y.o., female MRN:  [id number] DOB:  10-Jan-1976 Patient phone:  [phone] (home)  Patient address:   [address] [place] [address],  Total Time spent with patient: 30 minutes  Date of Admission:  04/15/2016 Date of Discharge: 04/28/2016  Reason for Admission:  Psychosis with thoughts of harming husband and son  Principal Problem: Schizophreniform disorder [facility]) Discharge Diagnoses: Patient Active Problem List   Diagnosis Date Noted  . Acute neuroleptic-induced akathisia [G25.71, [id number] 04/24/2016  . Schizophreniform disorder ([place]) [F20.81] 04/23/2016  . Allergic rhinitis [J30.9] 05/05/2007  . HYPERSOMNIA, IDIOPATHIC [G47.10] 01/13/2007    Past Psychiatric History: see HPI  Past Medical History:  Past Medical History:  Diagnosis Date  . Allergy   . Anxiety   . Arthritis   . 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 Thyroid disease     Past Surgical History:  Procedure Laterality Date  . ANKLE ARTHROSCOPY Right 2000  . KNEE ARTHROSCOPY Left 1988  . KNEE ARTHROSCOPY Right 1999  . LAPAROSCOPY  2001  . NASAL POLYP SURGERY  2006  . TONSILLECTOMY AND ADENOIDECTOMY  1981   Family History:  Family History  Problem Relation Age of Onset  . Cancer Maternal Grandmother   . Hyperlipidemia Paternal Grandfather   . Miscarriages / Stillbirths Paternal Grandfather   . Heart disease Paternal Grandfather   . Clotting disorder Mother   . Kidney disease Maternal Grandfather   . Mental illness Neg Hx    Family Psychiatric  History: see HPI Social History:  History  Alcohol Use No     History  Drug Use No    Social History   Social History  . Marital status: Married    Spouse name: N/A  . Number of children: N/A  . Years of education: N/A   Social History Main Topics  . Smoking status: Never Smoker  . Smokeless tobacco: Never Used  . Alcohol  use No  . Drug use: No  . Sexual activity: Not Asked   Other Topics Concern  . None   Social History Narrative  . None    Hospital Course:  Charlotte Wise is a 41 y old female, with hx of depression during post partum period, never been treated, [facility], for psychosis and thoughts of harming her husband and son whom she lives with.  Patient's collateral information provided by her husband, stated that patient had been behaving in a bizarre manner,  too involved in the practice of Reiki and enlightenment.  Developed voices coming from a "yogi or god [place] the mountains."    Charlotte Wise was admitted for Schizophreniform disorder [facility]) and crisis management.  Patient was treated with medications with their indications listed below in detail under Medication List.  Medical problems were identified and treated as needed.  Home medications were restarted as appropriate.  Improvement was monitored by observation and Charlotte Wise daily report of symptom reduction.  Emotional and mental status was monitored by daily self inventory reports completed by Charlotte Wise and clinical staff.  Patient reported continued improvement, denied any new concerns.  Patient had been compliant on medications and denied side effects.  Support and encouragement was provided.         Charlotte Wise was evaluated by the treatment team for stability and plans for continued recovery upon discharge.  Patient was offered further treatment options  upon discharge including Residential, Intensive Outpatient and Outpatient treatment. Patient will follow up with agency listed below for medication management and counseling.  Encouraged patient to maintain satisfactory support network and home environment.  Advised to adhere to medication compliance and outpatient treatment follow up.  Prescriptions provided.       Charlotte Wise motivation was an integral factor for scheduling further treatment.  Employment,  transportation, bed availability, health status, family support, and any pending legal issues were also considered during patient's hospital stay.  Upon completion of this admission the patient was both mentally and medically stable for discharge denying suicidal/homicidal ideation, auditory/visual/tactile hallucinations, delusional thoughts and paranoia.      Physical Findings: AIMS: Facial and Oral Movements Muscles of Facial Expression: None, normal Lips and Perioral Area: None, normal Jaw: None, normal Tongue: None, normal,Extremity Movements Upper (arms, wrists, hands, fingers): None, normal Lower (legs, knees, ankles, toes): None, normal, Trunk Movements Neck, shoulders, hips: None, normal, Overall Severity Severity of abnormal movements (highest score from questions above): None, normal Incapacitation due to abnormal movements: None, normal Patient's awareness of abnormal movements (rate only patient's report): No Awareness, Dental Status Current problems with teeth and/or dentures?: No Does patient usually wear dentures?: No  CIWA:    COWS:     Musculoskeletal: Strength & Muscle Tone: within normal limits Gait & Station: normal Patient leans: N/A  Psychiatric Specialty Exam:  SEE MD SRA Physical Exam  Nursing note and vitals reviewed.   ROS  Blood pressure 107/67, pulse 71, temperature 98.2 F (36.8 C), temperature source Oral, resp. rate 16, height 5\' 5"  (1.651 m), weight 71.3 kg (157 lb 3 oz), last menstrual period 04/02/2016, SpO2 98 %.Body mass index is 26.16 kg/m.    Have you used any form of tobacco in the last 30 days? (Cigarettes, Smokeless Tobacco, Cigars, and/or Pipes): No  Has this patient used any form of tobacco in the last 30 days? (Cigarettes, Smokeless Tobacco, Cigars, and/or Pipes) Yes, N/A  Blood Alcohol level:  Lab Results  Component Value Date   ETH <5 [id number]    Metabolic Disorder Labs:  Lab Results  Component Value Date   HGBA1C 5.1  04/16/2016   MPG 100 04/16/2016   MPG 103 10/12/2015   Lab Results  Component Value Date   PROLACTIN 23.3 04/16/2016   Lab Results  Component Value Date   CHOL 160 04/16/2016   TRIG 71 04/16/2016   HDL 51 04/16/2016   CHOLHDL 3.1 04/16/2016   VLDL 14 04/16/2016   [place] 95 04/16/2016    See Psychiatric Specialty Exam and Suicide Risk Assessment completed by Attending Physician prior to discharge.  Discharge destination:  Home  Is patient on multiple antipsychotic therapies at discharge:  No   Has Patient had three or more failed trials of antipsychotic monotherapy by history:  No  Recommended Plan for Multiple Antipsychotic Therapies: NA   Allergies as of 04/28/2016      Reactions   Hydrocodone Itching   Penicillins Swelling   Has patient had a PCN reaction causing immediate rash, facial/tongue/throat swelling, SOB or lightheadedness with hypotension: No Has patient had a PCN reaction causing severe rash involving mucus membranes or skin necrosis: No Has patient had a PCN reaction that required hospitalization No Has patient had a PCN reaction occurring within the last 10 years: No If all of the above answers are "NO", then may proceed with Cephalosporin use.      Medication List    STOP taking these  medications   carisoprodol 350 MG tablet Commonly known as:  SOMA   multivitamin with minerals Tabs tablet   pregabalin 50 MG capsule Commonly known as:  LYRICA   rifaximin 550 MG Tabs tablet Commonly known as:  XIFAXAN   temazepam 30 MG capsule Commonly known as:  RESTORIL   triamcinolone cream 0.1 % Commonly known as:  KENALOG     TAKE these medications     Indication  benztropine 1 MG tablet Commonly known as:  COGENTIN Take 1 tablet (1 mg total) by mouth at bedtime.  Indication:  Extrapyramidal Reaction caused by Medications   gabapentin 100 MG capsule Commonly known as:  NEURONTIN Take 1 capsule (100 mg total) by mouth 3 (three) times daily.   Indication:  Agitation   hydrOXYzine 25 MG tablet Commonly known as:  ATARAX/VISTARIL Take 1 tablet (25 mg total) by mouth 3 (three) times daily as needed for anxiety.  Indication:  Anxiety Neurosis   levothyroxine 75 MCG tablet Commonly known as:  SYNTHROID, LEVOTHROID Take 1 tablet (75 mcg total) by mouth daily before breakfast. Start taking on:  04/29/2016  Indication:  Underactive Thyroid   OLANZapine zydis 5 MG disintegrating tablet Commonly known as:  ZYPREXA Take 1 tablet (5 mg total) by mouth at bedtime.  Indication:  mood stabilization   traZODone 100 MG tablet Commonly known as:  DESYREL Take 1 tablet (100 mg total) by mouth at bedtime as needed for sleep.  Indication:  Trouble Sleeping, Major Depressive Disorder      Follow-up [facility] Follow up on 05/01/2016.   Specialty:  Behavioral Health Why:  Thursday  @ 9am with Jan Fireman, therapist Then Tuesday, 2/27 @ 9am with Dr. Adele Schilder, psychiatrist  Contact information: [place] [phone]          Follow-up recommendations:  Activity:  as tol Diet:  as tol  Comments:  1.  Take all your medications as prescribed.   2.  Report any adverse side effects to outpatient provider. 3.  Patient instructed to not use alcohol or illegal drugs while on prescription medicines. 4.  In the event of worsening symptoms, instructed patient to call 911, the crisis hotline or go to nearest emergency room for evaluation of symptoms.  Signed: Janett Labella, NP [facility] 04/28/2016, 11:40 AM

## 2016-04-28 NOTE — Progress Notes (Signed)
D: Pt presents with flat affect and depressed mood. Forwards on conversation, brightened up as shift progressed. Denies SI, HI, AVH and pain when assessed. Pt was d/c home; picked up by husband in the lobby.  A: Scheduled medications administered as prescribed. D/C instructions reviewed with pt including prescriptions and follow up appointments. All belongings in locker 12 returned to pt prior to d/c. Q 15 minutes safety checks maintained on and off unit till time of d/c without self harm gestures or outburst. R: Pt receptive to care. Verbalized understanding related to d/c instructions. Signed belonging sheet in agreement with items received from locker. Ambulatory with a steady gait. No physical distress evident at time of d/c.

## 2016-05-01 ENCOUNTER — Encounter (HOSPITAL_COMMUNITY): Payer: Self-pay | Admitting: Psychology

## 2016-05-01 ENCOUNTER — Ambulatory Visit (INDEPENDENT_AMBULATORY_CARE_PROVIDER_SITE_OTHER): Payer: BLUE CROSS/BLUE SHIELD | Admitting: Psychology

## 2016-05-01 DIAGNOSIS — F2081 Schizophreniform disorder: Secondary | ICD-10-CM | POA: Diagnosis not present

## 2016-05-05 ENCOUNTER — Ambulatory Visit (INDEPENDENT_AMBULATORY_CARE_PROVIDER_SITE_OTHER): Payer: BLUE CROSS/BLUE SHIELD | Admitting: Internal Medicine

## 2016-05-05 ENCOUNTER — Encounter: Payer: Self-pay | Admitting: Internal Medicine

## 2016-05-05 VITALS — BP 100/58 | HR 60 | Temp 98.4°F | Resp 16 | Ht 61.0 in | Wt 145.0 lb

## 2016-05-05 DIAGNOSIS — E559 Vitamin D deficiency, unspecified: Secondary | ICD-10-CM

## 2016-05-05 DIAGNOSIS — E039 Hypothyroidism, unspecified: Secondary | ICD-10-CM

## 2016-05-05 DIAGNOSIS — F2081 Schizophreniform disorder: Secondary | ICD-10-CM

## 2016-05-05 DIAGNOSIS — Z0001 Encounter for general adult medical examination with abnormal findings: Secondary | ICD-10-CM

## 2016-05-05 DIAGNOSIS — Z0000 Encounter for general adult medical examination without abnormal findings: Secondary | ICD-10-CM | POA: Diagnosis not present

## 2016-05-05 DIAGNOSIS — Z79899 Other long term (current) drug therapy: Secondary | ICD-10-CM | POA: Diagnosis not present

## 2016-05-05 MED ORDER — LEVOTHYROXINE SODIUM 75 MCG PO TABS: 75.0000 ug | ORAL_TABLET | Freq: Every day | ORAL | 2 refills | Status: DC
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
## 2016-05-06 NOTE — Addendum Note (Signed)
Addended by: Charan Prieto A on: 05/06/2016 01:57 PM   Modules accepted: Orders

## 2016-05-13 ENCOUNTER — Ambulatory Visit (INDEPENDENT_AMBULATORY_CARE_PROVIDER_SITE_OTHER): Payer: BLUE CROSS/BLUE SHIELD | Admitting: Psychology

## 2016-05-13 DIAGNOSIS — F2081 Schizophreniform disorder: Secondary | ICD-10-CM

## 2016-05-14 NOTE — Progress Notes (Signed)
   THERAPIST PROGRESS NOTE  Session Time: 12.33pm-1.25pm  Participation Level: Active  Behavioral Response: Well GroomedAlertaffect blunted  Type of Therapy: Individual Therapy  Treatment Goals addressed: Diagnosis: Schizophreniform and goal 1.  Interventions: CBT and Other: tx planning and communication skills  Summary: Charlotte Wise is a 41 y.o. female who presents with affect blunted.  Pt reports that things are going well- she reported that her mother is down to help out and this is good.  Pt reported that she is not having any hallucinations.  Thinking is coherent and goal directed.  Pt expressed uncertainty of counseling as doesn't know what to expect.  Pt increased awareness of counseling process.  Pt was able to report that she feels that maybe she has unresolved anger from past and this has impacted her.  Pt discussed her goals for self.  Pt identified pattern of withdrawing and avoidance of emotion in past and awareness of need to improve communication.  Pt also identified that she hasnt felt joy in many years.  Pt reported that husband and her have been talking more and feels good about this.  Pt discussed how she rejected her friend during psychosis and identified that she wants to express her want for friendship w/ her but not as her nurse (this friends is a Marine scientist).     Suicidal/Homicidal: Nowithout intent/plan  Therapist Response: Assessed pt current functioning per pt report.  Explored w/pt her goals for counseling and discussed counseling process and expectations.  Processed w/pt her communication and pattern of withdrawal and avoidance- how impacting relationship and self.  Discussed assertive communication.    Plan: Return again in 2 weeks.  Diagnosis: Schizophreniform d/o    Jan Fireman, [facility] 05/14/2016

## 2016-05-20 ENCOUNTER — Ambulatory Visit (INDEPENDENT_AMBULATORY_CARE_PROVIDER_SITE_OTHER): Payer: BLUE CROSS/BLUE SHIELD | Admitting: Psychiatry

## 2016-05-20 ENCOUNTER — Encounter (HOSPITAL_COMMUNITY): Payer: Self-pay | Admitting: Psychiatry

## 2016-05-20 VITALS — BP 120/78 | HR 77 | Ht 61.0 in | Wt 156.4 lb

## 2016-05-20 DIAGNOSIS — Z888 Allergy status to other drugs, medicaments and biological substances status: Secondary | ICD-10-CM

## 2016-05-20 DIAGNOSIS — Z880 Allergy status to penicillin: Secondary | ICD-10-CM

## 2016-05-20 DIAGNOSIS — F200 Paranoid schizophrenia: Secondary | ICD-10-CM

## 2016-05-20 DIAGNOSIS — Z9889 Other specified postprocedural states: Secondary | ICD-10-CM | POA: Diagnosis not present

## 2016-05-20 DIAGNOSIS — Z811 Family history of alcohol abuse and dependence: Secondary | ICD-10-CM | POA: Diagnosis not present

## 2016-05-20 DIAGNOSIS — Z813 Family history of other psychoactive substance abuse and dependence: Secondary | ICD-10-CM | POA: Diagnosis not present

## 2016-05-20 DIAGNOSIS — Z79899 Other long term (current) drug therapy: Secondary | ICD-10-CM | POA: Diagnosis not present

## 2016-05-20 MED ORDER — BENZTROPINE MESYLATE 1 MG PO TABS: 1.0000 mg | ORAL_TABLET | Freq: Every day | ORAL | 1 refills | Status: DC

## 2016-05-20 MED ORDER — OLANZAPINE 5 MG PO TBDP: 5.0000 mg | ORAL_TABLET | Freq: Every day | ORAL | 1 refills | Status: DC
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
## 2016-05-21 ENCOUNTER — Telehealth (HOSPITAL_COMMUNITY): Payer: Self-pay

## 2016-05-21 MED ORDER — HYDROXYZINE HCL 25 MG PO TABS: ORAL_TABLET | ORAL | 0 refills | Status: DC

## 2016-05-21 NOTE — Telephone Encounter (Signed)
Patient calling, she states she needs something to help her sleep. She said while she was inpatient they gave her trazodone, but it lowered her blood pressure too much and she had stopped taking it. She tried to take last night and woke up this morning very disoriented, lightheaded and dizzy. She would like to know if you can prescribe or recommend something else.

## 2016-05-21 NOTE — Telephone Encounter (Signed)
She can try Vistaril 25 mg 1-2 capsule for insomnia

## 2016-05-21 NOTE — Addendum Note (Signed)
Addended by: Lethea Killings on: 05/21/2016 04:34 PM   Modules accepted: Orders

## 2016-05-22 ENCOUNTER — Telehealth (HOSPITAL_COMMUNITY): Payer: Self-pay

## 2016-05-22 NOTE — Telephone Encounter (Signed)
She should stop Vistaril.

## 2016-05-22 NOTE — Telephone Encounter (Signed)
Patient called back today, she tried to take the Visteril last night but said she woke up light headed again and feeling like she was going to faint. Please review and advise, thank you

## 2016-05-23 ENCOUNTER — Other Ambulatory Visit (HOSPITAL_COMMUNITY): Payer: Self-pay | Admitting: Psychiatry

## 2016-05-23 ENCOUNTER — Telehealth (HOSPITAL_COMMUNITY): Payer: Self-pay

## 2016-05-23 MED ORDER — GABAPENTIN 100 MG PO CAPS: 100.0000 mg | ORAL_CAPSULE | Freq: Three times a day (TID) | ORAL | 0 refills | Status: DC

## 2016-05-23 NOTE — Telephone Encounter (Signed)
She can try temazepam 15 mg at bedtime.  Discontinue trazodone and Vistaril.  She need to watch for sedation in the morning and should not take late in the night.

## 2016-05-23 NOTE — Telephone Encounter (Signed)
Called patient and let her know that Dr. Adele Schilder agreed to restart the Gabapentin. Patient is agreeable to this plan and will call me next week if she is not feeling better.

## 2016-05-23 NOTE — Telephone Encounter (Signed)
I called patient and advised her to stop taking the vistaril. She wants to know what she can take to sleep. Patient states the Trazodone and Vistaril both make her dizzy and lower her blood pressure. I asked her about melatonin and she said it does not work for her. Please advise, thank you

## 2016-05-23 NOTE — Telephone Encounter (Signed)
Patient called this morning and wants to start back on Gabapentin. She said that you had discussed stopping it, and she did - but she has not felt the same since. She said that when she was on the Gabapentin she did not need medication for anxiety or to sleep.  Please review and advise, thank you

## 2016-05-23 NOTE — Telephone Encounter (Signed)
Per conversation with Dr. Adele Schilder we will restart Gabapentin instead of the temazepam. Patient expressed that this is what she would like to do.

## 2016-05-28 ENCOUNTER — Ambulatory Visit (HOSPITAL_COMMUNITY): Payer: Self-pay | Admitting: Psychology

## 2016-06-09 ENCOUNTER — Encounter (HOSPITAL_COMMUNITY): Payer: Self-pay | Admitting: Clinical

## 2016-06-09 ENCOUNTER — Ambulatory Visit (INDEPENDENT_AMBULATORY_CARE_PROVIDER_SITE_OTHER): Payer: BLUE CROSS/BLUE SHIELD | Admitting: Clinical

## 2016-06-09 DIAGNOSIS — F2081 Schizophreniform disorder: Secondary | ICD-10-CM
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
## 2016-06-10 NOTE — Progress Notes (Signed)
   THERAPIST PROGRESS NOTE  Session Time: 9:00 - 9:55  Participation Level: Active  Behavioral Response: CasualAlertAnxious  Type of Therapy: Individual Therapy  Treatment Goals addressed:improve psychiatric symptoms, review and update assessment  Interventions: Motivational Interviewing  Summary: Charlotte Wise is a 41 y.o. female who presents with schizophreniform disorder   Suicidal/Homicidal: Nowithout intent/plan  Therapist Response: Charlotte Wise met with clinician for an individual session. She shared that she had met with another clinician prior but that she had not been honest with the prior clinician. She shared that she was not  because she was feeling confused and  Was  still protecting herself from the reality of the "event". Client and clinician reviewed and updated her assessment. She shared some of the things that she was worried about sharing and her concerns about sharing them.  Client and clinician discussed her goals for therapy. Client and clinician discussed the format of therapy. Client and clinician discussed briefly the thought emotion relationship. Client and clinician agreed to discuss the topic further at future session.    Plan: Return again in 1-2 weeks.  Diagnosis: Axis I: schizophreniform disorder     Cato Liburd A, LCSW 06/10/2016

## 2016-06-12 ENCOUNTER — Encounter (HOSPITAL_COMMUNITY): Payer: Self-pay | Admitting: Psychiatry

## 2016-06-12 ENCOUNTER — Ambulatory Visit (INDEPENDENT_AMBULATORY_CARE_PROVIDER_SITE_OTHER): Payer: BLUE CROSS/BLUE SHIELD | Admitting: Psychiatry

## 2016-06-12 VITALS — BP 104/68 | HR 74 | Ht 62.0 in | Wt 157.0 lb

## 2016-06-12 DIAGNOSIS — H353131 Nonexudative age-related macular degeneration, bilateral, early dry stage: Secondary | ICD-10-CM | POA: Diagnosis not present

## 2016-06-12 DIAGNOSIS — Z813 Family history of other psychoactive substance abuse and dependence: Secondary | ICD-10-CM

## 2016-06-12 DIAGNOSIS — Z811 Family history of alcohol abuse and dependence: Secondary | ICD-10-CM | POA: Diagnosis not present

## 2016-06-12 DIAGNOSIS — Z888 Allergy status to other drugs, medicaments and biological substances status: Secondary | ICD-10-CM

## 2016-06-12 DIAGNOSIS — F419 Anxiety disorder, unspecified: Secondary | ICD-10-CM | POA: Diagnosis not present

## 2016-06-12 DIAGNOSIS — Z880 Allergy status to penicillin: Secondary | ICD-10-CM | POA: Diagnosis not present

## 2016-06-12 DIAGNOSIS — F200 Paranoid schizophrenia: Secondary | ICD-10-CM

## 2016-06-12 MED ORDER — OLANZAPINE 7.5 MG PO TABS: 7.5000 mg | ORAL_TABLET | Freq: Every day | ORAL | 2 refills | Status: DC

## 2016-06-12 MED ORDER — LORAZEPAM 0.5 MG PO TABS: 0.5000 mg | ORAL_TABLET | Freq: Two times a day (BID) | ORAL | 0 refills | Status: DC

## 2016-06-12 MED ORDER — GABAPENTIN 100 MG PO CAPS: 100.0000 mg | ORAL_CAPSULE | Freq: Three times a day (TID) | ORAL | 0 refills | Status: DC

## 2016-06-12 MED ORDER — BENZTROPINE MESYLATE 1 MG PO TABS: 1.0000 mg | ORAL_TABLET | Freq: Every day | ORAL | 0 refills | Status: DC
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
## 2016-06-19 ENCOUNTER — Ambulatory Visit (INDEPENDENT_AMBULATORY_CARE_PROVIDER_SITE_OTHER): Payer: BLUE CROSS/BLUE SHIELD | Admitting: Clinical

## 2016-06-19 DIAGNOSIS — F2081 Schizophreniform disorder: Secondary | ICD-10-CM

## 2016-06-25 ENCOUNTER — Encounter (HOSPITAL_COMMUNITY): Payer: Self-pay | Admitting: Clinical

## 2016-06-25 NOTE — Progress Notes (Signed)
   THERAPIST PROGRESS NOTE  Session Time: 3:30 -4:25  Participation Level: Active  Behavioral Response: CasualAlertAnxious and Depressed  Type of Therapy: Individual Therapy  Treatment Goals addressed: Improve Psychiatric Symptoms,  improve unhelpful thought patterns,  emotional regulation skills,, learn about diagnosis, healthy coping skills   Interventions: Motivational Interviewing, CBT, Grounding & Mindfulness Techniques, Psychoeducation  Summary: Charlotte Wise is a 41 y.o. female who presents with Schizophreniform disorder  Suicidal/Homicidal: No -without intent/plan  Therapist Response: Naydene met with clinician for an individual session. [name] discussed her psychiatric symptoms, her current life events and her goals for therapy. [name] shared that she is having a challenging time with her thoughts about the events that put her in the hospital. Client and clinician discussed her diagnosis and her symptoms. Clinician asked open ended questions and [name] shared some of her negative thoughts. Clinician introduced some basic cbt concepts.  Client and clinician discussed how our thoughts affect our emotions and actions. Clinician then introduced grounding and mindfulness techniques. Clinician explained  The process, purpose, and practice of the techniques. Client and clinician practiced some of the techniques together. Client and clinician agreed to discuss her thoughts further at future sessions.  Plan: Return again in 1-2 weeks  Diagnosis:     Axis I: Schizophreniform disorder   Alpha Chouinard A, LCSW 06/25/2016  Note created late due to technical difficulties

## 2016-06-26 ENCOUNTER — Encounter (HOSPITAL_COMMUNITY): Payer: Self-pay | Admitting: Clinical

## 2016-06-26 ENCOUNTER — Ambulatory Visit (INDEPENDENT_AMBULATORY_CARE_PROVIDER_SITE_OTHER): Payer: BLUE CROSS/BLUE SHIELD | Admitting: Clinical

## 2016-06-26 DIAGNOSIS — F2081 Schizophreniform disorder: Secondary | ICD-10-CM

## 2016-06-26 NOTE — Progress Notes (Signed)
THERAPIST PROGRESS NOTE  Session Time: 10:00 -10:55  Participation Level: Active  Behavioral Response: CasualAlertAnxious and Depressed  Type of Therapy: Individual Therapy  Treatment Goals addressed: Improve Psychiatric Symptoms, reduce psychosis and delusions, improve unhelpful thought patterns,  emotional regulation skills (stress management), elevate mood (increased enjoyment of life), learn about diagnosis, improve coping skills   Interventions: Motivational Interviewing, CBT, Grounding & Mindfulness Techniques, Psychoeducation  Summary: Charlotte Wise is a 41 y.o. female who presents with Schizophreniform disorder  Suicidal/Homicidal: No -without intent/plan  Therapist Response: [name] met with clinician for an individual session. [name] discussed her psychiatric symptoms and her current life events. [name] shared that her  symptoms were slightly less intense this week. Clinician asked open ended questions and [name] shared about her experience with the delusions. She shared her current understanding of the delusions. Client and clinician discussed some of her delusions. Client and clinician discussed how to use her grounding techniques to interrupt the delusions and negative thoughts. Client and clinician discussed how to tell the difference between inner guidance and delusions. [name] had the insight that following the delusions made her feel angry and sad, and to behave in ways she had not before. Client and clinician discussed when she how her healthier thoughts make her feel more positive and are in alignment with her values. Client and clinician reviewed and discussed her homework, distress tolerance packet 1 & 2.   Plan: Return again in 1-2 weeks  Diagnosis:     Axis I: Schizophreniform disorder    Patric Vanpelt A, LCSW 06/26/2016

## 2016-06-30 ENCOUNTER — Encounter: Payer: Self-pay | Admitting: Internal Medicine

## 2016-06-30 ENCOUNTER — Ambulatory Visit (INDEPENDENT_AMBULATORY_CARE_PROVIDER_SITE_OTHER): Payer: BLUE CROSS/BLUE SHIELD | Admitting: Internal Medicine

## 2016-06-30 VITALS — BP 112/60 | HR 68 | Temp 98.0°F | Resp 14 | Ht 61.0 in | Wt 162.0 lb

## 2016-06-30 DIAGNOSIS — R7303 Prediabetes: Secondary | ICD-10-CM | POA: Diagnosis not present

## 2016-06-30 DIAGNOSIS — E039 Hypothyroidism, unspecified: Secondary | ICD-10-CM | POA: Diagnosis not present

## 2016-06-30 DIAGNOSIS — T43595A Adverse effect of other antipsychotics and neuroleptics, initial encounter: Secondary | ICD-10-CM

## 2016-06-30 DIAGNOSIS — E782 Mixed hyperlipidemia: Secondary | ICD-10-CM

## 2016-06-30 LAB — LIPID PANEL
Cholesterol: 153 mg/dL (ref <200)
HDL: 53 mg/dL (ref >50)
LDL Cholesterol: 78 mg/dL (ref <100)
Total CHOL/HDL Ratio: 2.9 Ratio (ref <5.0)
Triglycerides: 110 mg/dL (ref <150)
VLDL: 22 mg/dL (ref <30)

## 2016-06-30 LAB — TSH: TSH: 1.5 mIU/L

## 2016-06-30 MED ORDER — LEVOTHYROXINE SODIUM 75 MCG PO TABS: 75.0000 ug | ORAL_TABLET | Freq: Every day | ORAL | 2 refills | Status: DC

## 2016-06-30 NOTE — Progress Notes (Signed)
Assessment and Plan:  1. Hypothyroidism, unspecified type  - TSH  2. Mixed hyperlipidemia  - Lipid panel  3. Prediabetes  - Hemoglobin A1c  4. Adverse effect of other antipsychotics and neuroleptics, initial encounter -patient presenting with rapid weight gain over the last 2 months.  This is likely from atypical antipsychotic use for her severe depression which is being treated by Dr. Adele Schilder.  Will check labs to ensure that this is not causing hyperglycemia or issues with thyroid medication.  Will contact Dr. Adele Schilder about use of phentermine vs. wellbutrin and whether it will impact his treatment plan.      HPI 41 y.o.female presents for presentation of weight gain.  She reports that she would like to go on a weight loss medication.  She has gained 20 lbs since her last visit here on 05/05/16.  She reports that she is very concerned about this because she spent most of last year losing weight.  She reports that the medication she is on she is likely going to be on for a year.  She reports that she feels better when her body weight is less.  She feels like she is always hungry.  She reports that this triggered an insatiable hunger in her.  She is following with Dr. Adele Schilder.  She reports that she can't stop herself.    She wants phentermine to go to [employer] if we order this.  She has been on neither phentermine or wellbutrin in the past.    Past Medical History:  Diagnosis Date  . Allergy   . Anxiety   . Arthritis   . Major depressive disorder, 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[place]) 04/15/2016  . Thyroid disease      Allergies  Allergen Reactions  . Hydrocodone Itching  . Penicillins Swelling    Has patient had a PCN reaction causing immediate rash, facial/tongue/throat swelling, SOB or lightheadedness with hypotension: No Has patient had a PCN reaction causing severe rash involving mucus membranes or skin necrosis: No Has patient had a PCN reaction that  required hospitalization No Has patient had a PCN reaction occurring within the last 10 years: No If all of the above answers are "NO", then may proceed with Cephalosporin use.      Current Outpatient Prescriptions on File Prior to Visit  Medication Sig Dispense Refill  . benztropine (COGENTIN) 1 MG tablet Take 1 tablet (1 mg total) by mouth at bedtime. 30 tablet 0  . gabapentin (NEURONTIN) 100 MG capsule Take 1 capsule (100 mg total) by mouth 3 (three) times daily. 90 capsule 0  . levothyroxine (SYNTHROID, LEVOTHROID) 75 MCG tablet Take 1 tablet (75 mcg total) by mouth daily before breakfast. 90 tablet 2  . LORazepam (ATIVAN) 0.5 MG tablet Take 1 tablet (0.5 mg total) by mouth 2 (two) times daily. 60 tablet 0  . OLANZapine (ZYPREXA) 7.5 MG tablet Take 1 tablet (7.5 mg total) by mouth at bedtime. 30 tablet 2   No current facility-administered medications on file prior to visit.     ROS: all negative except above.   Physical Exam: Filed Weights   06/30/16 0903  Weight: 162 lb (73.5 kg)   BP 112/60   Pulse 68   Temp 98 F (36.7 C) (Temporal)   Resp 14   Ht 5\' 1"  (1.549 m)   Wt 162 lb (73.5 kg)   BMI 30.61 kg/m  General Appearance: Well developed well nourished, non-toxic appearing in no apparent distress. Eyes: PERRLA,  EOMs, conjunctiva w/ no swelling or erythema or discharge Sinuses: No Frontal/maxillary tenderness ENT/Mouth: Ear canals clear without swelling or erythema.  TM's normal bilaterally with no retractions, bulging, or loss of landmarks.   Neck: Supple, thyroid normal, no notable JVD  Respiratory: Respiratory effort normal, Clear breath sounds anteriorly and posteriorly bilaterally without rales, rhonchi, wheezing or stridor. No retractions or accessory muscle usage. Cardio: RRR with no MRGs.   Abdomen: Soft, + BS.  Non tender, no guarding, rebound, hernias, masses.  Musculoskeletal: Full ROM, 5/5 strength, normal gait.  Skin: Warm, dry without rashes  Neuro: Awake  and oriented X 3, Cranial nerves intact. Normal muscle tone, no cerebellar symptoms. Sensation intact.  Psych: normal affect, Insight and Judgment appropriate.     Starlyn Skeans, PA-C 9:15 AM [facility]

## 2016-06-30 NOTE — Patient Instructions (Signed)
Phentermine tablets or capsules What is this medicine? PHENTERMINE (FEN ter meen) decreases your appetite. It is used with a reduced calorie diet and exercise to help you lose weight. This medicine may be used for other purposes; ask your health care provider or pharmacist if you have questions. COMMON BRAND NAME(S): Adipex-P, Atti-Plex P, Atti-Plex P Spansule, Fastin, Lomaira, Pro-Fast, Tara-8 What should I tell my health care provider before I take this medicine? They need to know if you have any of these conditions: -agitation -glaucoma -heart disease -high blood pressure -history of substance abuse -lung disease called Primary Pulmonary Hypertension (PPH) -taken an MAOI like Carbex, Eldepryl, Marplan, Nardil, or Parnate in last 14 days -thyroid disease -an unusual or allergic reaction to phentermine, other medicines, foods, dyes, or preservatives -pregnant or trying to get pregnant -breast-feeding How should I use this medicine? Take this medicine by mouth with a glass of water. Follow the directions on the prescription label. The instructions for use may differ based on the product and dose you are taking. Avoid taking this medicine in the evening. It may interfere with sleep. Take your doses at regular intervals. Do not take your medicine more often than directed. Talk to your pediatrician regarding the use of this medicine in children. While this drug may be prescribed for children 17 years or older for selected conditions, precautions do apply. Overdosage: If you think you have taken too much of this medicine contact a poison control center or emergency room at once. NOTE: This medicine is only for you. Do not share this medicine with others. What if I miss a dose? If you miss a dose, take it as soon as you can. If it is almost time for your next dose, take only that dose. Do not take double or extra doses. What may interact with this medicine? Do not take this medicine with any of  the following medications: -duloxetine -MAOIs like Carbex, Eldepryl, Marplan, Nardil, and Parnate -medicines for colds or breathing difficulties like pseudoephedrine or phenylephrine -procarbazine -sibutramine -SSRIs like citalopram, escitalopram, fluoxetine, fluvoxamine, paroxetine, and sertraline -stimulants like dexmethylphenidate, methylphenidate or modafinil -venlafaxine This medicine may also interact with the following medications: -medicines for diabetes This list may not describe all possible interactions. Give your health care provider a list of all the medicines, herbs, non-prescription drugs, or dietary supplements you use. Also tell them if you smoke, drink alcohol, or use illegal drugs. Some items may interact with your medicine. What should I watch for while using this medicine? Notify your physician immediately if you become short of breath while doing your normal activities. Do not take this medicine within 6 hours of bedtime. It can keep you from getting to sleep. Avoid drinks that contain caffeine and try to stick to a regular bedtime every night. This medicine was intended to be used in addition to a healthy diet and exercise. The best results are achieved this way. This medicine is only indicated for short-term use. Eventually your weight loss may level out. At that point, the drug will only help you maintain your new weight. Do not increase or in any way change your dose without consulting your doctor. You may get drowsy or dizzy. Do not drive, use machinery, or do anything that needs mental alertness until you know how this medicine affects you. Do not stand or sit up quickly, especially if you are an older patient. This reduces the risk of dizzy or fainting spells. Alcohol may increase dizziness and drowsiness. Avoid alcoholic   drinks. What side effects may I notice from receiving this medicine? Side effects that you should report to your doctor or health care professional as  soon as possible: -chest pain, palpitations -depression or severe changes in mood -increased blood pressure -irritability -nervousness or restlessness -severe dizziness -shortness of breath -problems urinating -unusual swelling of the legs -vomiting Side effects that usually do not require medical attention (report to your doctor or health care professional if they continue or are bothersome): -blurred vision or other eye problems -changes in sexual ability or desire -constipation or diarrhea -difficulty sleeping -dry mouth or unpleasant taste -headache -nausea This list may not describe all possible side effects. Call your doctor for medical advice about side effects. You may report side effects to FDA at 1-800-FDA-1088. Where should I keep my medicine? Keep out of the reach of children. This medicine can be abused. Keep your medicine in a safe place to protect it from theft. Do not share this medicine with anyone. Selling or giving away this medicine is dangerous and against the law. This medicine may cause accidental overdose and death if taken by other adults, children, or pets. Mix any unused medicine with a substance like cat litter or coffee grounds. Then throw the medicine away in a sealed container like a sealed bag or a coffee can with a lid. Do not use the medicine after the expiration date. Store at room temperature between 20 and 25 degrees C (68 and 77 degrees F). Keep container tightly closed. NOTE: This sheet is a summary. It may not cover all possible information. If you have questions about this medicine, talk to your doctor, pharmacist, or health care provider.  2018 Elsevier/Gold Standard (2014-12-15 12:53:15)  Bupropion extended-release tablets (Depression/Mood Disorders) What is this medicine? BUPROPION (byoo PROE pee on) is used to treat depression. This medicine may be used for other purposes; ask your health care provider or pharmacist if you have  questions. COMMON BRAND NAME(S): Aplenzin, Budeprion XL, Forfivo XL, Wellbutrin XL What should I tell my health care provider before I take this medicine? They need to know if you have any of these conditions: -an eating disorder, such as anorexia or bulimia -bipolar disorder or psychosis -diabetes or high blood sugar, treated with medication -glaucoma -head injury or brain tumor -heart disease, previous heart attack, or irregular heart beat -high blood pressure -kidney or liver disease -seizures (convulsions) -suicidal thoughts or a previous suicide attempt -Tourette's syndrome -weight loss -an unusual or allergic reaction to bupropion, other medicines, foods, dyes, or preservatives -breast-feeding -pregnant or trying to become pregnant How should I use this medicine? Take this medicine by mouth with a glass of water. Follow the directions on the prescription label. You can take it with or without food. If it upsets your stomach, take it with food. Do not crush, chew, or cut these tablets. This medicine is taken once daily at the same time each day. Do not take your medicine more often than directed. Do not stop taking this medicine suddenly except upon the advice of your doctor. Stopping this medicine too quickly may cause serious side effects or your condition may worsen. A special MedGuide will be given to you by the pharmacist with each prescription and refill. Be sure to read this information carefully each time. Talk to your pediatrician regarding the use of this medicine in children. Special care may be needed. Overdosage: If you think you have taken too much of this medicine contact a poison control center or  emergency room at once. NOTE: This medicine is only for you. Do not share this medicine with others. What if I miss a dose? If you miss a dose, skip the missed dose and take your next tablet at the regular time. Do not take double or extra doses. What may interact with this  medicine? Do not take this medicine with any of the following medications: -linezolid -MAOIs like Azilect, Carbex, Eldepryl, Marplan, Nardil, and Parnate -methylene blue (injected into a vein) -other medicines that contain bupropion like Zyban This medicine may also interact with the following medications: -alcohol -certain medicines for anxiety or sleep -certain medicines for blood pressure like metoprolol, propranolol -certain medicines for depression or psychotic disturbances -certain medicines for HIV or AIDS like efavirenz, lopinavir, nelfinavir, ritonavir -certain medicines for irregular heart beat like propafenone, flecainide -certain medicines for Parkinson's disease like amantadine, levodopa -certain medicines for seizures like carbamazepine, phenytoin, phenobarbital -cimetidine -clopidogrel -cyclophosphamide -digoxin -furazolidone -isoniazid -nicotine -orphenadrine -procarbazine -steroid medicines like prednisone or cortisone -stimulant medicines for attention disorders, weight loss, or to stay awake -tamoxifen -theophylline -thiotepa -ticlopidine -tramadol -warfarin This list may not describe all possible interactions. Give your health care provider a list of all the medicines, herbs, non-prescription drugs, or dietary supplements you use. Also tell them if you smoke, drink alcohol, or use illegal drugs. Some items may interact with your medicine. What should I watch for while using this medicine? Tell your doctor if your symptoms do not get better or if they get worse. Visit your doctor or health care professional for regular checks on your progress. Because it may take several weeks to see the full effects of this medicine, it is important to continue your treatment as prescribed by your doctor. Patients and their families should watch out for new or worsening thoughts of suicide or depression. Also watch out for sudden changes in feelings such as feeling anxious,  agitated, panicky, irritable, hostile, aggressive, impulsive, severely restless, overly excited and hyperactive, or not being able to sleep. If this happens, especially at the beginning of treatment or after a change in dose, call your health care professional. Avoid alcoholic drinks while taking this medicine. Drinking large amounts of alcoholic beverages, using sleeping or anxiety medicines, or quickly stopping the use of these agents while taking this medicine may increase your risk for a seizure. Do not drive or use heavy machinery until you know how this medicine affects you. This medicine can impair your ability to perform these tasks. Do not take this medicine close to bedtime. It may prevent you from sleeping. Your mouth may get dry. Chewing sugarless gum or sucking hard candy, and drinking plenty of water may help. Contact your doctor if the problem does not go away or is severe. The tablet shell for some brands of this medicine does not dissolve. This is normal. The tablet shell may appear whole in the stool. This is not a cause for concern. What side effects may I notice from receiving this medicine? Side effects that you should report to your doctor or health care professional as soon as possible: -allergic reactions like skin rash, itching or hives, swelling of the face, lips, or tongue -breathing problems -changes in vision -confusion -elevated mood, decreased need for sleep, racing thoughts, impulsive behavior -fast or irregular heartbeat -hallucinations, loss of contact with reality -increased blood pressure -redness, blistering, peeling or loosening of the skin, including inside the mouth -seizures -suicidal thoughts or other mood changes -unusually weak or tired -vomiting Side  effects that usually do not require medical attention (report to your doctor or health care professional if they continue or are bothersome): -constipation -headache -loss of  appetite -nausea -tremors -weight loss This list may not describe all possible side effects. Call your doctor for medical advice about side effects. You may report side effects to FDA at 1-800-FDA-1088. Where should I keep my medicine? Keep out of the reach of children. Store at room temperature between 15 and 30 degrees C (59 and 86 degrees F). Throw away any unused medicine after the expiration date. NOTE: This sheet is a summary. It may not cover all possible information. If you have questions about this medicine, talk to your doctor, pharmacist, or health care provider.  2018 Elsevier/Gold Standard (2015-08-31 13:55:13)

## 2016-07-01 LAB — HEMOGLOBIN A1C
Hgb A1c MFr Bld: 4.9 % (ref <5.7)
Mean Plasma Glucose: 94 mg/dL

## 2016-07-02 ENCOUNTER — Ambulatory Visit (INDEPENDENT_AMBULATORY_CARE_PROVIDER_SITE_OTHER): Payer: BLUE CROSS/BLUE SHIELD | Admitting: Clinical

## 2016-07-02 ENCOUNTER — Ambulatory Visit (HOSPITAL_COMMUNITY): Payer: Self-pay | Admitting: Psychiatry

## 2016-07-02 ENCOUNTER — Encounter (HOSPITAL_COMMUNITY): Payer: Self-pay | Admitting: Clinical

## 2016-07-02 DIAGNOSIS — F2081 Schizophreniform disorder: Secondary | ICD-10-CM

## 2016-07-02 NOTE — Progress Notes (Signed)
   THERAPIST PROGRESS NOTE  Session Time: 9:05 -10:00  Participation Level: Active  Behavioral Response: CasualAlertDepressed  Type of Therapy: Individual Therapy  Treatment Goals addressed: Improve Psychiatric Symptoms, improve unhelpful thought patterns, interpersonal relationship skills,  coping skills   Interventions: Motivational Interviewing, CBT, G Psychoeducation  Summary: Charlotte Wise is a 41 y.o. female who presents with Schizophreniform disorder  Suicidal/Homicidal: No -without intent/plan  Therapist Response: Charlotte Wise met with clinician for an individual session. Charlotte Wise discussed her psychiatric symptoms, her current life events, and her homework. Charlotte Wise shared she has been feeling more stable. She stated that she is concerned about the weight gain she has had since starting the mental health medication. Client and clinician discussed her diagnosis. Clinician encouraged her to speak to her psychiatrist about the medication but to continue taking it until she consulted with her psychiatrist. Client and clinician reviewed and discussed her homework (stress tolerance #3). Charlotte Wise shared about some of her fears that cause her to isolate. Clinician asked open ended questions and Charlotte Wise identified the evidence for and against the thoughts. She was then able to formulate healthier alternative thoughts. Client and clinician discussed the process, as well as how her actions and emotions might change if she believed the healthier alternative thoughts. Client and clinician discussed interpersonal relationship skills and challenging her isolation. [name] agreed to complete packet 4 before next session as well as to continue practicing her grounding and mindfulness techniques  Plan: Return again in 1-2 weeks  Diagnosis:     Axis I: Schizophreniform disorder    Charlotte Wise A, LCSW 07/02/2016

## 2016-07-03 ENCOUNTER — Ambulatory Visit (HOSPITAL_COMMUNITY): Payer: Self-pay | Admitting: Clinical

## 2016-07-08 NOTE — Addendum Note (Signed)
Addended by: [name] A on: 07/08/2016 09:36 AM   Modules accepted: Level of Service

## 2016-07-10 ENCOUNTER — Ambulatory Visit (INDEPENDENT_AMBULATORY_CARE_PROVIDER_SITE_OTHER): Payer: BLUE CROSS/BLUE SHIELD | Admitting: Clinical

## 2016-07-10 ENCOUNTER — Encounter (HOSPITAL_COMMUNITY): Payer: Self-pay | Admitting: Clinical

## 2016-07-10 DIAGNOSIS — F2081 Schizophreniform disorder: Secondary | ICD-10-CM

## 2016-07-10 NOTE — Progress Notes (Signed)
   THERAPIST PROGRESS NOTE  Session Time: 10:02 - 10:58  Participation Level: Active  Behavioral Response: CasualAlertDepressed   Type of Therapy: Individual Therapy  Treatment Goals addressed: Improve Psychiatric Symptoms, reduce psychosis and delusions, improve unhelpful thought patterns,  elevate mood, improve coping skills   Interventions: Motivational Interviewing, CBT, Grounding & Mindfulness Techniques,   Summary: Charlotte Wise is a 41 y.o. female who presents with Schizophreniform disorder  Suicidal/Homicidal: No -without intent/plan  Therapist Response: Sydny met with clinician for an individual session. Orianna discussed her psychiatric symptoms and her current life events. Namya shared that she has been doing a little bit better. She shared that she had completed her homework distress tolerance #4. Tityana shared how the information applied to her. Arielys shared about a few events that caused her to experience negative thoughts and emotions. She recognized that these are recurrent thoughts though the situation might change. Client and clinician used a 7 panel thought record sheet to challenge her negative thoughts.  Clinician asked open ended questions Gracelyn identified her negative automatic thoughts, she was then able to formulate healthier alternative thoughts. Client and clinician discussed how her behaviors and perceptions would change if her thoughts changed. Client and clinician reviewed how to use grounding to interrupt her ruminating thoughts. She agreed to continue practicing until next session.   Plan: Return again in 1-2 weeks  Diagnosis:     Axis I: Schizophreniform disorder                                 Aras Albarran A, LCSW 07/10/2016

## 2016-07-14 ENCOUNTER — Encounter (HOSPITAL_COMMUNITY): Payer: Self-pay | Admitting: Psychiatry

## 2016-07-14 ENCOUNTER — Ambulatory Visit (INDEPENDENT_AMBULATORY_CARE_PROVIDER_SITE_OTHER): Payer: BLUE CROSS/BLUE SHIELD | Admitting: Psychiatry

## 2016-07-14 VITALS — BP 118/72 | HR 66 | Ht 61.0 in | Wt 162.8 lb

## 2016-07-14 DIAGNOSIS — Z813 Family history of other psychoactive substance abuse and dependence: Secondary | ICD-10-CM | POA: Diagnosis not present

## 2016-07-14 DIAGNOSIS — Z79899 Other long term (current) drug therapy: Secondary | ICD-10-CM | POA: Diagnosis not present

## 2016-07-14 DIAGNOSIS — F200 Paranoid schizophrenia: Secondary | ICD-10-CM

## 2016-07-14 DIAGNOSIS — Z811 Family history of alcohol abuse and dependence: Secondary | ICD-10-CM

## 2016-07-14 DIAGNOSIS — F419 Anxiety disorder, unspecified: Secondary | ICD-10-CM

## 2016-07-14 MED ORDER — BENZTROPINE MESYLATE 1 MG PO TABS: 1.0000 mg | ORAL_TABLET | Freq: Every day | ORAL | 0 refills | Status: DC

## 2016-07-14 MED ORDER — ARIPIPRAZOLE 5 MG PO TABS: 5.0000 mg | ORAL_TABLET | Freq: Every day | ORAL | 0 refills | Status: DC
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
## 2016-07-17 ENCOUNTER — Encounter (HOSPITAL_COMMUNITY): Payer: Self-pay | Admitting: Clinical

## 2016-07-17 ENCOUNTER — Ambulatory Visit (INDEPENDENT_AMBULATORY_CARE_PROVIDER_SITE_OTHER): Payer: BLUE CROSS/BLUE SHIELD | Admitting: Clinical

## 2016-07-17 DIAGNOSIS — F2081 Schizophreniform disorder: Secondary | ICD-10-CM | POA: Diagnosis not present

## 2016-07-17 NOTE — Progress Notes (Signed)
   THERAPIST PROGRESS NOTE  Session Time: 9:57 -10:55  Participation Level: Active  Behavioral Response: CasualAlertDepressed  Type of Therapy: Individual Therapy  Treatment Goals addressed: Improve Psychiatric Symptoms,  improve unhelpful thought patterns, interpersonal relationship skills elevate mood, learn about diagnosis, improve coping skills   Interventions: Motivational Interviewing, CBT, Psychoeducation  Summary: Charlotte Wise is a 41 y.o. female who presents with Schizophreniform disorder  Suicidal/Homicidal: No -without intent/plan  Therapist Response: Charlotte Wise met with clinician for an individual session. Charlotte Wise discussed her psychiatric symptoms and her current life events. Charlotte Wise shared that she has been feeling nauseous and been having headaches. She shared that her medication has been changed from Zyprexa to Abilify and the transition is challenging. However the amount of weight she was gaining on Zyprexa was unacceptable to her. Client and clinician  She shared that she completed her homework Depression 1&2. Client and clinician reviewed and discussed her homework. Clinician gave her packet 3. Clinician asked open ended questions and Charlotte Wise shared about her relationships. Client and clinician discussed her desired outcome. Clinician asked open ended questions and Charlotte Wise had the insight that her thoughts and actions did not align with her desired outcome. Client and clinician discussed how to challenge and change her negative thoughts and behaviors.  Plan: Return again in 1-2 weeks  Diagnosis:     Axis I: Schizophreniform disorder    Sharlette Jansma A, LCSW 07/17/2016

## 2016-07-24 ENCOUNTER — Ambulatory Visit (INDEPENDENT_AMBULATORY_CARE_PROVIDER_SITE_OTHER): Payer: BLUE CROSS/BLUE SHIELD | Admitting: Clinical

## 2016-07-24 ENCOUNTER — Encounter (HOSPITAL_COMMUNITY): Payer: Self-pay | Admitting: Clinical

## 2016-07-24 DIAGNOSIS — F2081 Schizophreniform disorder: Secondary | ICD-10-CM

## 2016-07-24 NOTE — Progress Notes (Signed)
   THERAPIST PROGRESS NOTE  Session Time: 10:58 -11:55  Participation Level: Active  Behavioral Response: CasualAlertDepressed  Type of Therapy: Individual Therapy  Treatment Goals addressed: Improve Psychiatric Symptoms, reduce psychosis and delusions, improve unhelpful thought patterns, emotional regulation skills ,  learn about diagnosis, improve coping skills   Interventions: Motivational Interviewing, CBT, Grounding & Mindfulness Techniques, Psychoeducation  Summary: Charlotte Wise is a 41 y.o. female who presents with Schizophreniform disorder  Suicidal/Homicidal: No -without intent/plan  Therapist Response: [name] met with clinician for an individual session. [name] discussed her psychiatric symptoms and her current life events. [name] shared that she continues to experience some depression and struggles with some of her challenging thoughts. She shared that she was feeling better a bout her medication and was no longer having challenging transition to Abilify. She shared that she completed her homework packet depression packet 3 client and clinician reviewed and discussed the packet and how to apply to her. [name] shared that she had completed a 7 panel thought record sheet. Client clinician reviewed the septum panel thought record sheet clinician asked open ended questions and clarifying questions. [name] expanded on her answers. [name] shared her insight a bout her need for approval from others. Clinician gave her packet 4 which she agreed to complete before next session. She agreed to complete some 7 panel thought record sheets and bring them back to next session. Clanging clinician discussed her grounding and mindfulness techniques. She agreed to continue to practice  Plan: Return again in 1-2 weeks  Diagnosis:     Axis I: Schizophreniform disorder    Alp Goldwater A, LCSW 07/24/2016

## 2016-07-28 ENCOUNTER — Telehealth (HOSPITAL_COMMUNITY): Payer: Self-pay

## 2016-07-28 NOTE — Telephone Encounter (Signed)
Medication problem - Left pt a message her message was received with complaint she is getting increased body movement with recent change to Abilify. States Cogentin not helpful at current dose and would like to know if Dr. Adele Schilder would like to increase or change medication. Informed on message left for patient Dr. Adele Schilder was out today but would send message to him with concerns as he will be back in the office on 07/29/16.  Requested patient call back today if needs something more immediate.

## 2016-07-29 NOTE — Telephone Encounter (Signed)
I called patient and let her know that Dr. Adele Schilder said to cut Abilify in half and take 2.5 mg. Patient comes back in on 5/21 and they can discuss further at that time. Patient voiced her understanding.

## 2016-07-31 ENCOUNTER — Ambulatory Visit (HOSPITAL_COMMUNITY): Payer: Self-pay | Admitting: Clinical

## 2016-08-07 ENCOUNTER — Encounter (HOSPITAL_COMMUNITY): Payer: Self-pay | Admitting: Clinical

## 2016-08-07 ENCOUNTER — Ambulatory Visit (INDEPENDENT_AMBULATORY_CARE_PROVIDER_SITE_OTHER): Payer: BLUE CROSS/BLUE SHIELD | Admitting: Clinical

## 2016-08-07 ENCOUNTER — Telehealth (HOSPITAL_COMMUNITY): Payer: Self-pay

## 2016-08-07 DIAGNOSIS — F2081 Schizophreniform disorder: Secondary | ICD-10-CM | POA: Diagnosis not present

## 2016-08-07 DIAGNOSIS — F200 Paranoid schizophrenia: Secondary | ICD-10-CM

## 2016-08-07 MED ORDER — QUETIAPINE FUMARATE 50 MG PO TABS: 50.0000 mg | ORAL_TABLET | Freq: Every day | ORAL | 0 refills | Status: DC
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
## 2016-08-07 NOTE — Telephone Encounter (Signed)
Medication management - Patient in to see [name], therapist today and reports she recently decreased Abilify to 2.5 mg a day and had improvement with movement and feeling like she was coming out of her skin.  States she feeling has returned and is taking Cogentin 1 mg.  Patient describes feeling as akathisia and agreed to discuss with Dr. Adele Schilder.  Met with Dr. Adele Schilder who instructed for patient to stop Abilify and Cogentin and to begin Seroquel 50 mg, one at bedtime, #30 with no refills and to start medication and return to see him on 06/11/16 for follow up.  Met with patient to provide these instructions and warned patient medication may cause sedation so to make sure she takes medication at night.  Patient to call if any adverse side effects to medication and agreed to e-scribed in order to her [place] per verbal order by Dr. Adele Schilder.  Patient to pick up and start today at bedtime.  E-scribed Seroquel order as verbally ordered by Dr. Adele Schilder and discontinued Abilify and Cogentin.

## 2016-08-11 ENCOUNTER — Ambulatory Visit (INDEPENDENT_AMBULATORY_CARE_PROVIDER_SITE_OTHER): Payer: BLUE CROSS/BLUE SHIELD | Admitting: Psychiatry

## 2016-08-11 ENCOUNTER — Encounter (HOSPITAL_COMMUNITY): Payer: Self-pay | Admitting: Psychiatry

## 2016-08-11 VITALS — BP 118/78 | HR 73 | Ht 61.0 in | Wt 162.6 lb

## 2016-08-11 DIAGNOSIS — Z813 Family history of other psychoactive substance abuse and dependence: Secondary | ICD-10-CM

## 2016-08-11 DIAGNOSIS — F419 Anxiety disorder, unspecified: Secondary | ICD-10-CM

## 2016-08-11 DIAGNOSIS — F200 Paranoid schizophrenia: Secondary | ICD-10-CM | POA: Diagnosis not present

## 2016-08-11 DIAGNOSIS — Z811 Family history of alcohol abuse and dependence: Secondary | ICD-10-CM

## 2016-08-11 MED ORDER — BENZTROPINE MESYLATE 1 MG PO TABS: 1.0000 mg | ORAL_TABLET | Freq: Every day | ORAL | 0 refills | Status: DC

## 2016-08-11 MED ORDER — QUETIAPINE FUMARATE 50 MG PO TABS: 50.0000 mg | ORAL_TABLET | Freq: Every day | ORAL | 0 refills | Status: DC

## 2016-08-11 MED ORDER — LORAZEPAM 0.5 MG PO TABS: 0.5000 mg | ORAL_TABLET | Freq: Two times a day (BID) | ORAL | 0 refills | Status: DC
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
## 2016-08-14 ENCOUNTER — Ambulatory Visit (INDEPENDENT_AMBULATORY_CARE_PROVIDER_SITE_OTHER): Payer: BLUE CROSS/BLUE SHIELD | Admitting: Clinical

## 2016-08-14 ENCOUNTER — Telehealth (HOSPITAL_COMMUNITY): Payer: Self-pay

## 2016-08-14 DIAGNOSIS — F2081 Schizophreniform disorder: Secondary | ICD-10-CM

## 2016-08-14 DIAGNOSIS — F200 Paranoid schizophrenia: Secondary | ICD-10-CM

## 2016-08-14 MED ORDER — GABAPENTIN 100 MG PO CAPS: 100.0000 mg | ORAL_CAPSULE | Freq: Three times a day (TID) | ORAL | 0 refills | Status: DC

## 2016-08-14 NOTE — Telephone Encounter (Signed)
She can continue Neurontin but not take Ativan more than her scheduled.

## 2016-08-14 NOTE — Progress Notes (Addendum)
   THERAPIST PROGRESS NOTE  Session Time: 10:06 - 10:59  Participation Level: Active  Behavioral Response: CasualAlertAnxious and Depressed  Type of Therapy: Individual Therapy  Treatment Goals addressed:  Treatment Goals addressed: Improve Psychiatric Symptoms, reduce psychosis and delusions, improve unhelpful thought patterns, elevate mood , learn about diagnosis, improve coping skills  Interventions: CBT and Motivational Interviewing  Summary: Charlotte Wise is a 41 y.o. female who presents with Schizophreniform disorder .   Suicidal/Homicidal: Nowithout intent/plan  Therapist Response: [name] met with clinician for an individual session. Thresia discussed her psychiatric symptoms and her current life events. She shared she has not been doing well. She shared that she had a lot of anxiety after her Neurontin was discontinued. She also shared that she has been having a lot on negative emotions related to the events that led to her being hospitalized. Client and clinician discussed her diagnosis and symptoms. Clinician asked open ended questions and Millie shared her perspective on the events. Clinician asked if there were other perspectives possible. Client and clinician discussed alternative perspectives. Client and clinician discussed how her thoughts, emotions and actions might change if she believed the healthier alternative perspectives.   Plan: Return again in 1-2 weeks.  Diagnosis: Axis I: Schizophreniform disorder         Keo Schirmer A, LCSW 08/14/2016

## 2016-08-14 NOTE — Telephone Encounter (Signed)
Called patient and let her know what Dr. Adele Schilder said, she needed a refill and that was sent to the phaemacy. Patient voiced her understanding.

## 2016-08-14 NOTE — Telephone Encounter (Signed)
Patient came in to see her therapist, she stopped to see me to talk about coming off of her Neurontin - she said that the day that she stopped it she had severe anxiety, wanting to call her husband home, etc. She said that she is taking the Seroquel and has had to take the Ativan more than usual. She did start back on the Neurontin=tin and would like to know if she can continue to take it.

## 2016-08-18 ENCOUNTER — Encounter (HOSPITAL_COMMUNITY): Payer: Self-pay | Admitting: Clinical

## 2016-08-19 ENCOUNTER — Telehealth (HOSPITAL_COMMUNITY): Payer: Self-pay

## 2016-08-19 NOTE — Telephone Encounter (Signed)
Patient called and she said despite going back on the Neurontin last week, she is still having bad anxiety. She states that she is anxious about being left alone, she is finding it hard to focus and do her daily activities. She would like a call back, thank you

## 2016-08-19 NOTE — Telephone Encounter (Signed)
I returned patient's phone call.  She's not comfortable with Seroquel.  She feels more nervous and anxious and confused.  I recommended to discontinue, she is already taking low dose 50 mg.  I recommended to go back on Zyprexa 7.5 mg which was working very well for her but she was not happy because of weight gain.  Patient admitted that she is rather take the Zyprexa and try to do to go exercise and watch her calorie intake because she cannot tolerate Seroquel.  She has refill remaining on Zyprexa 7.5 mg.  I recommended to call us back if she do not see any improvement after 1 week.

## 2016-08-20 NOTE — Telephone Encounter (Signed)
Okay, thank you!

## 2016-08-21 ENCOUNTER — Ambulatory Visit (INDEPENDENT_AMBULATORY_CARE_PROVIDER_SITE_OTHER): Payer: BLUE CROSS/BLUE SHIELD | Admitting: Clinical

## 2016-08-21 ENCOUNTER — Encounter (HOSPITAL_COMMUNITY): Payer: Self-pay | Admitting: Clinical

## 2016-08-21 DIAGNOSIS — F2081 Schizophreniform disorder: Secondary | ICD-10-CM

## 2016-08-21 NOTE — Progress Notes (Signed)
THERAPIST PROGRESS NOTE  Session Time: 10:00 - 10:55   Participation Level: Active  Behavioral Response: CasualAlertDepressed   Type of Therapy: Individual Therapy  Treatment Goals addressed: Improve Psychiatric Symptoms, reduce psychosis and delusions, improve unhelpful thought patterns, , elevate mood , learn about diagnosis, improve coping skills   Interventions: Motivational Interviewing, CBT, Grounding & Mindfulness Techniques, Psychoeducation  Summary: Charlotte Wise is a 41 y.o. female who presents with Schizophreniform disorder  Suicidal/Homicidal: No -without intent/plan  Therapist Response: Charlotte Wise met with clinician for an individual session. Charlotte Wise discussed her psychiatric symptoms and her current life events. Charlotte Wise shared that her medication was adjusted and she is feeling a little bit better. She shared that she had some good days and some difficult days. Clinician asked open ended questions and Charlotte Wise shared her thoughts and insights about her experiences. She shared about some thoughts related to her "event" that she was struggling with. Client and clinician discussed thoughts and perspectives. Clinician asked open ended questions and Charlotte Wise identified her thoughts and perspective on the event. She identified her emotions. She was then able to identify the evidence for and against the thoughts. Clinician asked clarifying questions. She was then able to formulate healthier alternative thoughts. She had the insight that she was responsible for making the choices to improve her mental health. Client and clinician discussed some techniques for disengaging from delusions and hallucinations.   She then shared her hindsight about when things started going off track.   Plan: Return again in 1-2 weeks  Diagnosis:     Axis I: Schizophreniform disorder    , A, LCSW 08/21/2016  

## 2016-08-21 NOTE — Progress Notes (Signed)
p 

## 2016-08-26 DIAGNOSIS — M9904 Segmental and somatic dysfunction of sacral region: Secondary | ICD-10-CM | POA: Diagnosis not present

## 2016-08-26 DIAGNOSIS — M791 Myalgia: Secondary | ICD-10-CM | POA: Diagnosis not present

## 2016-08-26 DIAGNOSIS — M9901 Segmental and somatic dysfunction of cervical region: Secondary | ICD-10-CM | POA: Diagnosis not present

## 2016-08-26 DIAGNOSIS — M9906 Segmental and somatic dysfunction of lower extremity: Secondary | ICD-10-CM | POA: Diagnosis not present

## 2016-08-27 ENCOUNTER — Telehealth (HOSPITAL_COMMUNITY): Payer: Self-pay

## 2016-08-27 DIAGNOSIS — F200 Paranoid schizophrenia: Secondary | ICD-10-CM

## 2016-08-27 NOTE — Telephone Encounter (Signed)
Medication management - Telephone call with patient to inform this nurse received her message as she is requesting a refill of Cogentin now that she has restarted Zyprexa.  Informed request would be sent to Dr. Adele Schilder and patient would be contacted back once a message discussed with MD.

## 2016-08-28 MED ORDER — BENZTROPINE MESYLATE 1 MG PO TABS: 1.0000 mg | ORAL_TABLET | Freq: Every day | ORAL | 0 refills | Status: DC

## 2016-08-28 NOTE — Telephone Encounter (Signed)
Medication management - Telephone call with patient to inform Dr. Adele Schilder sent in a one time 30 day refill order of her requested Cogentin and reminded of upcoming appointment set for 09/11/16.

## 2016-08-28 NOTE — Telephone Encounter (Signed)
Refill for Cogentin 1 mg for 30 days is given.

## 2016-08-29 DIAGNOSIS — Z1231 Encounter for screening mammogram for malignant neoplasm of breast: Secondary | ICD-10-CM | POA: Diagnosis not present

## 2016-09-03 ENCOUNTER — Ambulatory Visit (INDEPENDENT_AMBULATORY_CARE_PROVIDER_SITE_OTHER): Payer: BLUE CROSS/BLUE SHIELD | Admitting: Clinical

## 2016-09-03 ENCOUNTER — Encounter (HOSPITAL_COMMUNITY): Payer: Self-pay | Admitting: Clinical

## 2016-09-03 DIAGNOSIS — F2081 Schizophreniform disorder: Secondary | ICD-10-CM

## 2016-09-03 NOTE — Progress Notes (Signed)
   THERAPIST PROGRESS NOTE  Session Time: 8:05 - 8:57  Participation Level: Active  Behavioral Response: CasualAlertDepressed  Type of Therapy: Individual Therapy  Treatment Goals addressed: Improve Psychiatric Symptoms, reduce psychosis and delusions, improve unhelpful thought patterns, elevate mood, learn about diagnosis, improve coping skills   Interventions: Motivational Interviewing, CBT,   Summary: DEBRAANN LIVINGSTONE is a 41 y.o. female who presents with Schizophreniform disorder  Suicidal/Homicidal: No -without intent/plan  Therapist Response: Keyon met with clinician for an individual session. Meisha discussed her psychiatric symptoms and her current life events. Shrita shared that she has been equal amount of difficult days and  good days. Clinician asked open ended questions and Ariadna shared that the Zyprexa is doing better for her symptoms but that she is concerned about gaining weight. Clinician asked what her options were to help her maintain her weight. She identified portion eating, exercise and speaking to her psychiatrist about her medication. Client and clinician discussed these options and the addtional benefit to her mental health. Shaleka shared that she was experiencing some repetitive negative thoughts. Client and clinician used a 7 panel thought record sheet. Clinician asked open ended questions and Tianni identified the triggers, her negative emotions, her negative thoughts, the evidence for and against the negative thoughts. She was then able to formulate healthier alternative thoughts. She identified how her actions might change if her thoughts changed. Client and clinician discussed her diagnosis, delusions and ways to interrupt and change focus.  Client and clinician discussed Clinician's upcoming departure, Asiah's thoughts and emotions, and options for continued therapy.   Plan: Return again in 1-2 weeks  Diagnosis:     Axis I: Schizophreniform  disorder    Keria Widrig A, LCSW 09/03/2016

## 2016-09-04 ENCOUNTER — Ambulatory Visit: Payer: Self-pay | Admitting: Internal Medicine

## 2016-09-04 DIAGNOSIS — Z01419 Encounter for gynecological examination (general) (routine) without abnormal findings: Secondary | ICD-10-CM | POA: Diagnosis not present

## 2016-09-11 ENCOUNTER — Ambulatory Visit (HOSPITAL_COMMUNITY): Payer: Self-pay | Admitting: Psychiatry

## 2016-09-12 ENCOUNTER — Ambulatory Visit (INDEPENDENT_AMBULATORY_CARE_PROVIDER_SITE_OTHER): Payer: BLUE CROSS/BLUE SHIELD | Admitting: Psychiatry

## 2016-09-12 ENCOUNTER — Encounter (HOSPITAL_COMMUNITY): Payer: Self-pay | Admitting: Psychiatry

## 2016-09-12 VITALS — BP 120/74 | HR 67 | Ht 61.0 in | Wt 167.4 lb

## 2016-09-12 DIAGNOSIS — F200 Paranoid schizophrenia: Secondary | ICD-10-CM | POA: Diagnosis not present

## 2016-09-12 DIAGNOSIS — F419 Anxiety disorder, unspecified: Secondary | ICD-10-CM | POA: Diagnosis not present

## 2016-09-12 DIAGNOSIS — Z811 Family history of alcohol abuse and dependence: Secondary | ICD-10-CM

## 2016-09-12 DIAGNOSIS — F2081 Schizophreniform disorder: Secondary | ICD-10-CM

## 2016-09-12 DIAGNOSIS — Z813 Family history of other psychoactive substance abuse and dependence: Secondary | ICD-10-CM | POA: Diagnosis not present

## 2016-09-12 MED ORDER — BENZTROPINE MESYLATE 1 MG PO TABS: 1.0000 mg | ORAL_TABLET | Freq: Every day | ORAL | 1 refills | Status: DC

## 2016-09-12 MED ORDER — PHENTERMINE HCL 15 MG PO CAPS: 15.0000 mg | ORAL_CAPSULE | ORAL | 1 refills | Status: DC

## 2016-09-12 MED ORDER — OLANZAPINE 10 MG PO TABS: 10.0000 mg | ORAL_TABLET | Freq: Every day | ORAL | 1 refills | Status: DC

## 2016-09-12 MED ORDER — GABAPENTIN 100 MG PO CAPS: 100.0000 mg | ORAL_CAPSULE | Freq: Three times a day (TID) | ORAL | 1 refills | Status: DC
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
## 2016-09-16 DIAGNOSIS — M791 Myalgia: Secondary | ICD-10-CM | POA: Diagnosis not present

## 2016-09-16 DIAGNOSIS — M9906 Segmental and somatic dysfunction of lower extremity: Secondary | ICD-10-CM | POA: Diagnosis not present

## 2016-09-16 DIAGNOSIS — M9901 Segmental and somatic dysfunction of cervical region: Secondary | ICD-10-CM | POA: Diagnosis not present

## 2016-09-18 ENCOUNTER — Encounter (HOSPITAL_COMMUNITY): Payer: Self-pay | Admitting: Clinical

## 2016-09-18 ENCOUNTER — Ambulatory Visit (INDEPENDENT_AMBULATORY_CARE_PROVIDER_SITE_OTHER): Payer: BLUE CROSS/BLUE SHIELD | Admitting: Clinical

## 2016-09-18 DIAGNOSIS — F2081 Schizophreniform disorder: Secondary | ICD-10-CM | POA: Diagnosis not present

## 2016-09-18 NOTE — Progress Notes (Signed)
   THERAPIST PROGRESS NOTE  Session Time: 9:02- 10:00  Participation Level: Active  Behavioral Response: CasualAlertNA  Type of Therapy: Individual Therapy  Treatment Goals addressed: Improve Psychiatric Symptoms,  improve unhelpful thought patterns, elevate mood,  coping skills   Interventions: Motivational Interviewing, CBT,   Summary: Charlotte Wise is a 41 y.o. female who presents with Schizophreniform disorder  Suicidal/Homicidal: No -without intent/plan  Therapist Response: [name] met with clinician for an individual session. [name] discussed her psychiatric symptoms and her current life events. [name] shared that she has been doing well mostly. Clinician asked open ended questions.She shared that she feels good with her medication now that she has something to suppress her appetite. The Zyprexa was causing her to feel hungry all the time. She shared that she did experience some panic and anxiety. Clinician asked open ended questions and [name] shared that the panic and anxiety revolved around her getting ready for a trip to see her family in [place]. Clinician asked open ended questions and [name] shared that she had a need for everything to be done in a particular way or her anxiety goes up. Client and clinician discussed how our thoughts affect our emotions. Client and clinician discussed how to challenge and change unhelpful thoughts. Clinician shared with [name] where she could get packets on perfectionism for free.  [name] and clinician discussed clinicians upcoming departure. [name] shared her thoughts and concerns. Client and clinician reviewed her options for continuing therapy with another clinician.  Plan: Return again in 2 weeks  Diagnosis:Axis I: Schizophreniform disorder    Alanson Hausmann A, LCSW 09/18/2016

## 2016-09-23 ENCOUNTER — Ambulatory Visit (HOSPITAL_COMMUNITY): Payer: Self-pay | Admitting: Clinical

## 2016-09-25 ENCOUNTER — Ambulatory Visit: Payer: Self-pay | Admitting: Internal Medicine

## 2016-09-30 ENCOUNTER — Ambulatory Visit (HOSPITAL_COMMUNITY): Payer: Self-pay | Admitting: Clinical

## 2016-10-07 ENCOUNTER — Ambulatory Visit (HOSPITAL_COMMUNITY): Payer: Self-pay | Admitting: Clinical

## 2016-10-09 ENCOUNTER — Ambulatory Visit (INDEPENDENT_AMBULATORY_CARE_PROVIDER_SITE_OTHER): Payer: BLUE CROSS/BLUE SHIELD | Admitting: Internal Medicine

## 2016-10-09 ENCOUNTER — Encounter: Payer: Self-pay | Admitting: Internal Medicine

## 2016-10-09 VITALS — BP 122/84 | HR 81 | Temp 97.7°F | Resp 16 | Ht 61.0 in | Wt 168.0 lb

## 2016-10-09 DIAGNOSIS — E782 Mixed hyperlipidemia: Secondary | ICD-10-CM

## 2016-10-09 DIAGNOSIS — E661 Drug-induced obesity: Secondary | ICD-10-CM

## 2016-10-09 DIAGNOSIS — F200 Paranoid schizophrenia: Secondary | ICD-10-CM

## 2016-10-09 DIAGNOSIS — Z6830 Body mass index (BMI) 30.0-30.9, adult: Secondary | ICD-10-CM | POA: Diagnosis not present

## 2016-10-09 DIAGNOSIS — Z79899 Other long term (current) drug therapy: Secondary | ICD-10-CM | POA: Diagnosis not present

## 2016-10-09 DIAGNOSIS — R7303 Prediabetes: Secondary | ICD-10-CM | POA: Diagnosis not present

## 2016-10-09 DIAGNOSIS — E559 Vitamin D deficiency, unspecified: Secondary | ICD-10-CM | POA: Diagnosis not present

## 2016-10-09 DIAGNOSIS — E039 Hypothyroidism, unspecified: Secondary | ICD-10-CM | POA: Diagnosis not present

## 2016-10-09 DIAGNOSIS — Z136 Encounter for screening for cardiovascular disorders: Secondary | ICD-10-CM

## 2016-10-09 LAB — CBC WITH DIFFERENTIAL/PLATELET
Basophils Absolute: 73 cells/uL (ref 0–200)
Basophils Relative: 1 %
Eosinophils Absolute: 219 cells/uL (ref 15–500)
Eosinophils Relative: 3 %
HCT: 38.8 % (ref 35.0–45.0)
Hemoglobin: 12.9 g/dL (ref 11.7–15.5)
Lymphocytes Relative: 21 %
Lymphs Abs: 1533 cells/uL (ref 850–3900)
MCH: 32.6 pg (ref 27.0–33.0)
MCHC: 33.2 g/dL (ref 32.0–36.0)
MCV: 98 fL (ref 80.0–100.0)
MPV: 8.6 fL (ref 7.5–12.5)
Monocytes Absolute: 803 cells/uL (ref 200–950)
Monocytes Relative: 11 %
Neutro Abs: 4672 cells/uL (ref 1500–7800)
Neutrophils Relative %: 64 %
Platelets: 353 10*3/uL (ref 140–400)
RBC: 3.96 MIL/uL (ref 3.80–5.10)
RDW: 13.7 % (ref 11.0–15.0)
WBC: 7.3 10*3/uL (ref 3.8–10.8)

## 2016-10-09 MED ORDER — PHENTERMINE HCL 37.5 MG PO TABS: ORAL_TABLET | ORAL | 5 refills | Status: DC

## 2016-10-09 NOTE — Patient Instructions (Signed)

## 2016-10-09 NOTE — Progress Notes (Signed)
[facility]   Unk Pinto, M.D.      Uvaldo Bristle. Silverio Lay, P.A.-[facility]                [address] [place], [place] [address] Telephone [phone] Telefax [phone]  Subjective:    Patient ID: Charlotte Wise, female    DOB: 1976-01-29, 41 y.o.   MRN: [id number]  HPI  This very nice 41 yo MWF w/ Hypothyroidism predating to her 34's - treatment started in the 1990's about 20 yrs ago presents for evaluation. Patient also has hx/o Paranoid Schizophrenia and has been treated with atypical Antipsychotics (Zyprexia) and has had weight gain. Patient denies k/o elevated BP's, dyslipidemia and she is monitored closely for Glucose intolerance.   Medication Sig  . benztropine (COGENTIN) 1 MG tablet Take 1 tablet (1 mg total) by mouth daily.  . calcium acetate (PHOSLO) 667 MG capsule Take by mouth 3 (three) times daily with meals.  . cholecalciferol (VITAMIN D) 1000 units tablet Take 1,000 Units by mouth daily.  Marland Kitchen gabapentin (NEURONTIN) 100 MG capsule Take 1 capsule (100 mg total) by mouth 3 (three) times daily.  Marland Kitchen levothyroxine (SYNTHROID, LEVOTHROID) 75 MCG tablet Take 1 tablet (75 mcg total) by mouth daily before breakfast.  . magnesium 30 MG tablet Take 30 mg by mouth 2 (two) times daily.  . Multiple Vitamin (MULTIVITAMIN WITH MINERALS) TABS tablet Take 1 tablet by mouth daily.  Marland Kitchen OLANZapine (ZYPREXA) 10 MG tablet Take 1 tablet (10 mg total) by mouth at bedtime.  . Omega-3 Fatty Acids (FISH OIL) 1000 MG CPDR Take by mouth.  . phentermine 15 MG capsule Take 1 capsule (15 mg total) by mouth every morning.  . Turmeric 450 MG CAPS Take by mouth.   Allergies  Allergen Reactions  . Hydrocodone Itching  . Penicillins Swelling   Past Medical History:  Diagnosis Date  . Allergy   . Anxiety   . Arthritis   . Major depressive disorder, 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[place]) 04/15/2016  . Thyroid  disease    Past Surgical History:  Procedure Laterality Date  . ANKLE ARTHROSCOPY Right 2000  . KNEE ARTHROSCOPY Left 1988  . KNEE ARTHROSCOPY Right 1999  . LAPAROSCOPY  2001  . NASAL POLYP SURGERY  2006  . TONSILLECTOMY AND ADENOIDECTOMY  1981   Review of Systems  10 point systems review negative except as above.    Objective:   Physical Exam  BP 122/84   Pulse 81   Temp 97.7 F (36.5 C)   Resp 16   Ht 5\' 1"  (1.549 m)   Wt 168 lb (76.2 kg)   BMI 31.74 kg/m   HEENT - Eac's patent. TM's Nl. EOM's full. PERRLA. NasoOroPharynx clear. Neck - supple. Nl Thyroid. Carotids 2+ & No bruits, nodes, JVD Chest - Clear equal BS w/o Rales, rhonchi, wheezes. Cor - Nl HS. RRR w/o sig MGR. PP 1(+). No edema. Abd - No palpable organomegaly, masses or tenderness. BS nl. MS- FROM w/o deformities. Muscle power, tone and bulk Nl. Gait Nl. Neuro - No obvious Cr N abnormalities. Sensory, motor and Cerebellar functions appear Nl w/o focal abnormalities. Psyche - Mental status normal & appropriate.  No delusions, ideations, SI, HI or obvious mood abnormalities.    Assessment & Plan:   1. Hypertension screen  - CBC with Differential/Platelet - BASIC  METABOLIC PANEL WITH GFR - Magnesium - TSH  2. Mixed hyperlipidemia  - Hepatic function panel - Lipid panel - TSH  3. Prediabetes  - Hemoglobin A1c - Insulin, random  4. Vitamin D deficiency  - VITAMIN D 25 Hydroxy   5. Hypothyroidism  - TSH  6. Medication management  - CBC with Differential/Platelet - BASIC METABOLIC PANEL WITH GFR - Hepatic function panel - Magnesium - Lipid panel - TSH - Hemoglobin A1c - VITAMIN D 25 Hydroxy  - Insulin, random

## 2016-10-10 LAB — BASIC METABOLIC PANEL WITH GFR
BUN: 18 mg/dL (ref 7–25)
CO2: 23 mmol/L (ref 20–31)
Calcium: 9.7 mg/dL (ref 8.6–10.2)
Chloride: 102 mmol/L (ref 98–110)
Creat: 0.97 mg/dL (ref 0.50–1.10)
GFR, Est African American: 83 mL/min (ref >=60)
GFR, Est Non African American: 72 mL/min (ref >=60)
Glucose, Bld: 97 mg/dL (ref 65–99)
Potassium: 5.1 mmol/L (ref 3.5–5.3)
Sodium: 139 mmol/L (ref 135–146)

## 2016-10-10 LAB — MAGNESIUM: Magnesium: 2.1 mg/dL (ref 1.5–2.5)

## 2016-10-10 LAB — HEPATIC FUNCTION PANEL
ALT: 31 U/L — ABNORMAL HIGH (ref 6–29)
AST: 22 U/L (ref 10–30)
Albumin: 4.2 g/dL (ref 3.6–5.1)
Alkaline Phosphatase: 55 U/L (ref 33–115)
Bilirubin, Direct: 0.1 mg/dL (ref <=0.2)
Indirect Bilirubin: 0.4 mg/dL (ref 0.2–1.2)
Total Bilirubin: 0.5 mg/dL (ref 0.2–1.2)
Total Protein: 6.9 g/dL (ref 6.1–8.1)

## 2016-10-10 LAB — LIPID PANEL
Cholesterol: 178 mg/dL (ref <200)
HDL: 54 mg/dL (ref >50)
LDL Cholesterol: 98 mg/dL (ref <100)
Total CHOL/HDL Ratio: 3.3 Ratio (ref <5.0)
Triglycerides: 128 mg/dL (ref <150)
VLDL: 26 mg/dL (ref <30)

## 2016-10-10 LAB — HEMOGLOBIN A1C
Hgb A1c MFr Bld: 5.1 % (ref <5.7)
Mean Plasma Glucose: 100 mg/dL

## 2016-10-10 LAB — VITAMIN D 25 HYDROXY (VIT D DEFICIENCY, FRACTURES): Vit D, 25-Hydroxy: 40 ng/mL (ref 30–100)

## 2016-10-10 LAB — TSH: TSH: 2.77 mIU/L

## 2016-10-10 LAB — INSULIN, RANDOM: Insulin: 11.9 u[IU]/mL (ref 2.0–19.6)

## 2016-10-14 DIAGNOSIS — M791 Myalgia: Secondary | ICD-10-CM | POA: Diagnosis not present

## 2016-10-14 DIAGNOSIS — M9906 Segmental and somatic dysfunction of lower extremity: Secondary | ICD-10-CM | POA: Diagnosis not present

## 2016-10-14 DIAGNOSIS — M9901 Segmental and somatic dysfunction of cervical region: Secondary | ICD-10-CM | POA: Diagnosis not present

## 2016-11-13 ENCOUNTER — Encounter (HOSPITAL_COMMUNITY): Payer: Self-pay | Admitting: Psychiatry

## 2016-11-13 ENCOUNTER — Ambulatory Visit (INDEPENDENT_AMBULATORY_CARE_PROVIDER_SITE_OTHER): Payer: BLUE CROSS/BLUE SHIELD | Admitting: Psychiatry

## 2016-11-13 VITALS — BP 128/74 | HR 88 | Ht 61.0 in | Wt 172.2 lb

## 2016-11-13 DIAGNOSIS — M549 Dorsalgia, unspecified: Secondary | ICD-10-CM

## 2016-11-13 DIAGNOSIS — F200 Paranoid schizophrenia: Secondary | ICD-10-CM | POA: Diagnosis not present

## 2016-11-13 DIAGNOSIS — Z811 Family history of alcohol abuse and dependence: Secondary | ICD-10-CM | POA: Diagnosis not present

## 2016-11-13 DIAGNOSIS — R635 Abnormal weight gain: Secondary | ICD-10-CM

## 2016-11-13 DIAGNOSIS — F419 Anxiety disorder, unspecified: Secondary | ICD-10-CM | POA: Diagnosis not present

## 2016-11-13 DIAGNOSIS — R443 Hallucinations, unspecified: Secondary | ICD-10-CM | POA: Diagnosis not present

## 2016-11-13 DIAGNOSIS — Z813 Family history of other psychoactive substance abuse and dependence: Secondary | ICD-10-CM
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
## 2016-11-27 DIAGNOSIS — N83292 Other ovarian cyst, left side: Secondary | ICD-10-CM | POA: Diagnosis not present

## 2016-11-27 DIAGNOSIS — R1032 Left lower quadrant pain: Secondary | ICD-10-CM | POA: Diagnosis not present

## 2016-12-02 ENCOUNTER — Telehealth (HOSPITAL_COMMUNITY): Payer: Self-pay

## 2016-12-02 ENCOUNTER — Other Ambulatory Visit (HOSPITAL_COMMUNITY): Payer: Self-pay | Admitting: Psychiatry

## 2016-12-02 DIAGNOSIS — F200 Paranoid schizophrenia: Secondary | ICD-10-CM

## 2016-12-02 MED ORDER — GABAPENTIN 100 MG PO CAPS: 100.0000 mg | ORAL_CAPSULE | Freq: Three times a day (TID) | ORAL | 0 refills | Status: DC

## 2016-12-02 MED ORDER — BENZTROPINE MESYLATE 1 MG PO TABS: 1.0000 mg | ORAL_TABLET | Freq: Every day | ORAL | 0 refills | Status: DC

## 2016-12-02 NOTE — Telephone Encounter (Signed)
Patient called and said that she is completely off of the Zyprexa. She is now coming off of the cogentin and then the gabapentin. She said that she is having withdrawal symptoms, so she is coming off slowly and would like to know if you can provide one more month of the Gabapentin and the Cogentin. Please review and advise, thank you

## 2016-12-02 NOTE — Telephone Encounter (Signed)
I called in a one month order and called patient to let her know

## 2016-12-02 NOTE — Telephone Encounter (Signed)
She may be decompensating due to noncompliance with Zyprexa.  If she started to have paranoia and then she should go back to low dose Zyprexa.  We will provide gabapentin for one more month.

## 2016-12-04 ENCOUNTER — Ambulatory Visit (INDEPENDENT_AMBULATORY_CARE_PROVIDER_SITE_OTHER): Payer: BLUE CROSS/BLUE SHIELD | Admitting: Internal Medicine

## 2016-12-04 VITALS — BP 126/72 | HR 68 | Temp 97.9°F | Resp 18 | Ht 61.0 in | Wt 168.2 lb

## 2016-12-04 DIAGNOSIS — G4700 Insomnia, unspecified: Secondary | ICD-10-CM

## 2016-12-04 DIAGNOSIS — E039 Hypothyroidism, unspecified: Secondary | ICD-10-CM | POA: Diagnosis not present

## 2016-12-04 DIAGNOSIS — F2081 Schizophreniform disorder: Secondary | ICD-10-CM

## 2016-12-04 MED ORDER — MIRTAZAPINE 30 MG PO TABS: ORAL_TABLET | ORAL | 2 refills | Status: DC

## 2016-12-04 NOTE — Progress Notes (Signed)
   Subjective:    Patient ID: Charlotte Wise, female    DOB: 1975-05-02, 41 y.o.   MRN: [id number]  HPI  This 41 yo MWF w/hx/o Hypothyroidism predating circa 1990's and hx/o Paranoid Schizophrenia was seen recently by her Psyche Dr Berniece Andreas for medication review. Apparently patient is med resistant and has tapered off of her Zyprexa and intends to taper off of her Benztropine. She denies any delusions or hallucinations since off of the Zyprexa. She continues on Gabapentin which she's taking for neck and LBP. She does report poor sleep hygiene and requests a med for sleep. Denies SI/HI.   Medication Sig  . benztropine  1 MG tablet Take 1 tab daily.  . calcium acetate (PHOSLO) 667 MG capsule Take  3  x daily with meals.  Marland Kitchen VITAMIN D 1000 units  Take 1,000 Units by mouth daily.  Marland Kitchen gabapentin  100 MG cap Take 1 cap 3 x daily.  Marland Kitchen levothyroxine  75 MCG tab Take 1 tab daily before breakfast.  . magnesium 30 MG tablet Take 30 mg  2 x daily.  . Multiple Vitamin w/ Min Take 1 tab daily.  . Omega-3 FISH OIL 1000 MG  Take daily.  . phentermine  37.5 MG tablet Take 1/2 to 1 tab every morning  . Turmeric 450 MG CAPS Take daily  . OLANZapine (Zyprexa) 10 MG  Patient not taking: Reported on 12/04/2016   Allergies  Allergen Reactions  . Hydrocodone Itching  . Oxycodone-Acetaminophen   . Penicillins Swelling   Past Medical History:  Diagnosis Date  . Allergy   . Anxiety   . Arthritis   . Major depressive disorder, 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[place]) 04/15/2016  . Thyroid disease    Past Surgical History:  Procedure Laterality Date  . ANKLE ARTHROSCOPY Right 2000  . KNEE ARTHROSCOPY Left 1988  . KNEE ARTHROSCOPY Right 1999  . LAPAROSCOPY  2001  . NASAL POLYP SURGERY  2006  . TONSILLECTOMY AND ADENOIDECTOMY  1981   Review of Systems  10 point systems review negative except as above.    Objective:   Physical Exam  BP 126/72   Pulse 68   Temp 97.9 F (36.6 C)   Resp 18   Ht  5\' 1"  (1.549 m)   Wt 168 lb 3.2 oz (76.3 kg)   BMI 31.78 kg/m   HEENT - WNL. Neck - supple.  Chest - Clear equal BS. Cor - Nl HS. RRR w/o sig MGR. PP 1(+). No edema. MS- FROM w/o deformities.  Gait Nl. Neuro -  Nl w/o focal abnormalities.    Assessment & Plan:   1. Schizophreniform disorder ([place])   2. Insomnia  - mirtazapine (REMERON) 30 MG tablet; Take 1/2 to 1 tablet 1 hour before sleep  Dispense: 30 tablet; Refill: 2  3. Hypothyroidism  -  discussed meds & SE's

## 2017-01-20 ENCOUNTER — Other Ambulatory Visit: Payer: Self-pay | Admitting: Internal Medicine

## 2017-01-20 ENCOUNTER — Telehealth: Payer: Self-pay | Admitting: *Deleted

## 2017-01-20 NOTE — Telephone Encounter (Signed)
A message was left to inform the patient to get her Gabapentin refill from Dr Adele Schilder.

## 2017-01-26 DIAGNOSIS — H35363 Drusen (degenerative) of macula, bilateral: Secondary | ICD-10-CM | POA: Diagnosis not present

## 2017-02-26 DIAGNOSIS — R1032 Left lower quadrant pain: Secondary | ICD-10-CM | POA: Diagnosis not present

## 2017-02-26 DIAGNOSIS — N83292 Other ovarian cyst, left side: Secondary | ICD-10-CM | POA: Diagnosis not present

## 2017-03-30 ENCOUNTER — Other Ambulatory Visit: Payer: Self-pay

## 2017-03-30 MED ORDER — LEVOTHYROXINE SODIUM 75 MCG PO TABS: 75.0000 ug | ORAL_TABLET | Freq: Every day | ORAL | 0 refills | Status: DC

## 2017-03-31 DIAGNOSIS — M5032 Other cervical disc degeneration, mid-cervical region, unspecified level: Secondary | ICD-10-CM | POA: Diagnosis not present

## 2017-03-31 DIAGNOSIS — M47812 Spondylosis without myelopathy or radiculopathy, cervical region: Secondary | ICD-10-CM | POA: Diagnosis not present

## 2017-03-31 DIAGNOSIS — M9903 Segmental and somatic dysfunction of lumbar region: Secondary | ICD-10-CM | POA: Diagnosis not present

## 2017-03-31 DIAGNOSIS — M9905 Segmental and somatic dysfunction of pelvic region: Secondary | ICD-10-CM | POA: Diagnosis not present

## 2017-04-01 DIAGNOSIS — M5032 Other cervical disc degeneration, mid-cervical region, unspecified level: Secondary | ICD-10-CM | POA: Diagnosis not present

## 2017-04-01 DIAGNOSIS — M47812 Spondylosis without myelopathy or radiculopathy, cervical region: Secondary | ICD-10-CM | POA: Diagnosis not present

## 2017-04-01 DIAGNOSIS — M9903 Segmental and somatic dysfunction of lumbar region: Secondary | ICD-10-CM | POA: Diagnosis not present

## 2017-04-01 DIAGNOSIS — M9905 Segmental and somatic dysfunction of pelvic region: Secondary | ICD-10-CM | POA: Diagnosis not present

## 2017-04-07 DIAGNOSIS — M47812 Spondylosis without myelopathy or radiculopathy, cervical region: Secondary | ICD-10-CM | POA: Diagnosis not present

## 2017-04-07 DIAGNOSIS — M5032 Other cervical disc degeneration, mid-cervical region, unspecified level: Secondary | ICD-10-CM | POA: Diagnosis not present

## 2017-04-07 DIAGNOSIS — M9905 Segmental and somatic dysfunction of pelvic region: Secondary | ICD-10-CM | POA: Diagnosis not present

## 2017-04-07 DIAGNOSIS — M9903 Segmental and somatic dysfunction of lumbar region: Secondary | ICD-10-CM | POA: Diagnosis not present

## 2017-04-21 ENCOUNTER — Encounter: Payer: Self-pay | Admitting: Internal Medicine

## 2017-05-05 ENCOUNTER — Encounter: Payer: Self-pay | Admitting: Internal Medicine

## 2017-05-06 DIAGNOSIS — E039 Hypothyroidism, unspecified: Secondary | ICD-10-CM | POA: Insufficient documentation

## 2017-05-06 DIAGNOSIS — E669 Obesity, unspecified: Secondary | ICD-10-CM | POA: Insufficient documentation

## 2017-05-06 DIAGNOSIS — E559 Vitamin D deficiency, unspecified: Secondary | ICD-10-CM | POA: Insufficient documentation

## 2017-05-06 DIAGNOSIS — Z6824 Body mass index (BMI) 24.0-24.9, adult: Secondary | ICD-10-CM | POA: Insufficient documentation

## 2017-05-06 NOTE — Progress Notes (Signed)
Complete Physical  Assessment and Plan:  Diagnoses and all orders for this visit:  Encounter for general adult medical examination with abnormal findings  Schizophreniform disorder [facility]) Previously managed by Dr. Stephanie Acre; reports resolved and doing well off of medications  Hypothyroidism, unspecified type continue medications the same pending lab results reminded to take on an empty stomach 30-73mins before food.  -     TSH  Vitamin D deficiency Continue with supplementation for goal of 70-100 -     VITAMIN D 25 Hydroxy (Vit-D Deficiency, Fractures)  Obesity (BMI 30.0-34.9) Long discussion about weight loss, diet, and exercise Recommended diet heavy in fruits and veggies and low in animal meats, cheeses, and dairy products, appropriate calorie intake Discussed appropriate weight for height and goal (130 lb) Follow up at next visit  Screening cholesterol level -     Lipid panel  Screening for cardiovascular condition -     EKG 12-Lead  Screening for diabetes mellitus -     Hemoglobin A1c  Screening for hematuria or proteinuria -     Microalbumin / creatinine urine ratio  Medication management -     CBC with Differential/Platelet -     BASIC METABOLIC PANEL WITH GFR -     Hepatic function panel -     Urinalysis w microscopic + reflex cultur  Screening for deficiency anemia -     Vitamin B12  Discussed med's effects and SE's. Screening labs and tests as requested with regular follow-up as recommended. Over 40 minutes of exam, counseling, chart review, and complex, high level critical decision making was performed this visit.   Future Appointments  Date Time Provider [place]  11/04/2017  8:45 AM Liane Comber, NP [facility] None  05/10/2018  9:00 AM Liane Comber, NP [facility] None    HPI  42 y.o. female  presents for a complete physical and follow up for has Hypothyroid; Vitamin D deficiency; Obesity (BMI 30.0-34.9); and Early dry stage  nonexudative age-related macular degeneration of both eyes on their problem list. She was previously followed by Dr. Stephanie Acre for schizophrenia, and was seeing [name] (counsellor) regularly as well, but reports she has tapered off of medications and is doing well. Followed annually by GYN for mammograms and PAP smears.   she is prescribed phentermine for weight loss, but has not taken in several months. Plans to restart soon.   They deny palpitations, anxiety, trouble sleeping, elevated BP.   BMI is Body mass index is 35.14 kg/m., she is working on diet and exercise. She walks most days, does yoga. She does watch her diet, has lost weight previously, plans to get down to 130 lb over the next year or so.  Wt Readings from Last 3 Encounters:  05/07/17 186 lb (84.4 kg)  12/04/16 168 lb 3.2 oz (76.3 kg)  10/09/16 168 lb (76.2 kg)   Her blood pressure has been controlled at home (110/70), today their BP is BP: 136/82 She does workout. She denies chest pain, shortness of breath, dizziness.   She is not on cholesterol medication. Her cholesterol is at goal. The cholesterol last visit was:   Lab Results  Component Value Date   CHOL 178 10/09/2016   HDL 54 10/09/2016   LDLCALC 98 10/09/2016   TRIG 128 10/09/2016   CHOLHDL 3.3 10/09/2016    Last A1C in the office was:  Lab Results  Component Value Date   HGBA1C 5.1 10/09/2016   She is on thyroid medication. Her medication  was not changed last visit.   Lab Results  Component Value Date   TSH 2.77 10/09/2016   Last GFR: Lab Results  Component Value Date   GFRNONAA 72 10/09/2016   Patient is on Vitamin D supplement but remains below goal of 70:    Lab Results  Component Value Date   VD25OH 40 10/09/2016      Current Medications:  Current Outpatient Medications on File Prior to Visit  Medication Sig Dispense Refill  . cholecalciferol (VITAMIN D) 1000 units tablet Take 1,000 Units by mouth daily.    Marland Kitchen levothyroxine  (SYNTHROID, LEVOTHROID) 75 MCG tablet Take 1 tablet (75 mcg total) by mouth daily before breakfast. 90 tablet 0  . magnesium 30 MG tablet Take 30 mg by mouth 2 (two) times daily.    . Multiple Vitamin (MULTIVITAMIN WITH MINERALS) TABS tablet Take 1 tablet by mouth daily.    Marland Kitchen OLANZapine (ZYPREXA) 10 MG tablet Take 1 tablet (10 mg total) by mouth at bedtime. 30 tablet 1  . Omega-3 Fatty Acids (FISH OIL) 1000 MG CPDR Take by mouth.    . Turmeric 450 MG CAPS Take by mouth.    . calcium acetate (PHOSLO) 667 MG capsule Take by mouth 3 (three) times daily with meals.    . phentermine (ADIPEX-P) 37.5 MG tablet Take 1/2 to 1 tablet every morning for dieting & weightloss (Patient not taking: Reported on 05/07/2017) 30 tablet 5   No current facility-administered medications on file prior to visit.    Allergies:  Allergies  Allergen Reactions  . Hydrocodone Itching  . Oxycodone-Acetaminophen   . Penicillins Swelling   Medical History:  She has Hypothyroid; Vitamin D deficiency; Obesity (BMI 30.0-34.9); and Early dry stage nonexudative age-related macular degeneration of both eyes on their problem list. Health Maintenance:   Immunization History  Administered Date(s) Administered  . Influenza,inj,quad, With Preservative 01/17/2016   Tetanus: -  Flu vaccine: 2017, declines  LMP: No LMP recorded. Patient is perimenopausal. Pap:    By GYN - 07/2015 MGM:  By GYN - need reports- requested  Head CT: 04/18/2016  Last Dental Exam: 02/2017- goes q6 Last Eye Exam: Annually, has macular degeneration   Patient Care Team: Unk Pinto, MD as PCP - General (Internal Medicine)  Surgical History:  She has a past surgical history that includes Tonsillectomy and adenoidectomy (1981); Knee arthroscopy (Left, 1988); Knee arthroscopy (Right, 1999); Ankle arthroscopy (Right, 2000); laparoscopy (2001); and Nasal polyp surgery (2006). Family History:  Herfamily history includes Alcohol abuse in her brother,  father, and mother; Clotting disorder in her mother; Drug abuse in her brother, father, and mother; Heart disease in her brother and paternal grandfather; Kidney disease in her maternal grandfather; Stroke in her paternal grandfather; Thyroid cancer in her maternal grandmother. Social History:  She reports that  has never smoked. she has never used smokeless tobacco. She reports that she drinks alcohol. She reports that she does not use drugs.  Review of Systems: Review of Systems  Constitutional: Negative for malaise/fatigue and weight loss.  HENT: Negative for hearing loss and tinnitus.   Eyes: Negative for blurred vision and double vision.  Respiratory: Negative for cough, shortness of breath and wheezing.   Cardiovascular: Negative for chest pain, palpitations, orthopnea, claudication and leg swelling.  Gastrointestinal: Negative for abdominal pain, blood in stool, constipation, diarrhea, heartburn, melena, nausea and vomiting.  Genitourinary: Negative.   Musculoskeletal: Negative for joint pain and myalgias.  Skin: Negative for rash.  Neurological: Negative for  dizziness, tingling, sensory change, weakness and headaches.  Endo/Heme/Allergies: Negative for environmental allergies and polydipsia.  Psychiatric/Behavioral: Negative for depression, hallucinations, memory loss, substance abuse and suicidal ideas. The patient has insomnia (Intermittent). The patient is not nervous/anxious.   All other systems reviewed and are negative.   Physical Exam: Estimated body mass index is 35.14 kg/m as calculated from the following:   Height as of this encounter: 5\' 1"  (1.549 m).   Weight as of this encounter: 186 lb (84.4 kg). BP 136/82   Pulse 83   Temp 98.2 F (36.8 C)   Ht 5\' 1"  (1.549 m)   Wt 186 lb (84.4 kg)   SpO2 99%   BMI 35.14 kg/m  General Appearance: Well nourished, in no apparent distress.  Eyes: PERRLA, EOMs, conjunctiva no swelling or erythema, normal fundi and vessels.   Sinuses: No Frontal/maxillary tenderness  ENT/Mouth: Ext aud canals clear, normal light reflex with TMs without erythema, bulging. Good dentition. No erythema, swelling, or exudate on post pharynx. Tonsils not swollen or erythematous. Hearing normal.  Neck: Supple, thyroid normal. No bruits  Respiratory: Respiratory effort normal, BS equal bilaterally without rales, rhonchi, wheezing or stridor.  Cardio: RRR without murmurs, rubs or gallops. Brisk peripheral pulses without edema.  Chest: symmetric, with normal excursions and percussion.  Breasts: Symmetric, without lumps, nipple discharge, retractions.  Abdomen: Soft, nontender, no guarding, rebound, hernias, masses, or organomegaly.  Lymphatics: Non tender without lymphadenopathy.  Genitourinary: Defer to GYN Musculoskeletal: Full ROM all peripheral extremities,5/5 strength, and normal gait.  Skin: Warm, dry without rashes, lesions, ecchymosis. Neuro: Cranial nerves intact, reflexes equal bilaterally. Normal muscle tone, no cerebellar symptoms. Sensation intact.  Psych: Awake and oriented X 3, normal affect, Insight and Judgment appropriate.   EKG: WNL no ST changes.  Gorden Harms Fernande Treiber 10:24 AM [facility]

## 2017-05-07 ENCOUNTER — Encounter: Payer: Self-pay | Admitting: Physician Assistant

## 2017-05-07 ENCOUNTER — Encounter: Payer: Self-pay | Admitting: Adult Health

## 2017-05-07 ENCOUNTER — Ambulatory Visit (INDEPENDENT_AMBULATORY_CARE_PROVIDER_SITE_OTHER): Payer: BLUE CROSS/BLUE SHIELD | Admitting: Adult Health

## 2017-05-07 VITALS — BP 136/82 | HR 83 | Temp 98.2°F | Ht 61.0 in | Wt 186.0 lb

## 2017-05-07 DIAGNOSIS — Z0001 Encounter for general adult medical examination with abnormal findings: Secondary | ICD-10-CM

## 2017-05-07 DIAGNOSIS — E559 Vitamin D deficiency, unspecified: Secondary | ICD-10-CM

## 2017-05-07 DIAGNOSIS — Z1389 Encounter for screening for other disorder: Secondary | ICD-10-CM | POA: Diagnosis not present

## 2017-05-07 DIAGNOSIS — Z79899 Other long term (current) drug therapy: Secondary | ICD-10-CM

## 2017-05-07 DIAGNOSIS — Z0000 Encounter for general adult medical examination without abnormal findings: Secondary | ICD-10-CM

## 2017-05-07 DIAGNOSIS — I10 Essential (primary) hypertension: Secondary | ICD-10-CM

## 2017-05-07 DIAGNOSIS — H353131 Nonexudative age-related macular degeneration, bilateral, early dry stage: Secondary | ICD-10-CM | POA: Insufficient documentation

## 2017-05-07 DIAGNOSIS — Z13220 Encounter for screening for lipoid disorders: Secondary | ICD-10-CM

## 2017-05-07 DIAGNOSIS — Z136 Encounter for screening for cardiovascular disorders: Secondary | ICD-10-CM | POA: Diagnosis not present

## 2017-05-07 DIAGNOSIS — Z131 Encounter for screening for diabetes mellitus: Secondary | ICD-10-CM

## 2017-05-07 DIAGNOSIS — E039 Hypothyroidism, unspecified: Secondary | ICD-10-CM

## 2017-05-07 DIAGNOSIS — E669 Obesity, unspecified: Secondary | ICD-10-CM

## 2017-05-07 DIAGNOSIS — Z130 Encounter for screening for diseases of the blood and blood-forming organs and certain disorders involving the immune mechanism: Secondary | ICD-10-CM | POA: Diagnosis not present

## 2017-05-07 DIAGNOSIS — F2081 Schizophreniform disorder: Secondary | ICD-10-CM

## 2017-05-07 MED ORDER — PROMETHAZINE HCL 25 MG PO TABS
ORAL_TABLET | ORAL | 0 refills | Status: DC
Start: 2017-05-07 — End: 2018-05-21

## 2017-05-07 MED ORDER — TEMAZEPAM 30 MG PO CAPS: 30.0000 mg | ORAL_CAPSULE | Freq: Every evening | ORAL | 0 refills | Status: DC | PRN

## 2017-05-07 NOTE — Patient Instructions (Addendum)
With temazepam it's very important not to take every night to avoid dependence. Aim to take 5 days or less weekly, try melatonin or benadryl as alternatives.   Can try melatonin 5mg -15 mg at night for sleep, can also do benadryl 25-50mg  at night for sleep.  Also here is some information about good sleep hygiene.   Insomnia Insomnia is frequent trouble falling and/or staying asleep. Insomnia can be a long term problem or a short term problem. Both are common. Insomnia can be a short term problem when the wakefulness is related to a certain stress or worry. Long term insomnia is often related to ongoing stress during waking hours and/or poor sleeping habits. Overtime, sleep deprivation itself can make the problem worse. Every little thing feels more severe because you are overtired and your ability to cope is decreased. CAUSES   Stress, anxiety, and depression.  Poor sleeping habits.  Distractions such as TV in the bedroom.  Naps close to bedtime.  Engaging in emotionally charged conversations before bed.  Technical reading before sleep.  Alcohol and other sedatives. They may make the problem worse. They can hurt normal sleep patterns and normal dream activity.  Stimulants such as caffeine for several hours prior to bedtime.  Pain syndromes and shortness of breath can cause insomnia.  Exercise late at night.  Changing time zones may cause sleeping problems (jet lag). It is sometimes helpful to have someone observe your sleeping patterns. They should look for periods of not breathing during the night (sleep apnea). They should also look to see how long those periods last. If you live alone or observers are uncertain, you can also be observed at a sleep clinic where your sleep patterns will be professionally monitored. Sleep apnea requires a checkup and treatment. Give your caregivers your medical history. Give your caregivers observations your family has made about your sleep.  SYMPTOMS    Not feeling rested in the morning.  Anxiety and restlessness at bedtime.  Difficulty falling and staying asleep. TREATMENT   Your caregiver may prescribe treatment for an underlying medical disorders. Your caregiver can give advice or help if you are using alcohol or other drugs for self-medication. Treatment of underlying problems will usually eliminate insomnia problems.  Medications can be prescribed for short time use. They are generally not recommended for lengthy use.  Over-the-counter sleep medicines are not recommended for lengthy use. They can be habit forming.  You can promote easier sleeping by making lifestyle changes such as:  Using relaxation techniques that help with breathing and reduce muscle tension.  Exercising earlier in the day.  Changing your diet and the time of your last meal. No night time snacks.  Establish a regular time to go to bed.  Counseling can help with stressful problems and worry.  Soothing music and white noise may be helpful if there are background noises you cannot remove.  Stop tedious detailed work at least one hour before bedtime. HOME CARE INSTRUCTIONS   Keep a diary. Inform your caregiver about your progress. This includes any medication side effects. See your caregiver regularly. Take note of:  Times when you are asleep.  Times when you are awake during the night.  The quality of your sleep.  How you feel the next day. This information will help your caregiver care for you.  Get out of bed if you are still awake after 15 minutes. Read or do some quiet activity. Keep the lights down. Wait until you feel sleepy and go  back to bed.  Keep regular sleeping and waking hours. Avoid naps.  Exercise regularly.  Avoid distractions at bedtime. Distractions include watching television or engaging in any intense or detailed activity like attempting to balance the household checkbook.  Develop a bedtime ritual. Keep a familiar  routine of bathing, brushing your teeth, climbing into bed at the same time each night, listening to soothing music. Routines increase the success of falling to sleep faster.  Use relaxation techniques. This can be using breathing and muscle tension release routines. It can also include visualizing peaceful scenes. You can also help control troubling or intruding thoughts by keeping your mind occupied with boring or repetitive thoughts like the old concept of counting sheep. You can make it more creative like imagining planting one beautiful flower after another in your backyard garden.  During your day, work to eliminate stress. When this is not possible use some of the previous suggestions to help reduce the anxiety that accompanies stressful situations. MAKE SURE YOU:   Understand these instructions.  Will watch your condition.  Will get help right away if you are not doing well or get worse. Document Released: 03/07/2000 Document Revised: 06/02/2011 Document Reviewed: 04/07/2007 [facility] Patient Information 2015 [place], [place]. This information is not intended to replace advice given to you by your health care provider. Make sure you discuss any questions you have with your health care provider.      Tumeric + bioprene, tart cherry supplement for aches  Aim for 7+ servings of fruits and vegetables daily  80+ fluid ounces of water or unsweet tea for healthy kidneys  1 drink of alcohol per day  Limit animal fats in diet for cholesterol and heart health - choose grass fed whenever available  Aim for low stress - take time to unwind and care for your mental health  Aim for 150 min of moderate intensity exercise weekly for heart health, and weights twice weekly for bone health  Aim for 7-9 hours of sleep daily   Here is some information to help you keep your heart healthy: Move it! - Aim for 30 mins of activity every day. Take it slowly at first. Talk to [place] before starting any  new exercise program.   Lose it.  -Body Mass Index (BMI) can indicate if you need to lose weight. A healthy range is 18.5-24.9. For a BMI calculator, go to [employer]  Waist Management -Excess abdominal fat is a risk factor for heart disease, diabetes, asthma, stroke and more. Ideal waist circumference is less than 35" for women and less than 40" for men.   Eat Right -focus on fruits, vegetables, whole grains, and meals you make yourself. Avoid foods with trans fat and high sugar/sodium content.   Snooze or Snore? - Loud snoring can be a sign of sleep apnea, a significant risk factor for high blood pressure, heart attach, stroke, and heart arrhythmias.  Kick the habit -Quit Smoking! Avoid second hand smoke. A single cigarette raises your blood pressure for 20 mins and increases the risk of heart attack and stroke for the next 24 hours.   Are Aspirin and Supplements right for you? -Add ENTERIC COATED low dose 81 mg Aspirin daily OR can do every other day if you have easy bruising to protect your heart and head. As well as to reduce risk of Colon Cancer by 20 %, Skin Cancer by 26 % , Melanoma by 46% and Pancreatic cancer by 60%  Say "No to Stress -There may  be little you can do about problems that cause stress. However, techniques such as long walks, meditation, and exercise can help you manage it.   Start Now! - Make changes one at a time and set reasonable goals to increase your likelihood of success.

## 2017-05-08 LAB — URINALYSIS W MICROSCOPIC + REFLEX CULTURE
Bacteria, UA: NONE SEEN /HPF
Bilirubin Urine: NEGATIVE
Glucose, UA: NEGATIVE
Hgb urine dipstick: NEGATIVE
Hyaline Cast: NONE SEEN /LPF
Ketones, ur: NEGATIVE
Leukocyte Esterase: NEGATIVE
Nitrites, Initial: NEGATIVE
Protein, ur: NEGATIVE
RBC / HPF: NONE SEEN /HPF (ref 0–2)
Specific Gravity, Urine: 1.005 (ref 1.001–1.03)
Squamous Epithelial / LPF: NONE SEEN /HPF (ref <=5)
WBC, UA: NONE SEEN /HPF (ref 0–5)
pH: 7.5 (ref 5.0–8.0)

## 2017-05-08 LAB — CBC WITH DIFFERENTIAL/PLATELET
Basophils Absolute: 50 cells/uL (ref 0–200)
Basophils Relative: 0.8 %
Eosinophils Absolute: 158 cells/uL (ref 15–500)
Eosinophils Relative: 2.5 %
HCT: 37.8 % (ref 35.0–45.0)
Hemoglobin: 12.8 g/dL (ref 11.7–15.5)
Lymphs Abs: 1556 cells/uL (ref 850–3900)
MCH: 32.2 pg (ref 27.0–33.0)
MCHC: 33.9 g/dL (ref 32.0–36.0)
MCV: 95 fL (ref 80.0–100.0)
MPV: 9.3 fL (ref 7.5–12.5)
Monocytes Relative: 11.4 %
Neutro Abs: 3818 cells/uL (ref 1500–7800)
Neutrophils Relative %: 60.6 %
Platelets: 361 10*3/uL (ref 140–400)
RBC: 3.98 10*6/uL (ref 3.80–5.10)
RDW: 12 % (ref 11.0–15.0)
Total Lymphocyte: 24.7 %
WBC mixed population: 718 cells/uL (ref 200–950)
WBC: 6.3 10*3/uL (ref 3.8–10.8)

## 2017-05-08 LAB — HEPATIC FUNCTION PANEL
AG Ratio: 2 (calc) (ref 1.0–2.5)
ALT: 17 U/L (ref 6–29)
AST: 20 U/L (ref 10–30)
Albumin: 4.3 g/dL (ref 3.6–5.1)
Alkaline phosphatase (APISO): 53 U/L (ref 33–115)
Bilirubin, Direct: 0.1 mg/dL (ref 0.0–0.2)
Globulin: 2.1 g/dL (calc) (ref 1.9–3.7)
Indirect Bilirubin: 0.6 mg/dL (calc) (ref 0.2–1.2)
Total Bilirubin: 0.7 mg/dL (ref 0.2–1.2)
Total Protein: 6.4 g/dL (ref 6.1–8.1)

## 2017-05-08 LAB — BASIC METABOLIC PANEL WITH GFR
BUN: 11 mg/dL (ref 7–25)
CO2: 29 mmol/L (ref 20–32)
Calcium: 9.2 mg/dL (ref 8.6–10.2)
Chloride: 102 mmol/L (ref 98–110)
Creat: 0.89 mg/dL (ref 0.50–1.10)
GFR, Est African American: 93 mL/min/{1.73_m2} (ref >=60)
GFR, Est Non African American: 80 mL/min/{1.73_m2} (ref >=60)
Glucose, Bld: 78 mg/dL (ref 65–99)
Potassium: 4.5 mmol/L (ref 3.5–5.3)
Sodium: 139 mmol/L (ref 135–146)

## 2017-05-08 LAB — LIPID PANEL
Cholesterol: 154 mg/dL (ref <200)
HDL: 54 mg/dL (ref >50)
LDL Cholesterol (Calc): 79 mg/dL (calc)
Non-HDL Cholesterol (Calc): 100 mg/dL (calc) (ref <130)
Total CHOL/HDL Ratio: 2.9 (calc) (ref <5.0)
Triglycerides: 114 mg/dL (ref <150)

## 2017-05-08 LAB — MICROALBUMIN / CREATININE URINE RATIO
Creatinine, Urine: 13 mg/dL — ABNORMAL LOW (ref 20–275)
Microalb Creat Ratio: 15 mcg/mg creat (ref <30)
Microalb, Ur: 0.2 mg/dL

## 2017-05-08 LAB — NO CULTURE INDICATED

## 2017-05-08 LAB — VITAMIN D 25 HYDROXY (VIT D DEFICIENCY, FRACTURES): Vit D, 25-Hydroxy: 53 ng/mL (ref 30–100)

## 2017-05-08 LAB — HEMOGLOBIN A1C
Hgb A1c MFr Bld: 5.1 % of total Hgb (ref <5.7)
Mean Plasma Glucose: 100 (calc)
eAG (mmol/L): 5.5 (calc)

## 2017-05-08 LAB — VITAMIN B12: Vitamin B-12: 1329 pg/mL — ABNORMAL HIGH (ref 200–1100)

## 2017-05-08 LAB — TSH: TSH: 1.32 mIU/L

## 2017-06-03 DIAGNOSIS — M79603 Pain in arm, unspecified: Secondary | ICD-10-CM | POA: Diagnosis not present

## 2017-06-24 DIAGNOSIS — M79603 Pain in arm, unspecified: Secondary | ICD-10-CM | POA: Diagnosis not present

## 2017-06-29 ENCOUNTER — Other Ambulatory Visit: Payer: Self-pay | Admitting: Physician Assistant

## 2017-07-14 DIAGNOSIS — M79603 Pain in arm, unspecified: Secondary | ICD-10-CM | POA: Diagnosis not present

## 2017-07-31 DIAGNOSIS — M79603 Pain in arm, unspecified: Secondary | ICD-10-CM | POA: Diagnosis not present

## 2017-08-04 ENCOUNTER — Encounter: Payer: Self-pay | Admitting: *Deleted

## 2017-08-15 ENCOUNTER — Encounter: Payer: Self-pay | Admitting: Adult Health

## 2017-08-16 ENCOUNTER — Other Ambulatory Visit: Payer: Self-pay | Admitting: Adult Health

## 2017-08-16 MED ORDER — PREDNISONE 20 MG PO TABS: ORAL_TABLET | ORAL | 0 refills | Status: AC

## 2017-08-21 ENCOUNTER — Other Ambulatory Visit: Payer: Self-pay | Admitting: Adult Health

## 2017-09-02 DIAGNOSIS — M9902 Segmental and somatic dysfunction of thoracic region: Secondary | ICD-10-CM | POA: Diagnosis not present

## 2017-09-02 DIAGNOSIS — M9901 Segmental and somatic dysfunction of cervical region: Secondary | ICD-10-CM | POA: Diagnosis not present

## 2017-09-02 DIAGNOSIS — M9903 Segmental and somatic dysfunction of lumbar region: Secondary | ICD-10-CM | POA: Diagnosis not present

## 2017-09-02 DIAGNOSIS — M9904 Segmental and somatic dysfunction of sacral region: Secondary | ICD-10-CM | POA: Diagnosis not present

## 2017-09-11 DIAGNOSIS — M9904 Segmental and somatic dysfunction of sacral region: Secondary | ICD-10-CM | POA: Diagnosis not present

## 2017-09-11 DIAGNOSIS — M9902 Segmental and somatic dysfunction of thoracic region: Secondary | ICD-10-CM | POA: Diagnosis not present

## 2017-09-11 DIAGNOSIS — M9901 Segmental and somatic dysfunction of cervical region: Secondary | ICD-10-CM | POA: Diagnosis not present

## 2017-09-11 DIAGNOSIS — M9903 Segmental and somatic dysfunction of lumbar region: Secondary | ICD-10-CM | POA: Diagnosis not present

## 2017-09-18 DIAGNOSIS — M9902 Segmental and somatic dysfunction of thoracic region: Secondary | ICD-10-CM | POA: Diagnosis not present

## 2017-09-18 DIAGNOSIS — M9901 Segmental and somatic dysfunction of cervical region: Secondary | ICD-10-CM | POA: Diagnosis not present

## 2017-09-18 DIAGNOSIS — M9904 Segmental and somatic dysfunction of sacral region: Secondary | ICD-10-CM | POA: Diagnosis not present

## 2017-09-18 DIAGNOSIS — M9903 Segmental and somatic dysfunction of lumbar region: Secondary | ICD-10-CM | POA: Diagnosis not present

## 2017-10-02 ENCOUNTER — Encounter: Payer: Self-pay | Admitting: Adult Health

## 2017-10-02 ENCOUNTER — Other Ambulatory Visit: Payer: Self-pay | Admitting: Adult Health

## 2017-10-02 MED ORDER — TERBINAFINE HCL 1 % EX CREA: 1.0000 "application " | TOPICAL_CREAM | Freq: Two times a day (BID) | CUTANEOUS | 1 refills | Status: DC

## 2017-11-02 DIAGNOSIS — G4700 Insomnia, unspecified: Secondary | ICD-10-CM | POA: Insufficient documentation

## 2017-11-02 NOTE — Progress Notes (Signed)
FOLLOW UP  Assessment and Plan:   Obesity with co morbidities Long discussion about weight loss, diet, and exercise Recommended diet heavy in fruits and veggies and low in animal meats, cheeses, and dairy products, appropriate calorie intake Discussed ideal weight for height  Will follow up at next appointment  Hypothyroidism continue medications the same pending lab results reminded to take on an empty stomach 30-35mins before food.  check TSH level  Vitamin D Def Below goal at last visit; continue supplementation  Check Vit D level   Continue diet and meds as discussed. Further disposition pending results of labs. Discussed med's effects and SE's.   Over 30 minutes of exam, counseling, chart review, and critical decision making was performed.   Future Appointments  Date Time Provider [place]  05/10/2018  9:00 AM Liane Comber, NP [facility] None    ----------------------------------------------------------------------------------------------------------------------  HPI 42 y.o. female  presents for 6 month follow up on weight, hypothyroid and vitamin D deficiency. 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 Adele Schilder but is now off of medications other than temezepam for sleep and feels she is doing well off of her medications.   She contacted office with concern of possible mycotic nails and has been applying topical lamisil 1%; nails appear very faintly yellowed, not thickened or notably brittle, with a few vertical ridges to right great toe. Appearance is reportedly stable for several years. Discussed low suspicion for fungal infection. She is on B12 supplement intermittently and takes iron as well.   She reports her back pain has improved, but occasionally gets aggravated when doing bridge pose in yoga, but resolves in a few days.   She currently takes temazepam 30 mg as needed for sleep, typically takes a few times per month.  BMI is Body  mass index is 31.29 kg/m., she has been working on diet and exercise, walks most days, does yoga, watching portions very closely.  Wt Readings from Last 3 Encounters:  11/04/17 165 lb 9.6 oz (75.1 kg)  05/07/17 186 lb (84.4 kg)  12/04/16 168 lb 3.2 oz (76.3 kg)   Today their BP is BP: 114/70  She does workout. She denies chest pain, shortness of breath, dizziness.  She is on thyroid medication. Her medication was not changed last visit.   Lab Results  Component Value Date   TSH 1.32 05/07/2017   Patient is on Vitamin D supplement.   Lab Results  Component Value Date   VD25OH 53 05/07/2017        Current Medications:  Current Outpatient Medications on File Prior to Visit  Medication Sig  . CALCIUM CITRATE PO Take 1,000 mg by mouth.  . carisoprodol (SOMA) 350 MG tablet Take 350 mg by mouth as needed for muscle spasms.  . Cholecalciferol (VITAMIN D PO) Take 10,000 Units by mouth daily.   Marland Kitchen levothyroxine (SYNTHROID, LEVOTHROID) 75 MCG tablet TAKE 1 TABLET(75 MCG) BY MOUTH DAILY BEFORE BREAKFAST  . magnesium 30 MG tablet Take 30 mg by mouth 2 (two) times daily.  . Multiple Vitamin (MULTIVITAMIN WITH MINERALS) TABS tablet Take 1 tablet by mouth daily.  . Omega-3 Fatty Acids (FISH OIL) 1000 MG CPDR Take by mouth.  . promethazine (PHENERGAN) 25 MG tablet Take every 6 hours as needed for nausea or headache.  . temazepam (RESTORIL) 30 MG capsule TAKE 1 CAPSULE(30 MG) BY MOUTH AT BEDTIME AS NEEDED FOR SLEEP  . terbinafine (LAMISIL) 1 % cream Apply 1 application topically 2 (two)  times daily. For 4 weeks  . Turmeric 450 MG CAPS Take by mouth.  . calcium acetate (PHOSLO) 667 MG capsule Take by mouth 3 (three) times daily with meals.  . phentermine (ADIPEX-P) 37.5 MG tablet Take 1/2 to 1 tablet every morning for dieting & weightloss (Patient not taking: Reported on 05/07/2017)   No current facility-administered medications on file prior to visit.      Allergies:  Allergies  Allergen  Reactions  . Hydrocodone Itching  . Oxycodone-Acetaminophen   . Penicillins Swelling     Medical History:  Past Medical History:  Diagnosis Date  . Allergy   . Anxiety   . Arthritis   . Major depressive disorder, 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[place]) 04/15/2016  . Thyroid disease    Family history- Reviewed and unchanged Social history- Reviewed and unchanged   Review of Systems:  Review of Systems  Constitutional: Negative for malaise/fatigue and weight loss.  HENT: Negative for hearing loss and tinnitus.   Eyes: Negative for blurred vision and double vision.  Respiratory: Negative for cough, shortness of breath and wheezing.   Cardiovascular: Negative for chest pain, palpitations, orthopnea, claudication and leg swelling.  Gastrointestinal: Negative for abdominal pain, blood in stool, constipation, diarrhea, heartburn, melena, nausea and vomiting.  Genitourinary: Negative.   Musculoskeletal: Negative for joint pain and myalgias.  Skin: Negative for rash.  Neurological: Negative for dizziness, tingling, sensory change, weakness and headaches.  Endo/Heme/Allergies: Negative for polydipsia.  Psychiatric/Behavioral: Negative.   All other systems reviewed and are negative.   Physical Exam: BP 114/70   Pulse 69   Temp 97.9 F (36.6 C)   Ht 5\' 1"  (1.549 m)   Wt 165 lb 9.6 oz (75.1 kg)   SpO2 99%   BMI 31.29 kg/m  Wt Readings from Last 3 Encounters:  11/04/17 165 lb 9.6 oz (75.1 kg)  05/07/17 186 lb (84.4 kg)  12/04/16 168 lb 3.2 oz (76.3 kg)   General Appearance: Well nourished, in no apparent distress. Eyes: PERRLA, EOMs, conjunctiva no swelling or erythema Sinuses: No Frontal/maxillary tenderness ENT/Mouth: Ext aud canals clear, TMs without erythema, bulging. No erythema, swelling, or exudate on post pharynx.  Tonsils not swollen or erythematous. Hearing normal.  Neck: Supple, thyroid normal.  Respiratory: Respiratory effort normal, BS equal bilaterally  without rales, rhonchi, wheezing or stridor.  Cardio: RRR with no MRGs. Brisk peripheral pulses without edema.  Abdomen: Soft, + BS.  Non tender, no guarding, rebound, hernias, masses. Lymphatics: Non tender without lymphadenopathy.  Musculoskeletal: Full ROM, 5/5 strength, Normal gait Skin: Warm, dry without rashes, lesions, ecchymosis. Nails not thickened; mildly yellow, with few vertical ridges, not notably brittle.  Neuro: Cranial nerves intact. No cerebellar symptoms.  Psych: Awake and oriented X 3, normal affect, Insight and Judgment appropriate.    Charlotte Ribas, NP 8:58 AM [facility]

## 2017-11-04 ENCOUNTER — Encounter: Payer: Self-pay | Admitting: Adult Health

## 2017-11-04 ENCOUNTER — Ambulatory Visit: Payer: BLUE CROSS/BLUE SHIELD | Admitting: Adult Health

## 2017-11-04 VITALS — BP 114/70 | HR 69 | Temp 97.9°F | Ht 61.0 in | Wt 165.6 lb

## 2017-11-04 DIAGNOSIS — E039 Hypothyroidism, unspecified: Secondary | ICD-10-CM | POA: Diagnosis not present

## 2017-11-04 DIAGNOSIS — Z79899 Other long term (current) drug therapy: Secondary | ICD-10-CM | POA: Diagnosis not present

## 2017-11-04 DIAGNOSIS — E669 Obesity, unspecified: Secondary | ICD-10-CM | POA: Diagnosis not present

## 2017-11-04 DIAGNOSIS — E559 Vitamin D deficiency, unspecified: Secondary | ICD-10-CM | POA: Diagnosis not present

## 2017-11-04 DIAGNOSIS — G4700 Insomnia, unspecified: Secondary | ICD-10-CM

## 2017-11-04 NOTE — Patient Instructions (Addendum)
Know what a healthy weight is for you (roughly BMI <25) and aim to maintain this  Aim for 5-10 servings of fruits and vegetables daily  65-80+ fluid ounces of water or unsweet tea for healthy kidneys  Limit alcohol; avoid smoking/tobacco  Limit animal fats in diet for cholesterol and heart health - choose grass fed whenever available  Avoid highly processed foods, and foods high in saturated/trans fats  Aim for low stress - take time to unwind and care for your mental health  Aim for 150 min of moderate intensity exercise weekly for heart health, and weights twice weekly for bone health  Aim for 7-9 hours of sleep daily       When it comes to diets, agreement about the perfect plan isn't easy to find, even among the experts. Experts at the [employer] developed an idea known as the Healthy Eating Plate. Just imagine a plate divided into logical, healthy portions.  The emphasis is on diet quality:  Load up on vegetables and fruits - one-half of your plate: Aim for color and variety, and remember that potatoes don't count.  Go for whole grains - one-quarter of your plate: Whole wheat, barley, wheat berries, quinoa, oats, brown rice, and foods made with them. If you want pasta, go with whole wheat pasta.  Protein power - one-quarter of your plate: Fish, chicken, beans, and nuts are all healthy, versatile protein sources. Limit red meat.  The diet, however, does go beyond the plate, offering a few other suggestions.  Use healthy plant oils, such as olive, canola, soy, corn, sunflower and peanut. Check the labels, and avoid partially hydrogenated oil, which have unhealthy trans fats.  If you're thirsty, drink water. Coffee and tea are good in moderation, but skip sugary drinks and limit milk and dairy products to one or two daily servings.  The type of carbohydrate in the diet is more important than the amount. Some sources of carbohydrates, such as vegetables,  fruits, whole grains, and beans-are healthier than others.  Finally, stay active.    Back Exercises The following exercises strengthen the muscles that help to support the back. They also help to keep the lower back flexible. Doing these exercises can help to prevent back pain or lessen existing pain. If you have back pain or discomfort, try doing these exercises 2-3 times each day or as told by your health care provider. When the pain goes away, do them once each day, but increase the number of times that you repeat the steps for each exercise (do more repetitions). If you do not have back pain or discomfort, do these exercises once each day or as told by your health care provider. Exercises Single Knee to Chest  Repeat these steps 3-5 times for each leg: 1. Lie on your back on a firm bed or the floor with your legs extended. 2. Bring one knee to your chest. Your other leg should stay extended and in contact with the floor. 3. Hold your knee in place by grabbing your knee or thigh. 4. Pull on your knee until you feel a gentle stretch in your lower back. 5. Hold the stretch for 10-30 seconds. 6. Slowly release and straighten your leg.  Pelvic Tilt  Repeat these steps 5-10 times: 1. Lie on your back on a firm bed or the floor with your legs extended. 2. Bend your knees so they are pointing toward the ceiling and your feet are flat on the floor.  3. Tighten your lower abdominal muscles to press your lower back against the floor. This motion will tilt your pelvis so your tailbone points up toward the ceiling instead of pointing to your feet or the floor. 4. With gentle tension and even breathing, hold this position for 5-10 seconds.  Cat-Cow  Repeat these steps until your lower back becomes more flexible: 1. Get into a hands-and-knees position on a firm surface. Keep your hands under your shoulders, and keep your knees under your hips. You may place padding under your knees for  comfort. 2. Let your head hang down, and point your tailbone toward the floor so your lower back becomes rounded like the back of a cat. 3. Hold this position for 5 seconds. 4. Slowly lift your head and point your tailbone up toward the ceiling so your back forms a sagging arch like the back of a cow. 5. Hold this position for 5 seconds.  Press-Ups  Repeat these steps 5-10 times: 1. Lie on your abdomen (face-down) on the floor. 2. Place your palms near your head, about shoulder-width apart. 3. While you keep your back as relaxed as possible and keep your hips on the floor, slowly straighten your arms to raise the top half of your body and lift your shoulders. Do not use your back muscles to raise your upper torso. You may adjust the placement of your hands to make yourself more comfortable. 4. Hold this position for 5 seconds while you keep your back relaxed. 5. Slowly return to lying flat on the floor.  Bridges  Repeat these steps 10 times: 1. Lie on your back on a firm surface. 2. Bend your knees so they are pointing toward the ceiling and your feet are flat on the floor. 3. Tighten your buttocks muscles and lift your buttocks off of the floor until your waist is at almost the same height as your knees. You should feel the muscles working in your buttocks and the back of your thighs. If you do not feel these muscles, slide your feet 1-2 inches farther away from your buttocks. 4. Hold this position for 3-5 seconds. 5. Slowly lower your hips to the starting position, and allow your buttocks muscles to relax completely.  If this exercise is too easy, try doing it with your arms crossed over your chest. Abdominal Crunches  Repeat these steps 5-10 times: 1. Lie on your back on a firm bed or the floor with your legs extended. 2. Bend your knees so they are pointing toward the ceiling and your feet are flat on the floor. 3. Cross your arms over your chest. 4. Tip your chin slightly toward  your chest without bending your neck. 5. Tighten your abdominal muscles and slowly raise your trunk (torso) high enough to lift your shoulder blades a tiny bit off of the floor. Avoid raising your torso higher than that, because it can put too much stress on your low back and it does not help to strengthen your abdominal muscles. 6. Slowly return to your starting position.  Back Lifts Repeat these steps 5-10 times: 1. Lie on your abdomen (face-down) with your arms at your sides, and rest your forehead on the floor. 2. Tighten the muscles in your legs and your buttocks. 3. Slowly lift your chest off of the floor while you keep your hips pressed to the floor. Keep the back of your head in line with the curve in your back. Your eyes should be looking at the  floor. 4. Hold this position for 3-5 seconds. 5. Slowly return to your starting position.  Contact a health care provider if:  Your back pain or discomfort gets much worse when you do an exercise.  Your back pain or discomfort does not lessen within 2 hours after you exercise. If you have any of these problems, stop doing these exercises right away. Do not do them again unless your health care provider says that you can. Get help right away if:  You develop sudden, severe back pain. If this happens, stop doing the exercises right away. Do not do them again unless your health care provider says that you can. This information is not intended to replace advice given to you by your health care provider. Make sure you discuss any questions you have with your health care provider. Document Released: 04/17/2004 Document Revised: 07/18/2015 Document Reviewed: 05/04/2014 Elsevier Interactive Patient Education  2017 [place] Injury Prevention Back injuries can be very painful. They can also be difficult to heal. After having one back injury, you are more likely to injure your back again. It is important to learn how to avoid injuring  or re-injuring your back. The following tips can help you to prevent a back injury. What should I know about physical fitness?  Exercise for 30 minutes per day on most days of the week or as directed by your health care provider. Make sure to: ? Do aerobic exercises, such as walking, jogging, biking, or swimming. ? Do exercises that increase balance and strength, such as tai chi and yoga. These can decrease your risk of falling and injuring your back. ? Do stretching exercises to help with flexibility. ? Try to develop strong abdominal muscles. Your abdominal muscles provide a lot of the support that is needed by your back.  Maintain a healthy weight. This helps to decrease your risk of a back injury. What should I know about my diet?  Talk with your health care provider about your overall diet. Take supplements and vitamins only as directed by your health care provider.  Talk with your health care provider about how much calcium and vitamin D you need each day. These nutrients help to prevent weakening of the bones (osteoporosis). Osteoporosis can cause broken (fractured) bones, which lead to back pain.  Include good sources of calcium in your diet, such as dairy products, green leafy vegetables, and products that have had calcium added to them (fortified).  Include good sources of vitamin D in your diet, such as milk and foods that are fortified with vitamin D. What should I know about my posture?  Sit up straight and stand up straight. Avoid leaning forward when you sit or hunching over when you stand.  Choose chairs that have good low-back (lumbar) support.  If you work at a desk, sit close to it so you do not need to lean over. Keep your chin tucked in. Keep your neck drawn back, and keep your elbows bent at a right angle. Your arms should look like the letter "L."  Sit high and close to the steering wheel when you drive. Add a lumbar support to your car seat, if needed.  Avoid  sitting or standing in one position for very long. Take breaks to get up, stretch, and walk around at least one time every hour. Take breaks every hour if you are driving for long periods of time.  Sleep on your side with your knees slightly bent, or  sleep on your back with a pillow under your knees. Do not lie on the front of your body to sleep. What should I know about lifting, twisting, and reaching? Lifting and Heavy Lifting   Avoid heavy lifting, especially repetitive heavy lifting. If you must do heavy lifting: ? Stretch before lifting. ? Work slowly. ? Rest between lifts. ? Use a tool such as a cart or a dolly to move objects if one is available. ? Make several small trips instead of carrying one heavy load. ? Ask for help when you need it, especially when moving big objects.  Follow these steps when lifting: ? Stand with your feet shoulder-width apart. ? Get as close to the object as you can. Do not try to pick up a heavy object that is far from your body. ? Use handles or lifting straps if they are available. ? Bend at your knees. Squat down, but keep your heels off the floor. ? Keep your shoulders pulled back, your chin tucked in, and your back straight. ? Lift the object slowly while you tighten the muscles in your legs, abdomen, and buttocks. Keep the object as close to the center of your body as possible.  Follow these steps when putting down a heavy load: ? Stand with your feet shoulder-width apart. ? Lower the object slowly while you tighten the muscles in your legs, abdomen, and buttocks. Keep the object as close to the center of your body as possible. ? Keep your shoulders pulled back, your chin tucked in, and your back straight. ? Bend at your knees. Squat down, but keep your heels off the floor. ? Use handles or lifting straps if they are available. Twisting and Reaching  Avoid lifting heavy objects above your waist.  Do not twist at your waist while you are  lifting or carrying a load. If you need to turn, move your feet.  Do not bend over without bending at your knees.  Avoid reaching over your head, across a table, or for an object on a high surface. What are some other tips?  Avoid wet floors and icy ground. Keep sidewalks clear of ice to prevent falls.  Do not sleep on a mattress that is too soft or too hard.  Keep items that are used frequently within easy reach.  Put heavier objects on shelves at waist level, and put lighter objects on lower or higher shelves.  Find ways to decrease your stress, such as exercise, massage, or relaxation techniques. Stress can build up in your muscles. Tense muscles are more vulnerable to injury.  Talk with your health care provider if you feel anxious or depressed. These conditions can make back pain worse.  Wear flat heel shoes with cushioned soles.  Avoid sudden movements.  Use both shoulder straps when carrying a backpack.  Do not use any tobacco products, including cigarettes, chewing tobacco, or electronic cigarettes. If you need help quitting, ask your health care provider. This information is not intended to replace advice given to you by your health care provider. Make sure you discuss any questions you have with your health care provider. Document Released: 04/17/2004 Document Revised: 08/16/2015 Document Reviewed: 03/14/2014 Elsevier Interactive Patient Education  [employer].

## 2017-11-05 ENCOUNTER — Other Ambulatory Visit: Payer: Self-pay | Admitting: Adult Health

## 2017-11-05 DIAGNOSIS — E039 Hypothyroidism, unspecified: Secondary | ICD-10-CM

## 2017-11-05 LAB — COMPLETE METABOLIC PANEL WITH GFR
AG Ratio: 2.1 (calc) (ref 1.0–2.5)
ALT: 10 U/L (ref 6–29)
AST: 14 U/L (ref 10–30)
Albumin: 4.5 g/dL (ref 3.6–5.1)
Alkaline phosphatase (APISO): 45 U/L (ref 33–115)
BUN: 14 mg/dL (ref 7–25)
CO2: 29 mmol/L (ref 20–32)
Calcium: 9.5 mg/dL (ref 8.6–10.2)
Chloride: 102 mmol/L (ref 98–110)
Creat: 0.73 mg/dL (ref 0.50–1.10)
GFR, Est African American: 117 mL/min/{1.73_m2} (ref >=60)
GFR, Est Non African American: 101 mL/min/{1.73_m2} (ref >=60)
Globulin: 2.1 g/dL (calc) (ref 1.9–3.7)
Glucose, Bld: 106 mg/dL — ABNORMAL HIGH (ref 65–99)
Potassium: 4.7 mmol/L (ref 3.5–5.3)
Sodium: 141 mmol/L (ref 135–146)
Total Bilirubin: 0.8 mg/dL (ref 0.2–1.2)
Total Protein: 6.6 g/dL (ref 6.1–8.1)

## 2017-11-05 LAB — CBC WITH DIFFERENTIAL/PLATELET
Basophils Absolute: 41 cells/uL (ref 0–200)
Basophils Relative: 0.7 %
Eosinophils Absolute: 180 cells/uL (ref 15–500)
Eosinophils Relative: 3.1 %
HCT: 37 % (ref 35.0–45.0)
Hemoglobin: 12.5 g/dL (ref 11.7–15.5)
Lymphs Abs: 1868 cells/uL (ref 850–3900)
MCH: 32.9 pg (ref 27.0–33.0)
MCHC: 33.8 g/dL (ref 32.0–36.0)
MCV: 97.4 fL (ref 80.0–100.0)
MPV: 9.6 fL (ref 7.5–12.5)
Monocytes Relative: 10.8 %
Neutro Abs: 3086 cells/uL (ref 1500–7800)
Neutrophils Relative %: 53.2 %
Platelets: 366 10*3/uL (ref 140–400)
RBC: 3.8 10*6/uL (ref 3.80–5.10)
RDW: 12.2 % (ref 11.0–15.0)
Total Lymphocyte: 32.2 %
WBC mixed population: 626 cells/uL (ref 200–950)
WBC: 5.8 10*3/uL (ref 3.8–10.8)

## 2017-11-05 LAB — VITAMIN D 25 HYDROXY (VIT D DEFICIENCY, FRACTURES): Vit D, 25-Hydroxy: 73 ng/mL (ref 30–100)

## 2017-11-05 LAB — TSH: TSH: 0.06 mIU/L — ABNORMAL LOW

## 2017-11-05 MED ORDER — LEVOTHYROXINE SODIUM 75 MCG PO TABS: ORAL_TABLET | ORAL | 0 refills | Status: DC

## 2017-12-22 ENCOUNTER — Other Ambulatory Visit: Payer: Self-pay | Admitting: Internal Medicine

## 2017-12-22 ENCOUNTER — Other Ambulatory Visit: Payer: Self-pay | Admitting: Adult Health

## 2017-12-24 ENCOUNTER — Other Ambulatory Visit: Payer: BLUE CROSS/BLUE SHIELD

## 2017-12-24 DIAGNOSIS — E039 Hypothyroidism, unspecified: Secondary | ICD-10-CM | POA: Diagnosis not present

## 2017-12-25 ENCOUNTER — Telehealth: Payer: Self-pay | Admitting: *Deleted

## 2017-12-25 LAB — TSH: TSH: 0.19 mIU/L — ABNORMAL LOW

## 2017-12-25 NOTE — Telephone Encounter (Signed)
Patient called said she did not know directions for her was in August to change med for thyroid medicine.  She did not change her med in Aug so this appoint was not accurate she is going to make adjustments like you had asked her to do in Aug now & call me back Monday to schedule another 6 wk to recheck TSH again.  She said she was sorry she didn't understand or see directions.

## 2018-02-08 ENCOUNTER — Other Ambulatory Visit: Payer: Self-pay

## 2018-02-08 ENCOUNTER — Other Ambulatory Visit: Payer: BLUE CROSS/BLUE SHIELD

## 2018-02-08 DIAGNOSIS — E039 Hypothyroidism, unspecified: Secondary | ICD-10-CM

## 2018-02-08 DIAGNOSIS — E038 Other specified hypothyroidism: Secondary | ICD-10-CM

## 2018-02-09 LAB — TSH: TSH: 1.9 mIU/L

## 2018-02-22 ENCOUNTER — Other Ambulatory Visit: Payer: Self-pay | Admitting: Adult Health

## 2018-02-22 MED ORDER — PREDNISONE 20 MG PO TABS: ORAL_TABLET | ORAL | 0 refills | Status: DC

## 2018-03-02 ENCOUNTER — Telehealth: Payer: Self-pay

## 2018-03-02 ENCOUNTER — Other Ambulatory Visit: Payer: Self-pay | Admitting: Adult Health

## 2018-03-02 MED ORDER — CYCLOBENZAPRINE HCL 5 MG PO TABS: 5.0000 mg | ORAL_TABLET | Freq: Three times a day (TID) | ORAL | 0 refills | Status: DC | PRN

## 2018-03-02 MED ORDER — CARISOPRODOL 350 MG PO TABS: 350.0000 mg | ORAL_TABLET | Freq: Three times a day (TID) | ORAL | 0 refills | Status: DC | PRN

## 2018-03-02 MED ORDER — PREDNISONE 20 MG PO TABS: ORAL_TABLET | ORAL | 0 refills | Status: DC

## 2018-03-02 NOTE — Telephone Encounter (Signed)
Carisoprodol is not covered by insurance. Spoke with provider and will send in Flexeril instead.

## 2018-03-09 DIAGNOSIS — M545 Low back pain: Secondary | ICD-10-CM | POA: Diagnosis not present

## 2018-03-25 DIAGNOSIS — N9489 Other specified conditions associated with female genital organs and menstrual cycle: Secondary | ICD-10-CM | POA: Diagnosis not present

## 2018-03-26 DIAGNOSIS — M545 Low back pain: Secondary | ICD-10-CM | POA: Diagnosis not present

## 2018-03-26 DIAGNOSIS — A630 Anogenital (venereal) warts: Secondary | ICD-10-CM | POA: Diagnosis not present

## 2018-03-26 DIAGNOSIS — N9489 Other specified conditions associated with female genital organs and menstrual cycle: Secondary | ICD-10-CM | POA: Diagnosis not present

## 2018-03-26 DIAGNOSIS — E039 Hypothyroidism, unspecified: Secondary | ICD-10-CM | POA: Diagnosis not present

## 2018-03-31 DIAGNOSIS — M9902 Segmental and somatic dysfunction of thoracic region: Secondary | ICD-10-CM | POA: Diagnosis not present

## 2018-03-31 DIAGNOSIS — M9905 Segmental and somatic dysfunction of pelvic region: Secondary | ICD-10-CM | POA: Diagnosis not present

## 2018-03-31 DIAGNOSIS — M9903 Segmental and somatic dysfunction of lumbar region: Secondary | ICD-10-CM | POA: Diagnosis not present

## 2018-03-31 DIAGNOSIS — M9904 Segmental and somatic dysfunction of sacral region: Secondary | ICD-10-CM | POA: Diagnosis not present

## 2018-04-08 DIAGNOSIS — M9904 Segmental and somatic dysfunction of sacral region: Secondary | ICD-10-CM | POA: Diagnosis not present

## 2018-04-08 DIAGNOSIS — M9902 Segmental and somatic dysfunction of thoracic region: Secondary | ICD-10-CM | POA: Diagnosis not present

## 2018-04-08 DIAGNOSIS — M9903 Segmental and somatic dysfunction of lumbar region: Secondary | ICD-10-CM | POA: Diagnosis not present

## 2018-04-08 DIAGNOSIS — M9905 Segmental and somatic dysfunction of pelvic region: Secondary | ICD-10-CM | POA: Diagnosis not present

## 2018-04-21 ENCOUNTER — Other Ambulatory Visit: Payer: Self-pay | Admitting: Internal Medicine

## 2018-04-26 DIAGNOSIS — M545 Low back pain: Secondary | ICD-10-CM | POA: Diagnosis not present

## 2018-05-06 DIAGNOSIS — M9903 Segmental and somatic dysfunction of lumbar region: Secondary | ICD-10-CM | POA: Diagnosis not present

## 2018-05-06 DIAGNOSIS — M9904 Segmental and somatic dysfunction of sacral region: Secondary | ICD-10-CM | POA: Diagnosis not present

## 2018-05-06 DIAGNOSIS — M9905 Segmental and somatic dysfunction of pelvic region: Secondary | ICD-10-CM | POA: Diagnosis not present

## 2018-05-06 DIAGNOSIS — M9902 Segmental and somatic dysfunction of thoracic region: Secondary | ICD-10-CM | POA: Diagnosis not present

## 2018-05-10 ENCOUNTER — Encounter: Payer: Self-pay | Admitting: Adult Health

## 2018-05-10 NOTE — Progress Notes (Signed)
Complete Physical  Assessment and Plan:  Diagnoses and all orders for this visit:  Encounter for general adult medical examination with abnormal findings Follow up with Charlotte  Schizophreniform disorder ([place]) Previously managed by Charlotte Wise; reports resolved and doing well off of medications, working on lifestyle/meditation and reports stable over past year  Hypothyroidism, unspecified type continue medications the same pending lab results reminded to take on an empty stomach 30-77mins before food.  -     TSH  Vitamin D deficiency Continue with supplementation for goal of 70-100 -     VITAMIN D 25 Hydroxy (Vit-D Deficiency, Fractures)  Obesity (BMI 30.0-34.9) Long discussion about weight loss, diet, and exercise Recommended diet heavy in fruits and veggies and low in animal meats, cheeses, and dairy products, appropriate calorie intake Discussed appropriate weight for height and goal (130 lb) Follow up at next visit  Screening cholesterol level -     Lipid panel  Screening for diabetes mellitus -     Hemoglobin A1c  Screening for hematuria or proteinuria -     Microalbumin / creatinine urine ratio  Medication management -     CBC with Differential/Platelet -     CMP/GFR -     Urinalysis w microscopic + reflex cultur -     Magnesium   Headaches Unclear etiology; she reports well managed by excedrine and promethazine Declines further evaluation or management She will follow up if not resolving   Discussed med's effects and SE's. Screening labs and tests as requested with regular follow-up as recommended. Over 40 minutes of exam, counseling, chart review, and complex, high level critical decision making was performed this visit.   Future Appointments  Date Time Provider [place]  05/12/2019  3:00 PM Charlotte Comber, NP [facility] None    HPI  43 y.o. female  presents for a complete physical and follow up for has Hypothyroid; Vitamin D deficiency;  Obesity (BMI 30.0-34.9); Early dry stage nonexudative age-related macular degeneration of both eyes; and Insomnia on their problem list.   She is married, 1 son, Charlotte Wise in [place]. 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   She is recovering from URI last week; has some residual fatigue/poor appetite but otherwise without symptoms.   She reports she has been having "migraines"; has hx of this prior to 2010; she reports hadn't had in several years, but in the last 2-3 months has had 10 or so headaches. She reports headache character and location is variable, has nausea and some blurry vision but not debilitating. She reports has woken up once with headache, otherwise variable. She takes excedrin and promethazine which seems to resolve headaches well. She has been on daily agent previously. Declines any interventions today, just wanted to mention.   She was previously followed by Charlotte Wise for schizophrenia, and was seeing Charlotte Wise (counsellor) regularly as well, but reports she has tapered off of medications and is doing well. She does not believe the diagnoses was accurate. She has been doing lots of meditation in the evening and feels this has helped more than anything.  Followed annually by Charlotte Wise, for mammograms and PAP smears. She plans to switch providers to Charlotte Wise Dr. Estelle Wise. 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   BMI is Body mass index is  29.93 kg/m., she is working on diet and exercise. She walks most days, does yoga. She does watch her diet, has lost weight previously, plans to get down to 130 lb over the next year or so. Drinks mainly water, aims 75-100 fluid ounces of water daily. 1 cup of coffee in AM.  Wt Readings from Last 3 Encounters:  05/11/18 158 lb 6.4 oz (71.8 kg)   11/04/17 165 lb 9.6 oz (75.1 kg)  05/07/17 186 lb (84.4 kg)   Her blood pressure has been controlled at home (110/70), today their BP is BP: 130/80 She does workout. She denies chest pain, shortness of breath, dizziness.   She is not on cholesterol medication. Her cholesterol is at goal. The cholesterol last visit was:   Lab Results  Component Value Date   CHOL 154 05/07/2017   HDL 54 05/07/2017   LDLCALC 79 05/07/2017   TRIG 114 05/07/2017   CHOLHDL 2.9 05/07/2017    Last A1C in the office was:  Lab Results  Component Value Date   HGBA1C 5.1 05/07/2017   She is on thyroid medication. Her medication was not changed last visit.   Lab Results  Component Value Date   TSH 1.90 02/08/2018   Last GFR: Lab Results  Component Value Date   GFRNONAA 101 11/04/2017   Patient is on Vitamin D supplement and at goal of 70:    Lab Results  Component Value Date   VD25OH 73 11/04/2017      Current Medications:  Current Outpatient Medications on File Prior to Visit  Medication Sig Dispense Refill  . CALCIUM CITRATE PO Take 1,000 mg by mouth.    . Cholecalciferol (VITAMIN D PO) Take 10,000 Units by mouth daily.     . cyclobenzaprine (FLEXERIL) 5 MG tablet Take 1 tablet (5 mg total) by mouth 3 (three) times daily as needed for muscle spasms. 60 tablet 0  . imiquimod (ALDARA) 5 % cream Apply topically 3 (three) times a week.    . levothyroxine (SYNTHROID, LEVOTHROID) 75 MCG tablet Take 1 tablet daily on an empty stomach with only water for 30 minutes & no Antacid meds for 4 hours 90 tablet 0  . magnesium 30 MG tablet Take 30 mg by mouth 2 (two) times daily.    . Multiple Vitamin (MULTIVITAMIN WITH MINERALS) TABS tablet Take 1 tablet by mouth daily.    . Omega-3 Fatty Acids (FISH OIL) 1000 MG CPDR Take by mouth.    . promethazine (PHENERGAN) 25 MG tablet Take every 6 hours as needed for nausea or headache. 30 tablet 0  . temazepam (RESTORIL) 30 MG capsule TAKE 1 CAPSULE(30 MG) BY MOUTH AT  BEDTIME AS NEEDED FOR SLEEP 30 capsule 2  . Turmeric 450 MG CAPS Take by mouth.    . predniSONE (DELTASONE) 20 MG tablet 2 tablets daily for 3 days, 1 tablet daily for 4 days. 10 tablet 0   No current facility-administered medications on file prior to visit.    Allergies:  Allergies  Allergen Reactions  . Hydrocodone Itching  . Oxycodone-Acetaminophen   . 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 Health Maintenance:   Immunization History  Administered Date(s) Administered  . Influenza,inj,quad, With Preservative 01/17/2016   Tetanus: - declines today Flu vaccine: 2017, declines  LMP: No LMP recorded. Patient is perimenopausal. Pap:  By Charlotte - 07/2015, was recommended 5 year follow up  MGM:  By Charlotte - need reports- requested - will schedule   Head CT: 04/18/2016  Last Dental Exam: 2019- goes q6 Last Eye Exam: Annually, has macular degeneration, needs to schedule   Patient Care Team: Unk Pinto, MD as PCP - General (Internal Medicine)  Surgical History:  She has a past surgical history that includes Tonsillectomy and adenoidectomy (1981); Knee arthroscopy (Left, 1988); Knee arthroscopy (Right, 1999); Ankle arthroscopy (Right, 2000); laparoscopy (2001); and Nasal polyp surgery (2006). Family History:  Herfamily history includes Alcohol abuse in her brother, father, and mother; Clotting disorder in her mother; Drug abuse in her brother, father, and mother; Heart disease in her brother and paternal grandfather; Kidney disease in her maternal grandfather; Stroke in her paternal grandfather; Thyroid cancer in her maternal grandmother. Social History:  She reports that she has never smoked. She has never used smokeless tobacco. She reports current alcohol use. She reports that she does not use  drugs.  Review of Systems: Review of Systems  Constitutional: Negative for malaise/fatigue and weight loss.  HENT: Negative for hearing loss and tinnitus.   Eyes: Negative for blurred vision and double vision.  Respiratory: Negative for cough, shortness of breath and wheezing.   Cardiovascular: Negative for chest pain, palpitations, orthopnea, claudication and leg swelling.  Gastrointestinal: Negative for abdominal pain, blood in stool, constipation, diarrhea, heartburn, melena, nausea and vomiting.  Genitourinary: Negative.   Musculoskeletal: Negative for joint pain and myalgias.  Skin: Negative for rash.  Neurological: Positive for headaches. Negative for dizziness, tingling, sensory change and weakness.  Endo/Heme/Allergies: Negative for environmental allergies and polydipsia.  Psychiatric/Behavioral: Negative for depression, hallucinations, memory loss, substance abuse and suicidal ideas. The patient is not nervous/anxious and does not have insomnia.   All other systems reviewed and are negative.   Physical Exam: Estimated body mass index is 29.93 kg/m as calculated from the following:   Height as of this encounter: 5\' 1"  (1.549 m).   Weight as of this encounter: 158 lb 6.4 oz (71.8 kg). BP 130/80   Pulse 85   Temp 97.7 F (36.5 C)   Ht 5\' 1"  (1.549 m)   Wt 158 lb 6.4 oz (71.8 kg)   SpO2 98%   BMI 29.93 kg/m  General Appearance: Well nourished, in no apparent distress.  Eyes: PERRLA, EOMs, conjunctiva no swelling or erythema, normal fundi and vessels.  Sinuses: No Frontal/maxillary tenderness  ENT/Mouth: Ext aud canals clear, normal light reflex with TMs without erythema, bulging. Good dentition. No erythema, swelling, or exudate on post pharynx. Tonsils not swollen or erythematous. Hearing normal.  Neck: Supple, thyroid normal. No bruits  Respiratory: Respiratory effort normal, BS equal bilaterally without rales, rhonchi, wheezing or stridor.  Cardio: RRR without murmurs,  rubs or gallops. Brisk peripheral pulses without edema.  Chest: symmetric, with normal excursions and percussion.  Breasts: Declines, defer to Charlotte Abdomen: Soft, nontender, no guarding, rebound, hernias, masses, or organomegaly.  Lymphatics: Non tender without lymphadenopathy.  Genitourinary: Defer to Charlotte Musculoskeletal: Full ROM all peripheral extremities,5/5 strength, and normal gait.  Skin: Warm, dry without rashes, lesions, ecchymosis. Neuro: Cranial nerves intact, reflexes equal bilaterally. Normal muscle tone, no cerebellar symptoms. Sensation intact.  Psych: Awake and oriented X 3, normal affect, Insight and Judgment fair.   EKG: WNL baseline in chart, defer  Charlotte Wise 3:15 PM [facility]

## 2018-05-11 ENCOUNTER — Encounter: Payer: Self-pay | Admitting: Adult Health

## 2018-05-11 ENCOUNTER — Ambulatory Visit (INDEPENDENT_AMBULATORY_CARE_PROVIDER_SITE_OTHER): Payer: BLUE CROSS/BLUE SHIELD | Admitting: Adult Health

## 2018-05-11 VITALS — BP 130/80 | HR 85 | Temp 97.7°F | Ht 61.0 in | Wt 158.4 lb

## 2018-05-11 DIAGNOSIS — Z0000 Encounter for general adult medical examination without abnormal findings: Secondary | ICD-10-CM

## 2018-05-11 DIAGNOSIS — Z1389 Encounter for screening for other disorder: Secondary | ICD-10-CM | POA: Diagnosis not present

## 2018-05-11 DIAGNOSIS — Z13220 Encounter for screening for lipoid disorders: Secondary | ICD-10-CM | POA: Diagnosis not present

## 2018-05-11 DIAGNOSIS — E669 Obesity, unspecified: Secondary | ICD-10-CM

## 2018-05-11 DIAGNOSIS — Z1329 Encounter for screening for other suspected endocrine disorder: Secondary | ICD-10-CM | POA: Diagnosis not present

## 2018-05-11 DIAGNOSIS — Z79899 Other long term (current) drug therapy: Secondary | ICD-10-CM

## 2018-05-11 DIAGNOSIS — E038 Other specified hypothyroidism: Secondary | ICD-10-CM

## 2018-05-11 DIAGNOSIS — H353131 Nonexudative age-related macular degeneration, bilateral, early dry stage: Secondary | ICD-10-CM

## 2018-05-11 DIAGNOSIS — Z131 Encounter for screening for diabetes mellitus: Secondary | ICD-10-CM

## 2018-05-11 DIAGNOSIS — E559 Vitamin D deficiency, unspecified: Secondary | ICD-10-CM

## 2018-05-11 DIAGNOSIS — G4700 Insomnia, unspecified: Secondary | ICD-10-CM

## 2018-05-11 NOTE — Patient Instructions (Addendum)
Charlotte Wise , Thank you for taking time to come for your Annual Wellness Visit. I appreciate your ongoing commitment to your health goals. Please review the following plan we discussed and let me know if I can assist you in the future.   These are the goals we discussed: Goals    . Exercise 150 min/wk Moderate Activity       This is a list of the screening recommended for you and due dates:  Health Maintenance  Topic Date Due  . Flu Shot  06/22/2018*  . Tetanus Vaccine  05/12/2019*  . HIV Screening  05/12/2019*  . Pap Smear  10/22/2020  *Topic was postponed. The date shown is not the original due date.    Aim for 7+ servings of fruits and vegetables daily  65-80+ fluid ounces of water or unsweet tea for healthy kidneys  Limit to max 1 drink of alcohol per day; avoid smoking/tobacco  Limit animal fats in diet for cholesterol and heart health - choose grass fed whenever available  Avoid highly processed foods, and foods high in saturated/trans fats  Aim for low stress - take time to unwind and care for your mental health  Aim for 150 min of moderate intensity exercise weekly for heart health, and weights twice weekly for bone health  Aim for 7-9 hours of sleep daily     When it comes to diets, agreement about the perfect plan isn't easy to find, even among the experts. Experts at the [employer] developed an idea known as the Healthy Eating Plate. Just imagine a plate divided into logical, healthy portions.  The emphasis is on diet quality:  Load up on vegetables and fruits - one-half of your plate: Aim for color and variety, and remember that potatoes don't count.  Go for whole grains - one-quarter of your plate: Whole wheat, barley, wheat berries, quinoa, oats, brown rice, and foods made with them. If you want pasta, go with whole wheat pasta.  Protein power - one-quarter of your plate: Fish, chicken, beans, and nuts are all healthy, versatile protein  sources. Limit red meat.  The diet, however, does go beyond the plate, offering a few other suggestions.  Use healthy plant oils, such as olive, canola, soy, corn, sunflower and peanut. Check the labels, and avoid partially hydrogenated oil, which have unhealthy trans fats.  If you're thirsty, drink water. Coffee and tea are good in moderation, but skip sugary drinks and limit milk and dairy products to one or two daily servings.  The type of carbohydrate in the diet is more important than the amount. Some sources of carbohydrates, such as vegetables, fruits, whole grains, and beans-are healthier than others.  Finally, stay active.    Mindfulness-Based Stress Reduction Mindfulness-based stress reduction (MBSR) is a program that helps people learn to practice mindfulness. Mindfulness is the practice of intentionally paying attention to the present moment. It can be learned and practiced through techniques such as education, breathing exercises, meditation, and yoga. MBSR includes several mindfulness techniques in one program. MBSR works best when you understand the treatment, are willing to try new things, and can commit to spending time practicing what you learn. MBSR training may include learning about:  How your emotions, thoughts, and reactions affect your body.  New ways to respond to things that cause negative thoughts to start (triggers).  How to notice your thoughts and let go of them.  Practicing awareness of everyday things that you normally do  without thinking.  The techniques and goals of different types of meditation. What are the benefits of MBSR? MBSR can have many benefits, which include helping you to:  Develop self-awareness. This refers to knowing and understanding yourself.  Learn skills and attitudes that help you to participate in your own health care.  Learn new ways to care for yourself.  Be more accepting about how things are, and let things go.  Be less  judgmental and approach things with an open mind.  Be patient with yourself and trust yourself more. MBSR has also been shown to:  Reduce negative emotions, such as depression and anxiety.  Improve memory and focus.  Change how you sense and approach pain.  Boost your body's ability to fight infections.  Help you connect better with other people.  Improve your sense of well-being. Follow these instructions at home:   Find a local in-person or online MBSR program.  Set aside some time regularly for mindfulness practice.  Find a mindfulness practice that works best for you. This may include one or more of the following: ? Meditation. Meditation involves focusing your mind on a certain thought or activity. ? Breathing awareness exercises. These help you to stay present by focusing on your breath. ? Body scan. For this practice, you lie down and pay attention to each part of your body from head to toe. You can identify tension and soreness and intentionally relax parts of your body. ? Yoga. Yoga involves stretching and breathing, and it can improve your ability to move and be flexible. It can also provide an experience of testing your body's limits, which can help you release stress. ? Mindful eating. This way of eating involves focusing on the taste, texture, color, and smell of each bite of food. Because this slows down eating and helps you feel full sooner, it can be an important part of a weight-loss plan.  Find a podcast or recording that provides guidance for breathing awareness, body scan, or meditation exercises. You can listen to these any time when you have a free moment to rest without distractions.  Follow your treatment plan as told by your health care provider. This may include taking regular medicines and making changes to your diet or lifestyle as recommended. How to practice mindfulness To do a basic awareness exercise:  Find a comfortable place to sit.  Pay  attention to the present moment. Observe your thoughts, feelings, and surroundings just as they are.  Avoid placing judgment on yourself, your feelings, or your surroundings. Make note of any judgment that comes up, and let it go.  Your mind may wander, and that is okay. Make note of when your thoughts drift, and return your attention to the present moment. To do basic mindfulness meditation:  Find a comfortable place to sit. This may include a stable chair or a firm floor cushion. ? Sit upright with your back straight. Let your arms fall next to your side with your hands resting on your legs. ? If sitting in a chair, rest your feet flat on the floor. ? If sitting on a cushion, cross your legs in front of you.  Keep your head in a neutral position with your chin dropped slightly. Relax your jaw and rest the tip of your tongue on the roof of your mouth. Drop your gaze to the floor. You can close your eyes if you like.  Breathe normally and pay attention to your breath. Feel the air moving in and  out of your nose. Feel your belly expanding and relaxing with each breath.  Your mind may wander, and that is okay. Make note of when your thoughts drift, and return your attention to your breath.  Avoid placing judgment on yourself, your feelings, or your surroundings. Make note of any judgment or feelings that come up, let them go, and bring your attention back to your breath.  When you are ready, lift your gaze or open your eyes. Pay attention to how your body feels after the meditation. Where to find more information You can find more information about MBSR from:  Your health care provider.  Community-based meditation centers or programs.  Programs offered near you. Summary  Mindfulness-based stress reduction (MBSR) is a program that teaches you how to intentionally pay attention to the present moment. It is used with other treatments to help you cope better with daily stress, emotions, and  pain.  MBSR focuses on developing self-awareness, which allows you to respond to life stress without judgment or negative emotions.  MBSR programs may involve learning different mindfulness practices, such as breathing exercises, meditation, yoga, body scan, or mindful eating. Find a mindfulness practice that works best for you, and set aside time for it on a regular basis. This information is not intended to replace advice given to you by your health care provider. Make sure you discuss any questions you have with your health care provider. Document Released: 07/17/2016 Document Revised: 07/17/2016 Document Reviewed: 07/17/2016 Elsevier Interactive Patient Education  2019 [employer].

## 2018-05-12 ENCOUNTER — Encounter: Payer: Self-pay | Admitting: Adult Health

## 2018-05-12 DIAGNOSIS — E785 Hyperlipidemia, unspecified: Secondary | ICD-10-CM | POA: Insufficient documentation

## 2018-05-12 HISTORY — DX: Hyperlipidemia, unspecified: E78.5

## 2018-05-12 LAB — LIPID PANEL
Cholesterol: 185 mg/dL (ref <200)
HDL: 55 mg/dL (ref >=50)
LDL Cholesterol (Calc): 115 mg/dL (calc) — ABNORMAL HIGH
Non-HDL Cholesterol (Calc): 130 mg/dL (calc) — ABNORMAL HIGH (ref <130)
Total CHOL/HDL Ratio: 3.4 (calc) (ref <5.0)
Triglycerides: 63 mg/dL (ref <150)

## 2018-05-12 LAB — MICROALBUMIN / CREATININE URINE RATIO
Creatinine, Urine: 15 mg/dL — ABNORMAL LOW (ref 20–275)
Microalb, Ur: <0.2 mg/dL

## 2018-05-12 LAB — COMPLETE METABOLIC PANEL WITH GFR
AG Ratio: 2 (calc) (ref 1.0–2.5)
ALT: 16 U/L (ref 6–29)
AST: 20 U/L (ref 10–30)
Albumin: 4.7 g/dL (ref 3.6–5.1)
Alkaline phosphatase (APISO): 51 U/L (ref 31–125)
BUN: 16 mg/dL (ref 7–25)
CO2: 28 mmol/L (ref 20–32)
Calcium: 9.4 mg/dL (ref 8.6–10.2)
Chloride: 100 mmol/L (ref 98–110)
Creat: 0.7 mg/dL (ref 0.50–1.10)
GFR, Est African American: 123 mL/min/{1.73_m2} (ref >=60)
GFR, Est Non African American: 106 mL/min/{1.73_m2} (ref >=60)
Globulin: 2.3 g/dL (calc) (ref 1.9–3.7)
Glucose, Bld: 90 mg/dL (ref 65–99)
Potassium: 4.1 mmol/L (ref 3.5–5.3)
Sodium: 137 mmol/L (ref 135–146)
Total Bilirubin: 0.4 mg/dL (ref 0.2–1.2)
Total Protein: 7 g/dL (ref 6.1–8.1)

## 2018-05-12 LAB — URINALYSIS, ROUTINE W REFLEX MICROSCOPIC
Bilirubin Urine: NEGATIVE
Glucose, UA: NEGATIVE
Hgb urine dipstick: NEGATIVE
Ketones, ur: NEGATIVE
Leukocytes,Ua: NEGATIVE
Nitrite: NEGATIVE
Protein, ur: NEGATIVE
Specific Gravity, Urine: 1.007 (ref 1.001–1.03)
pH: 5.5 (ref 5.0–8.0)

## 2018-05-12 LAB — CBC WITH DIFFERENTIAL/PLATELET
Absolute Monocytes: 429 cells/uL (ref 200–950)
Basophils Absolute: 52 cells/uL (ref 0–200)
Basophils Relative: 0.8 %
Eosinophils Absolute: 182 cells/uL (ref 15–500)
Eosinophils Relative: 2.8 %
HCT: 36.7 % (ref 35.0–45.0)
Hemoglobin: 12.8 g/dL (ref 11.7–15.5)
Lymphs Abs: 2217 cells/uL (ref 850–3900)
MCH: 32.7 pg (ref 27.0–33.0)
MCHC: 34.9 g/dL (ref 32.0–36.0)
MCV: 93.9 fL (ref 80.0–100.0)
MPV: 9.4 fL (ref 7.5–12.5)
Monocytes Relative: 6.6 %
Neutro Abs: 3621 cells/uL (ref 1500–7800)
Neutrophils Relative %: 55.7 %
Platelets: 346 10*3/uL (ref 140–400)
RBC: 3.91 10*6/uL (ref 3.80–5.10)
RDW: 12 % (ref 11.0–15.0)
Total Lymphocyte: 34.1 %
WBC: 6.5 10*3/uL (ref 3.8–10.8)

## 2018-05-12 LAB — HEMOGLOBIN A1C
Hgb A1c MFr Bld: 5.2 % of total Hgb (ref <5.7)
Mean Plasma Glucose: 103 (calc)
eAG (mmol/L): 5.7 (calc)

## 2018-05-12 LAB — VITAMIN D 25 HYDROXY (VIT D DEFICIENCY, FRACTURES): Vit D, 25-Hydroxy: 76 ng/mL (ref 30–100)

## 2018-05-12 LAB — MAGNESIUM: Magnesium: 2 mg/dL (ref 1.5–2.5)

## 2018-05-12 LAB — TSH: TSH: 0.78 mIU/L

## 2018-05-17 DIAGNOSIS — Z01411 Encounter for gynecological examination (general) (routine) with abnormal findings: Secondary | ICD-10-CM | POA: Diagnosis not present

## 2018-05-17 DIAGNOSIS — B977 Papillomavirus as the cause of diseases classified elsewhere: Secondary | ICD-10-CM | POA: Diagnosis not present

## 2018-05-17 DIAGNOSIS — R102 Pelvic and perineal pain: Secondary | ICD-10-CM | POA: Diagnosis not present

## 2018-05-21 ENCOUNTER — Other Ambulatory Visit: Payer: Self-pay | Admitting: Adult Health

## 2018-06-01 DIAGNOSIS — M545 Low back pain: Secondary | ICD-10-CM | POA: Diagnosis not present

## 2018-06-10 DIAGNOSIS — M545 Low back pain: Secondary | ICD-10-CM | POA: Diagnosis not present

## 2018-06-17 DIAGNOSIS — M545 Low back pain: Secondary | ICD-10-CM | POA: Diagnosis not present

## 2018-07-16 ENCOUNTER — Other Ambulatory Visit: Payer: Self-pay | Admitting: Adult Health

## 2018-07-16 MED ORDER — DOXYCYCLINE HYCLATE 100 MG PO TABS: 100.0000 mg | ORAL_TABLET | Freq: Two times a day (BID) | ORAL | 0 refills | Status: AC

## 2018-07-22 ENCOUNTER — Other Ambulatory Visit: Payer: Self-pay | Admitting: Physician Assistant

## 2018-07-23 ENCOUNTER — Other Ambulatory Visit: Payer: Self-pay | Admitting: Internal Medicine

## 2018-07-23 DIAGNOSIS — E039 Hypothyroidism, unspecified: Secondary | ICD-10-CM

## 2018-07-23 MED ORDER — LEVOTHYROXINE SODIUM 75 MCG PO TABS: ORAL_TABLET | ORAL | 1 refills | Status: DC

## 2018-08-04 ENCOUNTER — Encounter: Payer: Self-pay | Admitting: Adult Health

## 2018-08-04 ENCOUNTER — Other Ambulatory Visit: Payer: Self-pay

## 2018-08-04 ENCOUNTER — Ambulatory Visit: Payer: BLUE CROSS/BLUE SHIELD | Admitting: Adult Health

## 2018-08-04 VITALS — BP 138/80 | HR 80 | Temp 97.0°F | Ht 61.0 in | Wt 158.0 lb

## 2018-08-04 DIAGNOSIS — R51 Headache: Secondary | ICD-10-CM | POA: Diagnosis not present

## 2018-08-04 DIAGNOSIS — R519 Headache, unspecified: Secondary | ICD-10-CM

## 2018-08-04 MED ORDER — ATENOLOL 50 MG PO TABS: ORAL_TABLET | ORAL | 1 refills | Status: DC

## 2018-08-04 MED ORDER — SUCRALFATE 1 G PO TABS: 1.0000 g | ORAL_TABLET | Freq: Four times a day (QID) | ORAL | 1 refills | Status: DC

## 2018-08-04 MED ORDER — CYCLOBENZAPRINE HCL 5 MG PO TABS: 5.0000 mg | ORAL_TABLET | Freq: Three times a day (TID) | ORAL | 0 refills | Status: DC | PRN

## 2018-08-04 NOTE — Patient Instructions (Addendum)
Take 2 tabs of flexeril (cyclobenzaprine) with 1/2 tab of atenolol for 1 week, then try increasing to full tab of atenolol  Work on relaxing exercises  Follow up with [facility] for dry needling or medical massage - mention concern for tension headache  Try magnesium salt baths  Relaxing aroma oil therapy   Try relaxing activities in the evening - music, etc  Avoid meditation if causing more tension     Tension Headache, Adult A tension headache is a feeling of pain, pressure, or aching in the head that is often felt over the front and sides of the head. The pain can be dull, or it can feel tight (constricting). There are two types of tension headache:  Episodic tension headache. This is when the headaches happen fewer than 15 days a month.  Chronic tension headache. This is when the headaches happen more than 15 days a month during a 62-month period. A tension headache can last from 30 minutes to several days. It is the most common kind of headache. Tension headaches are not normally associated with nausea or vomiting, and they do not get worse with physical activity. What are the causes? The exact cause of this condition is not known. Tension headaches are often triggered by stress, anxiety, or depression. Other triggers include:  Alcohol.  Too much caffeine or caffeine withdrawal.  Respiratory infections, such as colds, flu, or sinus infections.  Dental problems or teeth clenching.  Tiredness (fatigue).  Holding your head and neck in the same position for a long period of time, such as while using a computer.  Smoking.  Arthritis of the neck. What are the signs or symptoms? Symptoms of this condition include:  A feeling of pressure or tightness around the head.  Dull, aching head pain.  Pain over the front and sides of the head.  Tenderness in the muscles of the head, neck, and shoulders. How is this diagnosed? This condition may be diagnosed based on your symptoms,  your medical history, and a physical exam. If your symptoms are severe or unusual, you may have imaging tests, such as a CT scan or an MRI of your head. Your vision may also be checked. How is this treated? This condition may be treated with lifestyle changes and with medicines that help relieve symptoms. Follow these instructions at home: Managing pain  Take over-the-counter and prescription medicines only as told by your health care provider.  When you have a headache, lie down in a dark, quiet room.  If directed, apply ice to the head and neck: ? Put ice in a plastic bag. ? Place a towel between your skin and the bag. ? Leave the ice on for 20 minutes, 2-3 times a day.  If directed, apply heat to the back of your neck as often as told by your health care provider. Use the heat source that your health care provider recommends, such as a moist heat pack or a heating pad. ? Place a towel between your skin and the heat source. ? Leave the heat on for 20-30 minutes. ? Remove the heat if your skin turns bright red. This is especially important if you are unable to feel pain, heat, or cold. You may have a greater risk of getting burned. Eating and drinking  Eat meals on a regular schedule.  Limit alcohol intake to no more than 1 drink a day for nonpregnant women and 2 drinks a day for men. One drink equals 12 oz of beer,  5 oz of wine, or 1 oz of hard liquor.  Drink enough fluid to keep your urine pale yellow.  Decrease your caffeine intake, or stop using caffeine. Lifestyle  Get 7-9 hours of sleep each night, or get the amount of sleep recommended by your health care provider.  At bedtime, remove all electronic devices from your room. Electronic devices include computers, phones, and tablets.  Find ways to manage your stress. Some things that can help relieve stress include: ? Exercise. ? Deep breathing exercises. ? Yoga. ? Listening to music. ? Positive mental imagery.  Try to  sit up straight and avoid tensing your muscles.  Do not use any products that contain nicotine or tobacco, such as cigarettes and e-cigarettes. If you need help quitting, ask your health care provider. General instructions   Keep all follow-up visits as told by your health care provider. This is important.  Avoid any headache triggers. Keep a headache journal to help find out what may trigger your headaches. For example, write down: ? What you eat and drink. ? How much sleep you get. ? Any change to your diet or medicines. Contact a health care provider if:  Your headache does not get better.  Your headache comes back.  You are sensitive to sounds, light, or smells because of a headache.  You have nausea or you vomit.  Your stomach hurts. Get help right away if:  You suddenly develop a very severe headache along with any of the following: ? A stiff neck. ? Nausea and vomiting. ? Confusion. ? Weakness. ? Double vision or loss of vision. ? Shortness of breath. ? Rash. ? Unusual sleepiness. ? Fever. ? Trouble speaking. ? Pain in your eyes or ears. ? Trouble walking or balancing. ? Feeling faint or passing out. Summary  A tension headache is a feeling of pain, pressure, or aching in the head that is often felt over the front and sides of the head.  A tension headache can last from 30 minutes to several days. It is the most common kind of headache.  This condition may be diagnosed based on your symptoms, your medical history, and a physical exam.  This condition may be treated with lifestyle changes and with medicines that help relieve symptoms. This information is not intended to replace advice given to you by your health care provider. Make sure you discuss any questions you have with your health care provider. Document Released: 03/10/2005 Document Revised: 06/20/2016 Document Reviewed: 06/20/2016 Elsevier Interactive Patient Education  2019 Else[employer]    Atenolol tablets What is this medicine? ATENOLOL (a TEN oh lole) is a beta-blocker. Beta-blockers reduce the workload on the heart and help it to beat more regularly. This medicine is used to treat high blood pressure and to prevent chest pain. It is also used to protect the heart during a heart attack and to prevent an additional heart attack from occurring. This medicine may be used for other purposes; ask your health care provider or pharmacist if you have questions. COMMON BRAND NAME(S): Tenormin What should I tell my health care provider before I take this medicine? They need to know if you have any of these conditions: -diabetes -heart or vessel disease like slow heart rate, worsening heart failure, heart block, sick sinus syndrome or Raynaud's disease -kidney disease -lung or breathing disease, like asthma or emphysema -pheochromocytoma -thyroid disease -an unusual or allergic reaction to atenolol, other beta-blockers, medicines, foods, dyes, or preservatives -pregnant or trying to  get pregnant -breast-feeding How should I use this medicine? Take this medicine by mouth with a drink of water. Follow the directions on the prescription label. This medicine may be taken with or without food. Take your medicine at regular intervals. Do not take more medicine than directed. Do not stop taking this medicine suddenly. This could lead to serious heart-related effects. Talk to your pediatrician regarding the use of this medicine in children. Special care may be needed. Overdosage: If you think you have taken too much of this medicine contact a poison control center or emergency room at once. NOTE: This medicine is only for you. Do not share this medicine with others. What if I miss a dose? If you miss a dose, take it as soon as you can. If it is almost time for your next dose, take only that dose. Do not take double or extra doses. What may interact with this medicine? This medicine  may interact with the following medications: -certain medicines for blood pressure, heart disease, irregular heart beat -clonidine -digoxin -diuretics -dobutamine -epinephrine -isoproterenol -NSAIDs, medicines for pain and inflammation, like ibuprofen or naproxen -reserpine This list may not describe all possible interactions. Give your health care provider a list of all the medicines, herbs, non-prescription drugs, or dietary supplements you use. Also tell them if you smoke, drink alcohol, or use illegal drugs. Some items may interact with your medicine. What should I watch for while using this medicine? Visit your doctor or health care professional for regular check ups. Check your blood pressure and pulse rate regularly. Ask your health care professional what your blood pressure and pulse rate should be, and when you should contact him or her. You may get drowsy or dizzy. Do not drive, use machinery, or do anything that needs mental alertness until you know how this medicine affects you. Do not stand or sit up quickly. Alcohol may interfere with the effect of this medicine. Avoid alcoholic drinks. This medicine can affect blood sugar levels. If you have diabetes, check with your doctor or health care professional before you change your diet or the dose of your diabetic medicine. Do not treat yourself for coughs, colds, or pain while you are taking this medicine without asking your doctor or health care professional for advice. Some ingredients may increase your blood pressure. What side effects may I notice from receiving this medicine? Side effects that you should report to your doctor or health care professional as soon as possible: -allergic reactions like skin rash, itching or hives, swelling of the face, lips, or tongue -breathing problems -changes in vision -chest pain -cold, tingling, or numb hands or feet -depression -fast, irregular heartbeat -feeling faint or lightheaded,  falls -fever with sore throat -rapid weight gain -swollen ankles, legs Side effects that usually do not require medical attention (report to your doctor or health care professional if they continue or are bothersome): -anxiety, nervous -diarrhea -dry skin -change in sex drive or performance -headache -nightmares or trouble sleeping -short term memory loss -stomach upset -unusually tired This list may not describe all possible side effects. Call your doctor for medical advice about side effects. You may report side effects to FDA at 1-800-FDA-1088. Where should I keep my medicine? Keep out of the reach of children. Store at room temperature between 20 and 25 degrees C (68 and 77 degrees F). Close tightly and protect from light. Throw away any unused medicine after the expiration date. NOTE: This sheet is a summary. It may  not cover all possible information. If you have questions about this medicine, talk to your doctor, pharmacist, or health care provider.  2019 Elsevier/[employer] (2012-11-14 14:21:28)

## 2018-08-04 NOTE — Progress Notes (Signed)
Assessment and Plan:  Charlotte Wise was seen today for headache.  Diagnoses and all orders for this visit:  Frequent headaches Patient with some mental health history, difficult to interview Neuro exam normal today Headache suggestive of tension type headache, ?stress/anxiety contributing though cannot rule out other etiology; discussed possible MRI to further investigate, rule out mass and other intracranial etiology due to new onset frequent headaches which she declines at this time Advised she stop "meditation" Follow up with ?PT provider for dry needling With migraine hx, mildly hypertensive recently will initiate low dose BB for anxiety/headache prophylaxis Flexeril 5-10 mg in the evening Push fluids Stress management techniques discussed, increase water, good sleep hygiene discussed, increase exercise Strategies for management of tension type headaches reviewed Follow up with vision provider for fundal exam which was limited today Follow up in 2-3 weeks or sooner if needed The patient was advised to call immediately if she has any concerning symptoms in the interval. The patient voices understanding of current treatment options and is in agreement with the current care plan.The patient knows to call the clinic with any problems, questions or concerns or go to the ER if any further progression of symptoms.  -     atenolol (TENORMIN) 50 MG tablet; Take 1/2 - 1 tab daily for blood pressure and headache prevention. -     cyclobenzaprine (FLEXERIL) 5 MG tablet; Take 1 tablet (5 mg total) by mouth 3 (three) times daily as needed for muscle spasms.  Other orders -     sucralfate (CARAFATE) 1 g tablet; Take 1 tablet (1 g total) by mouth 4 (four) times daily.  Further disposition pending results of labs. Discussed med's effects and SE's.   Over 30 minutes of exam, counseling, chart review, and critical decision making was performed.   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   She reports hadn't had in several years, but since the new year she has had new frequent headaches. Not typically unilateral. She reports headache character and location is variable, has nausea and some blurry vision but not debilitating. She reports pain does feel somewhat superficial, commonly above eyes, questionably agrees to feeling like band around head. She reports has woken up once with headache, but typically has in the evenings. She wears glasses, but hasn't had vision checked since onset of headaches. Describes pain as non-radiating, "tender" but very vague in her description despite extensive interview.   She takes excedrin and promethazine which seems to resolve headaches well, but seems to have some GI upset with excedrine.   She has been on daily agent previously for migraines but can't remember what.    She associates headaches with her "meditation" sessions - apparently has been spending many hours meditating but rather than focusing on relaxing is doing aggressive introspection that appears stressful to her. She is quite anxious and difficult to interview today.   Today she recalls similar episode  associated with intense medication a few years ago, resolved when she stopped meditating so much but hasn't seemed to resolve headaches this time.   She had normal CT head without contrast in 03/2016  -  She is established with ? PT who has done dry needling in the past  BMI is Body mass index is 29.85 kg/m. Wt Readings from Last 3 Encounters:  08/04/18 158 lb (71.7 kg)  05/11/18 158 lb 6.4 oz (71.8 kg)  11/04/17 165 lb 9.6 oz (75.1 kg)     Past Medical History:  Diagnosis Date  . Allergy   . Anxiety   . Arthritis   . Hyperlipidemia 05/12/2018  . 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 Thyroid disease      Allergies  Allergen Reactions  . Hydrocodone Itching  . Oxycodone-Acetaminophen   . Penicillins Swelling    Current Outpatient Medications on File Prior to Visit  Medication Sig  . CALCIUM CITRATE PO Take 1,000 mg by mouth.  . Cholecalciferol (VITAMIN D PO) Take 10,000 Units by mouth daily.   . cyclobenzaprine (FLEXERIL) 5 MG tablet Take 1 tablet (5 mg total) by mouth 3 (three) times daily as needed for muscle spasms.  Marland Kitchen levothyroxine (SYNTHROID) 75 MCG tablet Take 1 tablet daily on an empty stomach with only water for 30 minutes & no Antacid meds, Calcium or Magnesium for 4 hours & avoid Biotin  . magnesium 30 MG tablet Take 30 mg by mouth 2 (two) times daily.  . Multiple Vitamin (MULTIVITAMIN WITH MINERALS) TABS tablet Take 1 tablet by mouth daily.  . Omega-3 Fatty Acids (FISH OIL) 1000 MG CPDR Take by mouth.  . promethazine (PHENERGAN) 25 MG tablet TAKE 1 TABLET BY MOUTH EVERY 6 HOURS AS NEEDED FOR NAUSEA OR HEADACHE  . temazepam (RESTORIL) 30 MG capsule TAKE 1 CAPSULE(30 MG) BY MOUTH AT BEDTIME AS NEEDED FOR SLEEP  . Turmeric 450 MG CAPS Take by mouth.  . imiquimod (ALDARA) 5 % cream Apply topically 3 (three) times a week.   No current facility-administered medications on file prior to visit.     ROS: Review of Systems  Constitutional: Negative for chills, diaphoresis, fever, malaise/fatigue and weight loss.  HENT: Negative for congestion, ear pain, hearing loss, sinus pain, sore throat and tinnitus.   Eyes:  Positive for blurred vision (intermittently with headaches). Negative for double vision, photophobia, pain and redness.  Respiratory: Negative for cough and shortness of breath.   Cardiovascular: Negative for chest pain, palpitations and leg swelling.  Gastrointestinal: Positive for abdominal pain (mild with excedrine). Negative for blood in stool, constipation, diarrhea, heartburn, melena, nausea and vomiting.  Genitourinary: Negative.   Musculoskeletal: Negative for back pain, falls, joint pain, myalgias and neck pain.  Skin: Negative for rash.  Neurological: Positive for headaches. Negative for dizziness, tingling, tremors, sensory change, speech change, focal weakness, seizures and weakness.  Endo/Heme/Allergies: Negative for environmental allergies and polydipsia.  Psychiatric/Behavioral: Negative for depression, hallucinations, memory loss, substance abuse and suicidal ideas. The patient is nervous/anxious and has insomnia.     Physical Exam:  BP 138/80   Pulse 80   Temp (!) 97 F (36.1 C)   Ht 5\' 1"  (1.549 m)   Wt 158 lb (71.7 kg)   SpO2 99%   BMI 29.85 kg/m   General Appearance: Well nourished, in no acute distress. Eyes: PERRLA, EOMs, conjunctiva no swelling or erythema. Fundi: poorly visualized though no obvious papilledema or hemorrhages ENT/Mouth: Ext aud  canals clear, TMs without erythema, bulging. No erythema, swelling, or exudate on post pharynx.  Tonsils not swollen or erythematous. Hearing normal.  Neck: Supple, thyroid normal.  Respiratory: Respiratory effort normal, BS equal bilaterally without rales, rhonchi, wheezing or stridor.  Cardio: RRR with no MRGs. Brisk peripheral pulses without edema.  Abdomen: Soft, + BS.  Non tender, no guarding, rebound, hernias, masses. Lymphatics: Non tender without lymphadenopathy.  Musculoskeletal: Symmetrical strength, normal gait.  Skin: Warm, dry without rashes, lesions, ecchymosis.  Neuro: Cranial nerves intact. Normal muscle  tone, no cerebellar symptoms. Sensation intact.  Psych: Awake and oriented X 3, anxious affect, fidgeting, Insight and Judgment fair, though processes reasonable    Izora Ribas, NP 1:27 PM [facility]

## 2018-09-09 ENCOUNTER — Other Ambulatory Visit: Payer: Self-pay | Admitting: Adult Health

## 2018-09-20 DIAGNOSIS — Z30011 Encounter for initial prescription of contraceptive pills: Secondary | ICD-10-CM | POA: Diagnosis not present

## 2018-09-20 DIAGNOSIS — Z124 Encounter for screening for malignant neoplasm of cervix: Secondary | ICD-10-CM | POA: Diagnosis not present

## 2018-10-20 ENCOUNTER — Ambulatory Visit: Payer: BLUE CROSS/BLUE SHIELD | Admitting: Adult Health

## 2018-10-20 ENCOUNTER — Other Ambulatory Visit: Payer: Self-pay

## 2018-10-20 ENCOUNTER — Encounter: Payer: Self-pay | Admitting: Adult Health

## 2018-10-20 VITALS — BP 124/80 | HR 72 | Temp 97.9°F | Ht 61.0 in | Wt 160.0 lb

## 2018-10-20 DIAGNOSIS — M79642 Pain in left hand: Secondary | ICD-10-CM

## 2018-10-20 DIAGNOSIS — E785 Hyperlipidemia, unspecified: Secondary | ICD-10-CM

## 2018-10-20 DIAGNOSIS — E669 Obesity, unspecified: Secondary | ICD-10-CM

## 2018-10-20 DIAGNOSIS — M25541 Pain in joints of right hand: Secondary | ICD-10-CM | POA: Diagnosis not present

## 2018-10-20 DIAGNOSIS — E559 Vitamin D deficiency, unspecified: Secondary | ICD-10-CM

## 2018-10-20 DIAGNOSIS — E038 Other specified hypothyroidism: Secondary | ICD-10-CM | POA: Diagnosis not present

## 2018-10-20 DIAGNOSIS — R233 Spontaneous ecchymoses: Secondary | ICD-10-CM

## 2018-10-20 DIAGNOSIS — E66811 Obesity, class 1: Secondary | ICD-10-CM

## 2018-10-20 DIAGNOSIS — M79641 Pain in right hand: Secondary | ICD-10-CM | POA: Diagnosis not present

## 2018-10-20 DIAGNOSIS — Z832 Family history of diseases of the blood and blood-forming organs and certain disorders involving the immune mechanism: Secondary | ICD-10-CM

## 2018-10-20 DIAGNOSIS — M25542 Pain in joints of left hand: Secondary | ICD-10-CM | POA: Diagnosis not present

## 2018-10-20 DIAGNOSIS — Z79899 Other long term (current) drug therapy: Secondary | ICD-10-CM

## 2018-10-20 DIAGNOSIS — R238 Other skin changes: Secondary | ICD-10-CM

## 2018-10-20 NOTE — Progress Notes (Signed)
Assessment and Plan:  Hypothyroidism, unspecified type continue medications the same pending lab results reminded to take on an empty stomach 30-8mins before food.  -     TSH  Vitamin D deficiency At goal at recent check; continue to recommend supplementation for goal of 70-100 Defer vitamin D level  Obesity (BMI 30.0-34.9) Long discussion about weight loss, diet, and exercise Recommended diet heavy in fruits and veggies and low in animal meats, cheeses, and dairy products, appropriate calorie intake  Discussed appropriate weight for height and goal (130 lb) Follow up at next visit  Easy bruising/Pain of both hands/ ? History of lupus in mother Non-specific symptoms; exam appears normal, denies "numbness"  Discussed monitoring, will r/o clotting issue due with plt recheck, PT/INR though low suspicion as no other signs of bleeding Will check basic inflammation and ANA labs due to ? Hx of lupus anticoag history in mother though she is unsure of this, limited workup as no other signs suggestive of lupus Patient is very anxious, may have her follow up with Dr. Jerilynn Mages if persistent with unclear etiology for his input  Patient will monitor and follow up if progressive or any changes Suggested adding an antiinflammatory PRN  -     CBC with Differential/Platelet -     Protime-INR -     C-reactive protein -     Sedimentation rate -     ANA  Medication management -     COMPLETE METABOLIC PANEL WITH GFR   Further disposition pending results of labs. Discussed med's effects and SE's.   Over 30 minutes of exam, counseling, chart review, and critical decision making was performed.   Future Appointments  Date Time Provider [place]  05/12/2019  3:00 PM Liane Comber, NP [facility] None    ------------------------------------------------------------------------------------------------------------------   HPI BP 124/80   Pulse 72   Temp 97.9 F (36.6 C)   Ht 5\' 1"  (1.549 m)    Wt 160 lb (72.6 kg)   SpO2 98%   BMI 30.23 kg/m   43 y.o.female with hx of arthritis, schizophreniform disorder, depression/anxiety presents for evaluation of concerns with tenderness in hands and easy bruising. 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 No longer working there, but since has noted she frequently has broken capillaries in fingers with minimal impact (opening fridge door, etc) and vague "swollen" sensation; denies localized tenderness, swelling, redness, heat/cold fingers, turning red/blue/white, burning, numbnss. She denies other excess bruising or bleeding. No bruising to torso, bleeding from gums or blood in urine or stool. Denies joint pains. Denies rash. Denies fatigue, fever/chills. Hasn't taken any OTC agents for this.   Mother ? had lupus anticoagulant antibody;   BMI is Body mass index is 30.23 kg/m., she has been working on diet and exercise. Wt Readings from Last 3 Encounters:  10/20/18 160 lb (72.6 kg)  08/04/18 158 lb (71.7 kg)  05/11/18 158 lb 6.4 oz (71.8 kg)   Her blood pressure has been controlled at home, today their BP is BP: 124/80  She does workout. She denies chest pain, shortness of breath, dizziness.   She is not on cholesterol medication and denies myalgias. Her cholesterol is not at goal. The cholesterol last visit was:   Lab Results  Component Value Date   CHOL 185 05/11/2018   HDL 55 05/11/2018   LDLCALC 115 (H) 05/11/2018   TRIG 63 05/11/2018  CHOLHDL 3.4 05/11/2018   Last A1C in the office was:  Lab Results  Component Value Date   HGBA1C 5.2 05/11/2018   Patient is on Vitamin D supplement.   Lab Results  Component Value Date   VD25OH 76 05/11/2018       Past Medical History:  Diagnosis Date  . Allergy   . Anxiety   . Arthritis   . Hyperlipidemia 05/12/2018  . Major depressive disorder, recurrent episode with anxious  distress ([place]) 04/15/2016  . Thyroid disease      Allergies  Allergen Reactions  . Hydrocodone Itching  . Oxycodone-Acetaminophen   . Penicillins Swelling    Current Outpatient Medications on File Prior to Visit  Medication Sig  . CALCIUM CITRATE PO Take 1,000 mg by mouth.  . Cholecalciferol (VITAMIN D PO) Take 10,000 Units by mouth daily.   Marland Kitchen levothyroxine (SYNTHROID) 75 MCG tablet Take 1 tablet daily on an empty stomach with only water for 30 minutes & no Antacid meds, Calcium or Magnesium for 4 hours & avoid Biotin  . magnesium 30 MG tablet Take 30 mg by mouth 2 (two) times daily.  . Multiple Vitamin (MULTIVITAMIN WITH MINERALS) TABS tablet Take 1 tablet by mouth daily.  . Omega-3 Fatty Acids (FISH OIL) 1000 MG CPDR Take by mouth.  . promethazine (PHENERGAN) 25 MG tablet TAKE 1 TABLET BY MOUTH EVERY 6 HOURS AS NEEDED FOR NAUSEA OR HEADACHE  . sucralfate (CARAFATE) 1 g tablet Take 1 tablet (1 g total) by mouth 4 (four) times daily. (Patient taking differently: Take 1 g by mouth 4 (four) times daily. Doesn't take consistently)  . temazepam (RESTORIL) 30 MG capsule TAKE 1 CAPSULE(30 MG) BY MOUTH AT BEDTIME AS NEEDED FOR SLEEP  . Turmeric 450 MG CAPS Take by mouth.  . cyclobenzaprine (FLEXERIL) 5 MG tablet Take 1 tablet (5 mg total) by mouth 3 (three) times daily as needed for muscle spasms. (Patient not taking: Reported on 10/20/2018)  . imiquimod (ALDARA) 5 % cream Apply topically 3 (three) times a week.   No current facility-administered medications on file prior to visit.     ROS: all negative except above.   Physical Exam:  BP 124/80   Pulse 72   Temp 97.9 F (36.6 C)   Ht 5\' 1"  (1.549 m)   Wt 160 lb (72.6 kg)   SpO2 98%   BMI 30.23 kg/m   General Appearance: Well nourished, in no apparent distress. Eyes: PERRLA, EOMs, conjunctiva no swelling or erythema Sinuses: No Frontal/maxillary tenderness ENT/Mouth: Ext aud canals clear, TMs without erythema, bulging. No  erythema, swelling, or exudate on post pharynx.  Tonsils not swollen or erythematous. Hearing normal.  Neck: Supple, thyroid normal.  Respiratory: Respiratory effort normal, BS equal bilaterally without rales, rhonchi, wheezing or stridor.  Cardio: RRR with no MRGs. Brisk peripheral pulses without edema. Allen's test negative bilateral hands. Abdomen: Soft, + BS.  Non tender, no guarding, rebound, hernias, masses. Lymphatics: Non tender without lymphadenopathy.  Musculoskeletal: Full ROM, 5/5 strength, normal gait. Phalen's negative. Bilateral hands without edema, erythema, joint tenderness or effusion. No "sausage finger" appearance, normal coloring Skin: Warm, dry without rashes, lesions, ecchymosis.  Neuro: Cranial nerves intact. Normal muscle tone, no cerebellar symptoms. Sensation intact.  Psych: Awake and oriented X 3, somewhat anxious affect, Insight and Judgment fair     Izora Ribas, NP 5:09 PM [facility]

## 2018-10-20 NOTE — Patient Instructions (Signed)
If ANA, CRP, ESR (inflammation tests) or PT/INR (clotting test) are abnormal we can order follow up tests as indicated   Suggest you look into a low-inflammation diet/antiinflammatory diet  And suggest you try getting back on tumeric/tart cherry supplements - natural antiinflammatories   Antinuclear Antibody Test Why am I having this test? This is a test that is used to help diagnose systemic lupus erythematosus (SLE) and other autoimmune diseases. An autoimmune disease is a disease in which the body's own defense (immune)system attacks its organs. What is being tested? This test checks for antinuclear antibodies (ANA) in the blood. The presence of ANA is associated with several autoimmune diseases. It is seen in almost all patients with lupus. What kind of sample is taken?  A blood sample is required for this test. It is usually collected by inserting a needle into a blood vessel. How are the results reported? Your test results will be reported as either positive or negative. A false-positive result can occur. A false positive is incorrect because it means that a condition is present when it is not. What do the results mean? A positive test result may mean that you have:  Lupus.  Other autoimmune diseases, such as rheumatoid arthritis, scleroderma, or Sjgren syndrome. Conditions that may cause a false-positive result include:  Liver dysfunction.  Myasthenia gravis.  Infectious mononucleosis. Talk with your health care provider about what your results mean. Questions to ask your health care provider Ask your health care provider, or the department that is doing the test:  When will my results be ready?  How will I get my results?  What are my treatment options?  What other tests do I need?  What are my next steps? Summary  This is a test that is used to help diagnose systemic lupus erythematosus (SLE) and other autoimmune diseases. An autoimmune disease is a disease in  which the body's own defense (immune)system attacks the body.  This test checks for antinuclear antibodies (ANA) in the blood. The presence of ANA is associated with several autoimmune diseases. It is seen in almost all patients with lupus.  Your test results will be reported as either positive or negative. Talk with your health care provider about what your results mean. This information is not intended to replace advice given to you by your health care provider. Make sure you discuss any questions you have with your health care provider. Document Released: 04/01/2004 Document Revised: 02/20/2017 Document Reviewed: 11/06/2016 Elsevier Patient Education  2020 [employer].

## 2018-10-21 ENCOUNTER — Other Ambulatory Visit: Payer: Self-pay

## 2018-10-21 DIAGNOSIS — E039 Hypothyroidism, unspecified: Secondary | ICD-10-CM

## 2018-10-21 LAB — C-REACTIVE PROTEIN: CRP: 0.5 mg/L (ref <8.0)

## 2018-10-21 MED ORDER — LEVOTHYROXINE SODIUM 75 MCG PO TABS: ORAL_TABLET | ORAL | 1 refills | Status: DC

## 2018-10-22 LAB — COMPLETE METABOLIC PANEL WITH GFR
AG Ratio: 1.9 (calc) (ref 1.0–2.5)
ALT: 15 U/L (ref 6–29)
AST: 17 U/L (ref 10–30)
Albumin: 4.6 g/dL (ref 3.6–5.1)
Alkaline phosphatase (APISO): 52 U/L (ref 31–125)
BUN: 20 mg/dL (ref 7–25)
CO2: 29 mmol/L (ref 20–32)
Calcium: 9.9 mg/dL (ref 8.6–10.2)
Chloride: 101 mmol/L (ref 98–110)
Creat: 0.75 mg/dL (ref 0.50–1.10)
GFR, Est African American: 112 mL/min/{1.73_m2} (ref >=60)
GFR, Est Non African American: 97 mL/min/{1.73_m2} (ref >=60)
Globulin: 2.4 g/dL (calc) (ref 1.9–3.7)
Glucose, Bld: 101 mg/dL — ABNORMAL HIGH (ref 65–99)
Potassium: 4.7 mmol/L (ref 3.5–5.3)
Sodium: 139 mmol/L (ref 135–146)
Total Bilirubin: 0.6 mg/dL (ref 0.2–1.2)
Total Protein: 7 g/dL (ref 6.1–8.1)

## 2018-10-22 LAB — CBC WITH DIFFERENTIAL/PLATELET
Absolute Monocytes: 506 cells/uL (ref 200–950)
Basophils Absolute: 67 cells/uL (ref 0–200)
Basophils Relative: 1.1 %
Eosinophils Absolute: 201 cells/uL (ref 15–500)
Eosinophils Relative: 3.3 %
HCT: 39.1 % (ref 35.0–45.0)
Hemoglobin: 13.2 g/dL (ref 11.7–15.5)
Lymphs Abs: 2208 cells/uL (ref 850–3900)
MCH: 32.7 pg (ref 27.0–33.0)
MCHC: 33.8 g/dL (ref 32.0–36.0)
MCV: 96.8 fL (ref 80.0–100.0)
MPV: 9.4 fL (ref 7.5–12.5)
Monocytes Relative: 8.3 %
Neutro Abs: 3117 cells/uL (ref 1500–7800)
Neutrophils Relative %: 51.1 %
Platelets: 359 10*3/uL (ref 140–400)
RBC: 4.04 10*6/uL (ref 3.80–5.10)
RDW: 12.8 % (ref 11.0–15.0)
Total Lymphocyte: 36.2 %
WBC: 6.1 10*3/uL (ref 3.8–10.8)

## 2018-10-22 LAB — LIPID PANEL
Cholesterol: 222 mg/dL — ABNORMAL HIGH (ref <200)
HDL: 64 mg/dL (ref >=50)
LDL Cholesterol (Calc): 132 mg/dL (calc) — ABNORMAL HIGH
Non-HDL Cholesterol (Calc): 158 mg/dL (calc) — ABNORMAL HIGH (ref <130)
Total CHOL/HDL Ratio: 3.5 (calc) (ref <5.0)
Triglycerides: 146 mg/dL (ref <150)

## 2018-10-22 LAB — ANA: Anti Nuclear Antibody (ANA): NEGATIVE

## 2018-10-22 LAB — PROTIME-INR
INR: 1
Prothrombin Time: 10.4 s (ref 9.0–11.5)

## 2018-10-22 LAB — TSH: TSH: 2.46 mIU/L

## 2018-10-22 LAB — SEDIMENTATION RATE: Sed Rate: 6 mm/h (ref 0–20)

## 2018-10-25 ENCOUNTER — Other Ambulatory Visit: Payer: Self-pay | Admitting: Obstetrics & Gynecology

## 2018-10-25 DIAGNOSIS — Z1231 Encounter for screening mammogram for malignant neoplasm of breast: Secondary | ICD-10-CM

## 2018-10-27 ENCOUNTER — Other Ambulatory Visit: Payer: Self-pay

## 2018-10-27 ENCOUNTER — Ambulatory Visit
Admission: RE | Admit: 2018-10-27 | Discharge: 2018-10-27 | Disposition: A | Discharge status: Home / Self Care | Payer: BC Managed Care – PPO | Source: Ambulatory Visit | Attending: Obstetrics & Gynecology | Admitting: Obstetrics & Gynecology

## 2018-10-27 DIAGNOSIS — Z1231 Encounter for screening mammogram for malignant neoplasm of breast: Secondary | ICD-10-CM | POA: Diagnosis not present

## 2018-11-01 ENCOUNTER — Other Ambulatory Visit: Payer: Self-pay | Admitting: Adult Health

## 2018-11-11 ENCOUNTER — Ambulatory Visit: Payer: Self-pay | Admitting: Adult Health

## 2018-11-25 DIAGNOSIS — M9901 Segmental and somatic dysfunction of cervical region: Secondary | ICD-10-CM | POA: Diagnosis not present

## 2018-11-25 DIAGNOSIS — M9903 Segmental and somatic dysfunction of lumbar region: Secondary | ICD-10-CM | POA: Diagnosis not present

## 2018-11-25 DIAGNOSIS — M9902 Segmental and somatic dysfunction of thoracic region: Secondary | ICD-10-CM | POA: Diagnosis not present

## 2018-11-25 DIAGNOSIS — M9904 Segmental and somatic dysfunction of sacral region: Secondary | ICD-10-CM | POA: Diagnosis not present

## 2018-12-01 DIAGNOSIS — M545 Low back pain: Secondary | ICD-10-CM | POA: Diagnosis not present

## 2018-12-22 DIAGNOSIS — M9902 Segmental and somatic dysfunction of thoracic region: Secondary | ICD-10-CM | POA: Diagnosis not present

## 2018-12-22 DIAGNOSIS — M9903 Segmental and somatic dysfunction of lumbar region: Secondary | ICD-10-CM | POA: Diagnosis not present

## 2018-12-22 DIAGNOSIS — M9901 Segmental and somatic dysfunction of cervical region: Secondary | ICD-10-CM | POA: Diagnosis not present

## 2018-12-22 DIAGNOSIS — M9904 Segmental and somatic dysfunction of sacral region: Secondary | ICD-10-CM | POA: Diagnosis not present

## 2018-12-28 DIAGNOSIS — M9903 Segmental and somatic dysfunction of lumbar region: Secondary | ICD-10-CM | POA: Diagnosis not present

## 2018-12-28 DIAGNOSIS — M9904 Segmental and somatic dysfunction of sacral region: Secondary | ICD-10-CM | POA: Diagnosis not present

## 2018-12-28 DIAGNOSIS — M9902 Segmental and somatic dysfunction of thoracic region: Secondary | ICD-10-CM | POA: Diagnosis not present

## 2018-12-28 DIAGNOSIS — M9901 Segmental and somatic dysfunction of cervical region: Secondary | ICD-10-CM | POA: Diagnosis not present

## 2019-01-18 ENCOUNTER — Other Ambulatory Visit: Payer: Self-pay | Admitting: Adult Health

## 2019-02-04 NOTE — Progress Notes (Signed)
Assessment and Plan:  Diagnoses and all orders for this visit:  Tenderness of neck/fullness in neck With hx of Hashimoto's with hypothyroid on synthroid Family history of thyroid cancer Intermittent sx, she has ongoing very high anxiety regarding possible thyroid issue Somewhat vague presentation  Reports intermittently tender though with essentially normal exam exam today  Has had recent normal external labs which were reviewed, normal thyroid panel with + thyroid peroxidase and thyroglobulin antibody consistent with history  Does have family history of thyroid cancer, no recent imaging I discussed it would be reasonable to recheck basic labs, inflammation, and pursue basic imaging ([place]) considering her history and lack of recent imaging. She is reassured by this.  admittedly feeling very high stress and may simply be related, stress management encouraged Alternately if remains unexplained/persistent may consider ENT referral or CT neck.  She will reschedule a follow up to discuss her GI concerns which on brief discussion appear to be unrelated.   -     CBC with Diff -     Sedimentation rate -     C-reactive protein -     TSH -     US THYROID; Future  Further disposition pending results of labs. Discussed med's effects and SE's.   Over 30 minutes of exam, counseling, chart review, and critical decision making was performed.   Future Appointments  Date Time Provider [place]  05/12/2019  3:00 PM Liane Comber, NP [facility] None    ------------------------------------------------------------------------------------------------------------------   HPI  BP 122/76   Pulse 67   Temp 97.9 F (36.6 C)   Ht 5\' 1"  (1.549 m)   Wt 147 lb (66.7 kg)   SpO2 99%   BMI 27.78 kg/m   43 y.o.female with hx of major depression/anxiety, Hashimoto's/hypothyroid presents for evaluation of several concerns - neck/ "thyroid" pain, dysphagia, scalp pain and digestive concerns.   She  reports she is very concerned about her thyroid; in the last 4-6 weeks she has noted intermittent anterior neck pain, over thyroid area, also with tenderness to touch, more on the right, seems to be worse with stress. She reports currently not actively tender or having pain, also has noted approx 1 week of sensation of something in her throat x 1 week (not stuck but feels like something was in there) but spontaneously resolved. Denies drooling, sore throat, hoarseness, pain with swallowing. She denies CP, dyspnea, palpitations, diarrhea/constipation, hair loss, dry skin, heat/cold intolerance.   She is on synthroid for hypothyroid since her 20's (reports confirmed Hashimotos), recently had extensive thyroid labs by Dr. Alroy Dust on 11/24/2018 and presents with printed results: normal CBC, CMP, TSH 2.59, T4 6.9, T3 uptake 35, free thyroxine index 2.4, T3 80, and had positive thyroid peroxidase 240 and thyroglobulin antibody 5.4.   She admits she is feeling a lot of stress and wonders if this may be contributing.   She additionally reports left sided scalp tenderness following a visit with chiropractor/accupuncturist, can be achy, mainly tender, initially was "shooting" and "electric" but has resolved and now mostly tender if presses. She has not taken any medication as she prefers to avoid, taking tumeric daily.   She additionally reports has been working on weight loss and has some GI concerns (feels bad/discomfort after eating) but due to lack of time today will reschedule. I have encouraged her to keep a food/symptom log in the interim.   BMI is Body mass index is 27.78 kg/m., she has been working on diet and exercise. Wt Readings  from Last 3 Encounters:  02/07/19 147 lb (66.7 kg)  10/20/18 160 lb (72.6 kg)  08/04/18 158 lb (71.7 kg)   She is on thyroid medication. Her medication was not changed last visit. Takes 75 mcg daily.    Lab Results  Component Value Date   TSH 2.46 10/20/2018  .     Past Medical History:  Diagnosis Date  . Allergy   . Anxiety   . Arthritis   . Hyperlipidemia 05/12/2018  . Major depressive disorder, 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[place]) 04/15/2016  . 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 Thyroid disease      Allergies  Allergen Reactions  . Hydrocodone Itching  . Oxycodone-Acetaminophen   . Penicillins Swelling    Current Outpatient Medications on File Prior to Visit  Medication Sig  . CALCIUM CITRATE PO Take 1,000 mg by mouth.  . Cholecalciferol (VITAMIN D PO) Take 10,000 Units by mouth daily.   . cyclobenzaprine (FLEXERIL) 5 MG tablet Take 1 tablet (5 mg total) by mouth 3 (three) times daily as needed for muscle spasms.  Marland Kitchen levothyroxine (SYNTHROID) 75 MCG tablet Take 1 tablet daily on an empty stomach with only water for 30 minutes & no Antacid meds, Calcium or Magnesium for 4 hours & avoid Biotin  . magnesium 30 MG tablet Take 30 mg by mouth 2 (two) times daily.  . Multiple Vitamin (MULTIVITAMIN WITH MINERALS) TABS tablet Take 1 tablet by mouth daily.  . Omega-3 Fatty Acids (FISH OIL) 1000 MG CPDR Take by mouth.  . promethazine (PHENERGAN) 25 MG tablet TAKE 1 TABLET BY MOUTH EVERY 6 HOURS AS NEEDED FOR NAUSEA OR HEADACHE  . sucralfate (CARAFATE) 1 g tablet Take 1 tablet (1 g total) by mouth 4 (four) times daily. (Patient taking differently: Take 1 g by mouth 4 (four) times daily. Doesn't take consistently)  . temazepam (RESTORIL) 30 MG capsule TAKE 1 CAPSULE(30 MG) BY MOUTH AT BEDTIME AS NEEDED FOR SLEEP  . Turmeric 450 MG CAPS Take by mouth.  . imiquimod (ALDARA) 5 % cream Apply topically 3 (three) times a week.   No current facility-administered medications on file prior to visit.     ROS: all negative except above.   Physical Exam:  BP 122/76   Pulse 67   Temp 97.9 F (36.6 C)   Ht 5\' 1"  (1.549 m)   Wt 147 lb (66.7 kg)   SpO2 99%   BMI 27.78 kg/m   General Appearance: Well nourished, in no apparent  distress. Eyes: PERRLA, conjunctiva no swelling or erythema Sinuses: No Frontal/maxillary tenderness ENT/Mouth: Ext aud canals clear, TMs without erythema, bulging. No erythema, swelling, or exudate on post pharynx.  Tonsils not swollen or erythematous. Hearing normal.  Neck: Supple, thyroid not notably enlarged or tender, somewhat irregular texture but without distinct nodules Respiratory: Respiratory effort normal, BS equal bilaterally without rales, rhonchi, wheezing or stridor.  Cardio: RRR with no MRGs. Brisk peripheral pulses without edema.  Abdomen: Soft, + BS.  Non tender, no guarding, rebound, hernias, masses. Lymphatics: Non tender without lymphadenopathy.  Musculoskeletal: Mildly tender cervical paraspinals, right occipital scalp, fully ROM without spinous tenderness, symmetrical strength shoulders, upper extremities, normal gait.  Skin: Warm, dry without rashes, lesions, ecchymosis.  Neuro: Cranial nerves intact. Normal muscle tone, no cerebellar symptoms. Sensation intact.  Psych: Awake and oriented X 3, anxious affect, Insight and Judgment fair.     Izora Ribas, NP 3:55 PM [facility]

## 2019-02-07 ENCOUNTER — Ambulatory Visit: Payer: BC Managed Care – PPO | Admitting: Adult Health

## 2019-02-07 ENCOUNTER — Other Ambulatory Visit: Payer: Self-pay

## 2019-02-07 ENCOUNTER — Encounter: Payer: Self-pay | Admitting: Adult Health

## 2019-02-07 VITALS — BP 122/76 | HR 67 | Temp 97.9°F | Ht 61.0 in | Wt 147.0 lb

## 2019-02-07 DIAGNOSIS — M542 Cervicalgia: Secondary | ICD-10-CM

## 2019-02-07 DIAGNOSIS — E063 Autoimmune thyroiditis: Secondary | ICD-10-CM | POA: Diagnosis not present

## 2019-02-07 DIAGNOSIS — E038 Other specified hypothyroidism: Secondary | ICD-10-CM | POA: Diagnosis not present

## 2019-02-07 DIAGNOSIS — Z808 Family history of malignant neoplasm of other organs or systems: Secondary | ICD-10-CM | POA: Diagnosis not present

## 2019-02-07 DIAGNOSIS — R221 Localized swelling, mass and lump, neck: Secondary | ICD-10-CM | POA: Diagnosis not present

## 2019-02-07 NOTE — Patient Instructions (Signed)
Please keep a close log of your stomach symptoms and bring with you to your next appointment - what you are eating, how much, symptoms, etc   HYPERTENSION INFORMATION  Monitor your blood pressure at home, please keep a record and bring that in with you to your next office visit.   Go to the ER if any CP, SOB, nausea, dizziness, severe HA, changes vision/speech  Testing/Procedures: HOW TO TAKE YOUR BLOOD PRESSURE:  Rest 5 minutes before taking your blood pressure.  Don't smoke or drink caffeinated beverages for at least 30 minutes before.  Take your blood pressure before (not after) you eat.  Sit comfortably with your back supported and both feet on the floor (don't cross your legs).  Elevate your arm to heart level on a table or a desk.  Use the proper sized cuff. It should fit smoothly and snugly around your bare upper arm. There should be enough room to slip a fingertip under the cuff. The bottom edge of the cuff should be 1 inch above the crease of the elbow.   Your most recent BP: BP: 122/76   Take your medications faithfully as instructed. Maintain a healthy weight. Get at least 150 minutes of aerobic exercise per week. Minimize salt intake. Minimize alcohol intake  DASH Eating Plan DASH stands for "Dietary Approaches to Stop Hypertension." The DASH eating plan is a healthy eating plan that has been shown to reduce high blood pressure (hypertension). Additional health benefits may include reducing the risk of type 2 diabetes mellitus, heart disease, and stroke. The DASH eating plan may also help with weight loss. WHAT DO I NEED TO KNOW ABOUT THE DASH EATING PLAN? For the DASH eating plan, you will follow these general guidelines:  Choose foods with a percent daily value for sodium of less than 5% (as listed on the food label).  Use salt-free seasonings or herbs instead of table salt or sea salt.  Check with your health care provider or pharmacist before using salt  substitutes.  Eat lower-sodium products, often labeled as "lower sodium" or "no salt added."  Eat fresh foods.  Eat more vegetables, fruits, and low-fat dairy products.  Choose whole grains. Look for the word "whole" as the first word in the ingredient list.  Choose fish and skinless chicken or [place] more often than red meat. Limit fish, poultry, and meat to 6 oz (170 g) each day.  Limit sweets, desserts, sugars, and sugary drinks.  Choose heart-healthy fats.  Limit cheese to 1 oz (28 g) per day.  Eat more home-cooked food and less restaurant, buffet, and fast food.  Limit fried foods.  Cook foods using methods other than frying.  Limit canned vegetables. If you do use them, rinse them well to decrease the sodium.  When eating at a restaurant, ask that your food be prepared with less salt, or no salt if possible. WHAT FOODS CAN I EAT? Seek help from a dietitian for individual calorie needs. Grains Whole grain or whole wheat bread. Brown rice. Whole grain or whole wheat pasta. Quinoa, bulgur, and whole grain cereals. Low-sodium cereals. Corn or whole wheat flour tortillas. Whole grain cornbread. Whole grain crackers. Low-sodium crackers. Vegetables Fresh or frozen vegetables (raw, steamed, roasted, or grilled). Low-sodium or reduced-sodium tomato and vegetable juices. Low-sodium or reduced-sodium tomato sauce and paste. Low-sodium or reduced-sodium canned vegetables.  Fruits All fresh, canned (in natural juice), or frozen fruits. Meat and Other Protein Products Ground beef (85% or leaner), grass-fed beef,  or beef trimmed of fat. Skinless chicken or [place]. Ground chicken or [place]. Pork trimmed of fat. All fish and seafood. Eggs. Dried beans, peas, or lentils. Unsalted nuts and seeds. Unsalted canned beans. Dairy Low-fat dairy products, such as skim or 1% milk, 2% or reduced-fat cheeses, low-fat ricotta or cottage cheese, or plain low-fat yogurt. Low-sodium or reduced-sodium  cheeses. Fats and Oils Tub margarines without trans fats. Light or reduced-fat mayonnaise and salad dressings (reduced sodium). Avocado. Safflower, olive, or canola oils. Natural peanut or almond butter. Other Unsalted popcorn and pretzels. The items listed above may not be a complete list of recommended foods or beverages. Contact your dietitian for more options. WHAT FOODS ARE NOT RECOMMENDED? Grains White bread. White pasta. White rice. Refined cornbread. Bagels and croissants. Crackers that contain trans fat. Vegetables Creamed or fried vegetables. Vegetables in a cheese sauce. Regular canned vegetables. Regular canned tomato sauce and paste. Regular tomato and vegetable juices. Fruits Dried fruits. Canned fruit in light or heavy syrup. Fruit juice. Meat and Other Protein Products Fatty cuts of meat. Ribs, chicken wings, bacon, sausage, bologna, salami, chitterlings, fatback, hot dogs, bratwurst, and packaged luncheon meats. Salted nuts and seeds. Canned beans with salt. Dairy Whole or 2% milk, cream, half-and-half, and cream cheese. Whole-fat or sweetened yogurt. Full-fat cheeses or blue cheese. Nondairy creamers and whipped toppings. Processed cheese, cheese spreads, or cheese curds. Condiments Onion and garlic salt, seasoned salt, table salt, and sea salt. Canned and packaged gravies. Worcestershire sauce. Tartar sauce. Barbecue sauce. Teriyaki sauce. Soy sauce, including reduced sodium. Steak sauce. Fish sauce. Oyster sauce. Cocktail sauce. Horseradish. Ketchup and mustard. Meat flavorings and tenderizers. Bouillon cubes. Hot sauce. Tabasco sauce. Marinades. Taco seasonings. Relishes. Fats and Oils Butter, stick margarine, lard, shortening, ghee, and bacon fat. Coconut, palm kernel, or palm oils. Regular salad dressings. Other Pickles and olives. Salted popcorn and pretzels. The items listed above may not be a complete list of foods and beverages to avoid. Contact your dietitian for  more information. WHERE CAN I FIND MORE INFORMATION? National Heart, Lung, and Blood Institute: travelstabloid.com Document Released: 02/27/2011 Document Revised: 07/25/2013 Document Reviewed: 01/12/2013 [facility] Patient Information 2015 [place], [place]. This information is not intended to replace advice given to you by your health care provider. Make sure you discuss any questions you have with your health care provider.

## 2019-02-08 LAB — CBC WITH DIFFERENTIAL/PLATELET
Absolute Monocytes: 540 cells/uL (ref 200–950)
Basophils Absolute: 38 cells/uL (ref 0–200)
Basophils Relative: 0.5 %
Eosinophils Absolute: 203 cells/uL (ref 15–500)
Eosinophils Relative: 2.7 %
HCT: 38.8 % (ref 35.0–45.0)
Hemoglobin: 13.2 g/dL (ref 11.7–15.5)
Lymphs Abs: 2385 cells/uL (ref 850–3900)
MCH: 32.4 pg (ref 27.0–33.0)
MCHC: 34 g/dL (ref 32.0–36.0)
MCV: 95.3 fL (ref 80.0–100.0)
MPV: 9.8 fL (ref 7.5–12.5)
Monocytes Relative: 7.2 %
Neutro Abs: 4335 cells/uL (ref 1500–7800)
Neutrophils Relative %: 57.8 %
Platelets: 339 10*3/uL (ref 140–400)
RBC: 4.07 10*6/uL (ref 3.80–5.10)
RDW: 12.7 % (ref 11.0–15.0)
Total Lymphocyte: 31.8 %
WBC: 7.5 10*3/uL (ref 3.8–10.8)

## 2019-02-08 LAB — SEDIMENTATION RATE: Sed Rate: 6 mm/h (ref 0–20)

## 2019-02-08 LAB — C-REACTIVE PROTEIN: CRP: 0.6 mg/L (ref <8.0)

## 2019-02-08 LAB — TSH: TSH: 0.41 mIU/L

## 2019-02-10 NOTE — Progress Notes (Deleted)
Assessment and Plan:  There are no diagnoses linked to this encounter.    Further disposition pending results of labs. Discussed med's effects and SE's.   Over 30 minutes of exam, counseling, chart review, and critical decision making was performed.   Future Appointments  Date Time Provider [place]  02/14/2019 11:30 AM Liane Comber, NP [facility] None  02/14/2019  4:00 PM [facility] [place] [facility]  03/09/2019  2:30 PM Liane Comber, NP [facility] None  05/12/2019  3:00 PM Liane Comber, NP [facility] None    ------------------------------------------------------------------------------------------------------------------   HPI 43 y.o.female presents for  Past Medical History:  Diagnosis Date  . Allergy   . Anxiety   . Arthritis   . Hyperlipidemia 05/12/2018  . Major depressive disorder, 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[place]) 04/15/2016  . 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 Thyroid disease      Allergies  Allergen Reactions  . Hydrocodone Itching  . Oxycodone-Acetaminophen   . Penicillins Swelling    Current Outpatient Medications on File Prior to Visit  Medication Sig  . CALCIUM CITRATE PO Take 1,000 mg by mouth.  . Cholecalciferol (VITAMIN D PO) Take 10,000 Units by mouth daily.   . cyclobenzaprine (FLEXERIL) 5 MG tablet Take 1 tablet (5 mg total) by mouth 3 (three) times daily as needed for muscle spasms.  Marland Kitchen imiquimod (ALDARA) 5 % cream Apply topically 3 (three) times a week.  . levothyroxine (SYNTHROID) 75 MCG tablet Take 1 tablet daily on an empty stomach with only water for 30 minutes & no Antacid meds, Calcium or Magnesium for 4 hours & avoid Biotin  . magnesium 30 MG tablet Take 30 mg by mouth 2 (two) times daily.  . Multiple Vitamin (MULTIVITAMIN WITH MINERALS) TABS tablet Take 1 tablet by mouth daily.  . Omega-3 Fatty Acids (FISH OIL) 1000 MG CPDR Take by mouth.  . promethazine (PHENERGAN) 25 MG tablet TAKE 1 TABLET BY  MOUTH EVERY 6 HOURS AS NEEDED FOR NAUSEA OR HEADACHE  . sucralfate (CARAFATE) 1 g tablet Take 1 tablet (1 g total) by mouth 4 (four) times daily. (Patient taking differently: Take 1 g by mouth 4 (four) times daily. Doesn't take consistently)  . temazepam (RESTORIL) 30 MG capsule TAKE 1 CAPSULE(30 MG) BY MOUTH AT BEDTIME AS NEEDED FOR SLEEP  . Turmeric 450 MG CAPS Take by mouth.   No current facility-administered medications on file prior to visit.     ROS: all negative except above.   Physical Exam:  There were no vitals taken for this visit.  General Appearance: Well nourished, in no apparent distress. Eyes: PERRLA, EOMs, conjunctiva no swelling or erythema Sinuses: No Frontal/maxillary tenderness ENT/Mouth: Ext aud canals clear, TMs without erythema, bulging. No erythema, swelling, or exudate on post pharynx.  Tonsils not swollen or erythematous. Hearing normal.  Neck: Supple, thyroid normal.  Respiratory: Respiratory effort normal, BS equal bilaterally without rales, rhonchi, wheezing or stridor.  Cardio: RRR with no MRGs. Brisk peripheral pulses without edema.  Abdomen: Soft, + BS.  Non tender, no guarding, rebound, hernias, masses. Lymphatics: Non tender without lymphadenopathy.  Musculoskeletal: Full ROM, 5/5 strength, normal gait.  Skin: Warm, dry without rashes, lesions, ecchymosis.  Neuro: Cranial nerves intact. Normal muscle tone, no cerebellar symptoms. Sensation intact.  Psych: Awake and oriented X 3, normal affect, Insight and Judgment appropriate.     Izora Ribas, NP 6:11 PM [facility]

## 2019-02-14 ENCOUNTER — Other Ambulatory Visit: Payer: BC Managed Care – PPO

## 2019-02-14 ENCOUNTER — Ambulatory Visit: Payer: BC Managed Care – PPO | Admitting: Adult Health

## 2019-02-14 DIAGNOSIS — Z20828 Contact with and (suspected) exposure to other viral communicable diseases: Secondary | ICD-10-CM | POA: Diagnosis not present

## 2019-02-23 DIAGNOSIS — H35363 Drusen (degenerative) of macula, bilateral: Secondary | ICD-10-CM | POA: Diagnosis not present

## 2019-02-23 DIAGNOSIS — Q120 Congenital cataract: Secondary | ICD-10-CM | POA: Diagnosis not present

## 2019-02-23 DIAGNOSIS — H43393 Other vitreous opacities, bilateral: Secondary | ICD-10-CM | POA: Diagnosis not present

## 2019-02-23 DIAGNOSIS — D3131 Benign neoplasm of right choroid: Secondary | ICD-10-CM | POA: Diagnosis not present

## 2019-02-28 ENCOUNTER — Ambulatory Visit
Admission: RE | Admit: 2019-02-28 | Discharge: 2019-02-28 | Disposition: A | Discharge status: Home / Self Care | Payer: BC Managed Care – PPO | Source: Ambulatory Visit | Attending: Adult Health | Admitting: Adult Health

## 2019-02-28 DIAGNOSIS — E039 Hypothyroidism, unspecified: Secondary | ICD-10-CM | POA: Diagnosis not present

## 2019-02-28 DIAGNOSIS — Z808 Family history of malignant neoplasm of other organs or systems: Secondary | ICD-10-CM

## 2019-02-28 DIAGNOSIS — M542 Cervicalgia: Secondary | ICD-10-CM

## 2019-02-28 DIAGNOSIS — E038 Other specified hypothyroidism: Secondary | ICD-10-CM

## 2019-02-28 DIAGNOSIS — R221 Localized swelling, mass and lump, neck: Secondary | ICD-10-CM

## 2019-03-09 ENCOUNTER — Ambulatory Visit: Payer: BC Managed Care – PPO | Admitting: Adult Health

## 2019-04-04 DIAGNOSIS — M9901 Segmental and somatic dysfunction of cervical region: Secondary | ICD-10-CM | POA: Diagnosis not present

## 2019-04-04 DIAGNOSIS — M9903 Segmental and somatic dysfunction of lumbar region: Secondary | ICD-10-CM | POA: Diagnosis not present

## 2019-04-04 DIAGNOSIS — M9902 Segmental and somatic dysfunction of thoracic region: Secondary | ICD-10-CM | POA: Diagnosis not present

## 2019-04-04 DIAGNOSIS — M9904 Segmental and somatic dysfunction of sacral region: Secondary | ICD-10-CM | POA: Diagnosis not present

## 2019-04-22 DIAGNOSIS — M9904 Segmental and somatic dysfunction of sacral region: Secondary | ICD-10-CM | POA: Diagnosis not present

## 2019-04-22 DIAGNOSIS — M9903 Segmental and somatic dysfunction of lumbar region: Secondary | ICD-10-CM | POA: Diagnosis not present

## 2019-04-22 DIAGNOSIS — M9902 Segmental and somatic dysfunction of thoracic region: Secondary | ICD-10-CM | POA: Diagnosis not present

## 2019-04-22 DIAGNOSIS — M9901 Segmental and somatic dysfunction of cervical region: Secondary | ICD-10-CM | POA: Diagnosis not present

## 2019-04-28 DIAGNOSIS — M9904 Segmental and somatic dysfunction of sacral region: Secondary | ICD-10-CM | POA: Diagnosis not present

## 2019-04-28 DIAGNOSIS — M9901 Segmental and somatic dysfunction of cervical region: Secondary | ICD-10-CM | POA: Diagnosis not present

## 2019-04-28 DIAGNOSIS — M9903 Segmental and somatic dysfunction of lumbar region: Secondary | ICD-10-CM | POA: Diagnosis not present

## 2019-04-28 DIAGNOSIS — M9902 Segmental and somatic dysfunction of thoracic region: Secondary | ICD-10-CM | POA: Diagnosis not present

## 2019-05-03 DIAGNOSIS — M545 Low back pain: Secondary | ICD-10-CM | POA: Diagnosis not present

## 2019-05-11 NOTE — Progress Notes (Deleted)
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 Stephanie Acre; reports resolved and doing well off of medications, working on lifestyle/meditation and reports stable over past year  Hypothyroidism, unspecified type continue medications the same pending lab results reminded to take on an empty stomach 30-59mins before food.  -     TSH  Vitamin D deficiency Continue with supplementation for goal of 70-100 -     VITAMIN D 25 Hydroxy (Vit-D Deficiency, Fractures)  Overweight  Long discussion about weight loss, diet, and exercise Recommended diet heavy in fruits and veggies and low in animal meats, cheeses, and dairy products, appropriate calorie intake Discussed appropriate weight for height and goal (130 lb) Follow up at next visit  Hyperlipdemia Mild to moderate elevations; will recommend medication if LDL persists above 130 Continue low cholesterol diet and exercise.  -     Lipid panel  Screening for diabetes mellitus -     Hemoglobin A1c  Medication management -     CBC with Differential/Platelet -     CMP/GFR -     Urinalysis w microscopic + reflex cultur -     Magnesium   Headaches *** she reports well managed by excedrine and promethazine Declines further evaluation or management She will follow up if not resolving   Discussed med's effects and SE's. Screening labs and tests as requested with regular follow-up as recommended. Over 40 minutes of exam, counseling, chart review, and complex, high level critical decision making was performed this visit.   Future Appointments  Date Time Provider [place]  05/12/2019  3:00 PM Liane Comber, NP [facility] None  05/14/2020  3:00 PM Liane Comber, NP [facility] None    HPI  44 y.o. female   presents for a complete physical and follow up for has Hypothyroid; Vitamin D deficiency; Overweight (BMI 25.0-29.9); Early dry stage nonexudative age-related macular degeneration of both eyes; Insomnia; and Hyperlipidemia on their problem list.   She is married, 1 son, in [place].  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   She reports frequent headaches; reported hx of remote migraines prior to 2010; She reports headache character and location is variable, has nausea and some blurry vision but not debilitating. She takes excedrin and promethazine which seems to resolve headaches well. She has been on daily agent previously. Declines any interventions today.  She was previously followed by Dr. Stephanie Acre for schizophrenia, and was seeing [name] (counsellor) regularly as well in 2018, but reports she has tapered off of medications and is doing well. She does not believe the diagnoses was accurate. She has been doing lots of meditation in the evening and feels this has helped more than anything.  Followed annually by GYN Dr. Dolly Rias, for mammograms and PAP smears. She plans to switch providers to [facility] Dr. Estelle June. 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   BMI is There is no height or weight on file to calculate BMI., she is working on diet and exercise. She walks most days, does yoga.  She does watch her diet, has lost weight previously, plans to get down to 130 lb over  the next year or so.  Drinks mainly water, aims 75-100 fluid ounces of water daily. 1 cup of coffee in AM.  Wt Readings from Last 3 Encounters:  02/07/19 147 lb (66.7 kg)  10/20/18 160 lb (72.6 kg)  08/04/18 158 lb (71.7 kg)   Her blood pressure has been controlled at home (110/70), today their BP is   She does workout. She denies  chest pain, shortness of breath, dizziness.   She is not on cholesterol medication. Her cholesterol is not at goal. The cholesterol last visit was:   Lab Results  Component Value Date   CHOL 222 (H) 10/20/2018   HDL 64 10/20/2018   LDLCALC 132 (H) 10/20/2018   TRIG 146 10/20/2018   CHOLHDL 3.5 10/20/2018    Last A1C in the office was:  Lab Results  Component Value Date   HGBA1C 5.2 05/11/2018   She is on thyroid medication. Her medication was not changed last visit.   Lab Results  Component Value Date   TSH 0.41 02/07/2019   Last GFR: Lab Results  Component Value Date   GFRNONAA 97 10/20/2018   Patient is on Vitamin D supplement and at goal of 70:    Lab Results  Component Value Date   VD25OH 76 05/11/2018      Lab Results  Component Value Date   [id number] (H) 05/07/2017   Lab Results  Component Value Date   IRON 97 10/12/2015   TIBC 328 10/12/2015     Current Medications:  Current Outpatient Medications on File Prior to Visit  Medication Sig Dispense Refill  . CALCIUM CITRATE PO Take 1,000 mg by mouth.    . Cholecalciferol (VITAMIN D PO) Take 10,000 Units by mouth daily.     . cyclobenzaprine (FLEXERIL) 5 MG tablet Take 1 tablet (5 mg total) by mouth 3 (three) times daily as needed for muscle spasms. 60 tablet 0  . imiquimod (ALDARA) 5 % cream Apply topically 3 (three) times a week.    . levothyroxine (SYNTHROID) 75 MCG tablet Take 1 tablet daily on an empty stomach with only water for 30 minutes & no Antacid meds, Calcium or Magnesium for 4 hours & avoid Biotin 90 tablet 1  . magnesium 30 MG tablet Take 30 mg by mouth 2 (two) times daily.    . Multiple Vitamin (MULTIVITAMIN WITH MINERALS) TABS tablet Take 1 tablet by mouth daily.    . Omega-3 Fatty Acids (FISH OIL) 1000 MG CPDR Take by mouth.    . promethazine (PHENERGAN) 25 MG tablet TAKE 1 TABLET BY MOUTH EVERY 6 HOURS AS NEEDED FOR NAUSEA OR HEADACHE 30 tablet 0  . sucralfate (CARAFATE) 1 g tablet  Take 1 tablet (1 g total) by mouth 4 (four) times daily. (Patient taking differently: Take 1 g by mouth 4 (four) times daily. Doesn't take consistently) 120 tablet 1  . temazepam (RESTORIL) 30 MG capsule TAKE 1 CAPSULE(30 MG) BY MOUTH AT BEDTIME AS NEEDED FOR SLEEP 30 capsule 0  . Turmeric 450 MG CAPS Take by mouth.     No current facility-administered medications on file prior to visit.   Allergies:  Allergies  Allergen Reactions  . Hydrocodone Itching  . Oxycodone-Acetaminophen   . Penicillins Swelling   Medical History:  She has Hypothyroid; Vitamin D deficiency; Overweight (BMI 25.0-29.9); Early dry stage nonexudative age-related macular degeneration of both eyes; Insomnia; and Hyperlipidemia on their problem list. Health Maintenance:   Immunization History  Administered Date(s) Administered  .  Influenza,inj,quad, With Preservative 01/17/2016   Tetanus: - declines today Flu vaccine: 2017, declines  LMP: No LMP recorded. Patient is perimenopausal. Pap:    By GYN - 07/2015, was recommended 5 year follow up  MGM:  By GYN - need reports- requested - will schedule ***  Head CT: 04/18/2016  Last Dental Exam: 2019- goes q6 Last Eye Exam: Annually, last ***, has macular degeneration, needs to schedule   Patient Care Team: Unk Pinto, MD as PCP - General (Internal Medicine)  Surgical History:  She has a past surgical history that includes Tonsillectomy and adenoidectomy (1981); Knee arthroscopy (Left, 1988); Knee arthroscopy (Right, 1999); Ankle arthroscopy (Right, 2000); laparoscopy (2001); and Nasal polyp surgery (2006). Family History:  Herfamily history includes Alcohol abuse in her brother, father, and mother; Clotting disorder in her mother; Drug abuse in her brother, father, and mother; Heart disease in her brother and paternal grandfather; Kidney disease in her maternal grandfather; Stroke in her paternal grandfather; Thyroid cancer in her maternal grandmother. Social  History:  She reports that she has never smoked. She has never used smokeless tobacco. She reports current alcohol use. She reports that she does not use drugs.  Review of Systems: Review of Systems  Constitutional: Negative for malaise/fatigue and weight loss.  HENT: Negative for hearing loss and tinnitus.   Eyes: Negative for blurred vision and double vision.  Respiratory: Negative for cough, shortness of breath and wheezing.   Cardiovascular: Negative for chest pain, palpitations, orthopnea, claudication and leg swelling.  Gastrointestinal: Negative for abdominal pain, blood in stool, constipation, diarrhea, heartburn, melena, nausea and vomiting.  Genitourinary: Negative.   Musculoskeletal: Negative for joint pain and myalgias.  Skin: Negative for rash.  Neurological: Positive for headaches. Negative for dizziness, tingling, sensory change and weakness.  Endo/Heme/Allergies: Negative for environmental allergies and polydipsia.  Psychiatric/Behavioral: Negative for depression, hallucinations, memory loss, substance abuse and suicidal ideas. The patient is not nervous/anxious and does not have insomnia.   All other systems reviewed and are negative.   Physical Exam: Estimated body mass index is 27.78 kg/m as calculated from the following:   Height as of 02/07/19: 5\' 1"  (1.549 m).   Weight as of 02/07/19: 147 lb (66.7 kg). There were no vitals taken for this visit. General Appearance: Well nourished, in no apparent distress.  Eyes: PERRLA, EOMs, conjunctiva no swelling or erythema, normal fundi and vessels.  Sinuses: No Frontal/maxillary tenderness  ENT/Mouth: Ext aud canals clear, normal light reflex with TMs without erythema, bulging. Good dentition. No erythema, swelling, or exudate on post pharynx. Tonsils not swollen or erythematous. Hearing normal.  Neck: Supple, thyroid normal. No bruits  Respiratory: Respiratory effort normal, BS equal bilaterally without rales, rhonchi,  wheezing or stridor.  Cardio: RRR without murmurs, rubs or gallops. Brisk peripheral pulses without edema.  Chest: symmetric, with normal excursions and percussion.  Breasts: Declines, defer to GYN Abdomen: Soft, nontender, no guarding, rebound, hernias, masses, or organomegaly.  Lymphatics: Non tender without lymphadenopathy.  Genitourinary: Defer to GYN Musculoskeletal: Full ROM all peripheral extremities,5/5 strength, and normal gait.  Skin: Warm, dry without rashes, lesions, ecchymosis. Neuro: Cranial nerves intact, reflexes equal bilaterally. Normal muscle tone, no cerebellar symptoms. Sensation intact.  Psych: Awake and oriented X 3, normal affect, Insight and Judgment fair.   EKG: WNL baseline in chart, defer  Gorden Harms Dawn Convery 1:48 PM [facility]

## 2019-05-12 ENCOUNTER — Encounter: Payer: Self-pay | Admitting: Adult Health

## 2019-05-17 DIAGNOSIS — F325 Major depressive disorder, single episode, in full remission: Secondary | ICD-10-CM | POA: Insufficient documentation

## 2019-05-17 NOTE — Progress Notes (Signed)
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 Stephanie Acre; reports resolved and doing well off of medications, working on lifestyle/meditation and reports stable over past year  Hypothyroidism, unspecified type continue medications the same pending lab results reminded to take on an empty stomach 30-21mins before food.  -     TSH  Vitamin D deficiency Continue with supplementation for goal of 70-100 -     VITAMIN D 25 Hydroxy (Vit-D Deficiency, Fractures)  Overweight  Long discussion about weight loss, diet, and exercise Recommended diet heavy in fruits and veggies and low in animal meats, cheeses, and dairy products, appropriate calorie intake Discussed appropriate weight for height and goal (130 lb) Follow up at next visit  Hyperlipdemia Mild to moderate elevations; will recommend medication if LDL persists above 130 Continue low cholesterol diet and exercise.  -     Lipid panel  Screening for diabetes mellitus -     Hemoglobin A1c  Medication management -     CBC with Differential/Platelet -     CMP/GFR -     Urinalysis w microscopic + reflex cultur -     Magnesium   Headaches she reports well managed by dietary modification Declines further evaluation or management at this time  Orders Placed This Encounter  Procedures  . CBC with Differential/Platelet  . COMPLETE METABOLIC PANEL WITH GFR  . Magnesium  . Lipid panel  . TSH  . Hemoglobin A1c  . VITAMIN D 25 Hydroxy (Vit-D Deficiency, Fractures)  . Urinalysis, Routine w reflex microscopic  . Vitamin B12  . Iron, TIBC and Ferritin Panel     Discussed med's effects and SE's. Screening labs and tests as requested with regular follow-up as recommended. Over 40 minutes of exam, counseling, chart  review, and complex, high level critical decision making was performed this visit.   Future Appointments  Date Time Provider [place]  05/17/2020  2:00 PM Liane Comber, NP [facility] None    HPI  44 y.o. female  presents for a complete physical and follow up for has Schizophreniform disorder ([place]); Hypothyroid; Vitamin D deficiency; Overweight (BMI 25.0-29.9); Early dry stage nonexudative age-related macular degeneration of both eyes; Insomnia; Hyperlipidemia; and Major depression in remission [facility]) on their problem list.   She is married, 1 son, in [place].  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   She reports frequent headaches; reported hx of remote migraines prior to 2010; She reports headache character and location is variable, has nausea and some blurry vision but not debilitating. She takes excedrin and promethazine which seems to resolve headaches well. She has been on daily agent previously. Declines any interventions today.  She was previously followed by Dr. Stephanie Acre for schizophrenia, and was seeing [name] (counsellor) regularly as well in 2018, but reports she has tapered off of medications and is doing well. She does not believe the diagnoses was accurate. She has been doing lots of meditation in the evening and feels this has helped more than anything.  Followed annually by GYN Dr. Estelle June at [facility] [place], for mammograms and PAP smears.  She follows with chiropractor and PT for any orthopedic concerns.   BMI is Body mass index  is 27.55 kg/m., she is working on diet and exercise.  She walks most days, does yoga.  She does watch her diet, has lost weight previously, plans to get down to 130 lb over the next year or so.  Drinks mainly water, aims 75-100 fluid ounces of water daily.  Has stopped coffee, avoiding caffeine  Wt Readings  from Last 3 Encounters:  05/18/19 145 lb 12.8 oz (66.1 kg)  02/07/19 147 lb (66.7 kg)  10/20/18 160 lb (72.6 kg)   Her blood pressure has been controlled at home (110/70), today their BP is BP: 110/72 She does workout. She denies chest pain, shortness of breath, dizziness.   She is not on cholesterol medication. Her cholesterol is not at goal. The cholesterol last visit was:   Lab Results  Component Value Date   CHOL 222 (H) 10/20/2018   HDL 64 10/20/2018   LDLCALC 132 (H) 10/20/2018   TRIG 146 10/20/2018   CHOLHDL 3.5 10/20/2018    Last A1C in the office was:  Lab Results  Component Value Date   HGBA1C 5.2 05/11/2018   She is on thyroid medication. Her medication was not changed last visit. Take 75 mcg daily, 1/2 tab on Mon/Thurs.   Lab Results  Component Value Date   TSH 0.41 02/07/2019   Last GFR: Lab Results  Component Value Date   GFRNONAA 97 10/20/2018   Patient is on Vitamin D supplement and at goal of 70:    Lab Results  Component Value Date   VD25OH 76 05/11/2018      Lab Results  Component Value Date   [id number] (H) 05/07/2017   Lab Results  Component Value Date   IRON 97 10/12/2015   TIBC 328 10/12/2015     Current Medications:  Current Outpatient Medications on File Prior to Visit  Medication Sig Dispense Refill  . CALCIUM CITRATE PO Take 1,000 mg by mouth.    . Cholecalciferol (VITAMIN D PO) Take 10,000 Units by mouth daily.     . cyclobenzaprine (FLEXERIL) 5 MG tablet Take 1 tablet (5 mg total) by mouth 3 (three) times daily as needed for muscle spasms. 60 tablet 0  . levothyroxine (SYNTHROID) 75 MCG tablet Take 1 tablet daily on an empty stomach with only water for 30 minutes & no Antacid meds, Calcium or Magnesium for 4 hours & avoid Biotin 90 tablet 1  . magnesium 30 MG tablet Take 30 mg by mouth 2 (two) times daily.    . Multiple Vitamin (MULTIVITAMIN WITH MINERALS) TABS tablet Take 1 tablet by mouth daily.    . Omega-3 Fatty Acids  (FISH OIL) 1000 MG CPDR Take by mouth.    . promethazine (PHENERGAN) 25 MG tablet TAKE 1 TABLET BY MOUTH EVERY 6 HOURS AS NEEDED FOR NAUSEA OR HEADACHE 30 tablet 0  . sucralfate (CARAFATE) 1 g tablet Take 1 tablet (1 g total) by mouth 4 (four) times daily. (Patient taking differently: Take 1 g by mouth 4 (four) times daily. Doesn't take consistently) 120 tablet 1  . temazepam (RESTORIL) 30 MG capsule TAKE 1 CAPSULE(30 MG) BY MOUTH AT BEDTIME AS NEEDED FOR SLEEP 30 capsule 0  . Turmeric 450 MG CAPS Take by mouth.    . imiquimod (ALDARA) 5 % cream Apply topically 3 (three) times a week.     No current facility-administered medications on file prior to visit.   Allergies:  Allergies  Allergen Reactions  . Hydrocodone Itching  . Oxycodone-Acetaminophen   .  Penicillins Swelling   Medical History:  She has Schizophreniform disorder ([place]); Hypothyroid; Vitamin D deficiency; Overweight (BMI 25.0-29.9); Early dry stage nonexudative age-related macular degeneration of both eyes; Insomnia; Hyperlipidemia; and Major depression in remission ([place]) on their problem list. Health Maintenance:   Immunization History  Administered Date(s) Administered  . Influenza,inj,quad, With Preservative 01/17/2016   Tetanus: - declines today Flu vaccine: 2017, declines  LMP: No LMP recorded. Patient is perimenopausal. Pap:    By GYN - annually, has scheduled this Friday MGM:  By GYN - has scheduled this friday  Head CT: 04/18/2016  Last Dental Exam: 2021- goes q6 Last Eye Exam: Annually, last 2021, has ? macular degeneration, monitoring only   Patient Care Team: Unk Pinto, MD as PCP - General (Internal Medicine)  Surgical History:  She has a past surgical history that includes Tonsillectomy and adenoidectomy (1981); Knee arthroscopy (Left, 1988); Knee arthroscopy (Right, 1999); Ankle arthroscopy (Right, 2000); laparoscopy (2001); and Nasal polyp surgery (2006). Family History:  Herfamily history  includes Alcohol abuse in her brother, father, and mother; Clotting disorder in her mother; Drug abuse in her brother, father, and mother; Heart disease in her brother and paternal grandfather; Kidney disease in her maternal grandfather; Stroke in her paternal grandfather; Thyroid cancer in her maternal grandmother. Social History:  She reports that she has never smoked. She has never used smokeless tobacco. She reports current alcohol use. She reports that she does not use drugs.  Review of Systems: Review of Systems  Constitutional: Negative for malaise/fatigue and weight loss.  HENT: Negative for hearing loss and tinnitus.   Eyes: Negative for blurred vision and double vision.  Respiratory: Negative for cough, shortness of breath and wheezing.   Cardiovascular: Negative for chest pain, palpitations, orthopnea, claudication and leg swelling.  Gastrointestinal: Negative for abdominal pain, blood in stool, constipation, diarrhea, heartburn, melena, nausea and vomiting.       Bloating  Genitourinary: Negative.   Musculoskeletal: Negative for joint pain and myalgias.  Skin: Negative for rash.  Neurological: Positive for headaches. Negative for dizziness, tingling, sensory change and weakness.  Endo/Heme/Allergies: Negative for environmental allergies and polydipsia.  Psychiatric/Behavioral: Negative for depression, hallucinations, memory loss, substance abuse and suicidal ideas. The patient is not nervous/anxious and does not have insomnia.   All other systems reviewed and are negative.   Physical Exam: Estimated body mass index is 27.55 kg/m as calculated from the following:   Height as of this encounter: 5\' 1"  (1.549 m).   Weight as of this encounter: 145 lb 12.8 oz (66.1 kg). BP 110/72   Pulse 80   Temp 98.1 F (36.7 C)   Ht 5\' 1"  (1.549 m)   Wt 145 lb 12.8 oz (66.1 kg)   SpO2 98%   BMI 27.55 kg/m  General Appearance: Well nourished, in no apparent distress.  Eyes: PERRLA, EOMs,  conjunctiva no swelling or erythema Sinuses: No Frontal/maxillary tenderness  ENT/Mouth: Ext aud canals clear, normal light reflex with TMs without erythema, bulging. Mask in place; oral exxam deferred. Hearing normal.  Neck: Supple, thyroid normal. No bruits  Respiratory: Respiratory effort normal, BS equal bilaterally without rales, rhonchi, wheezing or stridor.  Cardio: RRR without murmurs, rubs or gallops. Brisk peripheral pulses without edema.  Chest: symmetric, with normal excursions and percussion.  Breasts: Declines, defer to GYN Abdomen: Soft, nontender, no guarding, rebound, hernias, masses, or organomegaly.  Lymphatics: Non tender without lymphadenopathy.  Genitourinary: Defer to GYN Musculoskeletal: Full ROM all peripheral extremities,5/5 strength, and normal  gait.  Skin: Warm, dry without rashes, lesions, ecchymosis. Neuro: Cranial nerves intact, reflexes equal bilaterally. Normal muscle tone, no cerebellar symptoms. Sensation intact.  Psych: Awake and oriented X 3, normal affect, Insight and Judgment fair.   EKG: WNL baseline in chart, defer  Izora Ribas 2:24 PM [facility]

## 2019-05-18 ENCOUNTER — Other Ambulatory Visit: Payer: Self-pay

## 2019-05-18 ENCOUNTER — Ambulatory Visit (INDEPENDENT_AMBULATORY_CARE_PROVIDER_SITE_OTHER): Payer: BC Managed Care – PPO | Admitting: Adult Health

## 2019-05-18 ENCOUNTER — Encounter: Payer: Self-pay | Admitting: Adult Health

## 2019-05-18 VITALS — BP 110/72 | HR 80 | Temp 98.1°F | Ht 61.0 in | Wt 145.8 lb

## 2019-05-18 DIAGNOSIS — F2081 Schizophreniform disorder: Secondary | ICD-10-CM

## 2019-05-18 DIAGNOSIS — Z1329 Encounter for screening for other suspected endocrine disorder: Secondary | ICD-10-CM

## 2019-05-18 DIAGNOSIS — Z131 Encounter for screening for diabetes mellitus: Secondary | ICD-10-CM

## 2019-05-18 DIAGNOSIS — H353131 Nonexudative age-related macular degeneration, bilateral, early dry stage: Secondary | ICD-10-CM

## 2019-05-18 DIAGNOSIS — Z1389 Encounter for screening for other disorder: Secondary | ICD-10-CM | POA: Diagnosis not present

## 2019-05-18 DIAGNOSIS — G4700 Insomnia, unspecified: Secondary | ICD-10-CM

## 2019-05-18 DIAGNOSIS — Z13220 Encounter for screening for lipoid disorders: Secondary | ICD-10-CM

## 2019-05-18 DIAGNOSIS — Z79899 Other long term (current) drug therapy: Secondary | ICD-10-CM | POA: Diagnosis not present

## 2019-05-18 DIAGNOSIS — E038 Other specified hypothyroidism: Secondary | ICD-10-CM

## 2019-05-18 DIAGNOSIS — Z0000 Encounter for general adult medical examination without abnormal findings: Secondary | ICD-10-CM | POA: Diagnosis not present

## 2019-05-18 DIAGNOSIS — E785 Hyperlipidemia, unspecified: Secondary | ICD-10-CM

## 2019-05-18 DIAGNOSIS — E559 Vitamin D deficiency, unspecified: Secondary | ICD-10-CM | POA: Diagnosis not present

## 2019-05-18 DIAGNOSIS — Z0001 Encounter for general adult medical examination with abnormal findings: Secondary | ICD-10-CM

## 2019-05-18 DIAGNOSIS — E663 Overweight: Secondary | ICD-10-CM

## 2019-05-18 DIAGNOSIS — F325 Major depressive disorder, single episode, in full remission: Secondary | ICD-10-CM

## 2019-05-18 DIAGNOSIS — R030 Elevated blood-pressure reading, without diagnosis of hypertension: Secondary | ICD-10-CM

## 2019-05-18 DIAGNOSIS — Z130 Encounter for screening for diseases of the blood and blood-forming organs and certain disorders involving the immune mechanism: Secondary | ICD-10-CM | POA: Diagnosis not present

## 2019-05-18 DIAGNOSIS — D649 Anemia, unspecified: Secondary | ICD-10-CM

## 2019-05-18 NOTE — Patient Instructions (Addendum)
Charlotte Wise , Thank you for taking time to come for your Annual Wellness Visit. I appreciate your ongoing commitment to your health goals. Please review the following plan we discussed and let me know if I can assist you in the future.   These are the goals we discussed: Goals    . Exercise 150 min/wk Moderate Activity       This is a list of the screening recommended for you and due dates:  Health Maintenance  Topic Date Due  . HIV Screening  07/31/1989  . Tetanus Vaccine  07/31/1993  . Flu Shot  10/23/2018  . Pap Smear  10/22/2020      Know what a healthy weight is for you (roughly BMI <25) and aim to maintain this  Aim for 7+ servings of fruits and vegetables daily  65-80+ fluid ounces of water or unsweet tea for healthy kidneys  Limit to max 1 drink of alcohol per day; avoid smoking/tobacco  Limit animal fats in diet for cholesterol and heart health - choose grass fed whenever available  Avoid highly processed foods, and foods high in saturated/trans fats  Aim for low stress - take time to unwind and care for your mental health  Aim for 150 min of moderate intensity exercise weekly for heart health, and weights twice weekly for bone health  Aim for 7-9 hours of sleep daily   Abdominal Bloating When you have abdominal bloating, your abdomen may feel full, tight, or painful. It may also look bigger than normal or swollen (distended). Common causes of abdominal bloating include:  Swallowing air.  Constipation.  Problems digesting food.  Eating too much.  Irritable bowel syndrome. This is a condition that affects the large intestine.  Lactose intolerance. This is an inability to digest lactose, a natural sugar in dairy products.  Celiac disease. This is a condition that affects the ability to digest gluten, a protein found in some grains.  Gastroparesis. This is a condition that slows down the movement of food in the stomach and small intestine. It is more  common in people with diabetes mellitus.  Gastroesophageal reflux disease (GERD). This is a digestive condition that makes stomach acid flow back into the esophagus.  Urinary retention. This means that the body is holding onto urine, and the bladder cannot be emptied all the way. Follow these instructions at home: Eating and drinking  Avoid eating too much.  Try not to swallow air while talking or eating.  Avoid eating while lying down.  Avoid these foods and drinks: ? Foods that cause gas, such as broccoli, cabbage, cauliflower, and baked beans. ? Carbonated drinks. ? Hard candy. ? Chewing gum. Medicines  Take over-the-counter and prescription medicines only as told by your health care provider.  Take probiotic medicines. These medicines contain live bacteria or yeasts that can help digestion.  Take coated peppermint oil capsules. Activity  Try to exercise regularly. Exercise may help to relieve bloating that is caused by gas and relieve constipation. General instructions  Keep all follow-up visits as told by your health care provider. This is important. Contact a health care provider if:  You have nausea and vomiting.  You have diarrhea.  You have abdominal pain.  You have unusual weight loss or weight gain.  You have severe pain, and medicines do not help. Get help right away if:  You have severe chest pain.  You have trouble breathing.  You have shortness of breath.  You have trouble urinating.  You have darker urine than normal.  You have blood in your stools or have dark, tarry stools. Summary  Abdominal bloating means that the abdomen is swollen.  Common causes of abdominal bloating are swallowing air, constipation, and problems digesting food.  Avoid eating too much and avoid swallowing air.  Avoid foods that cause gas, carbonated drinks, hard candy, and chewing gum. This information is not intended to replace advice given to you by your health  care provider. Make sure you discuss any questions you have with your health care provider. Document Revised: 06/28/2018 Document Reviewed: 04/11/2016 Elsevier Patient Education  [name].

## 2019-05-19 LAB — CBC WITH DIFFERENTIAL/PLATELET
Absolute Monocytes: 605 cells/uL (ref 200–950)
Basophils Absolute: 38 cells/uL (ref 0–200)
Basophils Relative: 0.6 %
Eosinophils Absolute: 120 cells/uL (ref 15–500)
Eosinophils Relative: 1.9 %
HCT: 37.5 % (ref 35.0–45.0)
Hemoglobin: 12.5 g/dL (ref 11.7–15.5)
Lymphs Abs: 1726 cells/uL (ref 850–3900)
MCH: 32 pg (ref 27.0–33.0)
MCHC: 33.3 g/dL (ref 32.0–36.0)
MCV: 95.9 fL (ref 80.0–100.0)
MPV: 10 fL (ref 7.5–12.5)
Monocytes Relative: 9.6 %
Neutro Abs: 3812 cells/uL (ref 1500–7800)
Neutrophils Relative %: 60.5 %
Platelets: 310 10*3/uL (ref 140–400)
RBC: 3.91 10*6/uL (ref 3.80–5.10)
RDW: 12.2 % (ref 11.0–15.0)
Total Lymphocyte: 27.4 %
WBC: 6.3 10*3/uL (ref 3.8–10.8)

## 2019-05-19 LAB — COMPLETE METABOLIC PANEL WITH GFR
AG Ratio: 2 (calc) (ref 1.0–2.5)
ALT: 9 U/L (ref 6–29)
AST: 15 U/L (ref 10–30)
Albumin: 4.4 g/dL (ref 3.6–5.1)
Alkaline phosphatase (APISO): 45 U/L (ref 31–125)
BUN: 10 mg/dL (ref 7–25)
CO2: 28 mmol/L (ref 20–32)
Calcium: 9.4 mg/dL (ref 8.6–10.2)
Chloride: 104 mmol/L (ref 98–110)
Creat: 0.82 mg/dL (ref 0.50–1.10)
GFR, Est African American: 101 mL/min/{1.73_m2} (ref >=60)
GFR, Est Non African American: 87 mL/min/{1.73_m2} (ref >=60)
Globulin: 2.2 g/dL (calc) (ref 1.9–3.7)
Glucose, Bld: 103 mg/dL — ABNORMAL HIGH (ref 65–99)
Potassium: 4.7 mmol/L (ref 3.5–5.3)
Sodium: 138 mmol/L (ref 135–146)
Total Bilirubin: 0.9 mg/dL (ref 0.2–1.2)
Total Protein: 6.6 g/dL (ref 6.1–8.1)

## 2019-05-19 LAB — URINALYSIS, ROUTINE W REFLEX MICROSCOPIC
Bilirubin Urine: NEGATIVE
Glucose, UA: NEGATIVE
Hgb urine dipstick: NEGATIVE
Ketones, ur: NEGATIVE
Leukocytes,Ua: NEGATIVE
Nitrite: NEGATIVE
Protein, ur: NEGATIVE
Specific Gravity, Urine: 1.006 (ref 1.001–1.03)
pH: 6.5 (ref 5.0–8.0)

## 2019-05-19 LAB — VITAMIN D 25 HYDROXY (VIT D DEFICIENCY, FRACTURES): Vit D, 25-Hydroxy: 64 ng/mL (ref 30–100)

## 2019-05-19 LAB — LIPID PANEL
Cholesterol: 165 mg/dL (ref <200)
HDL: 58 mg/dL (ref >=50)
LDL Cholesterol (Calc): 93 mg/dL (calc)
Non-HDL Cholesterol (Calc): 107 mg/dL (calc) (ref <130)
Total CHOL/HDL Ratio: 2.8 (calc) (ref <5.0)
Triglycerides: 53 mg/dL (ref <150)

## 2019-05-19 LAB — TSH: TSH: 1.06 mIU/L

## 2019-05-19 LAB — IRON,TIBC AND FERRITIN PANEL
%SAT: 26 % (calc) (ref 16–45)
Ferritin: 34 ng/mL (ref 16–232)
Iron: 81 ug/dL (ref 40–190)
TIBC: 315 mcg/dL (calc) (ref 250–450)

## 2019-05-19 LAB — HEMOGLOBIN A1C
Hgb A1c MFr Bld: 5.1 % of total Hgb (ref <5.7)
Mean Plasma Glucose: 100 (calc)
eAG (mmol/L): 5.5 (calc)

## 2019-05-19 LAB — VITAMIN B12: Vitamin B-12: 963 pg/mL (ref 200–1100)

## 2019-05-19 LAB — MAGNESIUM: Magnesium: 2.1 mg/dL (ref 1.5–2.5)

## 2019-05-20 DIAGNOSIS — M545 Low back pain: Secondary | ICD-10-CM | POA: Diagnosis not present

## 2019-05-20 DIAGNOSIS — Z01419 Encounter for gynecological examination (general) (routine) without abnormal findings: Secondary | ICD-10-CM | POA: Diagnosis not present

## 2019-05-27 DIAGNOSIS — Z3202 Encounter for pregnancy test, result negative: Secondary | ICD-10-CM | POA: Diagnosis not present

## 2019-05-27 DIAGNOSIS — Z30430 Encounter for insertion of intrauterine contraceptive device: Secondary | ICD-10-CM | POA: Diagnosis not present

## 2019-06-08 ENCOUNTER — Other Ambulatory Visit: Payer: Self-pay | Admitting: Adult Health

## 2019-06-08 DIAGNOSIS — E039 Hypothyroidism, unspecified: Secondary | ICD-10-CM

## 2019-06-13 ENCOUNTER — Other Ambulatory Visit: Payer: Self-pay | Admitting: Adult Health

## 2019-06-13 ENCOUNTER — Telehealth: Payer: Self-pay

## 2019-06-13 NOTE — Telephone Encounter (Signed)
Patient states that she is having pain and numbness in right leg, right arm, lower part of face and neck. Patient states that she was laying mulch down over a week ago and had to rest a few days after and since then these symptoms have appeared.

## 2019-06-13 NOTE — Telephone Encounter (Signed)
Patient notified and advised to go to the ER if she is having any worsening symptoms. Patient is scheduled to be seen tomorrow in our office.

## 2019-06-14 ENCOUNTER — Other Ambulatory Visit: Payer: Self-pay

## 2019-06-14 ENCOUNTER — Encounter: Payer: Self-pay | Admitting: Adult Health Nurse Practitioner

## 2019-06-14 ENCOUNTER — Ambulatory Visit: Payer: BC Managed Care – PPO | Admitting: Adult Health Nurse Practitioner

## 2019-06-14 VITALS — BP 124/74 | HR 91 | Temp 97.3°F | Wt 145.4 lb

## 2019-06-14 DIAGNOSIS — M791 Myalgia, unspecified site: Secondary | ICD-10-CM | POA: Diagnosis not present

## 2019-06-14 DIAGNOSIS — R030 Elevated blood-pressure reading, without diagnosis of hypertension: Secondary | ICD-10-CM

## 2019-06-14 DIAGNOSIS — R202 Paresthesia of skin: Secondary | ICD-10-CM | POA: Diagnosis not present

## 2019-06-14 DIAGNOSIS — E785 Hyperlipidemia, unspecified: Secondary | ICD-10-CM

## 2019-06-14 MED ORDER — PREDNISONE 10 MG (21) PO TBPK: ORAL_TABLET | Freq: Every day | ORAL | 0 refills | Status: DC

## 2019-06-14 NOTE — Patient Instructions (Signed)
   We are going to send in prednisone taper for you to take.  Take this medication with food.  Keep record of your symptoms recording location on your body and how long they last.  This will help if your symptoms are not improving on this treatment.

## 2019-06-14 NOTE — Progress Notes (Signed)
Assessment and Plan:  Charlotte Wise was seen today for acute visit and numbness.  Diagnoses and all orders for this visit:  Paresthesias -     Cancel: ANA -     Sedimentation rate -     Rheumatoid factor -     C-reactive protein -     predniSONE (STERAPRED UNI-PAK 21 TAB) 10 MG (21) TBPK tablet; Take by mouth daily. Follow directions on Taper pack -     ANA  Elevated BP without diagnosis of hypertension Doing well controlled today  Hyperlipidemia, unspecified hyperlipidemia type Fish Oil Continue low cholesterol diet and exercise.  Check lipid panel.   Myalgias Tick bite last year -     B. burgdorfi antibodies by WB   (Negative)   Inconsistent assessment for any neuro abnormalities.  Looking back there have been multiple episodes of myalgia and paresthesia symptoms.  No noted pattern or location.  Discussed sending for nerve conduction testing, possible EMG for myalgia symptoms?  Would like to request neurology evaluation for specialist review.   Further disposition pending results of labs. Discussed med's effects and SE's.   Over 30 minutes of face to face interview, exam, counseling, chart review, and critical decision making was performed.   Future Appointments  Date Time Provider [place]  06/14/2019  3:30 PM Garnet Sierras, NP [facility] None  11/16/2019  8:45 AM Liane Comber, NP [facility] None  05/17/2020  2:00 PM Liane Comber, NP [facility] None    ------------------------------------------------------------------------------------------------------------------   HPI 44 y.o.female presents for evaluation of numbness that started on  two weeks ago she was doing yard work and carrying buckets of mulch in her yard.  She reports they had been doing yard work the entire weekend.  She also had severe fatigue after doing this activity.  Reports the pain started in her hip on the right side.  She attributed this to the yard work.   She took aleve and also tried.  Ibuprofen the follow day   This did help initially.  Now she is having numbness in multiple areas of her body.  It is random and diffuse with intermittent durations.  Right leg numbess started that moved up to her right arm.  She has also had numbness to her left leg, hip and arms at varying times.  She is unable to connect any activity to this.  Nothing makes it better and she is unsure of anything making it worse.  She reports she is able to sleep at night and it does not wake her up at night.  In the [place] she has difficulty explaining how it feels.  She is asymptomatic today though some varient in sensation assessment.  She sees a chiropractor routinely for right ankle.  She is currently doing PT to help with the ankle.  Notable, history of mental health variants in the past.  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  She is not taking any medications for this and denies any mental health history or concerns in OV today.    Past Medical History:  Diagnosis Date  . Allergy   . Anxiety   . Arthritis   . Hyperlipidemia 05/12/2018  . 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 Thyroid disease      Allergies  Allergen Reactions  . Hydrocodone Itching  . Oxycodone-Acetaminophen   . Penicillins Swelling    Current Outpatient Medications on File Prior to Visit  Medication Sig  . CALCIUM CITRATE PO Take 1,000 mg by mouth.  . Cholecalciferol (VITAMIN D PO) Take 10,000 Units by mouth daily.   . cyclobenzaprine (FLEXERIL) 5 MG tablet Take 1 tablet (5 mg total) by mouth 3 (three) times daily as needed for muscle spasms.  Marland Kitchen imiquimod (ALDARA) 5 % cream Apply topically 3 (three) times a week.  . levothyroxine (SYNTHROID) 75 MCG tablet TAKE 1 TABLET BY MOUTH DAILY ON AN EMPTY STOMACH WITH ONLY WATER FOR 30 MINUTES& NO ANTACID MEDS, CALCIUM OR MAGNESIUM FOR 4 HOURS AND AVOID BIOTIN  .  magnesium 30 MG tablet Take 30 mg by mouth 2 (two) times daily.  . Multiple Vitamin (MULTIVITAMIN WITH MINERALS) TABS tablet Take 1 tablet by mouth daily.  . Omega-3 Fatty Acids (FISH OIL) 1000 MG CPDR Take by mouth.  . promethazine (PHENERGAN) 25 MG tablet TAKE 1 TABLET BY MOUTH EVERY 6 HOURS AS NEEDED FOR NAUSEA OR HEADACHE  . sucralfate (CARAFATE) 1 g tablet Take 1 tablet (1 g total) by mouth 4 (four) times daily. (Patient taking differently: Take 1 g by mouth 4 (four) times daily. Doesn't take consistently)  . temazepam (RESTORIL) 30 MG capsule TAKE 1 CAPSULE(30 MG) BY MOUTH AT BEDTIME AS NEEDED FOR SLEEP  . Turmeric 450 MG CAPS Take by mouth.   No current facility-administered medications on file prior to visit.    ROS: all negative except above.   Physical Exam:  There were no vitals taken for this visit.  General Appearance: Well nourished, in no apparent distress. Eyes: PERRLA, EOMs, conjunctiva no swelling or erythema Sinuses: No Frontal/maxillary tenderness ENT/Mouth: Ext aud canals clear, TMs without erythema, bulging. No erythema, swelling, or exudate on post pharynx.  Tonsils not swollen or erythematous. Hearing normal.  Neck: Supple, thyroid normal.  Respiratory: Respiratory effort normal, BS equal bilaterally without rales, rhonchi, wheezing or stridor.  Cardio: RRR with no MRGs. Brisk peripheral pulses without edema.  Abdomen: Soft, + BS.  Non tender, no guarding, rebound, hernias, masses. Lymphatics: Non tender without lymphadenopathy.  Musculoskeletal: Full ROM, 5/5 strength, normal gait.  Skin: Warm, dry without rashes, lesions, ecchymosis.  Neuro: Cranial nerves intact. Normal muscle tone, no cerebellar symptoms. Sensation intact. Decreased on right upper extremity and left lower extremity.  No clonus.  Psych: Awake and oriented X 3, normal affect, Insight and Judgment appropriate.     Garnet Sierras, NP 1:46 PM [facility]

## 2019-06-15 DIAGNOSIS — M9903 Segmental and somatic dysfunction of lumbar region: Secondary | ICD-10-CM | POA: Diagnosis not present

## 2019-06-15 DIAGNOSIS — M9902 Segmental and somatic dysfunction of thoracic region: Secondary | ICD-10-CM | POA: Diagnosis not present

## 2019-06-15 DIAGNOSIS — M9904 Segmental and somatic dysfunction of sacral region: Secondary | ICD-10-CM | POA: Diagnosis not present

## 2019-06-15 DIAGNOSIS — M9901 Segmental and somatic dysfunction of cervical region: Secondary | ICD-10-CM | POA: Diagnosis not present

## 2019-06-15 LAB — SEDIMENTATION RATE: Sed Rate: 6 mm/h (ref 0–20)

## 2019-06-15 LAB — C-REACTIVE PROTEIN: CRP: 0.5 mg/L (ref <8.0)

## 2019-06-15 LAB — RHEUMATOID FACTOR: Rheumatoid fact SerPl-aCnc: <14 IU/mL (ref <14)

## 2019-06-16 ENCOUNTER — Ambulatory Visit: Payer: BC Managed Care – PPO | Admitting: Adult Health

## 2019-06-16 ENCOUNTER — Other Ambulatory Visit: Payer: Self-pay | Admitting: Adult Health Nurse Practitioner

## 2019-06-16 DIAGNOSIS — R202 Paresthesia of skin: Secondary | ICD-10-CM

## 2019-06-16 DIAGNOSIS — W57XXXD Bitten or stung by nonvenomous insect and other nonvenomous arthropods, subsequent encounter: Secondary | ICD-10-CM

## 2019-06-17 ENCOUNTER — Other Ambulatory Visit: Payer: BC Managed Care – PPO

## 2019-06-17 ENCOUNTER — Other Ambulatory Visit: Payer: Self-pay

## 2019-06-17 ENCOUNTER — Other Ambulatory Visit: Payer: Self-pay | Admitting: Adult Health Nurse Practitioner

## 2019-06-17 DIAGNOSIS — M791 Myalgia, unspecified site: Secondary | ICD-10-CM | POA: Diagnosis not present

## 2019-06-17 DIAGNOSIS — R202 Paresthesia of skin: Secondary | ICD-10-CM | POA: Diagnosis not present

## 2019-06-20 DIAGNOSIS — M9904 Segmental and somatic dysfunction of sacral region: Secondary | ICD-10-CM | POA: Diagnosis not present

## 2019-06-20 DIAGNOSIS — M9901 Segmental and somatic dysfunction of cervical region: Secondary | ICD-10-CM | POA: Diagnosis not present

## 2019-06-20 DIAGNOSIS — M9902 Segmental and somatic dysfunction of thoracic region: Secondary | ICD-10-CM | POA: Diagnosis not present

## 2019-06-20 DIAGNOSIS — M9903 Segmental and somatic dysfunction of lumbar region: Secondary | ICD-10-CM | POA: Diagnosis not present

## 2019-06-20 LAB — B. BURGDORFI ANTIBODIES BY WB

## 2019-06-20 LAB — ANA: Anti Nuclear Antibody (ANA): NEGATIVE

## 2019-06-27 ENCOUNTER — Other Ambulatory Visit: Payer: Self-pay | Admitting: Adult Health

## 2019-06-27 MED ORDER — MELOXICAM 15 MG PO TABS: ORAL_TABLET | ORAL | 1 refills | Status: DC

## 2019-06-29 ENCOUNTER — Other Ambulatory Visit: Payer: Self-pay | Admitting: Adult Health

## 2019-06-29 MED ORDER — GABAPENTIN 100 MG PO CAPS: 100.0000 mg | ORAL_CAPSULE | Freq: Three times a day (TID) | ORAL | 2 refills | Status: DC | PRN

## 2019-07-08 DIAGNOSIS — Z30431 Encounter for routine checking of intrauterine contraceptive device: Secondary | ICD-10-CM | POA: Diagnosis not present

## 2019-07-16 ENCOUNTER — Other Ambulatory Visit: Payer: Self-pay | Admitting: Adult Health

## 2019-07-16 MED ORDER — GABAPENTIN 300 MG PO CAPS: 300.0000 mg | ORAL_CAPSULE | Freq: Three times a day (TID) | ORAL | 2 refills | Status: DC | PRN

## 2019-07-21 NOTE — Progress Notes (Signed)
Assessment and Plan:  Jamaiyah was seen today for ankle pain.  Diagnoses and all orders for this visit:  Acute pain of right foot Pain free today at time of exam; she is very concerned about explaining pain and anticipating plan should pain resume.  With hx of recurrent injury, hx of arthroscopic surgery, reasonable to follow up with xray, possible underlying arthritis or bone spur that could cause intermittent flares of pain when aggravated Discussed aternately may have been a mild sprain which has since resolved and would not be visible on plain imaging, exam is normal today without laxity or pain If pain reoccurs, reasonable to wear brace if suspects sprain or strain, apply ice, manage inflammation/pain with topical voltaren/diclofenac, tylenol, sparing meloxicam (take on full stomach, lowest dose necessary).  If obvious arthritis or structural etiology consider referral to podiatry vs ortho if recurrent flares for steroid injection She is reassured and in agreement with the above plan -     DG Foot Complete Right; Future  Further disposition pending results of labs. Discussed med's effects and SE's.   Over 15 minutes of exam, counseling, chart review, and critical decision making was performed.   Future Appointments  Date Time Provider [place]  07/27/2019 10:30 AM Sater, Nanine Means, MD [facility] None  11/16/2019  8:45 AM Liane Comber, NP [facility] None  05/17/2020  2:00 PM Liane Comber, NP [facility] None    ------------------------------------------------------------------------------------------------------------------   HPI BP 116/82   Pulse 84   Temp 97.6 F (36.4 C)   Resp 16   Ht 5\' 1"  (1.549 m)   Wt 138 lb 6.4 oz (62.8 kg)   BMI 26.15 kg/m   44 y.o.female presents for evaluation of 2-3 weeks of ankle pain R > L. She recalls remote history of sprain in ankles as teens, lots of injuries playing baseball, basketball, tennis, some track. Reports history of R  ankle arthroscopic surgery in 2000 up in [place], "just cleaning things out." Since that time hasn't had notable problems with ankles.   She reports was out moving mulch on 4/11, started experiencing R ankle pain that began a few days later, R ankle pain which has been "excruciating" and persistent until it apparently abruptly improved yesterday and today presents denying any active symptoms. She reports pain as "intense ache" in dorsal/lateral areas of foot, inferior to lateral malleolus. She reports non-radiating pain, cannot think of aggravating activities, hurt regardless of weight bearing or ambulation. She reports has been wearing ankle braces believing she perhaps sprained her ankle. She has been taking tumeric antiinflammatory and rare occasional meloxicam (GI issues, struggles to tolerate). She tried using a topical "crystal" yesterday and believes this may have improved pain. She denies having any swelling, erythema at any point.   While she is pain free at time of evaluation, she is very concerned with what to do should this pain reoccur.   BMI is Body mass index is 26.15 kg/m. Wt Readings from Last 3 Encounters:  07/22/19 138 lb 6.4 oz (62.8 kg)  06/14/19 145 lb 6.4 oz (66 kg)  05/18/19 145 lb 12.8 oz (66.1 kg)    Past Medical History:  Diagnosis Date  . Allergy   . Anxiety   . Arthritis   . Hyperlipidemia 05/12/2018  . 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 Thyroid disease      Allergies  Allergen Reactions  . Hydrocodone Itching  . Oxycodone-Acetaminophen   .  Penicillins Swelling    Current Outpatient Medications on File Prior to Visit  Medication Sig  . Cholecalciferol (VITAMIN D PO) Take 10,000 Units by mouth daily.   . cyclobenzaprine (FLEXERIL) 5 MG tablet Take 1 tablet (5 mg total) by mouth 3 (three) times daily as needed for muscle spasms.  Marland Kitchen gabapentin (NEURONTIN)  300 MG capsule Take 1-2 capsules (300-600 mg total) by mouth 3 (three) times daily as needed. For pain  . levothyroxine (SYNTHROID) 75 MCG tablet TAKE 1 TABLET BY MOUTH DAILY ON AN EMPTY STOMACH WITH ONLY WATER FOR 30 MINUTES& NO ANTACID MEDS, CALCIUM OR MAGNESIUM FOR 4 HOURS AND AVOID BIOTIN  . magnesium 30 MG tablet Take 30 mg by mouth 2 (two) times daily.  . meloxicam (MOBIC) 15 MG tablet Take one daily with food for 2 weeks, can take with tylenol, can not take with aleve, iburpofen, then 1/2-1 tab/day as needed daily for pain  . Multiple Vitamin (MULTIVITAMIN WITH MINERALS) TABS tablet Take 1 tablet by mouth daily.  . Omega-3 Fatty Acids (FISH OIL) 1000 MG CPDR Take by mouth.  . promethazine (PHENERGAN) 25 MG tablet TAKE 1 TABLET BY MOUTH EVERY 6 HOURS AS NEEDED FOR NAUSEA OR HEADACHE  . sucralfate (CARAFATE) 1 g tablet Take 1 tablet (1 g total) by mouth 4 (four) times daily. (Patient taking differently: Take 1 g by mouth 4 (four) times daily. Doesn't take consistently)  . temazepam (RESTORIL) 30 MG capsule TAKE 1 CAPSULE(30 MG) BY MOUTH AT BEDTIME AS NEEDED FOR SLEEP  . Turmeric 450 MG CAPS Take by mouth.  Marland Kitchen CALCIUM CITRATE PO Take 1,000 mg by mouth.   No current facility-administered medications on file prior to visit.    ROS: all negative except above.   Physical Exam:  BP 116/82   Pulse 84   Temp 97.6 F (36.4 C)   Resp 16   Ht 5\' 1"  (1.549 m)   Wt 138 lb 6.4 oz (62.8 kg)   BMI 26.15 kg/m   General Appearance: Well nourished, in no apparent distress. Eyes: PERRLA, conjunctiva no swelling or erythema ENT/Mouth: Mask in place; Hearing normal.  Neck: Supple Respiratory: Respiratory effort normal  Cardio: Brisk peripheral pulses without edema.  Musculoskeletal: Full ROM, 5/5 strength, normal gait. R ankle with full ROM, no tenderness, erythema, joint or bony palpable abnormality or tenderness. Pain not elicited today on exam.  Skin: Warm, dry without rashes, lesions,  ecchymosis.  Neuro: Normal muscle tone, Sensation intact.  Psych: Awake and oriented X 3, normal affect, Insight and Judgment appropriate.     Izora Ribas, NP 11:22 AM [facility]

## 2019-07-22 ENCOUNTER — Ambulatory Visit: Payer: BC Managed Care – PPO | Admitting: Adult Health

## 2019-07-22 ENCOUNTER — Encounter: Payer: Self-pay | Admitting: Adult Health

## 2019-07-22 ENCOUNTER — Other Ambulatory Visit: Payer: Self-pay

## 2019-07-22 VITALS — BP 116/82 | HR 84 | Temp 97.6°F | Resp 16 | Ht 61.0 in | Wt 138.4 lb

## 2019-07-22 DIAGNOSIS — M79671 Pain in right foot: Secondary | ICD-10-CM | POA: Diagnosis not present

## 2019-07-22 NOTE — Patient Instructions (Addendum)
     [address]   Can try voltaren gel (diclofenac gel) - topical  If swollen, inflammed or aggravated after activity - try ice - wrap in towel and apply 15-20 min up to every 2 hours    If severe pain - meloxicam if can handle

## 2019-07-25 ENCOUNTER — Ambulatory Visit
Admission: RE | Admit: 2019-07-25 | Discharge: 2019-07-25 | Disposition: A | Discharge status: Home / Self Care | Payer: BC Managed Care – PPO | Source: Ambulatory Visit | Attending: Adult Health | Admitting: Adult Health

## 2019-07-25 DIAGNOSIS — M79671 Pain in right foot: Secondary | ICD-10-CM

## 2019-07-25 DIAGNOSIS — M19071 Primary osteoarthritis, right ankle and foot: Secondary | ICD-10-CM | POA: Diagnosis not present

## 2019-07-26 ENCOUNTER — Other Ambulatory Visit: Payer: Self-pay | Admitting: Adult Health

## 2019-07-27 ENCOUNTER — Encounter: Payer: Self-pay | Admitting: Neurology

## 2019-07-27 ENCOUNTER — Other Ambulatory Visit: Payer: Self-pay

## 2019-07-27 ENCOUNTER — Ambulatory Visit: Payer: BC Managed Care – PPO | Admitting: Neurology

## 2019-07-27 VITALS — BP 118/72 | HR 95 | Temp 97.0°F | Ht 61.5 in | Wt 137.0 lb

## 2019-07-27 DIAGNOSIS — M542 Cervicalgia: Secondary | ICD-10-CM

## 2019-07-27 DIAGNOSIS — R292 Abnormal reflex: Secondary | ICD-10-CM

## 2019-07-27 DIAGNOSIS — M5431 Sciatica, right side: Secondary | ICD-10-CM | POA: Insufficient documentation

## 2019-07-27 DIAGNOSIS — Z820 Family history of epilepsy and other diseases of the nervous system: Secondary | ICD-10-CM

## 2019-07-27 DIAGNOSIS — M549 Dorsalgia, unspecified: Secondary | ICD-10-CM

## 2019-07-27 HISTORY — DX: Sciatica, right side: M54.31

## 2019-07-27 MED ORDER — METHYLPREDNISOLONE 4 MG PO TABS: ORAL_TABLET | ORAL | 0 refills | Status: DC

## 2019-07-27 NOTE — Progress Notes (Signed)
[facility]  PATIENT: Charlotte Wise DOB: 06-22-1975  REFERRING DOCTOR OR PCP: Unk Pinto SOURCE: Patient, notes from primary care  _________________________________   HISTORICAL  CHIEF COMPLAINT:  Chief Complaint  Patient presents with  . New Patient (Initial Visit)    RM 13, alone.  Internal referral from Unk Pinto for intermittent numbness/myalgias. Moved heavy mulch on 06/02/19. For a fews days after she was very fatigued. Then for about 5 days, she had intense pain on her right leg. On 06/11/19, pain transitioned to numbness all over. She saw PCP Placed her on oral prednisone. She does not feel it helped much. Sx are now intermittent and not all over her body. Mid April she noticed her neck/back was in pain/numbness.     HISTORY OF PRESENT ILLNESS:  I had the pleasure of seeing your patient, Charlotte Wise, at [facility] for neurologic consultation regarding her numbness and pain  She is a 44 year old woman who has had numbness and pain the last 2 months.    She was moving mulch with a large bucket (multiple trips) 06/02/2019 and she felt very tired later that day.  She was so tired the next 5 days that she was sleeping more.   She started to have right leg >> arm pain about 5 days later.   A few days later (06/11/19) she started to experience total body pain, right > left.  The leg was most involved but she also noted symptoms in her flank.  Arms were involved to a lesser extent.   She also noted numbness in her neck and face.  She also noted spasms in her leg.  These symptoms were constant for a couple weeks.   She did not note weakness.    She noted no change in gait.  Bladder function was unchanged.   After the pain and numbness was constant for a while it changed to become more intermittent a few weeks ago.   However, she had more neck and back pain as the numbness improved.  Currently, she has neck pain and pain and numbness into  her legs when she sits a long time.   The facial numbness is intermittent.  Neck pain (burning quality) increases with leaning forward and doing exercises.  Pain also increased with sleeping on her side.   Gabapentin has helped the pain some.    Pain in her back is worse with prolonged sitting and better with getting up and moving.  .    She also notes reduced memory.   She often needs to take notes more to remember to do task at work.   Sometimes she has trouble recounting her day.   This has been going on for at least 20 years.   She does not feel it has drastically changed the past year.      She has not had any imaging studies.   She is fairly healthy except for hypothyroidism and depression.  She is active with walking, yard work.   She occasionally does light yoga.    She uses an inversion table.     Her mother has MS.     REVIEW OF SYSTEMS: Constitutional: No fevers, chills, sweats, or change in appetite.  She has insomnia Eyes: No visual changes, double vision, eye pain Ear, nose and throat: No hearing loss, ear pain, nasal congestion, sore throat Cardiovascular: No chest pain, palpitations Respiratory: No shortness of breath at rest or with exertion.  No wheezes GastrointestinaI: No nausea, vomiting, diarrhea, abdominal pain, fecal incontinence Genitourinary: No dysuria, urinary retention or frequency.  No nocturia. Musculoskeletal: No neck pain, back pain Integumentary: No rash, pruritus, skin lesions Neurological: as above Psychiatric: H/o depression Endocrine: No palpitations, diaphoresis, change in appetite, change in weigh or increased thirst Hematologic/Lymphatic: No anemia, purpura, petechiae. Allergic/Immunologic: No itchy/runny eyes, nasal congestion, recent allergic reactions, rashes  ALLERGIES: Allergies  Allergen Reactions  . Hydrocodone Itching  . Oxycodone-Acetaminophen   . Penicillins Swelling    HOME MEDICATIONS:  Current Outpatient Medications:  .   acetaminophen (TYLENOL) 500 MG tablet, Take 500 mg by mouth every 6 (six) hours as needed. Takes 0-6 per day depending on her pain level, Disp: , Rfl:  .  bismuth subsalicylate (PEPTO BISMOL) 262 MG/15ML suspension, Take 30 mLs by mouth every 6 (six) hours as needed., Disp: , Rfl:  .  gabapentin (NEURONTIN) 300 MG capsule, Take 1-2 capsules (300-600 mg total) by mouth 3 (three) times daily as needed. For pain, Disp: 180 capsule, Rfl: 2 .  ibuprofen (ADVIL) 200 MG tablet, Take 200 mg by mouth every 6 (six) hours as needed., Disp: , Rfl:  .  levothyroxine (SYNTHROID) 75 MCG tablet, TAKE 1 TABLET BY MOUTH DAILY ON AN EMPTY STOMACH WITH ONLY WATER FOR 30 MINUTES& NO ANTACID MEDS, CALCIUM OR MAGNESIUM FOR 4 HOURS AND AVOID BIOTIN (Patient taking differently: Take 75 mcg by mouth. TAKE 1 TABLET BY MOUTH DAILY ON AN EMPTY STOMACH WITH ONLY WATER FOR 30 MINUTES& NO ANTACID MEDS, CALCIUM OR MAGNESIUM FOR 4 HOURS AND AVOID BIOTIN. Takes 1/2 pill Monday and Thursday), Disp: 90 tablet, Rfl: 1 .  MAGNESIUM PO, Take 400 mg by mouth daily., Disp: , Rfl:  .  meloxicam (MOBIC) 15 MG tablet, Take one daily with food for 2 weeks, can take with tylenol, can not take with aleve, iburpofen, then 1/2-1 tab/day as needed daily for pain (Patient taking differently: Take 30 mg by mouth daily. Take one daily with food for 2 weeks, can take with tylenol, can not take with aleve, iburpofen, then 1/2-1 tab/day as needed daily for pain Takes 2 per day if she does not take naproxen), Disp: 30 tablet, Rfl: 1 .  Multiple Vitamins-Minerals (MULTIVITAMIN ADULTS PO), Take 1 tablet by mouth daily., Disp: , Rfl:  .  naproxen sodium (ALEVE) 220 MG tablet, Take 220 mg by mouth as needed. Takes 2-3 per day if she does not take meloxicam, Disp: , Rfl:  .  Omega 3-6-9 Fatty Acids (OMEGA-3-6-9 PO), Take 1 Dose by mouth daily., Disp: , Rfl:  .  sucralfate (CARAFATE) 1 g tablet, Take 1 tablet (1 g total) by mouth 4 (four) times daily. (Patient taking  differently: Take 1 g by mouth 4 (four) times daily. Doesn't take consistently), Disp: 120 tablet, Rfl: 1 .  temazepam (RESTORIL) 30 MG capsule, TAKE 1 CAPSULE(30 MG) BY MOUTH AT BEDTIME AS NEEDED FOR SLEEP, Disp: 30 capsule, Rfl: 0 .  VITAMIN D PO, Take 10,000 Units by mouth., Disp: , Rfl:   PAST MEDICAL HISTORY: Past Medical History:  Diagnosis Date  . Allergy   . Anxiety   . Arthritis   . Hyperlipidemia 05/12/2018  . Major depressive disorder, 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[place]) 04/15/2016  . 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 Thyroid disease     PAST SURGICAL HISTORY: Past Surgical History:  Procedure Laterality Date  . ANKLE ARTHROSCOPY Right 2000  . KNEE ARTHROSCOPY Left 1988  . KNEE ARTHROSCOPY  Right 1999  . LAPAROSCOPY  2001  . NASAL POLYP SURGERY  2006  . TONSILLECTOMY AND ADENOIDECTOMY  1981    FAMILY HISTORY: Family History  Problem Relation Age of Onset  . Alcohol abuse Father   . Drug abuse Father   . Thyroid cancer Maternal Grandmother        With mets to bone  . Heart disease Paternal Grandfather   . Stroke Paternal Grandfather   . Clotting disorder Mother        lupus antibody   . Alcohol abuse Mother   . Drug abuse Mother   . Kidney disease Maternal Grandfather   . Drug abuse Brother   . Alcohol abuse Brother   . Heart disease Brother     SOCIAL HISTORY:  Social History   Socioeconomic History  . Marital status: Married    Spouse name: Debroah Baller  . Number of children: 1  . Years of education: BS  . Highest education level: Not on file  Occupational History  . Occupation: not currently working   Tobacco Use  . Smoking status: Never Smoker  . Smokeless tobacco: Never Used  Substance and Sexual Activity  . Alcohol use: Yes    Alcohol/week: 0.0 standard drinks    Comment: Rare - 1-2 a month  . Drug use: No  . Sexual activity: Yes    Partners: Male    Birth control/protection: Condom  Other Topics Concern  . Not on file    Social History Narrative   Lives with husband and son   Caffeine use: No soda, a few cups of coffee or tea per month   Right handed    Social Determinants of Health   Financial Resource Strain:   . Difficulty of Paying Living Expenses:   Food Insecurity:   . Worried About Charity fundraiser in the Last Year:   . Arboriculturist in the Last Year:   Transportation Needs:   . Film/video editor (Medical):   Marland Kitchen Lack of Transportation (Non-Medical):   Physical Activity:   . Days of Exercise per Week:   . Minutes of Exercise per Session:   Stress:   . Feeling of Stress :   Social Connections:   . Frequency of Communication with Friends and Family:   . Frequency of Social Gatherings with Friends and Family:   . Attends Religious Services:   . Active Member of Clubs or Organizations:   . Attends Archivist Meetings:   Marland Kitchen Marital Status:   Intimate Partner Violence:   . Fear of Current or Ex-Partner:   . Emotionally Abused:   Marland Kitchen Physically Abused:   . Sexually Abused:      PHYSICAL EXAM  Vitals:   07/27/19 1018  BP: 118/72  Pulse: 95  Temp: (!) 97 F (36.1 C)  Weight: 137 lb (62.1 kg)  Height: 5' 1.5" (1.562 m)    Body mass index is 25.47 kg/m.   General: The patient is well-developed and well-nourished and in no acute distress  HEENT:  Head is [place]/AT.  Sclera are anicteric.    Neck: No carotid bruits are noted.  The neck is nontender.  Cardiovascular: The heart has a regular rate and rhythm with a normal S1 and S2. There were no murmurs, gallops or rubs.    Skin: Extremities are without rash or  edema.  Musculoskeletal:  Back is fairly nontender with good range of motion  Neurologic Exam  Mental status: The patient  is alert and oriented x 3 at the time of the examination. The patient has apparent normal recent and remote memory, with an apparently normal attention span and concentration ability.   Speech is normal.  Cranial nerves: Extraocular  movements are full   pupils are equal and reactive to light.  Facial symmetry is present. There is good facial sensation to soft touch bilaterally.Facial strength is normal.  Trapezius and sternocleidomastoid strength is normal. No dysarthria is noted.  The tongue is midline, and the patient has symmetric elevation of the soft palate. No obvious hearing deficits are noted.  Motor:  Muscle bulk is normal.   Tone is normal. Strength is  5 / 5 in all 4 extremities.   Sensory: Sensory testing is intact to pinprick, soft touch and vibration sensation in the arms but she had reduced sensation in the right leg relative to the left..  Coordination: Cerebellar testing reveals good finger-nose-finger and heel-to-shin bilaterally.  Gait and station: Station is normal.   Gait is normal. Tandem gait is mildly wide. Romberg is negative.   Reflexes: Deep tendon reflexes are symmetric and increased.  There is hyperreflexia in the legs with spread at the knees and a couple beats of nonsustained clonus at the ankles.   Plantar responses are flexor.    DIAGNOSTIC DATA (LABS, IMAGING, TESTING) - I reviewed patient records, labs, notes, testing and imaging myself where available.  Lab Results  Component Value Date   WBC 6.3 05/18/2019   HGB 12.5 05/18/2019   HCT 37.5 05/18/2019   MCV 95.9 05/18/2019   PLT 310 05/18/2019      Component Value Date/Time   NA 138 05/18/2019 1445   K 4.7 05/18/2019 1445   CL 104 05/18/2019 1445   CO2 28 05/18/2019 1445   GLUCOSE 103 (H) 05/18/2019 1445   BUN 10 05/18/2019 1445   CREATININE 0.82 05/18/2019 1445   CALCIUM 9.4 05/18/2019 1445   PROT 6.6 05/18/2019 1445   ALBUMIN 4.2 10/09/2016 1604   AST 15 05/18/2019 1445   ALT 9 05/18/2019 1445   ALKPHOS 55 10/09/2016 1604   BILITOT 0.9 05/18/2019 1445   GFRNONAA 87 05/18/2019 1445   GFRAA 101 05/18/2019 1445   Lab Results  Component Value Date   CHOL 165 05/18/2019   HDL 58 05/18/2019   LDLCALC 93 05/18/2019    TRIG 53 05/18/2019   CHOLHDL 2.8 05/18/2019   Lab Results  Component Value Date   HGBA1C 5.1 05/18/2019   Lab Results  Component Value Date   VITAMINB12 963 05/18/2019   Lab Results  Component Value Date   TSH 1.06 05/18/2019       ASSESSMENT AND PLAN  Neck pain - Plan: MR CERVICAL SPINE WO CONTRAST  Sciatica of right side - Plan: MR LUMBAR SPINE WO CONTRAST  Hyperreflexia - Plan: MR CERVICAL SPINE WO CONTRAST, MR THORACIC SPINE WO CONTRAST  Mid back pain - Plan: MR THORACIC SPINE WO CONTRAST  Family history of MS (multiple sclerosis)   In Summary, Ms. Onorato is a 44 year old woman who has had pain and numbness since lifting heavy loads in early March 2021.  On exam, she had hyperreflexia and we need to be concerned about a cervical or thoracic myelopathy contributing to her pain and hyperreflexia.  Additionally, she has a right-sided sciatica with back pain that has not responded to more than 6 weeks of medication and exercise/yoga.  Therefore we will check an MRI of the lumbar spine without contrast to  determine if there is a herniated disc or other problem that might need to be treated by an intervention.  In the meantime, she will continue gabapentin but I will prescribe an additional steroid pack to see if we can reduce her pain further.  If pain does not improve, consider a muscle relaxant.  She will return to see me in 2 months or sooner for new or worsening neurologic symptoms.  We will let her know the results of the MRI and schedule appropriate interventional referral or other testing based on the results.  Thank you for asking me to see Ms. Suh.  Please let me know whether further assistance with her or other patients in the future.  Taylynn Easton A. Felecia Shelling, MD, [id number], [id number] AM Certified in Neurology, Clinical Neurophysiology, Sleep Medicine and Neuroimaging  [facility] [address], [place], [place] [address] [phone]

## 2019-08-01 DIAGNOSIS — M9901 Segmental and somatic dysfunction of cervical region: Secondary | ICD-10-CM | POA: Diagnosis not present

## 2019-08-01 DIAGNOSIS — M9904 Segmental and somatic dysfunction of sacral region: Secondary | ICD-10-CM | POA: Diagnosis not present

## 2019-08-01 DIAGNOSIS — M9902 Segmental and somatic dysfunction of thoracic region: Secondary | ICD-10-CM | POA: Diagnosis not present

## 2019-08-01 DIAGNOSIS — M9903 Segmental and somatic dysfunction of lumbar region: Secondary | ICD-10-CM | POA: Diagnosis not present

## 2019-08-04 ENCOUNTER — Other Ambulatory Visit: Payer: Self-pay | Admitting: Adult Health

## 2019-08-04 MED ORDER — DOXYCYCLINE HYCLATE 100 MG PO CAPS
ORAL_CAPSULE | ORAL | 0 refills | Status: DC
Start: 2019-08-04 — End: 2019-09-19

## 2019-08-08 ENCOUNTER — Telehealth: Payer: Self-pay | Admitting: Neurology

## 2019-08-08 NOTE — Telephone Encounter (Signed)
spoke to the patient she would like to go to GI order sent there. she is aware they will reach out to her to schedule. [employer] Auth: [id number] (exp. 08/08/19 to 02/03/20)

## 2019-08-28 ENCOUNTER — Ambulatory Visit
Admission: RE | Admit: 2019-08-28 | Discharge: 2019-08-28 | Disposition: A | Discharge status: Home / Self Care | Payer: BC Managed Care – PPO | Source: Ambulatory Visit | Attending: Neurology | Admitting: Neurology

## 2019-08-28 DIAGNOSIS — M542 Cervicalgia: Secondary | ICD-10-CM | POA: Diagnosis not present

## 2019-08-28 DIAGNOSIS — R292 Abnormal reflex: Secondary | ICD-10-CM

## 2019-08-28 DIAGNOSIS — M549 Dorsalgia, unspecified: Secondary | ICD-10-CM

## 2019-08-28 DIAGNOSIS — M5431 Sciatica, right side: Secondary | ICD-10-CM

## 2019-09-01 ENCOUNTER — Telehealth: Payer: Self-pay | Admitting: Neurology

## 2019-09-01 NOTE — Telephone Encounter (Signed)
Pt would like a call with results to MRI as soon as they are available

## 2019-09-02 ENCOUNTER — Telehealth: Payer: Self-pay | Admitting: Neurology

## 2019-09-02 MED ORDER — METHYLPREDNISOLONE 4 MG PO TABS: ORAL_TABLET | ORAL | 0 refills | Status: DC

## 2019-09-02 MED ORDER — IMIPRAMINE HCL 25 MG PO TABS: 25.0000 mg | ORAL_TABLET | Freq: Every day | ORAL | 3 refills | Status: DC

## 2019-09-02 NOTE — Telephone Encounter (Signed)
I spoke to the patient about the MRI results.  She continues to have pain that is mostly in the right leg and right lower back.  The MRI just shows minimal disc bulging at L5-S1 but no nerve root compression or spinal stenosis.  She does have mild spinal stenosis and right greater than left foraminal narrowing at C5-C6 and C6-C7.  Occasionally she will have some neck pain and right arm pain but this is intermittent.  I will send in a prescription for a steroid pack and imipramine 25 mg.  She is taking gabapentin about 1500 to 1800 mg daily.  If she does not get a benefit and symptoms persist, consider NCV/EMG

## 2019-09-02 NOTE — Telephone Encounter (Signed)
I called to go over the results of the imaging studies but got voicemail.  I left a message that I would call later in the day.

## 2019-09-12 ENCOUNTER — Other Ambulatory Visit: Payer: BC Managed Care – PPO

## 2019-09-12 DIAGNOSIS — M545 Low back pain: Secondary | ICD-10-CM | POA: Diagnosis not present

## 2019-09-14 ENCOUNTER — Other Ambulatory Visit: Payer: Self-pay | Admitting: Obstetrics & Gynecology

## 2019-09-14 ENCOUNTER — Other Ambulatory Visit: Payer: Self-pay | Admitting: Adult Health

## 2019-09-14 DIAGNOSIS — Z1231 Encounter for screening mammogram for malignant neoplasm of breast: Secondary | ICD-10-CM

## 2019-09-15 ENCOUNTER — Ambulatory Visit: Payer: BC Managed Care – PPO | Admitting: Adult Health

## 2019-09-19 ENCOUNTER — Other Ambulatory Visit: Payer: Self-pay

## 2019-09-19 ENCOUNTER — Ambulatory Visit: Payer: Self-pay | Admitting: Adult Health

## 2019-09-19 ENCOUNTER — Encounter: Payer: Self-pay | Admitting: Adult Health

## 2019-09-19 VITALS — BP 120/70 | HR 80 | Temp 97.3°F | Wt 144.0 lb

## 2019-09-19 DIAGNOSIS — S161XXA Strain of muscle, fascia and tendon at neck level, initial encounter: Secondary | ICD-10-CM

## 2019-09-19 DIAGNOSIS — Z041 Encounter for examination and observation following transport accident: Secondary | ICD-10-CM
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
## 2019-09-20 ENCOUNTER — Other Ambulatory Visit: Payer: Self-pay | Admitting: Adult Health

## 2019-09-21 DIAGNOSIS — M545 Low back pain: Secondary | ICD-10-CM | POA: Diagnosis not present

## 2019-09-30 DIAGNOSIS — M9904 Segmental and somatic dysfunction of sacral region: Secondary | ICD-10-CM | POA: Diagnosis not present

## 2019-09-30 DIAGNOSIS — M9901 Segmental and somatic dysfunction of cervical region: Secondary | ICD-10-CM | POA: Diagnosis not present

## 2019-09-30 DIAGNOSIS — M9902 Segmental and somatic dysfunction of thoracic region: Secondary | ICD-10-CM | POA: Diagnosis not present

## 2019-09-30 DIAGNOSIS — M9903 Segmental and somatic dysfunction of lumbar region: Secondary | ICD-10-CM | POA: Diagnosis not present

## 2019-09-30 DIAGNOSIS — M545 Low back pain: Secondary | ICD-10-CM | POA: Diagnosis not present

## 2019-10-05 DIAGNOSIS — M545 Low back pain: Secondary | ICD-10-CM | POA: Diagnosis not present

## 2019-10-06 DIAGNOSIS — M9901 Segmental and somatic dysfunction of cervical region: Secondary | ICD-10-CM | POA: Diagnosis not present

## 2019-10-06 DIAGNOSIS — M9904 Segmental and somatic dysfunction of sacral region: Secondary | ICD-10-CM | POA: Diagnosis not present

## 2019-10-06 DIAGNOSIS — M9903 Segmental and somatic dysfunction of lumbar region: Secondary | ICD-10-CM | POA: Diagnosis not present

## 2019-10-06 DIAGNOSIS — M9902 Segmental and somatic dysfunction of thoracic region: Secondary | ICD-10-CM | POA: Diagnosis not present

## 2019-10-19 ENCOUNTER — Encounter: Payer: Self-pay | Admitting: Neurology

## 2019-10-19 ENCOUNTER — Other Ambulatory Visit: Payer: Self-pay

## 2019-10-19 ENCOUNTER — Ambulatory Visit: Payer: BC Managed Care – PPO | Admitting: Neurology

## 2019-10-19 VITALS — BP 111/69 | HR 79 | Ht 61.5 in | Wt 147.0 lb

## 2019-10-19 DIAGNOSIS — Z820 Family history of epilepsy and other diseases of the nervous system: Secondary | ICD-10-CM

## 2019-10-19 DIAGNOSIS — M549 Dorsalgia, unspecified: Secondary | ICD-10-CM

## 2019-10-19 DIAGNOSIS — M5431 Sciatica, right side: Secondary | ICD-10-CM

## 2019-10-19 DIAGNOSIS — M79604 Pain in right leg: Secondary | ICD-10-CM | POA: Diagnosis not present

## 2019-10-19 DIAGNOSIS — M542 Cervicalgia: Secondary | ICD-10-CM | POA: Diagnosis not present

## 2019-10-19 MED ORDER — AMITRIPTYLINE HCL 25 MG PO TABS
ORAL_TABLET | ORAL | 3 refills | Status: DC
Start: 2019-10-19 — End: 2020-01-03

## 2019-10-19 MED ORDER — GABAPENTIN 300 MG PO CAPS: 900.0000 mg | ORAL_CAPSULE | Freq: Three times a day (TID) | ORAL | 3 refills | Status: DC | PRN

## 2019-10-19 NOTE — Progress Notes (Signed)
[facility]  PATIENT: Charlotte Wise DOB: 07-Jul-1975  REFERRING DOCTOR OR PCP: Unk Pinto SOURCE: Patient, notes from primary care  _________________________________   HISTORICAL  CHIEF COMPLAINT:  Chief Complaint  Patient presents with  . Follow-up    RM 12, alone. Last seen 07/27/19. Here to f/u on neck pain/back pain/sciatica. Still having pain, nothing new    HISTORY OF PRESENT ILLNESS:   Charlotte Wise is a 44 year old woman who has had numbness and pain since March 2021.      Update 10/19/2019: She continues to report pain.  Pain had been mostly in the right leg with the worse pain in the medial right lower leg and tingling in the outer lower leg and foot.   Since a long car ride, pain has increased.  MRI lumbar just showed mild bulging at L5-S1 but no nerve root compression.    She is doing therapy and has been given some exercises to do.  Sh also is doing a laser (class 4) therapy and feels better with that for a while after each session.    We had started imipramine 25 mg once a night, she increased to a few a day and felt leg pain was better but her headaches.   She is also on gabapentin 900 mg po tid.    She also has some neck pain.   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  There was foraminal narrowing at these levels.  Could affect right C7 nerve root.  History of back and leg pain pain: She was moving mulch with a large bucket (multiple trips) 06/02/2019 and she felt very tired later that day.  She was so tired the next 5 days that she was sleeping more.   She started to have right leg >> arm pain about 5 days later.   A few days later (06/11/19) she started to experience total body pain, right > left.  The leg was most involved but she also noted symptoms in her flank.  Arms were involved to a lesser extent.   She also noted numbness in her neck and face.  She also noted spasms in her  leg.  These symptoms were constant for a couple weeks.   She did not note weakness.    She noted no change in gait.  Bladder function was unchanged.   After the pain and numbness was constant for a while it changed to become more intermittent a few weeks ago.   However, she had more neck and back pain as the numbness improved.  Currently, she has neck pain and pain and numbness into her legs when she sits a long time.   The facial numbness is intermittent.  Neck pain (burning quality) increases with leaning forward and doing exercises.  Pain also increased with sleeping on her side.   Gabapentin has helped the pain some.    Pain in her back is worse with prolonged sitting and better with getting up and moving.  .    She also notes reduced memory.   She often needs to take notes more to remember to do task at work.   Sometimes she has trouble recounting her day.   This has been going on for at least 20 years.   She does not feel it has drastically changed the past year.      She has not had any imaging studies.  She is fairly healthy except for hypothyroidism and depression.  She is active with walking, yard work.   She occasionally does light yoga.    She uses an inversion table.     Her mother has MS.     REVIEW OF SYSTEMS: Constitutional: No fevers, chills, sweats, or change in appetite.  She has insomnia Eyes: No visual changes, double vision, eye pain Ear, nose and throat: No hearing loss, ear pain, nasal congestion, sore throat Cardiovascular: No chest pain, palpitations Respiratory: No shortness of breath at rest or with exertion.   No wheezes GastrointestinaI: No nausea, vomiting, diarrhea, abdominal pain, fecal incontinence Genitourinary: No dysuria, urinary retention or frequency.  No nocturia. Musculoskeletal: No neck pain, back pain Integumentary: No rash, pruritus, skin lesions Neurological: as above Psychiatric: H/o depression Endocrine: No palpitations, diaphoresis, change in  appetite, change in weigh or increased thirst Hematologic/Lymphatic: No anemia, purpura, petechiae. Allergic/Immunologic: No itchy/runny eyes, nasal congestion, recent allergic reactions, rashes  ALLERGIES: Allergies  Allergen Reactions  . Hydrocodone Itching  . Oxycodone-Acetaminophen   . Penicillins Swelling    HOME MEDICATIONS:  Current Outpatient Medications:  .  acetaminophen (TYLENOL) 500 MG tablet, Take 500 mg by mouth every 6 (six) hours as needed. Takes 0-6 per day depending on her pain level, Disp: , Rfl:  .  bismuth subsalicylate (PEPTO BISMOL) 262 MG/15ML suspension, Take 30 mLs by mouth every 6 (six) hours as needed., Disp: , Rfl:  .  gabapentin (NEURONTIN) 300 MG capsule, Take 3 capsules (900 mg total) by mouth 3 (three) times daily as needed. For pain, Disp: 270 capsule, Rfl: 3 .  ibuprofen (ADVIL) 200 MG tablet, Take 200 mg by mouth every 6 (six) hours as needed., Disp: , Rfl:  .  imipramine (TOFRANIL) 25 MG tablet, Take 1 tablet (25 mg total) by mouth at bedtime., Disp: 30 tablet, Rfl: 3 .  levothyroxine (SYNTHROID) 75 MCG tablet, TAKE 1 TABLET BY MOUTH DAILY ON AN EMPTY STOMACH WITH ONLY WATER FOR 30 MINUTES& NO ANTACID MEDS, CALCIUM OR MAGNESIUM FOR 4 HOURS AND AVOID BIOTIN (Patient taking differently: Take 75 mcg by mouth. TAKE 1 TABLET BY MOUTH DAILY ON AN EMPTY STOMACH WITH ONLY WATER FOR 30 MINUTES& NO ANTACID MEDS, CALCIUM OR MAGNESIUM FOR 4 HOURS AND AVOID BIOTIN. Takes 1/2 pill Monday and Thursday), Disp: 90 tablet, Rfl: 1 .  MAGNESIUM PO, Take 400 mg by mouth daily., Disp: , Rfl:  .  meloxicam (MOBIC) 15 MG tablet, Take one daily with food for 2 weeks, can take with tylenol, can not take with aleve, iburpofen, then 1/2-1 tab/day as needed daily for pain, Disp: 30 tablet, Rfl: 1 .  Multiple Vitamins-Minerals (MULTIVITAMIN ADULTS PO), Take 1 tablet by mouth daily., Disp: , Rfl:  .  naproxen sodium (ALEVE) 220 MG tablet, Take 220 mg by mouth as needed. Takes 2-3 per  day if she does not take meloxicam, Disp: , Rfl:  .  Omega 3-6-9 Fatty Acids (OMEGA-3-6-9 PO), Take 1 Dose by mouth daily., Disp: , Rfl:  .  temazepam (RESTORIL) 30 MG capsule, TAKE 1 CAPSULE(30 MG) BY MOUTH AT BEDTIME AS NEEDED FOR SLEEP, Disp: 30 capsule, Rfl: 0 .  VITAMIN D PO, Take 10,000 Units by mouth., Disp: , Rfl:  .  amitriptyline (ELAVIL) 25 MG tablet, Take up to tid., Disp: 90 tablet, Rfl: 3 .  sucralfate (CARAFATE) 1 g tablet, Take 1 tablet (1 g total) by mouth 4 (four) times daily. (Patient taking differently: Take 1 g by  mouth 4 (four) times daily. Doesn't take consistently), Disp: 120 tablet, Rfl: 1  PAST MEDICAL HISTORY: Past Medical History:  Diagnosis Date  . Allergy   . Anxiety   . Arthritis   . Hyperlipidemia 05/12/2018  . 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 Sciatica of right side 07/27/2019  . Thyroid disease     PAST SURGICAL HISTORY: Past Surgical History:  Procedure Laterality Date  . ANKLE ARTHROSCOPY Right 2000  . KNEE ARTHROSCOPY Left 1988  . KNEE ARTHROSCOPY Right 1999  . LAPAROSCOPY  2001  . NASAL POLYP SURGERY  2006  . TONSILLECTOMY AND ADENOIDECTOMY  1981    FAMILY HISTORY: Family History  Problem Relation Age of Onset  . Alcohol abuse Father   . Drug abuse Father   . Thyroid cancer Maternal Grandmother        With mets to bone  . Heart disease Paternal Grandfather   . Stroke Paternal Grandfather   . Clotting disorder Mother        lupus antibody   . Alcohol abuse Mother   . Drug abuse Mother   . Kidney disease Maternal Grandfather   . Drug abuse Brother   . Alcohol abuse Brother   . Heart disease Brother     SOCIAL HISTORY:  Social History   Socioeconomic History  . Marital status: Married    Spouse name: Debroah Baller  . Number of children: 1  . Years of education: BS  . Highest education level: Not on file  Occupational History  .  Occupation: not currently working   Tobacco Use  . Smoking status: Never Smoker  . Smokeless tobacco: Never Used  Vaping Use  . Vaping Use: Never used  Substance and Sexual Activity  . Alcohol use: Yes    Alcohol/week: 0.0 standard drinks    Comment: Rare - 1-2 a month  . Drug use: No  . Sexual activity: Yes    Partners: Male    Birth control/protection: Condom  Other Topics Concern  . Not on file  Social History Narrative   Lives with husband and son   Caffeine use: No soda, a few cups of coffee or tea per month   Right handed    Social Determinants of Health   Financial Resource Strain:   . Difficulty of Paying Living Expenses:   Food Insecurity:   . Worried About Charity fundraiser in the Last Year:   . Arboriculturist in the Last Year:   Transportation Needs:   . Film/video editor (Medical):   Marland Kitchen Lack of Transportation (Non-Medical):   Physical Activity:   . Days of Exercise per Week:   . Minutes of Exercise per Session:   Stress:   . Feeling of Stress :   Social Connections:   . Frequency of Communication with Friends and Family:   . Frequency of Social Gatherings with Friends and Family:   . Attends Religious Services:   . Active Member of Clubs or Organizations:   . Attends Archivist Meetings:   Marland Kitchen Marital Status:   Intimate Partner Violence:   . Fear of Current or Ex-Partner:   . Emotionally Abused:   Marland Kitchen Physically Abused:   . Sexually Abused:      PHYSICAL EXAM  Vitals:   10/19/19 0941  BP: 111/69  Pulse: 79  Weight: 147 lb (66.7 kg)  Height: 5' 1.5" (1.562 m)  Body mass index is 27.33 kg/m.   General: The patient is well-developed and well-nourished and in no acute distress  HEENT:  Head is [place]/AT.  Sclera are anicteric.    Musculoskeletal:  Back is fairly nontender with good range of motion.  She has mild paraspinal tenderness.  Neurologic Exam  Mental status: The patient is alert and oriented x 3 at the time of the  examination. The patient has apparent normal recent and remote memory, with an apparently normal attention span and concentration ability.   Speech is normal.  Cranial nerves: Extraocular movements are full   facial strength is normal.  Trapezius and sternocleidomastoid strength is normal. No dysarthria is noted.  . No obvious hearing deficits are noted.  Motor:  Muscle bulk is normal.   Tone is normal. Strength is  5 / 5 in all 4 extremities.   Sensory: Sensory testing is intact to pinprick, soft touch and vibration sensation in the arms but she had reduced sensation in the right leg relative to the left..  Coordination: Cerebellar testing reveals good finger-nose-finger and heel-to-shin bilaterally.  Gait and station: Station is normal.   Gait is normal. Tandem gait is mildly wide. Romberg is negative.   Reflexes: Deep tendon reflexes are symmetric and increased.  DTRs are 3+ at the knees and ankles and 2+ in the arms.    DIAGNOSTIC DATA (LABS, IMAGING, TESTING) - I reviewed patient records, labs, notes, testing and imaging myself where available.  Lab Results  Component Value Date   WBC 6.3 05/18/2019   HGB 12.5 05/18/2019   HCT 37.5 05/18/2019   MCV 95.9 05/18/2019   PLT 310 05/18/2019      Component Value Date/Time   NA 138 05/18/2019 1445   K 4.7 05/18/2019 1445   CL 104 05/18/2019 1445   CO2 28 05/18/2019 1445   GLUCOSE 103 (H) 05/18/2019 1445   BUN 10 05/18/2019 1445   CREATININE 0.82 05/18/2019 1445   CALCIUM 9.4 05/18/2019 1445   PROT 6.6 05/18/2019 1445   ALBUMIN 4.2 10/09/2016 1604   AST 15 05/18/2019 1445   ALT 9 05/18/2019 1445   ALKPHOS 55 10/09/2016 1604   BILITOT 0.9 05/18/2019 1445   GFRNONAA 87 05/18/2019 1445   GFRAA 101 05/18/2019 1445   Lab Results  Component Value Date   CHOL 165 05/18/2019   HDL 58 05/18/2019   LDLCALC 93 05/18/2019   TRIG 53 05/18/2019   CHOLHDL 2.8 05/18/2019   Lab Results  Component Value Date   HGBA1C 5.1 05/18/2019     Lab Results  Component Value Date   VITAMINB12 963 05/18/2019   Lab Results  Component Value Date   TSH 1.06 05/18/2019       ASSESSMENT AND PLAN  Sciatica of right side - Plan: NCV with EMG(electromyography)  Pain of right lower extremity - Plan: NCV with EMG(electromyography)  Neck pain  Mid back pain  Family history of MS (multiple sclerosis)  1.   Gabapentin 900 mg 3 times daily and amitriptyline titrate up to 75 mg. 2.   Continue exercises. 3.   NCV/EMG study to further evaluate source of right leg pain.  MRI of the lumbar spine was essentially normal for age. 4.   She will return to see me for the NCV/EMG study or call sooner if there are new or worsening neurologic symptoms. Jade Burright A. Felecia Shelling, MD, [facility] 09/12/2977, 8:92 PM Certified in Neurology, Clinical Neurophysiology, Sleep Medicine and Neuroimaging  [facility] [phone]  [address], [place], [place] [address] [phone]

## 2019-10-28 ENCOUNTER — Other Ambulatory Visit: Payer: Self-pay

## 2019-10-28 ENCOUNTER — Ambulatory Visit
Admission: RE | Admit: 2019-10-28 | Discharge: 2019-10-28 | Disposition: A | Discharge status: Home / Self Care | Payer: BC Managed Care – PPO | Source: Ambulatory Visit | Attending: Adult Health | Admitting: Adult Health

## 2019-10-28 DIAGNOSIS — Z1231 Encounter for screening mammogram for malignant neoplasm of breast: Secondary | ICD-10-CM | POA: Diagnosis not present

## 2019-10-31 DIAGNOSIS — M545 Low back pain: Secondary | ICD-10-CM | POA: Diagnosis not present

## 2019-11-13 NOTE — Progress Notes (Signed)
FOLLOW UP  Assessment and Plan:   Hypothyroidism continue medications the same pending lab results reminded to take on an empty stomach 30-55mins before food.  check TSH level  Cholesterol Currently at goal by lifestyle Continue low cholesterol diet and exercise.  Check lipid panel at [facility]  Overweight - BMI 27 Long discussion about weight loss, diet, and exercise Recommended diet heavy in fruits and veggies and low in animal meats, cheeses, and dairy products, appropriate calorie intake Discussed ideal weight for height Patient will work on increasing high fiber foods Will follow up in 3 months  Vitamin D Def At goal at last visit; continue supplementation to maintain goal of 60-100 Defer Vit D level  Depression/anxiety Off of medications; sx remain well controlled by lifestyle. Will give 1 time xanax for dental procedure.  Lifestyle discussed: diet/exerise, sleep hygiene, stress management, hydration   Continue diet and meds as discussed. Further disposition pending results of labs. Discussed med's effects and SE's.   Over 30 minutes of exam, counseling, chart review, and critical decision making was performed.   Future Appointments  Date Time Provider [place]  11/22/2019  9:30 AM Belinda Block, CMA [facility] None  11/22/2019 10:15 AM Sater, Nanine Means, MD [facility] None  05/17/2020  2:00 PM Liane Comber, NP [facility] None    ----------------------------------------------------------------------------------------------------------------------  HPI 44 y.o. female  presents for 6 month follow up on cholesterol, hypothyroid, depression, weight and vitamin D deficiency.   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 She does have dental procedure coming up, hx of traumatic experience and fairly anxious, requesting one time anxiety med option.   Recently established with Dr. Felecia Shelling due to neck and back pain with paresthesias for several months, recent  non-diagnostic MRI of cervical and lumbar spine were felt normal for age and non-diagnostic. She is pending NCV/EMGs. She is working with PT q2w in the interim, doing cold laser with benefit. She is taking amitriptyline, gabapentin.   BMI is Body mass index is 28.07 kg/m., she has been working on diet and exercise, going to the gym but limited time due to above pain.  Wt Readings from Last 3 Encounters:  11/16/19 151 lb (68.5 kg)  10/19/19 147 lb (66.7 kg)  09/19/19 144 lb (65.3 kg)   Her blood pressure has been controlled at home, today their BP is BP: 106/68  She does workout. She denies chest pain, shortness of breath, dizziness.   She is not on cholesterol medication. Her cholesterol is at goal by lifestyle modification. The cholesterol last visit was:   Lab Results  Component Value Date   CHOL 165 05/18/2019   HDL 58 05/18/2019   LDLCALC 93 05/18/2019   TRIG 53 05/18/2019   CHOLHDL 2.8 05/18/2019   She is on thyroid medication. Her medication was not changed last visit.   Lab Results  Component Value Date   TSH 1.06 05/18/2019    Patient is on Vitamin D supplement.   Lab Results  Component Value Date   VD25OH 64 05/18/2019        Current Medications:  Current Outpatient Medications on File Prior to Visit  Medication Sig  . acetaminophen (TYLENOL) 500 MG tablet Take 500 mg by mouth every 6 (six) hours as needed. Takes 0-6 per day depending on her pain level  . amitriptyline (ELAVIL) 25 MG tablet Take up to tid.  . bismuth subsalicylate (PEPTO BISMOL) 262 MG/15ML suspension Take 30 mLs by mouth every 6 (six) hours as needed.  Marland Kitchen  gabapentin (NEURONTIN) 300 MG capsule Take 3 capsules (900 mg total) by mouth 3 (three) times daily as needed. For pain  . ibuprofen (ADVIL) 200 MG tablet Take 200 mg by mouth every 6 (six) hours as needed.  Marland Kitchen levothyroxine (SYNTHROID) 75 MCG tablet TAKE 1 TABLET BY MOUTH DAILY ON AN EMPTY STOMACH WITH ONLY WATER FOR 30 MINUTES& NO ANTACID MEDS,  CALCIUM OR MAGNESIUM FOR 4 HOURS AND AVOID BIOTIN (Patient taking differently: Take 75 mcg by mouth. TAKE 1 TABLET BY MOUTH DAILY ON AN EMPTY STOMACH WITH ONLY WATER FOR 30 MINUTES& NO ANTACID MEDS, CALCIUM OR MAGNESIUM FOR 4 HOURS AND AVOID BIOTIN. Takes 1/2 pill Monday and Thursday)  . MAGNESIUM PO Take 400 mg by mouth daily.  . meloxicam (MOBIC) 15 MG tablet Take one daily with food for 2 weeks, can take with tylenol, can not take with aleve, iburpofen, then 1/2-1 tab/day as needed daily for pain  . Multiple Vitamins-Minerals (MULTIVITAMIN ADULTS PO) Take 1 tablet by mouth daily.  . naproxen sodium (ALEVE) 220 MG tablet Take 220 mg by mouth as needed. Takes 2-3 per day if she does not take meloxicam  . Omega 3-6-9 Fatty Acids (OMEGA-3-6-9 PO) Take 1 Dose by mouth daily.  . temazepam (RESTORIL) 30 MG capsule TAKE 1 CAPSULE(30 MG) BY MOUTH AT BEDTIME AS NEEDED FOR SLEEP  . VITAMIN D PO Take 10,000 Units by mouth.  Marland Kitchen imipramine (TOFRANIL) 25 MG tablet Take 1 tablet (25 mg total) by mouth at bedtime. (Patient not taking: Reported on 11/16/2019)  . sucralfate (CARAFATE) 1 g tablet Take 1 tablet (1 g total) by mouth 4 (four) times daily. (Patient taking differently: Take 1 g by mouth 4 (four) times daily. Doesn't take consistently)   No current facility-administered medications on file prior to visit.     Allergies:  Allergies  Allergen Reactions  . Hydrocodone Itching  . Oxycodone-Acetaminophen   . Penicillins Swelling     Medical History:  Past Medical History:  Diagnosis Date  . Allergy   . Anxiety   . Arthritis   . Hyperlipidemia 05/12/2018  . 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 Sciatica of right side 07/27/2019  . Thyroid disease    Family history- Reviewed and unchanged Social history- Reviewed and unchanged   Review of Systems:  Review of Systems  Constitutional: Negative for  malaise/fatigue and weight loss.  HENT: Negative for hearing loss and tinnitus.   Eyes: Negative for blurred vision and double vision.  Respiratory: Negative for cough, shortness of breath and wheezing.   Cardiovascular: Negative for chest pain, palpitations, orthopnea, claudication and leg swelling.  Gastrointestinal: Negative for abdominal pain, blood in stool, constipation, diarrhea, heartburn, melena, nausea and vomiting.  Genitourinary: Negative.   Musculoskeletal: Positive for back pain and neck pain. Negative for joint pain and myalgias.  Skin: Negative for rash.  Neurological: Positive for sensory change (R upper and lower arm intermittently, neuro following). Negative for dizziness, tingling, weakness and headaches.  Endo/Heme/Allergies: Negative for polydipsia.  Psychiatric/Behavioral: Negative.   All other systems reviewed and are negative.     Physical Exam: BP 106/68   Pulse 79   Temp 98.1 F (36.7 C)   Wt 151 lb (68.5 kg)   SpO2 98%   BMI 28.07 kg/m  Wt Readings from Last 3 Encounters:  11/16/19 151 lb (68.5 kg)  10/19/19 147 lb (66.7 kg)  09/19/19 144 lb (65.3 kg)  General Appearance: Well nourished, in no apparent distress. Eyes: PERRLA, EOMs, conjunctiva no swelling or erythema Sinuses: No Frontal/maxillary tenderness ENT/Mouth: Ext aud canals clear, TMs without erythema, bulging. No erythema, swelling, or exudate on post pharynx.  Tonsils not swollen or erythematous. Hearing normal.  Neck: Supple, thyroid normal.  Respiratory: Respiratory effort normal, BS equal bilaterally without rales, rhonchi, wheezing or stridor.  Cardio: RRR with no MRGs. Brisk peripheral pulses without edema.  Abdomen: Soft, + BS.  Non tender, no guarding, rebound, hernias, masses. Lymphatics: Non tender without lymphadenopathy.  Musculoskeletal: Full ROM, 5/5 strength, Normal gait Skin: Warm, dry without rashes, lesions, ecchymosis.  Neuro: Cranial nerves intact. No cerebellar  symptoms.  Psych: Awake and oriented X 3, normal affect, Insight and Judgment appropriate.    Izora Ribas, NP 8:57 AM [facility]

## 2019-11-16 ENCOUNTER — Other Ambulatory Visit: Payer: Self-pay

## 2019-11-16 ENCOUNTER — Ambulatory Visit: Payer: BC Managed Care – PPO | Admitting: Adult Health

## 2019-11-16 ENCOUNTER — Encounter: Payer: Self-pay | Admitting: Adult Health

## 2019-11-16 ENCOUNTER — Telehealth: Payer: Self-pay | Admitting: Neurology

## 2019-11-16 VITALS — BP 106/68 | HR 79 | Temp 98.1°F | Wt 151.0 lb

## 2019-11-16 DIAGNOSIS — E063 Autoimmune thyroiditis: Secondary | ICD-10-CM

## 2019-11-16 DIAGNOSIS — E785 Hyperlipidemia, unspecified: Secondary | ICD-10-CM | POA: Diagnosis not present

## 2019-11-16 DIAGNOSIS — E559 Vitamin D deficiency, unspecified: Secondary | ICD-10-CM | POA: Diagnosis not present

## 2019-11-16 DIAGNOSIS — E663 Overweight: Secondary | ICD-10-CM | POA: Diagnosis not present

## 2019-11-16 DIAGNOSIS — Z79899 Other long term (current) drug therapy: Secondary | ICD-10-CM

## 2019-11-16 DIAGNOSIS — F325 Major depressive disorder, single episode, in full remission: Secondary | ICD-10-CM

## 2019-11-16 DIAGNOSIS — E038 Other specified hypothyroidism: Secondary | ICD-10-CM

## 2019-11-16 LAB — COMPLETE METABOLIC PANEL WITH GFR
AG Ratio: 2.1 (calc) (ref 1.0–2.5)
ALT: 14 U/L (ref 6–29)
AST: 18 U/L (ref 10–35)
Albumin: 4.4 g/dL (ref 3.6–5.1)
Alkaline phosphatase (APISO): 41 U/L (ref 31–125)
BUN: 15 mg/dL (ref 7–25)
CO2: 30 mmol/L (ref 20–32)
Calcium: 9.2 mg/dL (ref 8.6–10.2)
Chloride: 101 mmol/L (ref 98–110)
Creat: 0.82 mg/dL (ref 0.50–1.10)
GFR, Est African American: 100 mL/min/{1.73_m2} (ref >=60)
GFR, Est Non African American: 86 mL/min/{1.73_m2} (ref >=60)
Globulin: 2.1 g/dL (calc) (ref 1.9–3.7)
Glucose, Bld: 62 mg/dL — ABNORMAL LOW (ref 65–99)
Potassium: 4.7 mmol/L (ref 3.5–5.3)
Sodium: 140 mmol/L (ref 135–146)
Total Bilirubin: 1.2 mg/dL (ref 0.2–1.2)
Total Protein: 6.5 g/dL (ref 6.1–8.1)

## 2019-11-16 LAB — CBC WITH DIFFERENTIAL/PLATELET
Absolute Monocytes: 461 cells/uL (ref 200–950)
Basophils Absolute: 61 cells/uL (ref 0–200)
Basophils Relative: 1.3 %
Eosinophils Absolute: 202 cells/uL (ref 15–500)
Eosinophils Relative: 4.3 %
HCT: 38.7 % (ref 35.0–45.0)
Hemoglobin: 13 g/dL (ref 11.7–15.5)
Lymphs Abs: 1683 cells/uL (ref 850–3900)
MCH: 33 pg (ref 27.0–33.0)
MCHC: 33.6 g/dL (ref 32.0–36.0)
MCV: 98.2 fL (ref 80.0–100.0)
MPV: 9.1 fL (ref 7.5–12.5)
Monocytes Relative: 9.8 %
Neutro Abs: 2294 cells/uL (ref 1500–7800)
Neutrophils Relative %: 48.8 %
Platelets: 305 10*3/uL (ref 140–400)
RBC: 3.94 10*6/uL (ref 3.80–5.10)
RDW: 11.8 % (ref 11.0–15.0)
Total Lymphocyte: 35.8 %
WBC: 4.7 10*3/uL (ref 3.8–10.8)

## 2019-11-16 LAB — TSH: TSH: 3.19 mIU/L

## 2019-11-16 MED ORDER — ALPRAZOLAM 0.5 MG PO TABS: 0.5000 mg | ORAL_TABLET | Freq: Once | ORAL | 0 refills | Status: AC

## 2019-11-16 NOTE — Telephone Encounter (Addendum)
Spoke with Dr. Felecia Shelling, ok to proceed with doing right arm and right leg for EMG/NCS. Called pt back and LVM letting her know. I added this in the appt notes and informed Elmyra Ricks for pt appt on 11/22/19.

## 2019-11-16 NOTE — Telephone Encounter (Signed)
We can do both legs but we never do 4 limbs

## 2019-11-16 NOTE — Telephone Encounter (Signed)
Pt called wanting to know if both her arms and legs can be checked during her NCV/EMG testing that she has scheduled 8/31. Please advise.

## 2019-11-16 NOTE — Patient Instructions (Signed)
Take 1 tab of xanax 1 hour prior to procedure; repeat 15 min prior if needed for severe anxiety. You will need to be driven to and back from your appointment   Alprazolam tablets What is this medicine? ALPRAZOLAM (al PRAY zoe lam) is a benzodiazepine. It is used to treat anxiety and panic attacks. This medicine may be used for other purposes; ask your health care provider or pharmacist if you have questions. COMMON BRAND NAME(S): Xanax What should I tell my health care provider before I take this medicine? They need to know if you have any of these conditions:  an alcohol or drug abuse problem  bipolar disorder, depression, psychosis or other mental health conditions  glaucoma  kidney or liver disease  lung or breathing disease  myasthenia gravis  Parkinson's disease  porphyria  seizures or a history of seizures  suicidal thoughts  an unusual or allergic reaction to alprazolam, other benzodiazepines, foods, dyes, or preservatives  pregnant or trying to get pregnant  breast-feeding How should I use this medicine? Take this medicine by mouth with a glass of water. Follow the directions on the prescription label. Take your medicine at regular intervals. Do not take it more often than directed. Do not stop taking except on your doctor's advice. A special MedGuide will be given to you by the pharmacist with each prescription and refill. Be sure to read this information carefully each time. Talk to your pediatrician regarding the use of this medicine in children. Special care may be needed. Overdosage: If you think you have taken too much of this medicine contact a poison control center or emergency room at once. NOTE: This medicine is only for you. Do not share this medicine with others. What if I miss a dose? If you miss a dose, take it as soon as you can. If it is almost time for your next dose, take only that dose. Do not take double or extra doses. What may interact with  this medicine? Do not take this medicine with any of the following medications:  certain antiviral medicines for HIV or AIDS like delavirdine, indinavir  certain medicines for fungal infections like ketoconazole and itraconazole  narcotic medicines for cough  sodium oxybate This medicine may also interact with the following medications:  alcohol  antihistamines for allergy, cough and cold  certain antibiotics like clarithromycin, erythromycin, isoniazid, rifampin, rifapentine, rifabutin, and troleandomycin  certain medicines for blood pressure, heart disease, irregular heart beat  certain medicines for depression, like amitriptyline, fluoxetine, sertraline  certain medicines for seizures like carbamazepine, oxcarbazepine, phenobarbital, phenytoin, primidone  cimetidine  cyclosporine  female hormones, like estrogens or progestins and birth control pills, patches, rings, or injections  general anesthetics like halothane, isoflurane, methoxyflurane, propofol  grapefruit juice  local anesthetics like lidocaine, pramoxine, tetracaine  medicines that relax muscles for surgery  narcotic medicines for pain  other antiviral medicines for HIV or AIDS  phenothiazines like chlorpromazine, mesoridazine, prochlorperazine, thioridazine This list may not describe all possible interactions. Give your health care provider a list of all the medicines, herbs, non-prescription drugs, or dietary supplements you use. Also tell them if you smoke, drink alcohol, or use illegal drugs. Some items may interact with your medicine. What should I watch for while using this medicine? Tell your doctor or health care professional if your symptoms do not start to get better or if they get worse. Do not stop taking except on your doctor's advice. You may develop a severe reaction. Your doctor  will tell you how much medicine to take. You may get drowsy or dizzy. Do not drive, use machinery, or do anything  that needs mental alertness until you know how this medicine affects you. To reduce the risk of dizzy and fainting spells, do not stand or sit up quickly, especially if you are an older patient. Alcohol may increase dizziness and drowsiness. Avoid alcoholic drinks. If you are taking another medicine that also causes drowsiness, you may have more side effects. Give your health care provider a list of all medicines you use. Your doctor will tell you how much medicine to take. Do not take more medicine than directed. Call emergency for help if you have problems breathing or unusual sleepiness. What side effects may I notice from receiving this medicine? Side effects that you should report to your doctor or health care professional as soon as possible:  allergic reactions like skin rash, itching or hives, swelling of the face, lips, or tongue  breathing problems  confusion  loss of balance or coordination  signs and symptoms of low blood pressure like dizziness; feeling faint or lightheaded, falls; unusually weak or tired  suicidal thoughts or other mood changes Side effects that usually do not require medical attention (report to your doctor or health care professional if they continue or are bothersome):  dizziness  dry mouth  nausea, vomiting  tiredness This list may not describe all possible side effects. Call your doctor for medical advice about side effects. You may report side effects to FDA at 1-800-FDA-1088. Where should I keep my medicine? Keep out of the reach of children. This medicine can be abused. Keep your medicine in a safe place to protect it from theft. Do not share this medicine with anyone. Selling or giving away this medicine is dangerous and against the law. Store at room temperature between 20 and 25 degrees C (68 and 77 degrees F). This medicine may cause accidental overdose and death if taken by other adults, children, or pets. Mix any unused medicine with a  substance like cat litter or coffee grounds. Then throw the medicine away in a sealed container like a sealed bag or a coffee can with a lid. Do not use the medicine after the expiration date. NOTE: This sheet is a summary. It may not cover all possible information. If you have questions about this medicine, talk to your doctor, pharmacist, or health care provider.  2020 Elsevier/Gold Standard (2014-12-07 13:47:25)

## 2019-11-16 NOTE — Telephone Encounter (Signed)
Called pt back. Relayed Dr. Garth Bigness message. Pt reports she is wanting her right leg and right arm done for EMG/NCS only.  Says her right arm wasnt giving her issues at last appt but is now for the past few weeks. Advised I will ask Dr. Felecia Shelling and call her back. She said it is ok to LVM if she does not pick up.

## 2019-11-18 ENCOUNTER — Other Ambulatory Visit: Payer: Self-pay | Admitting: Adult Health

## 2019-11-18 DIAGNOSIS — M545 Low back pain: Secondary | ICD-10-CM | POA: Diagnosis not present

## 2019-11-22 ENCOUNTER — Encounter: Payer: BC Managed Care – PPO | Admitting: Neurology

## 2019-11-23 DIAGNOSIS — Z20822 Contact with and (suspected) exposure to covid-19: Secondary | ICD-10-CM | POA: Diagnosis not present

## 2019-12-01 DIAGNOSIS — M545 Low back pain: Secondary | ICD-10-CM | POA: Diagnosis not present

## 2019-12-02 DIAGNOSIS — M9902 Segmental and somatic dysfunction of thoracic region: Secondary | ICD-10-CM | POA: Diagnosis not present

## 2019-12-02 DIAGNOSIS — M9904 Segmental and somatic dysfunction of sacral region: Secondary | ICD-10-CM | POA: Diagnosis not present

## 2019-12-02 DIAGNOSIS — M9901 Segmental and somatic dysfunction of cervical region: Secondary | ICD-10-CM | POA: Diagnosis not present

## 2019-12-02 DIAGNOSIS — M9903 Segmental and somatic dysfunction of lumbar region: Secondary | ICD-10-CM | POA: Diagnosis not present

## 2019-12-15 DIAGNOSIS — M9904 Segmental and somatic dysfunction of sacral region: Secondary | ICD-10-CM | POA: Diagnosis not present

## 2019-12-15 DIAGNOSIS — M9901 Segmental and somatic dysfunction of cervical region: Secondary | ICD-10-CM | POA: Diagnosis not present

## 2019-12-15 DIAGNOSIS — M9902 Segmental and somatic dysfunction of thoracic region: Secondary | ICD-10-CM | POA: Diagnosis not present

## 2019-12-15 DIAGNOSIS — M9903 Segmental and somatic dysfunction of lumbar region: Secondary | ICD-10-CM | POA: Diagnosis not present

## 2019-12-20 ENCOUNTER — Other Ambulatory Visit: Payer: Self-pay | Admitting: Adult Health

## 2019-12-20 DIAGNOSIS — E039 Hypothyroidism, unspecified: Secondary | ICD-10-CM

## 2019-12-22 DIAGNOSIS — M545 Low back pain: Secondary | ICD-10-CM | POA: Diagnosis not present

## 2020-01-03 ENCOUNTER — Other Ambulatory Visit: Payer: Self-pay

## 2020-01-03 ENCOUNTER — Encounter: Payer: BC Managed Care – PPO | Admitting: Neurology

## 2020-01-03 ENCOUNTER — Ambulatory Visit (INDEPENDENT_AMBULATORY_CARE_PROVIDER_SITE_OTHER): Payer: BC Managed Care – PPO | Admitting: Neurology

## 2020-01-03 DIAGNOSIS — M5431 Sciatica, right side: Secondary | ICD-10-CM

## 2020-01-03 DIAGNOSIS — M79601 Pain in right arm: Secondary | ICD-10-CM | POA: Diagnosis not present

## 2020-01-03 DIAGNOSIS — Z0289 Encounter for other administrative examinations: Secondary | ICD-10-CM

## 2020-01-03 DIAGNOSIS — M79604 Pain in right leg: Secondary | ICD-10-CM

## 2020-01-03 MED ORDER — PHENTERMINE HCL 37.5 MG PO CAPS
37.5000 mg | ORAL_CAPSULE | ORAL | 2 refills | Status: DC
Start: 2020-01-03 — End: 2020-04-24

## 2020-01-03 MED ORDER — ETODOLAC 400 MG PO TABS
400.0000 mg | ORAL_TABLET | Freq: Two times a day (BID) | ORAL | 5 refills | Status: DC
Start: 2020-01-03 — End: 2020-04-24

## 2020-01-03 NOTE — Progress Notes (Signed)
Full Name: Charlotte Wise Gender: Female MRN #: [id number] Date of Birth: [birth year]    Visit Date: 01/03/2020 07:23 Age: 44 Years Examining Physician: Arlice Colt, MD  Referring Physician: Arlice Colt, MD Height: 5 feet 1 inch     History: Charlotte Wise is a 44 year old woman with right leg leg and arm pain since March 2021.  Nerve conduction studies: The right median, ulnar, peroneal and tibial motor responses had normal distal latencies, amplitudes and conduction velocities.  The right tibial and ulnar F-wave responses had normal latencies.  The right median, ulnar, sural and superficial peroneal sensory responses had normal peak latencies and amplitudes.  The galvanic sympathetic skin response in the foot was normal.  Electromyography:  Needle EMG of selected muscles of the right arm and leg was performed.  2 of the C7 innervated muscles tested had some polyphasic motor units though recruitment was still normal.  There was no abnormal spontaneous activity in these all other muscles.  Impression: This NCV/EMG study shows the following: 1.   There was no electrophysiologic evidence of a polyneuropathy or mononeuropathy. 2.  Possible mild right chronic C7 radiculopathy.  No evidence of lumbar radiculopathy.  Charlotte Wise A. Felecia Shelling, MD, PhD, FAAN Certified in Neurology, Clinical Neurophysiology, Sleep Medicine, Pain Medicine and Neuroimaging Director, [facility] [address] [place], [place] [address] [phone]    Verbal informed consent was obtained from the patient, patient was informed of potential risk of procedure, including bruising, bleeding, hematoma formation, infection, muscle weakness, muscle pain, numbness, among others.         Wise    Nerve / Sites Muscle Latency Ref. Amplitude Ref. Rel Amp Segments Distance Velocity Ref. Area    ms ms mV mV %  cm m/s m/s mVms  R  Median - APB     Wrist APB 3.4 ?4.4 8.7 ?4.0 100 Wrist - APB 7   29.0     Upper arm APB 6.5  8.4  96.9 Upper arm - Wrist 17 54 ?49 28.7  R Ulnar - ADM     Wrist ADM 2.4 ?3.3 13.0 ?6.0 100 Wrist - ADM 7   36.7     B.Elbow ADM 4.9  12.2  93.7 B.Elbow - Wrist 17 69 ?49 35.8     A.Elbow ADM 6.3  11.8  96.8 A.Elbow - B.Elbow 10 69 ?49 36.0         A.Elbow - Wrist      R Peroneal - EDB     Ankle EDB 4.4 ?6.5 6.8 ?2.0 100 Ankle - EDB 9   24.2     Fib head EDB 9.1  6.7  98.1 Fib head - Ankle 25 53 ?44 23.6     Pop fossa EDB 11.0  6.7  99 Pop fossa - Fib head 10 53 ?44 23.8         Pop fossa - Ankle      R Tibial - AH     Ankle AH 3.8 ?5.8 9.6 ?4.0 100 Ankle - AH 9   22.2     Pop fossa AH 11.1  8.8  91.3 Pop fossa - Ankle 30 41 ?41 22.1             SSR    Nerve / Sites Latency   s  R Sympathetic - Foot     Foot 2.40  [place]    Nerve / Sites Rec. Site Peak Lat Ref.  Amp Ref. Segments Distance Peak Diff Ref.    ms ms V V  cm ms ms  R Sural - Ankle (Calf)     Calf Ankle 3.5 ?4.4 23 ?6 Calf - Ankle 14    R Superficial peroneal - Ankle     Lat leg Ankle 3.4 ?4.4 9 ?6 Lat leg - Ankle 14    R Median, Ulnar - Transcarpal comparison     Median Palm Wrist 2.3 ?2.2 75 ?35 Median Palm - Wrist 8       Ulnar Palm Wrist 2.0 ?2.2 36 ?12 Ulnar Palm - Wrist 8          Median Palm - Ulnar Palm  0.3 ?0.4  R Median - Orthodromic (Dig II, Mid palm)     Dig II Wrist 3.2 ?3.4 14 ?10 Dig II - Wrist 13    R Ulnar - Orthodromic, (Dig V, Mid palm)     Dig V Wrist 2.7 ?3.1 8 ?5 Dig V - Wrist 35                 F  Wave    Nerve F Lat Ref.   ms ms  R Tibial - AH 44.4 ?56.0  R Ulnar - ADM 24.8 ?32.0         EMG Summary Table    Spontaneous MUAP Recruitment  Muscle IA Fib PSW Fasc Other Amp Dur. Poly Pattern  R. Deltoid Normal None None None _______ Normal Normal Normal Normal  R. Triceps brachii Normal None None None _______ Normal Normal 1+ Normal  R. Biceps brachii Normal None None None _______  Normal Normal Normal Normal  R. Extensor digitorum communis Normal None None None _______ Normal Normal 1+ Normal  R. First dorsal interosseous Normal None None None _______ Normal Normal Normal Normal  R. Pronator teres Normal None None None _______ Normal Normal Normal Normal  R. Vastus medialis Normal None None None _______ Normal Normal Normal Normal  R. Iliopsoas Normal None None None _______ Normal Normal Normal Normal  R. Peroneus longus Normal None None None _______ Normal Normal Normal Normal  R. Tibialis anterior Normal None None None _______ Normal Normal Normal Normal  R. Gastrocnemius (Medial head) Normal None None None _______ Normal Normal Normal Normal  R. Abductor hallucis Normal None None None _______ Normal Normal Normal Normal  R. Gluteus medius Normal None None None _______ Normal Normal Normal Normal        Charlotte Wise  PATIENT: Charlotte Wise DOB: [birth year]  REFERRING DOCTOR OR PCP: Unk Pinto SOURCE: Patient, notes from primary care  _________________________________   HISTORICAL  CHIEF COMPLAINT:  Chief Complaint  Patient presents with  . Pain    Right leg greater than right arm    HISTORY OF PRESENT ILLNESS:  Charlotte Wise is a 44 year old woman who has had numbness and pain in the right arm and leg since March 2021.      Update 01/03/2020: NCV/EMG of the rigth arm showed minimal right C7 radiculopathy and right leg was normal.    She continues to report pain.  Pain had been mostly in the right leg with the worse pain in the medial right lower leg and tingling in the outer lower leg and foot.   Since a long car ride, pain has increased.     She is doing therapy and has been given some exercises  to do.     Also has neck pain and pain and discomfort into the arm and C6/C7 hand .   She is on gabapentin 900 mg po tid.  Amitriptyline did nothelp much and caused weight gain.    She took a few phentermine (was on last week) and  felt pain was mildly better.  Marland Kitchen  History of back and leg pain pain: She was moving mulch with a large bucket (multiple trips) 06/02/2019 and she felt very tired later that day.  She was so tired the next 5 days that she was sleeping more.   She started to have right leg >> arm pain about 5 days later.   A few days later (06/11/19) she started to experience total body pain, right > left.  The leg was most involved but she also noted symptoms in her flank.  Arms were involved to a lesser extent.   She also noted numbness in her neck and face.  She also noted spasms in her leg.  These symptoms were constant for a couple weeks.   She did not note weakness.    She noted no change in gait.  Bladder function was unchanged.   After the pain and numbness was constant for a while it changed to become more intermittent a few weeks ago.   However, she had more neck and back pain as the numbness improved.  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  There was foraminal narrowing at these levels.  Could affect right C7 nerve root.  MRI lumba r6/6/21 just showed mild bulging at L5-S1 but no nerve root compression.   Marland Kitchen   REVIEW OF SYSTEMS: Constitutional: No fevers, chills, sweats, or change in appetite.  She has insomnia Eyes: No visual changes, double vision, eye pain Ear, nose and throat: No hearing loss, ear pain, nasal congestion, sore throat Cardiovascular: No chest pain, palpitations Respiratory: No shortness of breath at rest or with exertion.   No wheezes GastrointestinaI: No nausea, vomiting, diarrhea, abdominal pain, fecal incontinence Genitourinary: No dysuria, urinary retention or frequency.  No nocturia. Musculoskeletal: No neck pain, back pain Integumentary: No rash, pruritus, skin lesions Neurological: as above Psychiatric: H/o depression Endocrine: No palpitations, diaphoresis, change in appetite, change in weigh or increased  thirst Hematologic/Lymphatic: No anemia, purpura, petechiae. Allergic/Immunologic: No itchy/runny eyes, nasal congestion, recent allergic reactions, rashes  ALLERGIES: Allergies  Allergen Reactions  . Hydrocodone Itching  . Oxycodone-Acetaminophen   . Penicillins Swelling    HOME MEDICATIONS:  Current Outpatient Medications:  .  acetaminophen (TYLENOL) 500 MG tablet, Take 500 mg by mouth every 6 (six) hours as needed. Takes 0-6 per day depending on her pain level, Disp: , Rfl:  .  bismuth subsalicylate (PEPTO BISMOL) 262 MG/15ML suspension, Take 30 mLs by mouth every 6 (six) hours as needed., Disp: , Rfl:  .  etodolac (LODINE) 400 MG tablet, Take 1 tablet (400 mg total) by mouth 2 (two) times daily., Disp: 60 tablet, Rfl: 5 .  gabapentin (NEURONTIN) 300 MG capsule, Take 3 capsules (900 mg total) by mouth 3 (three) times daily as needed. For pain, Disp: 270 capsule, Rfl: 3 .  imipramine (TOFRANIL) 25 MG tablet, Take 1 tablet (25 mg total) by mouth at bedtime. (Patient not taking: Reported on 11/16/2019), Disp: 30 tablet, Rfl: 3 .  levothyroxine (SYNTHROID) 75 MCG tablet, TAKE 1 TABLET DAILY ON EMPTY STOMACH WITH ONLY WATER FOR 30 MINUTES &  NO ANTACID MEDS, CALCIUM OR MAGNESIUM FOR 4 HOURS AND AVOID BIOTIN, Disp: 90 tablet, Rfl: 3 .  MAGNESIUM PO, Take 400 mg by mouth daily., Disp: , Rfl:  .  Multiple Vitamins-Minerals (MULTIVITAMIN ADULTS PO), Take 1 tablet by mouth daily., Disp: , Rfl:  .  Omega 3-6-9 Fatty Acids (OMEGA-3-6-9 PO), Take 1 Dose by mouth daily., Disp: , Rfl:  .  phentermine 37.5 MG capsule, Take 1 capsule (37.5 mg total) by mouth every morning., Disp: 30 capsule, Rfl: 2 .  sucralfate (CARAFATE) 1 g tablet, Take 1 tablet (1 g total) by mouth 4 (four) times daily. (Patient taking differently: Take 1 g by mouth 4 (four) times daily. Doesn't take consistently), Disp: 120 tablet, Rfl: 1 .  temazepam (RESTORIL) 30 MG capsule, TAKE 1 CAPSULE(30 MG) BY MOUTH AT BEDTIME AS NEEDED FOR  SLEEP, Disp: 30 capsule, Rfl: 1 .  VITAMIN D PO, Take 10,000 Units by mouth., Disp: , Rfl:   PAST MEDICAL HISTORY: Past Medical History:  Diagnosis Date  . Allergy   . Anxiety   . Arthritis   . Hyperlipidemia 05/12/2018  . 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 Sciatica of right side 07/27/2019  . Thyroid disease     PAST SURGICAL HISTORY: Past Surgical History:  Procedure Laterality Date  . ANKLE ARTHROSCOPY Right 2000  . KNEE ARTHROSCOPY Left 1988  . KNEE ARTHROSCOPY Right 1999  . LAPAROSCOPY  2001  . NASAL POLYP SURGERY  2006  . TONSILLECTOMY AND ADENOIDECTOMY  1981    FAMILY HISTORY: Family History  Problem Relation Age of Onset  . Alcohol abuse Father   . Drug abuse Father   . Thyroid cancer Maternal Grandmother        With mets to bone  . Heart disease Paternal Grandfather   . Stroke Paternal Grandfather   . Clotting disorder Mother        lupus antibody   . Alcohol abuse Mother   . Drug abuse Mother   . Kidney disease Maternal Grandfather   . Drug abuse Brother   . Alcohol abuse Brother   . Heart disease Brother       PHYSICAL EXAM  There were no vitals filed for this visit.  There is no height or weight on file to calculate BMI.   General: The patient is well-developed and well-nourished and in no acute distress  HEENT:  Head is [place]/AT.     Musculoskeletal:  Back is fairly nontender with good range of motion.  She has mild paraspinal tenderness.  Neurologic Exam  Mental status: The patient is alert and oriented x 3 at the time of the examination. The patient has apparent normal recent and remote memory, with an apparently normal attention span and concentration ability.   Speech is normal.  Cranial nerves: Extraocular movements are full   facial strength is normal.   No obvious hearing deficits are noted.  Motor:  Muscle bulk is normal.   Tone is normal.  Strength is  5 / 5 in all 4 extremities.   Sensory:   Reported symmetric arm and leg sensation to touch.   Coordination: Good finger-nose-finger and heel-to-shin bilaterally.  Gait and station: Station is normal.   Gait is normal. Tandem gait is mildly wide.        ASSESSMENT AND PLAN  Right arm pain  Sciatica of right side - Plan: NCV with EMG(electromyography)  Pain of right  lower extremity - Plan: NCV with EMG(electromyography)  1.   Gabapentin 900 mg 3 times daily.  She will d/c amitriptyline.   If pain worsens, consider oxcarbazepine. 2.   Continue exercises.   I will call in several months of phentermine to help with weight loss 3.  Etodolac 400 mg po bid.   If arm pain worsens, consider right C7 ESI 4.   Call if there are new or worsening neurologic symptoms. Baelynn Schmuhl A. Felecia Shelling, MD, [facility] 47/99/8721, 58:72 PM Certified in Neurology, Clinical Neurophysiology, Sleep Medicine and Neuroimaging  [facility] [address] [place], [place] [address] [phone]

## 2020-01-09 DIAGNOSIS — M5013 Cervical disc disorder with radiculopathy, cervicothoracic region: Secondary | ICD-10-CM | POA: Diagnosis not present

## 2020-01-09 DIAGNOSIS — M545 Low back pain, unspecified: Secondary | ICD-10-CM | POA: Diagnosis not present

## 2020-01-30 DIAGNOSIS — M5013 Cervical disc disorder with radiculopathy, cervicothoracic region: Secondary | ICD-10-CM | POA: Diagnosis not present

## 2020-01-30 DIAGNOSIS — M545 Low back pain, unspecified: Secondary | ICD-10-CM | POA: Diagnosis not present

## 2020-01-31 DIAGNOSIS — M9902 Segmental and somatic dysfunction of thoracic region: Secondary | ICD-10-CM | POA: Diagnosis not present

## 2020-01-31 DIAGNOSIS — M9903 Segmental and somatic dysfunction of lumbar region: Secondary | ICD-10-CM | POA: Diagnosis not present

## 2020-01-31 DIAGNOSIS — M9901 Segmental and somatic dysfunction of cervical region: Secondary | ICD-10-CM | POA: Diagnosis not present

## 2020-01-31 DIAGNOSIS — M9904 Segmental and somatic dysfunction of sacral region: Secondary | ICD-10-CM | POA: Diagnosis not present

## 2020-02-20 DIAGNOSIS — M5013 Cervical disc disorder with radiculopathy, cervicothoracic region: Secondary | ICD-10-CM | POA: Diagnosis not present

## 2020-02-20 DIAGNOSIS — M545 Low back pain, unspecified: Secondary | ICD-10-CM | POA: Diagnosis not present

## 2020-02-21 DIAGNOSIS — M9904 Segmental and somatic dysfunction of sacral region: Secondary | ICD-10-CM | POA: Diagnosis not present

## 2020-02-21 DIAGNOSIS — M9903 Segmental and somatic dysfunction of lumbar region: Secondary | ICD-10-CM | POA: Diagnosis not present

## 2020-02-21 DIAGNOSIS — M9901 Segmental and somatic dysfunction of cervical region: Secondary | ICD-10-CM | POA: Diagnosis not present

## 2020-02-21 DIAGNOSIS — M9902 Segmental and somatic dysfunction of thoracic region: Secondary | ICD-10-CM | POA: Diagnosis not present

## 2020-02-21 NOTE — Progress Notes (Signed)
Assessment and Plan:  Charlotte Wise was seen today for gi problem and hip pain.  Diagnoses and all orders for this visit:  Abdominal pain, generalized -     CBC with Differential/Platelet -     COMPLETE METABOLIC PANEL WITH GFR -     famotidine (PEPCID) 40 MG tablet; Take 1 tablet (40 mg total) by mouth every evening. -     dicyclomine (BENTYL) 10 MG capsule; Take 1 capsule (10 mg total) by mouth 4 (four) times daily for 14 days. Continue psyllium tablets, two tablets with each meal. IBS?  Vague generalized symptoms with unclear descriptors. Consider imaging if no improvement  Muscle spasm of right lower extremity -     methocarbamol (ROBAXIN-750) 750 MG tablet; Take 1 tablet (750 mg total) by mouth every 8 (eight) hours as needed for muscle spasms. Continue PT and recommendations Increase physical activity slowly       Further disposition pending results of labs. Discussed med's effects and SE's.   Over 30 minutes of face to face, exam, counseling, chart review, and critical decision making was performed.   Future Appointments  Date Time Provider [place]  05/18/2020  9:30 AM Liane Comber, NP [facility] None    ------------------------------------------------------------------------------------------------------------------   HPI 44 y.o.female presents for abdominal pain that started two years.  She reports that it is intermittent. She reports she was taking essential oils,m oregano and thyme.  She started having bad burning in her abdominal area.  She just dealt with it.  Then spicy foods started to effect her.  She did not take anything for this.  She did take sulcrafate at that time and felt it did not help  She does not eat spicy foods at this time. She repots in the last three weeks she feels like something in her mouth, like blood or acid.  She reports it happens after she has eaten some of her meal.  Even if her stomach it empty and she drinks water then she has  this taste in her mouth. She describes it as something has vaporized in her mouth.  She reports she will go weeks without her body asking for food.  She reports that it is a feeling of her abdomin sucking in.  At times she does not have bowel movements for weeks at a time.  Her last was two days ago and she was type 4.  She is taking fiber pills in the past week.  She reports after eating she is having some bloating.  She reports that it is eat to upset her stomach.  She has been taking psyllium pills when she eats, two with each meal.    Past Medical History:  Diagnosis Date  . Allergy   . Anxiety   . Arthritis   . Hyperlipidemia 05/12/2018  . 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 Sciatica of right side 07/27/2019  . Thyroid disease      Allergies  Allergen Reactions  . Hydrocodone Itching  . Oxycodone-Acetaminophen   . Penicillins Swelling    Current Outpatient Medications on File Prior to Visit  Medication Sig  . acetaminophen (TYLENOL) 500 MG tablet Take 500 mg by mouth every 6 (six) hours as needed. Takes 0-6 per day depending on her pain level  . bismuth subsalicylate (PEPTO BISMOL) 262 MG/15ML suspension Take 30 mLs by mouth every 6 (six) hours as needed.  . etodolac (LODINE) 400 MG tablet Take  1 tablet (400 mg total) by mouth 2 (two) times daily.  Marland Kitchen gabapentin (NEURONTIN) 300 MG capsule Take 3 capsules (900 mg total) by mouth 3 (three) times daily as needed. For pain  . imipramine (TOFRANIL) 25 MG tablet Take 1 tablet (25 mg total) by mouth at bedtime. (Patient not taking: Reported on 11/16/2019)  . levothyroxine (SYNTHROID) 75 MCG tablet TAKE 1 TABLET DAILY ON EMPTY STOMACH WITH ONLY WATER FOR 30 MINUTES & NO ANTACID MEDS, CALCIUM OR MAGNESIUM FOR 4 HOURS AND AVOID BIOTIN  . MAGNESIUM PO Take 400 mg by mouth daily.  . Multiple Vitamins-Minerals (MULTIVITAMIN ADULTS PO) Take 1 tablet by  mouth daily.  [name] 3-6-9 Fatty Acids (OMEGA-3-6-9 PO) Take 1 Dose by mouth daily.  . phentermine 37.5 MG capsule Take 1 capsule (37.5 mg total) by mouth every morning.  . sucralfate (CARAFATE) 1 g tablet Take 1 tablet (1 g total) by mouth 4 (four) times daily. (Patient taking differently: Take 1 g by mouth 4 (four) times daily. Doesn't take consistently)  . temazepam (RESTORIL) 30 MG capsule TAKE 1 CAPSULE(30 MG) BY MOUTH AT BEDTIME AS NEEDED FOR SLEEP  . VITAMIN D PO Take 10,000 Units by mouth.   No current facility-administered medications on file prior to visit.    ROS: all negative except above.   Physical Exam:  BP 118/62   Pulse 86   Temp 98.1 F (36.7 C)   Wt 147 lb 3.2 oz (66.8 kg)   SpO2 98%   BMI 27.36 kg/m   General Appearance: Well nourished, in no apparent distress. Eyes: PERRLA, EOMs, conjunctiva no swelling or erythema Sinuses: No Frontal/maxillary tenderness ENT/Mouth: Ext aud canals clear, TMs without erythema, bulging. No erythema, swelling, or exudate on post pharynx.  Tonsils not swollen or erythematous. Hearing normal.  Neck: Supple, thyroid normal.  Respiratory: Respiratory effort normal, BS equal bilaterally without rales, rhonchi, wheezing or stridor.  Cardio: RRR with no MRGs. Brisk peripheral pulses without edema.  Abdomen: Soft, + BS, hyperactive.  Non tender, no guarding, rebound, hernias, masses. Lymphatics: Non tender without lymphadenopathy.  Musculoskeletal: Full ROM, 5/5 strength, normal gait.  Skin: Warm, dry without rashes, lesions, ecchymosis.  Neuro: Cranial nerves intact. Normal muscle tone, no cerebellar symptoms. Sensation intact.  Psych: Awake and oriented X 3, normal affect, Insight and Judgment appropriate.    Garnet Sierras, Laqueta Jean, DNP [facility] 02/22/2020  10:13 AM

## 2020-02-22 ENCOUNTER — Other Ambulatory Visit: Payer: Self-pay

## 2020-02-22 ENCOUNTER — Ambulatory Visit (INDEPENDENT_AMBULATORY_CARE_PROVIDER_SITE_OTHER): Payer: BC Managed Care – PPO | Admitting: Adult Health Nurse Practitioner

## 2020-02-22 ENCOUNTER — Encounter: Payer: Self-pay | Admitting: Adult Health Nurse Practitioner

## 2020-02-22 VITALS — BP 118/62 | HR 86 | Temp 98.1°F | Wt 147.2 lb

## 2020-02-22 DIAGNOSIS — M62838 Other muscle spasm: Secondary | ICD-10-CM | POA: Diagnosis not present

## 2020-02-22 DIAGNOSIS — R1084 Generalized abdominal pain: Secondary | ICD-10-CM

## 2020-02-22 MED ORDER — METHOCARBAMOL 750 MG PO TABS: 750.0000 mg | ORAL_TABLET | Freq: Three times a day (TID) | ORAL | 1 refills | Status: DC | PRN

## 2020-02-22 MED ORDER — FAMOTIDINE 40 MG PO TABS: 40.0000 mg | ORAL_TABLET | Freq: Every evening | ORAL | 1 refills | Status: DC

## 2020-02-22 MED ORDER — DICYCLOMINE HCL 10 MG PO CAPS: 10.0000 mg | ORAL_CAPSULE | Freq: Four times a day (QID) | ORAL | 0 refills | Status: DC

## 2020-02-22 NOTE — Patient Instructions (Addendum)
   Take the Psyllium tablets two tablets with each meal.   Start taking famotidine (pepsid) once daily in the morning.  Do both of the above interventions for one week before starting the next.     Dicyclomine (Bentyl) one tablet with every meal.  This will help with the bloating.   Make yourself drink water 64-80 ounces of water a day.  Contact office with an y new or worsening symptoms.   Gradually increase your physical activity.   We sent in Robaxin [facility]) 750mg  tablets to help with muscle spasms.  Can take one tablet every 8 hours as needed.  Take prior to any physical therapy, relaxes muscles but does not decrease strength.     GENERAL HEALTH GOALS  Know what a healthy weight is for you (roughly BMI <25) and aim to maintain this  Aim for 7+ servings of fruits and vegetables daily  70-80+ fluid ounces of water or unsweet tea for healthy kidneys  Limit to max 1 drink of alcohol per day; avoid smoking/tobacco  Limit animal fats in diet for cholesterol and heart health - choose grass fed whenever available  Avoid highly processed foods, and foods high in saturated/trans fats  Aim for low stress - take time to unwind and care for your mental health  Aim for 150 min of moderate intensity exercise weekly for heart health, and weights twice weekly for bone health  Aim for 7-9 hours of sleep daily

## 2020-02-23 LAB — COMPLETE METABOLIC PANEL WITH GFR
AG Ratio: 1.9 (calc) (ref 1.0–2.5)
ALT: 10 U/L (ref 6–29)
AST: 15 U/L (ref 10–35)
Albumin: 4.3 g/dL (ref 3.6–5.1)
Alkaline phosphatase (APISO): 42 U/L (ref 31–125)
BUN: 12 mg/dL (ref 7–25)
CO2: 29 mmol/L (ref 20–32)
Calcium: 9.4 mg/dL (ref 8.6–10.2)
Chloride: 105 mmol/L (ref 98–110)
Creat: 0.8 mg/dL (ref 0.50–1.10)
GFR, Est African American: 103 mL/min/{1.73_m2} (ref >=60)
GFR, Est Non African American: 89 mL/min/{1.73_m2} (ref >=60)
Globulin: 2.3 g/dL (calc) (ref 1.9–3.7)
Glucose, Bld: 82 mg/dL (ref 65–99)
Potassium: 4.9 mmol/L (ref 3.5–5.3)
Sodium: 140 mmol/L (ref 135–146)
Total Bilirubin: 1.2 mg/dL (ref 0.2–1.2)
Total Protein: 6.6 g/dL (ref 6.1–8.1)

## 2020-02-23 LAB — CBC WITH DIFFERENTIAL/PLATELET
Absolute Monocytes: 428 cells/uL (ref 200–950)
Basophils Absolute: 38 cells/uL (ref 0–200)
Basophils Relative: 0.8 %
Eosinophils Absolute: 230 cells/uL (ref 15–500)
Eosinophils Relative: 4.9 %
HCT: 38.7 % (ref 35.0–45.0)
Hemoglobin: 13.3 g/dL (ref 11.7–15.5)
Lymphs Abs: 1795 cells/uL (ref 850–3900)
MCH: 33.2 pg — ABNORMAL HIGH (ref 27.0–33.0)
MCHC: 34.4 g/dL (ref 32.0–36.0)
MCV: 96.5 fL (ref 80.0–100.0)
MPV: 9.5 fL (ref 7.5–12.5)
Monocytes Relative: 9.1 %
Neutro Abs: 2209 cells/uL (ref 1500–7800)
Neutrophils Relative %: 47 %
Platelets: 336 10*3/uL (ref 140–400)
RBC: 4.01 10*6/uL (ref 3.80–5.10)
RDW: 12.4 % (ref 11.0–15.0)
Total Lymphocyte: 38.2 %
WBC: 4.7 10*3/uL (ref 3.8–10.8)

## 2020-02-24 ENCOUNTER — Other Ambulatory Visit: Payer: Self-pay | Admitting: Neurology

## 2020-03-01 DIAGNOSIS — H35363 Drusen (degenerative) of macula, bilateral: Secondary | ICD-10-CM | POA: Diagnosis not present

## 2020-03-01 DIAGNOSIS — H43393 Other vitreous opacities, bilateral: Secondary | ICD-10-CM | POA: Diagnosis not present

## 2020-03-01 DIAGNOSIS — D3131 Benign neoplasm of right choroid: Secondary | ICD-10-CM | POA: Diagnosis not present

## 2020-03-01 DIAGNOSIS — Q120 Congenital cataract: Secondary | ICD-10-CM | POA: Diagnosis not present

## 2020-03-07 ENCOUNTER — Other Ambulatory Visit: Payer: Self-pay | Admitting: Adult Health Nurse Practitioner

## 2020-03-07 DIAGNOSIS — M5431 Sciatica, right side: Secondary | ICD-10-CM

## 2020-03-07 DIAGNOSIS — M62838 Other muscle spasm: Secondary | ICD-10-CM

## 2020-03-07 MED ORDER — DEXAMETHASONE 4 MG PO TABS: ORAL_TABLET | ORAL | 1 refills | Status: DC

## 2020-03-07 MED ORDER — CYCLOBENZAPRINE HCL 5 MG PO TABS: 5.0000 mg | ORAL_TABLET | Freq: Three times a day (TID) | ORAL | 0 refills | Status: DC | PRN

## 2020-03-09 DIAGNOSIS — M9901 Segmental and somatic dysfunction of cervical region: Secondary | ICD-10-CM | POA: Diagnosis not present

## 2020-03-09 DIAGNOSIS — M9902 Segmental and somatic dysfunction of thoracic region: Secondary | ICD-10-CM | POA: Diagnosis not present

## 2020-03-09 DIAGNOSIS — M9903 Segmental and somatic dysfunction of lumbar region: Secondary | ICD-10-CM | POA: Diagnosis not present

## 2020-03-09 DIAGNOSIS — M9904 Segmental and somatic dysfunction of sacral region: Secondary | ICD-10-CM | POA: Diagnosis not present

## 2020-04-02 ENCOUNTER — Other Ambulatory Visit: Payer: Self-pay | Admitting: Adult Health Nurse Practitioner

## 2020-04-03 ENCOUNTER — Other Ambulatory Visit: Payer: Self-pay | Admitting: Neurology

## 2020-04-03 DIAGNOSIS — M5013 Cervical disc disorder with radiculopathy, cervicothoracic region: Secondary | ICD-10-CM | POA: Diagnosis not present

## 2020-04-03 DIAGNOSIS — M545 Low back pain, unspecified: Secondary | ICD-10-CM | POA: Diagnosis not present

## 2020-04-03 MED ORDER — DULOXETINE HCL 60 MG PO CPEP
60.0000 mg | ORAL_CAPSULE | Freq: Every day | ORAL | 5 refills | Status: DC
Start: 2020-04-03 — End: 2020-05-28

## 2020-04-04 ENCOUNTER — Ambulatory Visit: Payer: BC Managed Care – PPO | Admitting: Adult Health

## 2020-04-04 ENCOUNTER — Other Ambulatory Visit: Payer: Self-pay

## 2020-04-04 ENCOUNTER — Encounter: Payer: Self-pay | Admitting: Adult Health

## 2020-04-04 VITALS — HR 88 | Temp 97.5°F | Wt 135.0 lb

## 2020-04-04 DIAGNOSIS — R1013 Epigastric pain: Secondary | ICD-10-CM

## 2020-04-04 DIAGNOSIS — M62838 Other muscle spasm: Secondary | ICD-10-CM | POA: Diagnosis not present

## 2020-04-04 DIAGNOSIS — Z79899 Other long term (current) drug therapy: Secondary | ICD-10-CM

## 2020-04-04 DIAGNOSIS — E038 Other specified hypothyroidism: Secondary | ICD-10-CM | POA: Diagnosis not present

## 2020-04-04 DIAGNOSIS — E063 Autoimmune thyroiditis: Secondary | ICD-10-CM

## 2020-04-04 MED ORDER — OMEPRAZOLE 20 MG PO CPDR: 20.0000 mg | DELAYED_RELEASE_CAPSULE | Freq: Two times a day (BID) | ORAL | 2 refills | Status: DC

## 2020-04-04 NOTE — Patient Instructions (Signed)
I think you may have severe gastritis or an ulcer   Will rule out H. Pylori, then start on omeprazole twice daily before meals - please take thyroid med at least 1 hour prior to omeprazole  Please avoid all NSAIDs (ibuprofen, aleve, aspirin) and alcohol, caffeine, spicy foods, etc     Peptic Ulcer  A peptic ulcer is a sore in the lining of the stomach (gastric ulcer) or the first part of the small intestine (duodenal ulcer). The ulcer causes a gradual wearing away (erosion) of the deeper tissue. What are the causes? Normally, the lining of the stomach and the small intestine protects them from the acid that digests food. The protective lining can be damaged by:  An infection caused by a type of bacteria called Helicobacter pylori or H. pylori.  Regular use of NSAIDs, such as ibuprofen or aspirin.  Rare tumors in the stomach, small intestine, or pancreas (Zollinger-Ellison syndrome). What increases the risk? The following factors may make you more likely to develop this condition:  Smoking.  Having a family history of ulcer disease.  Drinking alcohol.  Having been hospitalized in an intensive care unit (ICU). What are the signs or symptoms? Symptoms of this condition include:  Persistent burning pain in the area between the chest and the belly button. The pain may be worse on an empty stomach and at night.  Heartburn.  Nausea and vomiting.  Bloating. If the ulcer results in bleeding, it can cause:  Black, tarry stools.  Vomiting of bright red blood.  Vomiting of material that looks like coffee grounds. How is this diagnosed? This condition may be diagnosed based on:  Your medical history and a physical exam.  Various tests or procedures, such as: ? Blood tests, stool tests, or breath tests to check for the H. pylori bacteria. ? An X-ray exam (upper gastrointestinal series) of the esophagus, stomach, and small intestine. ? Upper endoscopy. The health care  provider examines the esophagus, stomach, and small intestine using a small flexible tube that has a video camera at the end. ? Biopsy. A tissue sample is removed to be examined under a microscope. How is this treated? Treatment for this condition may include:  Eliminating the cause of the ulcer, such as smoking or use of NSAIDs, and limiting alcohol and caffeine intake.  Medicines to reduce the amount of acid in your digestive tract.  Antibiotic medicines, if the ulcer is caused by an H. pylori infection.  An upper endoscopy may be used to treat a bleeding ulcer.  Surgery. This may be needed if the bleeding is severe or if the ulcer created a hole somewhere in the digestive system. Follow these instructions at home:  Do not drink alcohol if your health care provider tells you not to drink.  Do not use any products that contain nicotine or tobacco, such as cigarettes, e-cigarettes, and chewing tobacco. If you need help quitting, ask your health care provider.  Take over-the-counter and prescription medicines only as told by your health care provider. ? Do not use over-the-counter medicines in place of prescription medicines unless your health care provider approves. ? Do not take aspirin, ibuprofen, or other NSAIDs unless your health care provider told you to do so.  Take over-the-counter and prescription medicines only as told by your health care provider.  Keep all follow-up visits as told by your health care provider. This is important. Contact a health care provider if:  Your symptoms do not improve within 7  days of starting treatment.  You have ongoing indigestion or heartburn. Get help right away if:  You have sudden, sharp, or persistent pain in your abdomen.  You have bloody or dark black, tarry stools.  You vomit blood or material that looks like coffee grounds.  You become light-headed or you feel faint.  You become weak.  You become sweaty or  clammy. Summary  A peptic ulcer is a sore in the lining of the stomach (gastric ulcer) or the first part of the small intestine (duodenal ulcer). The ulcer causes a gradual wearing away (erosion) of the deeper tissue.  Do not use any products that contain nicotine or tobacco, such as cigarettes, e-cigarettes, and chewing tobacco. If you need help quitting, ask your health care provider.  Take over-the-counter and prescription medicines only as told by your health care provider. Do not use over-the-counter medicines in place of prescription medicines unless your health care provider approves.  Contact your health care provider if you have ongoing indigestion or heartburn.  Keep all follow-up visits as told by your health care provider. This is important. This information is not intended to replace advice given to you by your health care provider. Make sure you discuss any questions you have with your health care provider. Document Revised: 09/15/2017 Document Reviewed: 09/15/2017 Elsevier Patient Education  2021 [employer] for Gastroesophageal Reflux Disease, Adult When you have gastroesophageal reflux disease (GERD), the foods you eat and your eating habits are very important. Choosing the right foods can help ease your discomfort. Think about working with a food expert (dietitian) to help you make good choices. What are tips for following this plan? Reading food labels  Look for foods that are low in saturated fat. Foods that may help with your symptoms include: ? Foods that have less than 5% of daily value (DV) of fat. ? Foods that have 0 grams of trans fat. Cooking  Do not fry your food.  Cook your food by baking, steaming, grilling, or broiling. These are all methods that do not need a lot of fat for cooking.  To add flavor, try to use herbs that are low in spice and acidity. Meal planning  Choose healthy foods that are low in fat, such as: ? Fruits  and vegetables. ? Whole grains. ? Low-fat dairy products. ? Lean meats, fish, and poultry.  Eat small meals often instead of eating 3 large meals each day. Eat your meals slowly in a place where you are relaxed. Avoid bending over or lying down until 2-3 hours after eating.  Limit high-fat foods such as fatty meats or fried foods.  Limit your intake of fatty foods, such as oils, butter, and shortening.  Avoid the following as told by your doctor: ? Foods that cause symptoms. These may be different for different people. Keep a food diary to keep track of foods that cause symptoms. ? Alcohol. ? Drinking a lot of liquid with meals. ? Eating meals during the 2-3 hours before bed.   Lifestyle  Stay at a healthy weight. Ask your doctor what weight is healthy for you. If you need to lose weight, work with your doctor to do so safely.  Exercise for at least 30 minutes on 5 or more days each week, or as told by your doctor.  Wear loose-fitting clothes.  Do not smoke or use any products that contain nicotine or tobacco. If you need help quitting, ask your  doctor.  Sleep with the head of your bed higher than your feet. Use a wedge under the mattress or blocks under the bed frame to raise the head of the bed.  Chew sugar-free gum after meals. What foods should eat? Eat a healthy, well-balanced diet of fruits, vegetables, whole grains, low-fat dairy products, lean meats, fish, and poultry. Each person is different. Foods that may cause symptoms in one person may not cause any symptoms in another person. Work with your doctor to find foods that are safe for you. The items listed above may not be a complete list of what you can eat and drink. Contact a food expert for more options.   What foods should I avoid? Limiting some of these foods may help in managing the symptoms of GERD. Everyone is different. Talk with a food expert or your doctor to help you find the exact foods to avoid, if  any. Fruits Any fruits prepared with added fat. Any fruits that cause symptoms. For some people, this may include citrus fruits, such as oranges, grapefruit, pineapple, and lemons. Vegetables Deep-fried vegetables. [place] fries. Any vegetables prepared with added fat. Any vegetables that cause symptoms. For some people, this may include tomatoes and tomato products, chili peppers, onions and garlic, and horseradish. Grains Pastries or quick breads with added fat. Meats and other proteins High-fat meats, such as fatty beef or pork, hot dogs, ribs, ham, sausage, salami, and bacon. Fried meat or protein, including fried fish and fried chicken. Nuts and nut butters, in large amounts. Dairy Whole milk and chocolate milk. Sour cream. Cream. Ice cream. Cream cheese. Milkshakes. Fats and oils Butter. Margarine. Shortening. Ghee. Beverages Coffee and tea, with or without caffeine. Carbonated beverages. Sodas. Energy drinks. Fruit juice made with acidic fruits, such as orange or grapefruit. Tomato juice. Alcoholic drinks. Sweets and desserts Chocolate and cocoa. Donuts. Seasonings and condiments Pepper. Peppermint and spearmint. Added salt. Any condiments, herbs, or seasonings that cause symptoms. For some people, this may include curry, hot sauce, or vinegar-based salad dressings. The items listed above may not be a complete list of what you should not eat and drink. Contact a food expert for more options. Questions to ask your doctor Diet and lifestyle changes are often the first steps that are taken to manage symptoms of GERD. If diet and lifestyle changes do not help, talk with your doctor about taking medicines. Where to find more information  International Foundation for Gastrointestinal Disorders: aboutgerd.org Summary  When you have GERD, food and lifestyle choices are very important in easing your symptoms.  Eat small meals often instead of 3 large meals a day. Eat your meals slowly and  in a place where you are relaxed.  Avoid bending over or lying down until 2-3 hours after eating.  Limit high-fat foods such as fatty meats or fried foods. This information is not intended to replace advice given to you by your health care provider. Make sure you discuss any questions you have with your health care provider. Document Revised: 09/19/2019 Document Reviewed: 09/19/2019 Elsevier Patient Education  [place].

## 2020-04-04 NOTE — Progress Notes (Signed)
Assessment and Plan:  Charlotte Wise was seen today for gi problem and spasms.  Diagnoses and all orders for this visit:  Epigastric abdominal pain Suspect gastric etiology, consider h. Pylori or NSAID gastritis/ulcer,  STOP NSAIDs - alternatives tylenol if LFT normal, topicals Avoid ETOH, caffeine, spicy foots - lifestyle for ulcer reviewed If H. Pylori neg will follow up status holding NSAID and with omeprazole in 2 weeks If not improving, will plan to refer to GI - she is also due for colonoscopy this year -     CBC with Differential/Platelet -     COMPLETE METABOLIC PANEL WITH GFR -     H. pylori breath test -     omeprazole (PRILOSEC) 20 MG capsule; Take 1 capsule (20 mg total) by mouth 2 (two) times daily before a meal.  Muscle spasticity Robaxin is helping; r/o dehydration and electrolyte abnormalities, will focus on GI so she can eat/drink more regularly, if persistent follow up with Dr. Felecia Shelling who has been working up numerous neuro/muscular complaints -     COMPLETE METABOLIC PANEL WITH GFR -     Magnesium -     Urinalysis, Routine w reflex microscopic  Hypothyroidism due to Hashimoto's thyroiditis -     TSH  Further disposition pending results of labs. Discussed med's effects and SE's.   Over 30 minutes of exam, counseling, chart review, and critical decision making was performed.   Future Appointments  Date Time Provider [place]  05/18/2020  9:30 AM Liane Comber, NP [facility] None    ------------------------------------------------------------------------------------------------------------------   HPI Pulse 88   Temp (!) 97.5 F (36.4 C)   Wt 135 lb (61.2 kg)   SpO2 97%   BMI 25.09 kg/m   45 y.o.female presents for evaluation of GI sx and muscle spasms.   She reports intermittent epigastric burning coming in cycles for 2 years, with odd taste in mouth in the last 6 weeks, notes more when she has over eaten, will have a flare and have pain for several  weeks, will have severely reduced appetite and even water will make it worse, only thing that helps is not eating. Has Tried Carafate, OTC Nexium daily x 2 weeks, famotidine BID questionably helped but not sure. Has had nausea. Denies diarrhea. Has been avoiding spicy food, alcohol (has noted this will trigger). Due to not eating being only thing that helps, has been going several days without eating and with minimal water intake. Having fewer BMs, denies straining, blood in stool or black stools.   She is notably taking ibuprofen 600 mg regularly, reports "overdid" it working around her house, has had "severe" bil arm pain, also has noted total body msucle spasms/cramping intermittently.   Recently established with Dr. Felecia Shelling due to neck and back pain with paresthesias for several months, recent non-diagnostic MRI of cervical and lumbar spine were felt normal for age and non-diagnostic. 2021 NCV/EMG showed possible mild R chronic C7 radiculopathy, no evidence of lumbar radiculopathy. She was working with PT but stoppped.   She is on gabapentin 900 mg po tid.  Amitriptyline did nothelp much and caused weight gain.    She is on thyroid medication. Her medication was not changed last visit.   Lab Results  Component Value Date   TSH 3.19 11/16/2019  .    Past Medical History:  Diagnosis Date  . Allergy   . Anxiety   . Arthritis   . Hyperlipidemia 05/12/2018  . 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 Sciatica of right side 07/27/2019  . Thyroid disease      Allergies  Allergen Reactions  . Hydrocodone Itching  . Oxycodone-Acetaminophen   . Penicillins Swelling    Current Outpatient Medications on File Prior to Visit  Medication Sig  . DULoxetine (CYMBALTA) 60 MG capsule Take 1 capsule (60 mg total) by mouth daily.  Marland Kitchen gabapentin (NEURONTIN) 300 MG capsule TAKE 3 CAPSULES(900 MG) BY MOUTH THREE  TIMES DAILY AS NEEDED FOR PAIN  . levothyroxine (SYNTHROID) 75 MCG tablet TAKE 1 TABLET DAILY ON EMPTY STOMACH WITH ONLY WATER FOR 30 MINUTES & NO ANTACID MEDS, CALCIUM OR MAGNESIUM FOR 4 HOURS AND AVOID BIOTIN  . MAGNESIUM PO Take 400 mg by mouth daily.  . Multiple Vitamins-Minerals (MULTIVITAMIN ADULTS PO) Take 1 tablet by mouth daily.  [name] 3-6-9 Fatty Acids (OMEGA-3-6-9 PO) Take 1 Dose by mouth daily.  . phentermine 37.5 MG capsule Take 1 capsule (37.5 mg total) by mouth every morning.  . temazepam (RESTORIL) 30 MG capsule TAKE 1 CAPSULE(30 MG) BY MOUTH AT BEDTIME AS NEEDED FOR SLEEP  . VITAMIN D PO Take 10,000 Units by mouth.  Marland Kitchen acetaminophen (TYLENOL) 500 MG tablet Take 500 mg by mouth every 6 (six) hours as needed. Takes 0-6 per day depending on her pain level  . bismuth subsalicylate (PEPTO BISMOL) 262 MG/15ML suspension Take 30 mLs by mouth every 6 (six) hours as needed.  . dicyclomine (BENTYL) 10 MG capsule Take 1 capsule (10 mg total) by mouth 4 (four) times daily for 14 days.  Marland Kitchen etodolac (LODINE) 400 MG tablet Take 1 tablet (400 mg total) by mouth 2 (two) times daily. (Patient not taking: Reported on 04/04/2020)   No current facility-administered medications on file prior to visit.    ROS: all negative except above.   Physical Exam:  Pulse 88   Temp (!) 97.5 F (36.4 C)   Wt 135 lb (61.2 kg)   SpO2 97%   BMI 25.09 kg/m   General Appearance: Well nourished, in no apparent distress. Eyes: PERRLA, EOMs, conjunctiva no swelling or erythema ENT/Mouth: Ext aud canals clear, TMs without erythema, bulging. No erythema, swelling, or exudate on post pharynx.  Tonsils not swollen or erythematous. Hearing normal.  Neck: Supple, thyroid normal.  Respiratory: Respiratory effort normal, BS equal bilaterally without rales, rhonchi, wheezing or stridor.  Cardio: RRR with no MRGs. Brisk peripheral pulses without edema.  Abdomen: Soft, + BS.  Very tender epigastric, mild guarding,  without rebound, hernias, masses. Lymphatics: Non tender without lymphadenopathy.  Musculoskeletal: Full ROM, 5/5 strength, normal gait.  Skin: Warm, dry without rashes, lesions, ecchymosis.  Neuro: Cranial nerves intact. Normal muscle tone, no cerebellar symptoms. Sensation intact.  Psych: Awake and oriented X 3, normal affect, Insight and Judgment is fair.     Izora Ribas, NP 10:38 AM [facility]

## 2020-04-05 ENCOUNTER — Other Ambulatory Visit: Payer: Self-pay | Admitting: Adult Health

## 2020-04-05 DIAGNOSIS — R809 Proteinuria, unspecified: Secondary | ICD-10-CM

## 2020-04-05 LAB — COMPLETE METABOLIC PANEL WITH GFR
AG Ratio: 2 (calc) (ref 1.0–2.5)
ALT: 13 U/L (ref 6–29)
AST: 20 U/L (ref 10–35)
Albumin: 4.7 g/dL (ref 3.6–5.1)
Alkaline phosphatase (APISO): 38 U/L (ref 31–125)
BUN: 14 mg/dL (ref 7–25)
CO2: 25 mmol/L (ref 20–32)
Calcium: 9.2 mg/dL (ref 8.6–10.2)
Chloride: 101 mmol/L (ref 98–110)
Creat: 0.8 mg/dL (ref 0.50–1.10)
GFR, Est African American: 103 mL/min/{1.73_m2} (ref >=60)
GFR, Est Non African American: 89 mL/min/{1.73_m2} (ref >=60)
Globulin: 2.3 g/dL (calc) (ref 1.9–3.7)
Glucose, Bld: 83 mg/dL (ref 65–99)
Potassium: 4 mmol/L (ref 3.5–5.3)
Sodium: 138 mmol/L (ref 135–146)
Total Bilirubin: 1 mg/dL (ref 0.2–1.2)
Total Protein: 7 g/dL (ref 6.1–8.1)

## 2020-04-05 LAB — CBC WITH DIFFERENTIAL/PLATELET
Absolute Monocytes: 340 cells/uL (ref 200–950)
Basophils Absolute: 30 cells/uL (ref 0–200)
Basophils Relative: 0.7 %
Eosinophils Absolute: 82 cells/uL (ref 15–500)
Eosinophils Relative: 1.9 %
HCT: 38.6 % (ref 35.0–45.0)
Hemoglobin: 13.2 g/dL (ref 11.7–15.5)
Lymphs Abs: 1230 cells/uL (ref 850–3900)
MCH: 32.2 pg (ref 27.0–33.0)
MCHC: 34.2 g/dL (ref 32.0–36.0)
MCV: 94.1 fL (ref 80.0–100.0)
MPV: 9.4 fL (ref 7.5–12.5)
Monocytes Relative: 7.9 %
Neutro Abs: 2619 cells/uL (ref 1500–7800)
Neutrophils Relative %: 60.9 %
Platelets: 292 10*3/uL (ref 140–400)
RBC: 4.1 10*6/uL (ref 3.80–5.10)
RDW: 12.8 % (ref 11.0–15.0)
Total Lymphocyte: 28.6 %
WBC: 4.3 10*3/uL (ref 3.8–10.8)

## 2020-04-05 LAB — URINALYSIS, ROUTINE W REFLEX MICROSCOPIC
Bacteria, UA: NONE SEEN /HPF
Bilirubin Urine: NEGATIVE
Glucose, UA: NEGATIVE
Hgb urine dipstick: NEGATIVE
Hyaline Cast: NONE SEEN /LPF
Nitrite: NEGATIVE
Specific Gravity, Urine: 1.025 (ref 1.001–1.03)
pH: 6.5 (ref 5.0–8.0)

## 2020-04-05 LAB — H. PYLORI BREATH TEST: H. pylori Breath Test: NOT DETECTED

## 2020-04-05 LAB — TSH: TSH: 1.43 mIU/L

## 2020-04-05 LAB — MAGNESIUM: Magnesium: 2.1 mg/dL (ref 1.5–2.5)

## 2020-04-11 DIAGNOSIS — M9902 Segmental and somatic dysfunction of thoracic region: Secondary | ICD-10-CM | POA: Diagnosis not present

## 2020-04-11 DIAGNOSIS — M9904 Segmental and somatic dysfunction of sacral region: Secondary | ICD-10-CM | POA: Diagnosis not present

## 2020-04-11 DIAGNOSIS — M9901 Segmental and somatic dysfunction of cervical region: Secondary | ICD-10-CM | POA: Diagnosis not present

## 2020-04-11 DIAGNOSIS — M9903 Segmental and somatic dysfunction of lumbar region: Secondary | ICD-10-CM | POA: Diagnosis not present

## 2020-04-13 ENCOUNTER — Other Ambulatory Visit: Payer: Self-pay | Admitting: Adult Health

## 2020-04-13 DIAGNOSIS — R1013 Epigastric pain: Secondary | ICD-10-CM

## 2020-04-13 DIAGNOSIS — M545 Low back pain, unspecified: Secondary | ICD-10-CM | POA: Diagnosis not present

## 2020-04-13 DIAGNOSIS — M5013 Cervical disc disorder with radiculopathy, cervicothoracic region: Secondary | ICD-10-CM | POA: Diagnosis not present

## 2020-04-17 ENCOUNTER — Other Ambulatory Visit: Payer: Self-pay | Admitting: Adult Health

## 2020-04-17 DIAGNOSIS — Z1211 Encounter for screening for malignant neoplasm of colon: Secondary | ICD-10-CM

## 2020-04-17 DIAGNOSIS — K219 Gastro-esophageal reflux disease without esophagitis: Secondary | ICD-10-CM

## 2020-04-17 DIAGNOSIS — R1013 Epigastric pain: Secondary | ICD-10-CM

## 2020-04-19 ENCOUNTER — Telehealth: Payer: Self-pay

## 2020-04-19 DIAGNOSIS — R3989 Other symptoms and signs involving the genitourinary system: Secondary | ICD-10-CM | POA: Diagnosis not present

## 2020-04-19 MED ORDER — BUSPIRONE HCL 10 MG PO TABS: 10.0000 mg | ORAL_TABLET | Freq: Three times a day (TID) | ORAL | 0 refills | Status: DC

## 2020-04-19 NOTE — Telephone Encounter (Signed)
Patient states that she is having an unusual feeling in her chest, not sure if it's heartburn or anxiety that is intermittent. Patient is already taking Omeprazole and per Dr. Idell Pickles advice to try Buspar, 10mg , 1/2-1 tablet TID PRN.

## 2020-04-24 ENCOUNTER — Encounter: Payer: Self-pay | Admitting: Nurse Practitioner

## 2020-04-24 ENCOUNTER — Ambulatory Visit: Payer: BC Managed Care – PPO | Admitting: Nurse Practitioner

## 2020-04-24 VITALS — BP 126/82 | HR 85 | Ht 61.0 in | Wt 131.8 lb

## 2020-04-24 DIAGNOSIS — Z1211 Encounter for screening for malignant neoplasm of colon: Secondary | ICD-10-CM | POA: Diagnosis not present

## 2020-04-24 DIAGNOSIS — K5900 Constipation, unspecified: Secondary | ICD-10-CM

## 2020-04-24 DIAGNOSIS — R1010 Upper abdominal pain, unspecified: Secondary | ICD-10-CM | POA: Diagnosis not present

## 2020-04-24 DIAGNOSIS — M5013 Cervical disc disorder with radiculopathy, cervicothoracic region: Secondary | ICD-10-CM | POA: Diagnosis not present

## 2020-04-24 DIAGNOSIS — R1084 Generalized abdominal pain: Secondary | ICD-10-CM | POA: Diagnosis not present

## 2020-04-24 DIAGNOSIS — M545 Low back pain, unspecified: Secondary | ICD-10-CM | POA: Diagnosis not present

## 2020-04-24 NOTE — Progress Notes (Signed)
ASSESSMENT AND PLAN     # 45 yo female with two year history of intermittent, diffuse upper abdominal pain, early satiety and weight loss. Rule out PUD /erosive disease from NSAIDS, supplements,  ingestion of essential oil capsules in 2019. Rule out idiopathic gastroparesis, neoplasm.  CMP and CBC normal --For further evaluation patient will be scheduled for EGD . The risks and benefits of EGD were discussed and the patient agrees to proceed.  --If EGD negative then needs abdominal imaging --Continue BID Omeprazole   # ? GERD with recent heartburn.  --Continue PPI --Further evaluation at time of EGD  # Constipation manages with magnesium supplement.   # Colon cancer screening.  --Patient will be scheduled for a colonoscopy. The risks and benefits of colonoscopy with possible polypectomy / biopsies were discussed and the patient agrees to proceed.    HISTORY OF PRESENT ILLNESS     Primary Gastroenterologist : Gerrit Heck, DO  Chief Complaint : abdominal pain   Charlotte Wise is a 45 y.o. female with PMH / [place] significant for,  but not necessarily limited to: hepatic steatosis, anxiety, headaches, depression, GERD, hyperlipidemia, chronic constipation, hypothyroidism  Patient referred by PCP for screening colonoscopy and upper abdominal pain .  Patient's dentist told her two years ago that her back teeth were eroded from acid. However patient doesn't recall having reflux symptoms until three weeks ago. Her main complaint is that of intermittent, diffuse upper abdominal pain over the last couple of years.  .Around the time upper abdominal pain started in 2019 patient had been ingesting capsules of essential oil. She also has a history of NSAID use.  Episodes of upper abdominal pain were occurring every few months but becoming more frequent now and last up to a couple of weeks. The pain does not radiate through to her back. She has no associated nausea / vomiting. She has found  that eating too much at one time triggers the pain so she eats very small portions. She tries to avoid spicy foods and follow a bland diet.  Also, she thinks vitamins may exacerbate or trigger the pain. She periodically takes a multivitamin, Mg+, Omega 369, Vit D, and zinc.   Saw PCP in December 2021 for evaluation of upper abdominal pain. LFTs, CBC normal.  Started on Pepcid. PCP follow up in mid January 2021,  H.pylori breath test negative, repeat LFTs, CBC normal. She was started on Omeprazole, NSAIDS discontinued.  She has been on either Pepcid or PPI since early December with mild improvement in the upper abdominal pain and heartburn. She also describes early satiety. Review of weight in Epic shows wide fluctuations since July 2020. However weight has been continually declining since early Dec 2021 from 147 pounds to 131 pounds today.   Patient has a history of chronic constipation which she manages with magnesium supplements  Data Reviewed: 04/04/20  CMP normal.  CBC  normal   Previous Endoscopic Evaluations / Pertinent Studies:  none   Past Medical History:  Diagnosis Date  . Allergy   . Anxiety   . Arthritis   . Hyperlipidemia 05/12/2018  . Major depressive disorder, 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[place]) 04/15/2016  . 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 Sciatica of right side 07/27/2019  . Thyroid disease      Past Surgical History:  Procedure Laterality Date  . ANKLE ARTHROSCOPY Right 2000  . KNEE ARTHROSCOPY Left 1988  . KNEE ARTHROSCOPY Right 1999  . LAPAROSCOPY  2001  . [place]  2006  . TONSILLECTOMY AND ADENOIDECTOMY  1981   Family History  Problem Relation Age of Onset  . Alcohol abuse Father   . Drug abuse Father   . Thyroid cancer Maternal Grandmother        With mets to bone  . Heart disease Paternal Grandfather   . Stroke Paternal Grandfather   . Clotting disorder Mother        lupus antibody   . Alcohol abuse Mother   . Drug  abuse Mother   . Other Mother        "esophageal erosion"  . Kidney disease Maternal Grandfather   . Drug abuse Brother   . Alcohol abuse Brother   . Heart disease Brother   . Colon cancer Neg Hx   . Esophageal cancer Neg Hx   . Pancreatic cancer Neg Hx   . Liver disease Neg Hx   . Stomach cancer Neg Hx    Social History   Tobacco Use  . Smoking status: Never Smoker  . Smokeless tobacco: Never Used  Vaping Use  . Vaping Use: Never used  Substance Use Topics  . Alcohol use: Yes    Alcohol/week: 0.0 standard drinks    Comment: socially  . Drug use: No   Current Outpatient Medications  Medication Sig Dispense Refill  . acetaminophen (TYLENOL) 500 MG tablet Take 500 mg by mouth every 6 (six) hours as needed. Takes 0-6 per day depending on her pain level    . aluminum-magnesium hydroxide-simethicone (MAALOX) [id number] MG/5ML SUSP Take 20 mLs by mouth 3 (three) times daily.    . Aspirin Effervescent (ALKA-SELTZER PO) Take 1 tablet by mouth as needed.    . busPIRone (BUSPAR) 10 MG tablet Take 1 tablet (10 mg total) by mouth 3 (three) times daily. 90 tablet 0  . Ca Carbonate-Mag Hydroxide (ROLAIDS PO) Take 1 each by mouth as needed.    . DULoxetine (CYMBALTA) 60 MG capsule Take 1 capsule (60 mg total) by mouth daily. 30 capsule 5  . gabapentin (NEURONTIN) 300 MG capsule Take 300 mg by mouth as needed.    Marland Kitchen levothyroxine (SYNTHROID) 75 MCG tablet TAKE 1 TABLET DAILY ON EMPTY STOMACH WITH ONLY WATER FOR 30 MINUTES & NO ANTACID MEDS, CALCIUM OR MAGNESIUM FOR 4 HOURS AND AVOID BIOTIN 90 tablet 3  . MAGNESIUM PO Take 400 mg by mouth daily.    . Multiple Vitamins-Minerals (MULTIVITAMIN ADULTS PO) Take 1 tablet by mouth daily.    Marland Kitchen omeprazole (PRILOSEC) 20 MG capsule Take 1 capsule (20 mg total) by mouth 2 (two) times daily before a meal. 60 capsule 2  . temazepam (RESTORIL) 30 MG capsule Take 30 mg by mouth as needed for sleep.    Marland Kitchen VITAMIN D PO Take 10,000 Units by mouth daily.    [name] 3-6-9 Fatty Acids (OMEGA-3-6-9 PO) Take 1 Dose by mouth daily. (Patient not taking: Reported on 04/24/2020)     No current facility-administered medications for this visit.   Allergies  Allergen Reactions  . Hydrocodone Itching  . Oxycodone-Acetaminophen   . Penicillins Swelling     Review of Systems: Positive for anxiety, back pain, headaches.  All other systems reviewed and negative except where noted in HPI.   PHYSICAL EXAM :    Wt Readings from Last 3 Encounters:  04/24/20 131 lb 12.8 oz (59.8 kg)  04/04/20 135 lb (61.2 kg)  02/22/20 147 lb 3.2 oz (66.8 kg)  BP 126/82   Pulse 85   Ht 5\' 1"  (1.549 m)   Wt 131 lb 12.8 oz (59.8 kg)   SpO2 98%   BMI 24.90 kg/m  Constitutional:  Pleasant female in no acute distress. Psychiatric: Normal mood and affect. Behavior is normal. EENT: Pupils normal.  Conjunctivae are normal. No scleral icterus. Neck supple.  Cardiovascular: Normal rate, regular rhythm. No edema Pulmonary/chest: Effort normal and breath sounds normal. No wheezing, rales or rhonchi. Abdominal: Soft, nondistended, nontender. Bowel sounds active throughout. There are no masses palpable. No hepatomegaly. Neurological: Alert and oriented to person place and time. Skin: Skin is warm and dry. No rashes noted.  Charlotte Savoy, NP  04/24/2020, 2:48 PM  Cc:  Referring Provider Liane Comber, NP

## 2020-04-24 NOTE — Patient Instructions (Addendum)
If you are age 46 or older, your body mass index should be between 23-30. Your Body mass index is 24.9 kg/m. If this is out of the aforementioned range listed, please consider follow up with your Primary Care Provider.  If you are age 9 or younger, your body mass index should be between 19-25. Your Body mass index is 24.9 kg/m. If this is out of the aformentioned range listed, please consider follow up with your Primary Care Provider.   You have been scheduled for an endoscopy and colonoscopy. Please follow the written instructions given to you at your visit today. Sample of Plenvu given to you today.  If you use inhalers (even only as needed), please bring them with you on the day of your procedure.  Continue Omeprazole Twice daily.   Thank you for choosing me and [facility].  Tye Savoy NP

## 2020-04-25 NOTE — Progress Notes (Signed)
Agree with the assessment and plan as outlined by Paula Guenther, NP. ° °Jalyric Kaestner, DO, FACG ° °

## 2020-04-27 ENCOUNTER — Telehealth: Payer: Self-pay

## 2020-04-27 ENCOUNTER — Other Ambulatory Visit: Payer: Self-pay

## 2020-04-27 ENCOUNTER — Telehealth: Payer: Self-pay | Admitting: Nurse Practitioner

## 2020-04-27 NOTE — Telephone Encounter (Signed)
Pt is scheduled to see Dr. Bryan Lemma on 05/01/20 at 0800 for a endocolon. I contacted pt about her covid status as there was no documentation from her office visit. Pt stated that she is not vaccinated and was not told that she needed a covid test done prior to procedure at [facility]. It was not in her prep instructions. Explained to pt that she needed to have a covid test done prior to her procedure, and gave pt the location for the testing site. She verbalized understanding.   She is going Monday 2/7 at 0800 for her covid test. I just wanted to let you know that her results may or may not be back by her procedure time due to the lab being behind.

## 2020-04-27 NOTE — Telephone Encounter (Signed)
Calls to let us know she is taking Valerian supplement prn anxiety. It has B2 and lemon balm leave extract in it as well. She is inquiring on any contraindications on this with having her procedure. Advised her to my knowledge, no contraindications from a GI standpoint or from a colonoscopy prep standpoint. She should discuss any interaction with her other medications with her PCP.

## 2020-04-27 NOTE — Telephone Encounter (Signed)
Thank you for the update. Will look for results when available.

## 2020-04-27 NOTE — Telephone Encounter (Signed)
Inbound call from patient requesting a call back from the nurse please.  Has questions about the instructions for her upcoming procedure.

## 2020-04-30 ENCOUNTER — Encounter: Payer: Self-pay | Admitting: Gastroenterology

## 2020-04-30 ENCOUNTER — Other Ambulatory Visit: Payer: Self-pay | Admitting: Gastroenterology

## 2020-04-30 DIAGNOSIS — Z1159 Encounter for screening for other viral diseases: Secondary | ICD-10-CM | POA: Diagnosis not present

## 2020-04-30 LAB — SARS CORONAVIRUS 2 (TAT 6-24 HRS): SARS Coronavirus 2: NEGATIVE

## 2020-04-30 NOTE — Telephone Encounter (Signed)
Agree. Thanks

## 2020-05-01 ENCOUNTER — Other Ambulatory Visit: Payer: Self-pay

## 2020-05-01 ENCOUNTER — Ambulatory Visit (AMBULATORY_SURGERY_CENTER): Payer: BC Managed Care – PPO | Admitting: Gastroenterology

## 2020-05-01 ENCOUNTER — Encounter: Payer: Self-pay | Admitting: Gastroenterology

## 2020-05-01 VITALS — BP 121/73 | HR 65 | Temp 98.0°F | Resp 26 | Ht 61.0 in | Wt 131.0 lb

## 2020-05-01 DIAGNOSIS — K635 Polyp of colon: Secondary | ICD-10-CM

## 2020-05-01 DIAGNOSIS — Z1211 Encounter for screening for malignant neoplasm of colon: Secondary | ICD-10-CM | POA: Diagnosis not present

## 2020-05-01 DIAGNOSIS — D125 Benign neoplasm of sigmoid colon: Secondary | ICD-10-CM

## 2020-05-01 DIAGNOSIS — R12 Heartburn: Secondary | ICD-10-CM | POA: Diagnosis not present

## 2020-05-01 DIAGNOSIS — K2970 Gastritis, unspecified, without bleeding: Secondary | ICD-10-CM

## 2020-05-01 DIAGNOSIS — K641 Second degree hemorrhoids: Secondary | ICD-10-CM

## 2020-05-01 DIAGNOSIS — R1010 Upper abdominal pain, unspecified: Secondary | ICD-10-CM

## 2020-05-01 DIAGNOSIS — R1084 Generalized abdominal pain: Secondary | ICD-10-CM

## 2020-05-01 DIAGNOSIS — K5900 Constipation, unspecified: Secondary | ICD-10-CM

## 2020-05-01 DIAGNOSIS — K219 Gastro-esophageal reflux disease without esophagitis: Secondary | ICD-10-CM

## 2020-05-01 DIAGNOSIS — D122 Benign neoplasm of ascending colon: Secondary | ICD-10-CM

## 2020-05-01 DIAGNOSIS — K5730 Diverticulosis of large intestine without perforation or abscess without bleeding: Secondary | ICD-10-CM

## 2020-05-01 HISTORY — PX: UPPER GASTROINTESTINAL ENDOSCOPY: SHX188

## 2020-05-01 HISTORY — PX: COLONOSCOPY: SHX174

## 2020-05-01 MED ORDER — SODIUM CHLORIDE 0.9 % IV SOLN
500.0000 mL | Freq: Once | INTRAVENOUS | Status: DC
Start: 2020-05-01 — End: 2020-05-01

## 2020-05-01 NOTE — Progress Notes (Signed)
Pt's states no medical or surgical changes since previsit or office visit.  Vitals SM 

## 2020-05-01 NOTE — Op Note (Addendum)
[facility] Patient Name: Charlotte Wise Procedure Date: 05/01/2020 7:59 AM MRN: [id number] Endoscopist: Gerrit Heck , MD Age: 45 Referring MD:  Date of Birth: January 26, 1976 Gender: Female Account #: [id number] Procedure:                Colonoscopy Indications:              Screening for colorectal malignant neoplasm Medicines:                Monitored Anesthesia Care Procedure:                Pre-Anesthesia Assessment:                           - Prior to the procedure, a History and Physical                            was performed, and patient medications and                            allergies were reviewed. The patient's tolerance of                            previous anesthesia was also reviewed. The risks                            and benefits of the procedure and the sedation                            options and risks were discussed with the patient.                            All questions were answered, and informed consent                            was obtained. Prior Anticoagulants: The patient has                            taken no previous anticoagulant or antiplatelet                            agents. ASA Grade Assessment: II - A patient with                            mild systemic disease. After reviewing the risks                            and benefits, the patient was deemed in                            satisfactory condition to undergo the procedure.                           After obtaining informed consent, the colonoscope  was passed under direct vision. Throughout the                            procedure, the patient's blood pressure, pulse, and                            oxygen saturations were monitored continuously. The                            Olympus CF-HQ190L [id number]) Colonoscope was                            introduced through the anus and advanced to the the                            cecum, identified  by appendiceal orifice and                            ileocecal valve. The colonoscopy was performed                            without difficulty. The patient tolerated the                            procedure well. The quality of the bowel                            preparation was good. The ileocecal valve,                            appendiceal orifice, and rectum were photographed. Scope In: 8:16:54 AM Scope Out: 8:41:00 AM Scope Withdrawal Time: 0 hours 22 minutes 0 seconds  Total Procedure Duration: 0 hours 24 minutes 6 seconds  Findings:                 Hemorrhoids were found on perianal exam.                           Two sessile polyps were found in the ascending                            colon. The polyps were 2 to 3 mm in size. These                            polyps were removed with a cold biopsy forceps.                            Resection and retrieval were complete. Estimated                            blood loss was minimal.                           A 6 mm polyp was found in the ascending colon.  The                            polyp was flat. The polyp was removed with a cold                            snare. Resection and retrieval were complete.                            Estimated blood loss was minimal.                           Two sessile polyps were found in the sigmoid colon.                            The polyps were 3 to 4 mm in size. These polyps                            were removed with a cold snare. Resection and                            retrieval were complete. Estimated blood loss was                            minimal.                           A few small-mouthed diverticula were found in the                            sigmoid colon and cecum.                           Non-bleeding internal hemorrhoids were found during                            retroflexion. The hemorrhoids were small and Grade                            II (internal  hemorrhoids that prolapse but reduce                            spontaneously). Complications:            No immediate complications. Estimated Blood Loss:     Estimated blood loss was minimal. Impression:               - Hemorrhoids found on perianal exam.                           - Two 2 to 3 mm polyps in the ascending colon,                            removed with a cold biopsy forceps. Resected and  retrieved.                           - One 6 mm polyp in the ascending colon, removed                            with a cold snare. Resected and retrieved.                           - Two 3 to 4 mm polyps in the sigmoid colon,                            removed with a cold snare. Resected and retrieved.                           - Diverticulosis in the sigmoid colon and in the                            cecum.                           - Non-bleeding internal hemorrhoids. Recommendation:           - Patient has a contact number available for                            emergencies. The signs and symptoms of potential                            delayed complications were discussed with the                            patient. Return to normal activities tomorrow.                            Written discharge instructions were provided to the                            patient.                           - Resume previous diet.                           - Continue present medications.                           - Await pathology results.                           - Repeat colonoscopy for surveillance based on                            pathology results.                           - Return to GI clinic  at appointment to be                            scheduled.                           - Internal hemorrhoids were noted on this study and                            may be amenable to hemorrhoid band ligation. If you                            are interested in further  treatment of these                            hemorrhoids with band ligation, please contact my                            clinic to set up an appointment for evaluation and                            treatment. Gerrit Heck, MD 05/01/2020 9:02:01 AM

## 2020-05-01 NOTE — Progress Notes (Signed)
To PACU, VSS. Report to [email or web address] 

## 2020-05-01 NOTE — Patient Instructions (Signed)
Please read handouts provided. Continue present medications, continue Prilosec 20 mg twice daily. Await pathology results. Return to GI clinic at appointment to be scheduled.     YOU HAD AN ENDOSCOPIC PROCEDURE TODAY AT [facility]:   Refer to the procedure report that was given to you for any specific questions about what was found during the examination.  If the procedure report does not answer your questions, please call your gastroenterologist to clarify.  If you requested that your care partner not be given the details of your procedure findings, then the procedure report has been included in a sealed envelope for you to review at your convenience later.  YOU SHOULD EXPECT: Some feelings of bloating in the abdomen. Passage of more gas than usual.  Walking can help get rid of the air that was put into your GI tract during the procedure and reduce the bloating. If you had a lower endoscopy (such as a colonoscopy or flexible sigmoidoscopy) you may notice spotting of blood in your stool or on the toilet paper. If you underwent a bowel prep for your procedure, you may not have a normal bowel movement for a few days.  Please Note:  You might notice some irritation and congestion in your nose or some drainage.  This is from the oxygen used during your procedure.  There is no need for concern and it should clear up in a day or so.  SYMPTOMS TO REPORT IMMEDIATELY:   Following lower endoscopy (colonoscopy or flexible sigmoidoscopy):  Excessive amounts of blood in the stool  Significant tenderness or worsening of abdominal pains  Swelling of the abdomen that is new, acute  Fever of 100F or higher   Following upper endoscopy (EGD)  Vomiting of blood or coffee ground material  New chest pain or pain under the shoulder blades  Painful or persistently difficult swallowing  New shortness of breath  Fever of 100F or higher  Black, tarry-looking stools  For urgent or emergent  issues, a gastroenterologist can be reached at any hour by calling [phone]. Do not use MyChart messaging for urgent concerns.    DIET:  We do recommend a small meal at first, but then you may proceed to your regular diet.  Drink plenty of fluids but you should avoid alcoholic beverages for 24 hours.  ACTIVITY:  You should plan to take it easy for the rest of today and you should NOT DRIVE or use heavy machinery until tomorrow (because of the sedation medicines used during the test).    FOLLOW UP: Our staff will call the number listed on your records 48-72 hours following your procedure to check on you and address any questions or concerns that you may have regarding the information given to you following your procedure. If we do not reach you, we will leave a message.  We will attempt to reach you two times.  During this call, we will ask if you have developed any symptoms of COVID 19. If you develop any symptoms (ie: fever, flu-like symptoms, shortness of breath, cough etc.) before then, please call [phone].  If you test positive for Covid 19 in the 2 weeks post procedure, please call and report this information to [place].    If any biopsies were taken you will be contacted by phone or by letter within the next 1-3 weeks.  Please call us at [phone] if you have not heard about the biopsies in 3 weeks.    SIGNATURES/CONFIDENTIALITY: You  and/or your care partner have signed paperwork which will be entered into your electronic medical record.  These signatures attest to the fact that that the information above on your After Visit Summary has been reviewed and is understood.  Full responsibility of the confidentiality of this discharge information lies with you and/or your care-partner.

## 2020-05-01 NOTE — Op Note (Signed)
[facility] Patient Name: Evey Mcmahan Procedure Date: 05/01/2020 8:00 AM MRN: [id number] Endoscopist: Gerrit Heck , MD Age: 45 Referring MD:  Date of Birth: 1975-05-23 Gender: Female Account #: [id number] Procedure:                Upper GI endoscopy Indications:              Upper abdominal pain, Heartburn, Suspected                            esophageal reflux, Early satiety, Weight loss Medicines:                Monitored Anesthesia Care Procedure:                Pre-Anesthesia Assessment:                           - Prior to the procedure, a History and Physical                            was performed, and patient medications and                            allergies were reviewed. The patient's tolerance of                            previous anesthesia was also reviewed. The risks                            and benefits of the procedure and the sedation                            options and risks were discussed with the patient.                            All questions were answered, and informed consent                            was obtained. Prior Anticoagulants: The patient has                            taken no previous anticoagulant or antiplatelet                            agents. ASA Grade Assessment: II - A patient with                            mild systemic disease. After reviewing the risks                            and benefits, the patient was deemed in                            satisfactory condition to undergo the procedure.  After obtaining informed consent, the endoscope was                            passed under direct vision. Throughout the                            procedure, the patient's blood pressure, pulse, and                            oxygen saturations were monitored continuously. The                            Endoscope was introduced through the mouth, and                            advanced to the  second part of duodenum. The upper                            GI endoscopy was accomplished without difficulty.                            The patient tolerated the procedure well. Scope In: Scope Out: Findings:                 One tongue of salmon-colored mucosa was present                            from 37 to 39 cm. No other visible abnormalities                            were present. The maximum longitudinal extent of                            these esophageal mucosal changes was 2 cm in                            length. Biopsies were taken with a cold forceps for                            histology. Estimated blood loss was minimal.                           The upper third of the esophagus and middle third                            of the esophagus were normal.                           The gastroesophageal flap valve was visualized                            endoscopically and classified as Hill Grade I                            (  prominent fold, tight to endoscope).                           Scattered minimal inflammation characterized by                            erythema was found in the gastric body and in the                            gastric antrum. Biopsies were taken with a cold                            forceps for Helicobacter pylori testing. Estimated                            blood loss was minimal.                           The examined duodenum was normal. Biopsies were                            taken with a cold forceps for histology. Estimated                            blood loss was minimal. Complications:            No immediate complications. Estimated Blood Loss:     Estimated blood loss was minimal. Impression:               - Salmon-colored mucosa. Biopsied.                           - Normal upper third of esophagus and middle third                            of esophagus.                           - Gastroesophageal flap valve classified as Hill                             Grade I (prominent fold, tight to endoscope).                           - Gastritis. Biopsied.                           - Normal examined duodenum. Biopsied. Recommendation:           - Patient has a contact number available for                            emergencies. The signs and symptoms of potential                            delayed complications were discussed with the  patient. Return to normal activities tomorrow.                            Written discharge instructions were provided to the                            patient.                           - Resume previous diet.                           - Await pathology results.                           - Continue Prilosec 20 mg BID as prescribed.                           - Return to GI clinic at appointment to be                            scheduled. Gerrit Heck, MD 05/01/2020 9:02:27 AM

## 2020-05-01 NOTE — Progress Notes (Signed)
Called to room to assist during endoscopic procedure.  Patient ID and intended procedure confirmed with present staff. Received instructions for my participation in the procedure from the performing physician.  

## 2020-05-03 ENCOUNTER — Encounter: Payer: Self-pay | Admitting: Adult Health

## 2020-05-03 ENCOUNTER — Telehealth: Payer: Self-pay

## 2020-05-03 ENCOUNTER — Ambulatory Visit: Payer: BC Managed Care – PPO | Admitting: Adult Health

## 2020-05-03 ENCOUNTER — Other Ambulatory Visit: Payer: Self-pay

## 2020-05-03 VITALS — BP 132/84 | HR 81 | Temp 97.3°F | Wt 131.0 lb

## 2020-05-03 DIAGNOSIS — F419 Anxiety disorder, unspecified: Secondary | ICD-10-CM | POA: Diagnosis not present

## 2020-05-03 MED ORDER — HYDROXYZINE HCL 50 MG PO TABS: 50.0000 mg | ORAL_TABLET | Freq: Four times a day (QID) | ORAL | 0 refills | Status: DC | PRN

## 2020-05-03 NOTE — Progress Notes (Signed)
Assessment and Plan:  Charlotte Wise was seen today for anxiety.  Diagnoses and all orders for this visit:  Anxiety Continue cymbalta; benefit with pain; disucssed may have more mood benefit in next month Stop buspar as no perceived benefit Start new medication as prescribed Anxiety may be short term r/t abdominal pain and workup in progress Had recent normal labs- CBC, TSH, electrolytes Stress management techniques discussed, increase water, good sleep hygiene discussed, increase exercise, and increase veggies.  Follow up 1 month, call the office if any new AE's from medications and we will switch them -     hydrOXYzine (ATARAX/VISTARIL) 50 MG tablet; Take 1-2 tablets (50-100 mg total) by mouth every 6 (six) hours as needed for anxiety.  Further disposition pending results of labs. Discussed med's effects and SE's.   Over 15 minutes of exam, counseling, chart review, and critical decision making was performed.   Future Appointments  Date Time Provider [place]  05/18/2020  9:30 AM Charlotte Comber, NP [facility] None    ------------------------------------------------------------------------------------------------------------------   HPI BP 132/84   Pulse 81   Temp (!) 97.3 F (36.3 C)   Wt 131 lb (59.4 kg)   SpO2 99%   BMI 24.75 kg/m   44 y.o.female with hx of hypothyroid, schizophreniform disorder, chronic pain and current GERD/gastritis being worked up by GI presents for evaluation of anxiety. She just had EGD/colonoscopy on 05/01/2020 by Charlotte Wise, showed gastritis and colon polyps, pending path. She has been on omeprazole 20 mg BID, also taking pepto bismol, hasn't noted any improvement yet,   She just started on cymbalta 60 mg by neuro for chronic pain 1 month ago, has noted pain benefit, decreased gabapentin use. Unfortunately hasn't noted benefit with anxiety thus far. She called in to the office on 04/19/2020 c/o anxiety, Charlotte Wise sent in buspar 10 mg TID; she  reports no perceived benefit with this. Has been needing temazepam daily to sleep which is atypical. Wants short term PRN option if possible for anxiety-   Denies ETOH, drug use -   Past Medical History:  Diagnosis Date  . Allergy   . Anxiety   . Arthritis   . Hyperlipidemia 05/12/2018  . 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 Sciatica of right side 07/27/2019  . Thyroid disease      Allergies  Allergen Reactions  . Hydrocodone Itching  . Oxycodone-Acetaminophen   . Penicillins Swelling    Current Outpatient Medications on File Prior to Visit  Medication Sig  . acetaminophen (TYLENOL) 500 MG tablet Take 500 mg by mouth every 6 (six) hours as needed. Takes 0-6 per day depending on her pain level  . aluminum-magnesium hydroxide-simethicone (MAALOX) [phone] MG/5ML SUSP Take 20 mLs by mouth 3 (three) times daily.  . Aspirin Effervescent (ALKA-SELTZER PO) Take 1 tablet by mouth as needed.  . busPIRone (BUSPAR) 10 MG tablet Take 1 tablet (10 mg total) by mouth 3 (three) times daily.  . Ca Carbonate-Mag Hydroxide (ROLAIDS PO) Take 1 each by mouth as needed.  . DULoxetine (CYMBALTA) 60 MG capsule Take 1 capsule (60 mg total) by mouth daily.  Marland Kitchen gabapentin (NEURONTIN) 300 MG capsule Take 300 mg by mouth as needed.  Marland Kitchen levothyroxine (SYNTHROID) 75 MCG tablet TAKE 1 TABLET DAILY ON EMPTY STOMACH WITH ONLY WATER FOR 30 MINUTES & NO ANTACID MEDS, CALCIUM OR MAGNESIUM FOR 4 HOURS AND AVOID BIOTIN  . MAGNESIUM PO Take 400 mg by  mouth daily.  . Multiple Vitamins-Minerals (MULTIVITAMIN ADULTS PO) Take 1 tablet by mouth daily.  [name] 3-6-9 Fatty Acids (OMEGA-3-6-9 PO) Take 1 Dose by mouth daily.  Marland Kitchen omeprazole (PRILOSEC) 20 MG capsule Take 1 capsule (20 mg total) by mouth 2 (two) times daily before a meal.  . temazepam (RESTORIL) 30 MG capsule Take 30 mg by mouth as needed for sleep.  . Valerian 100 MG CAPS  OTC taking PRN anxiety  . VITAMIN D PO Take 10,000 Units by mouth daily.   No current facility-administered medications on file prior to visit.    ROS: all negative except above.   Physical Exam:  BP 132/84   Pulse 81   Temp (!) 97.3 F (36.3 C)   Wt 131 lb (59.4 kg)   SpO2 99%   BMI 24.75 kg/m   General Appearance: Well nourished, in no apparent distress. Eyes: PERRLA, EOMs, conjunctiva no swelling or erythema Sinuses: No Frontal/maxillary tenderness ENT/Mouth: Ext aud canals clear, TMs without erythema, bulging. No erythema, swelling, or exudate on post pharynx.  Tonsils not swollen or erythematous. Hearing normal.  Neck: Supple, thyroid normal.  Respiratory: Respiratory effort normal, BS equal bilaterally without rales, rhonchi, wheezing or stridor.  Cardio: RRR with no MRGs. Brisk peripheral pulses without edema.  Lymphatics: Non tender without lymphadenopathy.  Musculoskeletal: No obvious deformity, symmetrical strength, normal gait.  Skin: Warm, dry without rashes, lesions, ecchymosis.  Neuro: Cranial nerves intact. Normal muscle tone, no cerebellar symptoms. Sensation intact.  Psych: Awake and oriented X 3, anxious affect, Insight and Judgment appropriate.     Izora Ribas, NP 10:26 AM [facility]

## 2020-05-03 NOTE — Patient Instructions (Signed)
Hydroxyzine capsules or tablets What is this medicine? HYDROXYZINE (hye DROX i zeen) is an antihistamine. This medicine is used to treat allergy symptoms. It is also used to treat anxiety and tension. This medicine can be used with other medicines to induce sleep before surgery. This medicine may be used for other purposes; ask your health care provider or pharmacist if you have questions. COMMON BRAND NAME(S): ANX, Atarax, Rezine, Vistaril What should I tell my health care provider before I take this medicine? They need to know if you have any of these conditions:  glaucoma  heart disease  history of irregular heartbeat  kidney disease  liver disease  lung or breathing disease, like asthma  stomach or intestine problems  thyroid disease  trouble passing urine  an unusual or allergic reaction to hydroxyzine, cetirizine, other medicines, foods, dyes or preservatives  pregnant or trying to get pregnant  breast-feeding How should I use this medicine? Take this medicine by mouth with a full glass of water. Follow the directions on the prescription label. You may take this medicine with food or on an empty stomach. Take your medicine at regular intervals. Do not take your medicine more often than directed. Talk to your pediatrician regarding the use of this medicine in children. Special care may be needed. While this drug may be prescribed for children as young as 6 years of age for selected conditions, precautions do apply. Patients over 65 years old may have a stronger reaction and need a smaller dose. Overdosage: If you think you have taken too much of this medicine contact a poison control center or emergency room at once. NOTE: This medicine is only for you. Do not share this medicine with others. What if I miss a dose? If you miss a dose, take it as soon as you can. If it is almost time for your next dose, take only that dose. Do not take double or extra doses. What may  interact with this medicine? Do not take this medicine with any of the following medications:  cisapride  dronedarone  pimozide  thioridazine This medicine may also interact with the following medications:  alcohol  antihistamines for allergy, cough, and cold  atropine  barbiturate medicines for sleep or seizures, like phenobarbital  certain antibiotics like erythromycin or clarithromycin  certain medicines for anxiety or sleep  certain medicines for bladder problems like oxybutynin, tolterodine  certain medicines for depression or psychotic disturbances  certain medicines for irregular heart beat  certain medicines for Parkinson's disease like benztropine, trihexyphenidyl  certain medicines for seizures like phenobarbital, primidone  certain medicines for stomach problems like dicyclomine, hyoscyamine  certain medicines for travel sickness like scopolamine  ipratropium  narcotic medicines for pain  other medicines that prolong the QT interval (an abnormal heart rhythm) like dofetilide This list may not describe all possible interactions. Give your health care provider a list of all the medicines, herbs, non-prescription drugs, or dietary supplements you use. Also tell them if you smoke, drink alcohol, or use illegal drugs. Some items may interact with your medicine. What should I watch for while using this medicine? Tell your doctor or health care professional if your symptoms do not improve. You may get drowsy or dizzy. Do not drive, use machinery, or do anything that needs mental alertness until you know how this medicine affects you. Do not stand or sit up quickly, especially if you are an older patient. This reduces the risk of dizzy or fainting spells. Alcohol may   interfere with the effect of this medicine. Avoid alcoholic drinks. Your mouth may get dry. Chewing sugarless gum or sucking hard candy, and drinking plenty of water may help. Contact your doctor if the  problem does not go away or is severe. This medicine may cause dry eyes and blurred vision. If you wear contact lenses you may feel some discomfort. Lubricating drops may help. See your eye doctor if the problem does not go away or is severe. If you are receiving skin tests for allergies, tell your doctor you are using this medicine. What side effects may I notice from receiving this medicine? Side effects that you should report to your doctor or health care professional as soon as possible:  allergic reactions like skin rash, itching or hives, swelling of the face, lips, or tongue  changes in vision  confusion  fast, irregular heartbeat  seizures  tremor  trouble passing urine or change in the amount of urine Side effects that usually do not require medical attention (report to your doctor or health care professional if they continue or are bothersome):  constipation  drowsiness  dry mouth  headache  tiredness This list may not describe all possible side effects. Call your doctor for medical advice about side effects. You may report side effects to FDA at 1-800-FDA-1088. Where should I keep my medicine? Keep out of the reach of children. Store at room temperature between 15 and 30 degrees C (59 and 86 degrees F). Keep container tightly closed. Throw away any unused medicine after the expiration date. NOTE: This sheet is a summary. It may not cover all possible information. If you have questions about this medicine, talk to your doctor, pharmacist, or health care provider.  2021 Elsevier/Gold Standard (2018-03-01 13:19:55)  

## 2020-05-03 NOTE — Telephone Encounter (Signed)
  Follow up Call-  Call back number 05/01/2020  Post procedure Call Back phone  # [phone]  Permission to leave phone message Yes  Some recent data might be hidden     Patient questions:  Do you have a fever, pain , or abdominal swelling? No. Pain Score  0 *  Have you tolerated food without any problems? Yes.    Have you been able to return to your normal activities? Yes.    Do you have any questions about your discharge instructions: Diet   No. Medications  No. Follow up visit  No.  Do you have questions or concerns about your Care? No.  Actions: * If pain score is 4 or above: No action needed, pain <4.  1. Have you developed a fever since your procedure? no  2.   Have you had an respiratory symptoms (SOB or cough) since your procedure? no  3.   Have you tested positive for COVID 19 since your procedure no  4.   Have you had any family members/close contacts diagnosed with the COVID 19 since your procedure?  no   If yes to any of these questions please route to Joylene John, RN and Joella Prince, RN

## 2020-05-04 DIAGNOSIS — M545 Low back pain, unspecified: Secondary | ICD-10-CM | POA: Diagnosis not present

## 2020-05-04 DIAGNOSIS — M5013 Cervical disc disorder with radiculopathy, cervicothoracic region: Secondary | ICD-10-CM | POA: Diagnosis not present

## 2020-05-07 ENCOUNTER — Encounter: Payer: Self-pay | Admitting: Gastroenterology

## 2020-05-14 ENCOUNTER — Encounter: Payer: BC Managed Care – PPO | Admitting: Adult Health

## 2020-05-16 ENCOUNTER — Other Ambulatory Visit: Payer: Self-pay | Admitting: Adult Health

## 2020-05-16 MED ORDER — ALPRAZOLAM 0.5 MG PO TABS: ORAL_TABLET | ORAL | 0 refills | Status: DC

## 2020-05-17 ENCOUNTER — Encounter: Payer: BC Managed Care – PPO | Admitting: Adult Health

## 2020-05-18 ENCOUNTER — Ambulatory Visit (INDEPENDENT_AMBULATORY_CARE_PROVIDER_SITE_OTHER): Payer: BC Managed Care – PPO | Admitting: Adult Health

## 2020-05-18 ENCOUNTER — Other Ambulatory Visit: Payer: Self-pay

## 2020-05-18 ENCOUNTER — Encounter: Payer: Self-pay | Admitting: Adult Health

## 2020-05-18 VITALS — BP 122/74 | HR 83 | Temp 97.9°F | Ht 61.25 in | Wt 131.2 lb

## 2020-05-18 DIAGNOSIS — Z79899 Other long term (current) drug therapy: Secondary | ICD-10-CM

## 2020-05-18 DIAGNOSIS — Z0000 Encounter for general adult medical examination without abnormal findings: Secondary | ICD-10-CM

## 2020-05-18 DIAGNOSIS — F419 Anxiety disorder, unspecified: Secondary | ICD-10-CM

## 2020-05-18 DIAGNOSIS — Z13220 Encounter for screening for lipoid disorders: Secondary | ICD-10-CM

## 2020-05-18 DIAGNOSIS — E038 Other specified hypothyroidism: Secondary | ICD-10-CM

## 2020-05-18 DIAGNOSIS — Z130 Encounter for screening for diseases of the blood and blood-forming organs and certain disorders involving the immune mechanism: Secondary | ICD-10-CM

## 2020-05-18 DIAGNOSIS — Z1329 Encounter for screening for other suspected endocrine disorder: Secondary | ICD-10-CM | POA: Diagnosis not present

## 2020-05-18 DIAGNOSIS — F2081 Schizophreniform disorder: Secondary | ICD-10-CM

## 2020-05-18 DIAGNOSIS — Z0001 Encounter for general adult medical examination with abnormal findings: Secondary | ICD-10-CM

## 2020-05-18 DIAGNOSIS — Z23 Encounter for immunization: Secondary | ICD-10-CM | POA: Diagnosis not present

## 2020-05-18 DIAGNOSIS — Z86010 Personal history of colonic polyps: Secondary | ICD-10-CM

## 2020-05-18 DIAGNOSIS — Z1389 Encounter for screening for other disorder: Secondary | ICD-10-CM | POA: Diagnosis not present

## 2020-05-18 DIAGNOSIS — K2900 Acute gastritis without bleeding: Secondary | ICD-10-CM

## 2020-05-18 DIAGNOSIS — R809 Proteinuria, unspecified: Secondary | ICD-10-CM

## 2020-05-18 DIAGNOSIS — E785 Hyperlipidemia, unspecified: Secondary | ICD-10-CM

## 2020-05-18 DIAGNOSIS — F325 Major depressive disorder, single episode, in full remission: Secondary | ICD-10-CM

## 2020-05-18 DIAGNOSIS — E559 Vitamin D deficiency, unspecified: Secondary | ICD-10-CM

## 2020-05-18 DIAGNOSIS — Z6824 Body mass index (BMI) 24.0-24.9, adult: Secondary | ICD-10-CM

## 2020-05-18 DIAGNOSIS — G4700 Insomnia, unspecified: Secondary | ICD-10-CM

## 2020-05-18 DIAGNOSIS — D649 Anemia, unspecified: Secondary | ICD-10-CM

## 2020-05-18 NOTE — Patient Instructions (Addendum)
  Ms. Booz , Thank you for taking time to come for your Annual Wellness Visit. I appreciate your ongoing commitment to your health goals. Please review the following plan we discussed and let me know if I can assist you in the future.   These are the goals we discussed: Goals    . Exercise 150 min/wk Moderate Activity       This is a list of the screening recommended for you and due dates:  Health Maintenance  Topic Date Due  . Tetanus Vaccine  Never done  . Flu Shot  06/21/2020*  . Pap Smear  10/22/2020  . Colon Cancer Screening  05/01/2030  .  Hepatitis C: One time screening is recommended by Center for Disease Control  (CDC) for  adults born from [birth year] through 1966.   Discontinued  . HIV Screening  Discontinued  *Topic was postponed. The date shown is not the original due date.    Please be very careful with supplements - start slowly, check labels to make sure not doubling up, monitor closely for any adverse effects after adding new. Less is generally better, try to get nutrients from food as much as possible.    Can initiate omeprazole taper after 12 weeks if stomach is doing well - please reach out to me for instructions   Know what a healthy weight is for you (roughly BMI <25) and aim to maintain this  Aim for 7+ servings of fruits and vegetables daily  65-80+ fluid ounces of water or unsweet tea for healthy kidneys  Limit to max 1 drink of alcohol per day; avoid smoking/tobacco  Limit animal fats in diet for cholesterol and heart health - choose grass fed whenever available  Avoid highly processed foods, and foods high in saturated/trans fats  Aim for low stress - take time to unwind and care for your mental health  Aim for 150 min of moderate intensity exercise weekly for heart health, and weights twice weekly for bone health  Aim for 7-9 hours of sleep daily       A great goal to work towards is aiming to get in a serving daily of some of the most  nutritionally dense foods - G- BOMBS daily

## 2020-05-18 NOTE — Progress Notes (Signed)
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 Stephanie Acre; no longer on medications, declined further follow up  Hypothyroidism, unspecified type continue medications the same pending lab results reminded to take on an empty stomach 30-36mins before food.  -     TSH  Vitamin D deficiency Continue with supplementation for goal of 70-100 -     VITAMIN D 25 Hydroxy (Vit-D Deficiency, Fractures)  Hyperlipdemia Much improved with weight loss and lifestyle changes Continue low fat diet and exercise -     Lipid panel - defer recheck   Screening for diabetes mellitus -     Hemoglobin A1c - shared decision making, declines this year  Medication management -     CBC with Differential/Platelet -     CMP/GFR -     Urinalysis w microscopic + reflex cultur -     Magnesium   BMI 24 Continue to recommend diet heavy in fruits and veggies and low in animal meats, cheeses, and dairy products, appropriate calorie intake Discuss exercise recommendations routinely Continue to monitor weight at each visit  Anxiety Continue cymbalta for pain benefit; vistaril/xanax PRN, reviewed limiting use due to tolerance potential  Stress management techniques discussed, increase water, good sleep hygiene discussed, increase exercise, and increase veggies.   Headaches she reports well managed by dietary modification Declines further evaluation or management at this time  Gastritis Continue omeprazole 12 weeks, lifestyle reviewed; avoid ingesting anything other than food and approved supplements Discussed diet, avoiding triggers and other lifestyle changes Stress management If doing well at 12 weeks reach out for taper instructions  Diverticulosis Bowel management reviewed; add fiber if  needed  History adenomatous colon polyps Next colonoscopy due 04/2022; bowel management; add fiber supplement if needed  Need for tetanus vaccine  - Tdap administered without complication  Orders Placed This Encounter  Procedures  . Tdap vaccine greater than or equal to 7yo IM  . CBC with Differential/Platelet  . COMPLETE METABOLIC PANEL WITH GFR  . Magnesium  . TSH  . VITAMIN D 25 Hydroxy (Vit-D Deficiency, Fractures)  . Microalbumin / creatinine urine ratio  . Urinalysis, Routine w reflex microscopic  . Vitamin B12  . Iron, TIBC and Ferritin Panel     Discussed med's effects and SE's. Screening labs and tests as requested with regular follow-up as recommended. Over 40 minutes of exam, counseling, chart review, and complex, high level critical decision making was performed this visit.   Future Appointments  Date Time Provider [place]  06/12/2020  9:00 AM Lavena Bullion, DO [facility]  11/21/2020  9:30 AM Liane Comber, NP [facility] None  05/20/2021  9:00 AM Liane Comber, NP [facility] None    HPI  45 y.o. female  presents for a complete physical and follow up for has Schizophreniform disorder ([place]); Hypothyroid; Vitamin D deficiency; BMI 24.0-24.9, adult; Early dry stage nonexudative age-related macular degeneration of both eyes; Insomnia; Hyperlipidemia; Major depression in remission ([place]); Neck pain; Sciatica of right side; Hyperreflexia; Mid back pain; Family history of MS (multiple sclerosis); Pain of right lower extremity; Right arm pain; Anxiety; Acute gastritis without hemorrhage; and History of adenomatous polyp of colon on their problem list.   She is married, 1 son, 87 y/o in [employer].  She is an Chief Financial Officer by trade, currently working part time  in retail, she reports loves her job, much lower stress. Followed annually by GYN Dr. Estelle June at [place], PAP smears, has IUD, getting mammogram annually at breast center, last 10/2019.   She was previously  followed by Dr. Stephanie Acre for schizophreniform disorder, and was seeing [name] (counsellor) regularly as well in 2018, but reports she has tapered off of medications and was doing well. She does not believe the diagnoses was accurate.   She was referred to neuro for unexplained paresthesias/muscle pain, unremarkable workup and was started on cymbalta 60 mg by neuro, has noted pain benefit, decreased gabapentin use. She was having anxiety, buspar not helpful, given vistaril, also has xanax 0.5 mg PRN but trying to limit use.   She recently had epigastric pain, ? Onset after ingesting essential oils just had EGD/colonoscopy on 05/01/2020 by Dr. Bryan Lemma, showed gastritis and some adenomatous colon polyps recommended for 3 year recall. She has been on omeprazole 20 mg BID, also taking pepto bismol, hasn't noted any improvement yet,   BMI is Body mass index is 24.59 kg/m., she is working on diet and exercise.  She walks most days, does yoga. She does watch her diet, has lost weight previously, plans to get down to 130 lb over the next year or so.  Drinks mainly water, aims 75-100 fluid ounces of water daily.  Has stopped coffee, avoiding caffeine, has cut out alcohol Wt Readings from Last 3 Encounters:  05/18/20 131 lb 3.2 oz (59.5 kg)  05/03/20 131 lb (59.4 kg)  05/01/20 131 lb (59.4 kg)   Her blood pressure has been controlled at home (110/70), today their BP is BP: 122/74 She does workout. She denies chest pain, shortness of breath, dizziness.   She is not on cholesterol medication. Her cholesterol is not at goal. The cholesterol last visit was:   Lab Results  Component Value Date   CHOL 165 05/18/2019   HDL 58 05/18/2019   LDLCALC 93 05/18/2019   TRIG 53 05/18/2019   CHOLHDL 2.8 05/18/2019    Last A1C in the office was:  Lab Results  Component Value Date   HGBA1C 5.1 05/18/2019   She is on thyroid medication. Her medication was not changed last visit. Take 75 mcg daily,  1/2 tab on Mon/Thurs.   Lab Results  Component Value Date   TSH 1.43 04/04/2020   Last GFR: Lab Results  Component Value Date   GFRNONAA 89 04/04/2020   Patient is on Vitamin D supplement, not taking as frequently due to recent GI flare:    Lab Results  Component Value Date   VD25OH 64 05/18/2019        Current Medications:  Current Outpatient Medications on File Prior to Visit  Medication Sig Dispense Refill  . ALPRAZolam (XANAX) 0.5 MG tablet Take 1 tab up to three times a day if needed for severe anxiety. Avoid taking daily, try to limit to <5 days/week. 30 tablet 0  . aluminum-magnesium hydroxide-simethicone (MAALOX) 361-443-15 MG/5ML SUSP Take 20 mLs by mouth 3 (three) times daily.    . Ca Carbonate-Mag Hydroxide (ROLAIDS PO) Take 1 each by mouth as needed.    . DULoxetine (CYMBALTA) 60 MG capsule Take 1 capsule (60 mg total) by mouth daily. 30 capsule 5  . gabapentin (NEURONTIN) 300 MG capsule Take 300 mg by mouth as needed.    . hydrOXYzine (ATARAX/VISTARIL) 50 MG tablet Take 1-2 tablets (50-100 mg total) by mouth every 6 (six) hours as needed for anxiety. [place]  tablet 0  . levothyroxine (SYNTHROID) 75 MCG tablet TAKE 1 TABLET DAILY ON EMPTY STOMACH WITH ONLY WATER FOR 30 MINUTES & NO ANTACID MEDS, CALCIUM OR MAGNESIUM FOR 4 HOURS AND AVOID BIOTIN 90 tablet 3  . MAGNESIUM PO Take 400 mg by mouth daily.    . Multiple Vitamins-Minerals (MULTIVITAMIN ADULTS PO) Take 1 tablet by mouth daily.    [name] 3-6-9 Fatty Acids (OMEGA-3-6-9 PO) Take 1 Dose by mouth daily.    Marland Kitchen omeprazole (PRILOSEC) 20 MG capsule Take 1 capsule (20 mg total) by mouth 2 (two) times daily before a meal. 60 capsule 2  . Valerian 100 MG CAPS OTC taking PRN anxiety 840 capsule 0  . VITAMIN D PO Take 10,000 Units by mouth daily.    Marland Kitchen acetaminophen (TYLENOL) 500 MG tablet Take 500 mg by mouth every 6 (six) hours as needed. Takes 0-6 per day depending on her pain level (Patient not taking: Reported on 05/18/2020)     . Aspirin Effervescent (ALKA-SELTZER PO) Take 1 tablet by mouth as needed. (Patient not taking: Reported on 05/18/2020)     No current facility-administered medications on file prior to visit.   Allergies:  Allergies  Allergen Reactions  . Hydrocodone Itching  . Oxycodone-Acetaminophen   . Penicillins Swelling   Medical History:  She has Schizophreniform disorder ([place]); Hypothyroid; Vitamin D deficiency; BMI 24.0-24.9, adult; Early dry stage nonexudative age-related macular degeneration of both eyes; Insomnia; Hyperlipidemia; Major depression in remission ([place]); Neck pain; Sciatica of right side; Hyperreflexia; Mid back pain; Family history of MS (multiple sclerosis); Pain of right lower extremity; Right arm pain; Anxiety; Acute gastritis without hemorrhage; and History of adenomatous polyp of colon on their problem list. Health Maintenance:   Immunization History  Administered Date(s) Administered  . Influenza,inj,quad, With Preservative 01/17/2016  . Tdap 05/18/2020   Tetanus: - DUE - give Tdap today  Flu vaccine: 2017, declines  LMP: No LMP recorded. Patient is perimenopausal. Pap:    By GYN - annually - has scheduled 07/2020 MGM:  By GYN - annually - last 10/28/2019  Head CT: 04/18/2016  Last Dental Exam: 2022- goes q6 Last Eye Exam: Annually, last 02/2021, has ? macular degeneration, - new provider disagreed, no evidence   Patient Care Team: Unk Pinto, MD as PCP - General (Internal Medicine)  Surgical History:  She has a past surgical history that includes Tonsillectomy and adenoidectomy (1981); Knee arthroscopy (Left, 1988); Knee arthroscopy (Right, 1999); Ankle arthroscopy (Right, 2000); laparoscopy (2001); Nasal polyp surgery (2006); Colonoscopy (05/01/2020); and Upper gastrointestinal endoscopy (05/01/2020). Family History:  Herfamily history includes Alcohol abuse in her brother, father, and mother; Clotting disorder in her mother; Drug abuse in her brother, father,  and mother; Heart disease in her brother and paternal grandfather; Kidney disease in her maternal grandfather; Other in her mother; Stroke in her paternal grandfather; Thyroid cancer in her maternal grandmother. Social History:  She reports that she has never smoked. She has never used smokeless tobacco. She reports previous alcohol use. She reports that she does not use drugs.  Review of Systems: Review of Systems  Constitutional: Negative for malaise/fatigue and weight loss.  HENT: Negative for hearing loss and tinnitus.   Eyes: Negative for blurred vision and double vision.  Respiratory: Negative for cough, shortness of breath and wheezing.   Cardiovascular: Negative for chest pain, palpitations, orthopnea, claudication and leg swelling.  Gastrointestinal: Positive for heartburn (improving). Negative for abdominal pain, blood in stool, constipation, diarrhea, melena, nausea and vomiting.  Bloating  Genitourinary: Negative.   Musculoskeletal: Negative for joint pain and myalgias.  Skin: Negative for rash.  Neurological: Positive for headaches. Negative for dizziness, tingling, sensory change and weakness.  Endo/Heme/Allergies: Negative for environmental allergies and polydipsia.  Psychiatric/Behavioral: Negative for depression, hallucinations, memory loss, substance abuse and suicidal ideas. The patient is nervous/anxious. The patient does not have insomnia.   All other systems reviewed and are negative.   Physical Exam: Estimated body mass index is 24.59 kg/m as calculated from the following:   Height as of this encounter: 5' 1.25" (1.556 m).   Weight as of this encounter: 131 lb 3.2 oz (59.5 kg). BP 122/74   Pulse 83   Temp 97.9 F (36.6 C)   Ht 5' 1.25" (1.556 m)   Wt 131 lb 3.2 oz (59.5 kg)   SpO2 99%   BMI 24.59 kg/m  General Appearance: Well nourished, in no apparent distress.  Eyes: PERRLA, EOMs, conjunctiva no swelling or erythema Sinuses: No Frontal/maxillary  tenderness  ENT/Mouth: Ext aud canals clear, normal light reflex with TMs without erythema, bulging. Good dental hygiene, oral MM intact. Hearing normal.  Neck: Supple, thyroid normal. No bruits  Respiratory: Respiratory effort normal, BS equal bilaterally without rales, rhonchi, wheezing or stridor.  Cardio: RRR without murmurs, rubs or gallops. Brisk peripheral pulses without edema.  Chest: symmetric, with normal excursions and percussion.  Breasts: Declines, defer to GYN Abdomen: Soft, nontender, no guarding, rebound, hernias, masses, or organomegaly.  Lymphatics: Non tender without lymphadenopathy.  Genitourinary: Defer to GYN Musculoskeletal: Full ROM all peripheral extremities,5/5 strength, and normal gait.  Skin: Warm, dry without rashes, lesions, ecchymosis. Neuro: Cranial nerves intact, reflexes equal bilaterally. Normal muscle tone, no cerebellar symptoms. Sensation intact.  Psych: Awake and oriented X 3, normal affect, Insight and Judgment fair.   EKG: WNL baseline in chart, defer  Izora Ribas 4:03 PM [facility]

## 2020-05-19 LAB — COMPLETE METABOLIC PANEL WITH GFR
AG Ratio: 1.9 (calc) (ref 1.0–2.5)
ALT: 14 U/L (ref 6–29)
AST: 15 U/L (ref 10–35)
Albumin: 4.5 g/dL (ref 3.6–5.1)
Alkaline phosphatase (APISO): 47 U/L (ref 31–125)
BUN: 12 mg/dL (ref 7–25)
CO2: 27 mmol/L (ref 20–32)
Calcium: 9.3 mg/dL (ref 8.6–10.2)
Chloride: 103 mmol/L (ref 98–110)
Creat: 0.68 mg/dL (ref 0.50–1.10)
GFR, Est African American: 122 mL/min/{1.73_m2} (ref >=60)
GFR, Est Non African American: 106 mL/min/{1.73_m2} (ref >=60)
Globulin: 2.4 g/dL (calc) (ref 1.9–3.7)
Glucose, Bld: 82 mg/dL (ref 65–99)
Potassium: 4.3 mmol/L (ref 3.5–5.3)
Sodium: 138 mmol/L (ref 135–146)
Total Bilirubin: 1.6 mg/dL — ABNORMAL HIGH (ref 0.2–1.2)
Total Protein: 6.9 g/dL (ref 6.1–8.1)

## 2020-05-19 LAB — URINALYSIS, ROUTINE W REFLEX MICROSCOPIC
Bilirubin Urine: NEGATIVE
Glucose, UA: NEGATIVE
Hgb urine dipstick: NEGATIVE
Ketones, ur: NEGATIVE
Leukocytes,Ua: NEGATIVE
Nitrite: NEGATIVE
Protein, ur: NEGATIVE
Specific Gravity, Urine: 1.002 (ref 1.001–1.03)
pH: 7.5 (ref 5.0–8.0)

## 2020-05-19 LAB — CBC WITH DIFFERENTIAL/PLATELET
Absolute Monocytes: 610 cells/uL (ref 200–950)
Basophils Absolute: 61 cells/uL (ref 0–200)
Basophils Relative: 1 %
Eosinophils Absolute: 98 cells/uL (ref 15–500)
Eosinophils Relative: 1.6 %
HCT: 37.8 % (ref 35.0–45.0)
Hemoglobin: 12.8 g/dL (ref 11.7–15.5)
Lymphs Abs: 1342 cells/uL (ref 850–3900)
MCH: 32.8 pg (ref 27.0–33.0)
MCHC: 33.9 g/dL (ref 32.0–36.0)
MCV: 96.9 fL (ref 80.0–100.0)
MPV: 9.3 fL (ref 7.5–12.5)
Monocytes Relative: 10 %
Neutro Abs: 3989 cells/uL (ref 1500–7800)
Neutrophils Relative %: 65.4 %
Platelets: 386 10*3/uL (ref 140–400)
RBC: 3.9 10*6/uL (ref 3.80–5.10)
RDW: 12.8 % (ref 11.0–15.0)
Total Lymphocyte: 22 %
WBC: 6.1 10*3/uL (ref 3.8–10.8)

## 2020-05-19 LAB — IRON,TIBC AND FERRITIN PANEL
%SAT: 46 % (calc) — ABNORMAL HIGH (ref 16–45)
Ferritin: 49 ng/mL (ref 16–232)
Iron: 146 ug/dL (ref 40–190)
TIBC: 317 mcg/dL (calc) (ref 250–450)

## 2020-05-19 LAB — VITAMIN B12: Vitamin B-12: 834 pg/mL (ref 200–1100)

## 2020-05-19 LAB — MAGNESIUM: Magnesium: 2.2 mg/dL (ref 1.5–2.5)

## 2020-05-19 LAB — MICROALBUMIN / CREATININE URINE RATIO
Creatinine, Urine: 11 mg/dL — ABNORMAL LOW (ref 20–275)
Microalb, Ur: <0.2 mg/dL

## 2020-05-19 LAB — TSH: TSH: 0.77 mIU/L

## 2020-05-19 LAB — VITAMIN D 25 HYDROXY (VIT D DEFICIENCY, FRACTURES): Vit D, 25-Hydroxy: 85 ng/mL (ref 30–100)

## 2020-05-28 ENCOUNTER — Other Ambulatory Visit: Payer: Self-pay | Admitting: *Deleted

## 2020-05-28 MED ORDER — DULOXETINE HCL 30 MG PO CPEP: 90.0000 mg | ORAL_CAPSULE | Freq: Every day | ORAL | 5 refills | Status: DC

## 2020-06-12 ENCOUNTER — Other Ambulatory Visit: Payer: Self-pay

## 2020-06-12 ENCOUNTER — Ambulatory Visit: Payer: BC Managed Care – PPO | Admitting: Gastroenterology

## 2020-06-12 ENCOUNTER — Encounter: Payer: Self-pay | Admitting: Gastroenterology

## 2020-06-12 ENCOUNTER — Ambulatory Visit (INDEPENDENT_AMBULATORY_CARE_PROVIDER_SITE_OTHER): Payer: BC Managed Care – PPO | Admitting: Gastroenterology

## 2020-06-12 VITALS — BP 130/78 | HR 70 | Ht 61.5 in | Wt 130.1 lb

## 2020-06-12 DIAGNOSIS — Z86010 Personal history of colonic polyps: Secondary | ICD-10-CM | POA: Diagnosis not present

## 2020-06-12 DIAGNOSIS — R1084 Generalized abdominal pain: Secondary | ICD-10-CM

## 2020-06-12 DIAGNOSIS — K582 Mixed irritable bowel syndrome: Secondary | ICD-10-CM

## 2020-06-12 DIAGNOSIS — R17 Unspecified jaundice: Secondary | ICD-10-CM

## 2020-06-12 DIAGNOSIS — K219 Gastro-esophageal reflux disease without esophagitis: Secondary | ICD-10-CM

## 2020-06-12 MED ORDER — DICYCLOMINE HCL 10 MG PO CAPS: 10.0000 mg | ORAL_CAPSULE | Freq: Four times a day (QID) | ORAL | 3 refills | Status: DC | PRN

## 2020-06-12 NOTE — Progress Notes (Signed)
P  Chief Complaint:    Procedure follow-up, constipation, GERD  GI History: 45 year old female with a history of hepatic steatosis, anxiety, headaches, depression, GERD, hyperlipidemia, chronic constipation, hypothyroidism, follows in the GI clinic for the following:  1) GERD: History of enamel erosion but no typical reflux symptoms until 02/2020 (heartburn, upper abdominal pain, early satiety) -02/2020: Normal CBC, CMP.  H. pylori breath test negative.  Started on Pepcid -03/2020: Changed Pepcid to omeprazole, discontinued all NSAIDs -04/2020: EGD: 2 cm tongue of salmon-colored mucosa (path negative for Barrett's), Hill grade 1, minimal non-H. pylori gastritis, normal duodenum  2) Chronic constipation -Well-controlled with OTC magnesium supplements  Endoscopic History: -EGD (04/2020): 2 cm tongue of salmon-colored mucosa (path negative for Barrett's),; mucosa, Hill grade 1, minimal non-H. pylori gastritis, normal duodenum -Colonoscopy (04/2020): 1 SSP, 1 traditional serrated adenoma, 3 benign polyps.  Sigmoid/cecal diverticulosis, grade 2 internal hemorrhoids.  Repeat in 3 years  HPI:     Patient is a 45 y.o. female presenting to the Gastroenterology Clinic for follow-up.  She states she overall feels better, but multiple issues/questions today.  Still with reduced appetite. Just eats a few bites.  States that she stopped eating as she is concerned that any further eating and "the acid starts".  Not sure if this is truly early satiety.  No specific postprandial abdominal pain/sitophobia per se.  Symptoms worse with stressful situations.  Supplements p.o. intake with protein shakes. Weight stable. Still taking omeprazole 20 mg BID.   Does have intermittent episodes of generalized abdominal pain.  Points to periumbilical area, RUQ, MEG.  Symptoms occur seemingly at random.  Stools more on the loose side now, but in the setting of taking multiple OTC supplements for "upset stomach".  Started taking fiber supplement.    Labs last month notable for mildly elevated iron sat (46%) with otherwise normal iron indices, normal B12, vitamin D, CBC.  T bili 1.6, otherwise normal CMP.  No fractionization for review.  Review of systems:     No chest pain, no SOB, no fevers, no urinary sx   Past Medical History:  Diagnosis Date  . Allergy   . Anxiety   . Arthritis   . Diverticulosis   . Gastritis   . Hyperlipidemia 05/12/2018  . Major depressive disorder, 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[place]) 04/15/2016  . 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 Sciatica of right side 07/27/2019  . Thyroid disease     Patient's surgical history, family medical history, social history, medications and allergies were all reviewed in Epic    Current Outpatient Medications  Medication Sig Dispense Refill  . acetaminophen (TYLENOL) 500 MG tablet Take 500 mg by mouth every 6 (six) hours as needed. Takes 0-6 per day depending on her pain level (Patient not taking: Reported on 05/18/2020)    . ALPRAZolam (XANAX) 0.5 MG tablet Take 1 tab up to three times a day if needed for severe anxiety. Avoid taking daily, try to limit to <5 days/week. 30 tablet 0  . aluminum-magnesium hydroxide-simethicone (MAALOX) 329-518-84 MG/5ML SUSP Take 20 mLs by mouth 3 (three) times daily.    . Ca Carbonate-Mag Hydroxide (ROLAIDS PO) Take 1 each by mouth as needed.    . DULoxetine (CYMBALTA) 30 MG capsule Take 3 capsules (90 mg total) by mouth daily. 90 capsule 5  . gabapentin (NEURONTIN) 300 MG capsule Take 300 mg by mouth as needed.    . hydrOXYzine (ATARAX/VISTARIL) 50 MG tablet Take 1-2 tablets (50-100 mg total)  by mouth every 6 (six) hours as needed for anxiety. 120 tablet 0  . levothyroxine (SYNTHROID) 75 MCG tablet TAKE 1 TABLET DAILY ON EMPTY STOMACH WITH ONLY WATER FOR 30 MINUTES & NO ANTACID MEDS, CALCIUM OR MAGNESIUM FOR 4 HOURS AND AVOID BIOTIN 90 tablet 3  . MAGNESIUM PO Take 400 mg by mouth daily.     . Multiple Vitamins-Minerals (MULTIVITAMIN ADULTS PO) Take 1 tablet by mouth daily.    [name] 3-6-9 Fatty Acids (OMEGA-3-6-9 PO) Take 1 Dose by mouth daily.    Marland Kitchen omeprazole (PRILOSEC) 20 MG capsule Take 1 capsule (20 mg total) by mouth 2 (two) times daily before a meal. 60 capsule 2  . Valerian 100 MG CAPS OTC taking PRN anxiety 840 capsule 0  . VITAMIN D PO Take 10,000 Units by mouth daily.     No current facility-administered medications for this visit.    Physical Exam:     Ht 5' 1.5" (1.562 m)   Wt 130 lb 2 oz (59 kg)   BMI 24.19 kg/m   GENERAL:  Pleasant female in NAD PSYCH: : Cooperative, normal affect ABDOMEN:  Nondistended, soft, nontender. No obvious masses, no hepatomegaly,  normal bowel sounds SKIN:  turgor, no lesions seen Musculoskeletal:  Normal muscle tone, normal strength NEURO: Alert and oriented x 3, no focal neurologic deficits   IMPRESSION and PLAN:    1) Decreased appetite 2) GERD 3) IBS 4) Change in bowel habits 5) Generalized abdominal pain -Has completed adequate course of bid PPI.  Trial reducing omeprazole to daily for continued control of reflux symptoms -We will long conversation regarding her GI symptomatology and suspected hypersensitivity or functional syndrome.  She is currently on an SNRI, which can certainly help with esophageal hypersensitivity.  Discussed how addition of TCA could be beneficial in IBS, who would only do in concert with her Neurologist (Dr. Felecia Shelling) and [facility] (Dr. Melford Aase) -In the meantime, trial course of Bentyl -If no significant clinical improvement, plan for cross-sectional imaging with CT abdomen/pelvis  6) History of colon polyps -Repeat colonoscopy 2025 for ongoing polyp surveillance  7) Elevated T bili -Elevated T bili with otherwise normal liver enzymes and ALP -Patient would like to repeat labs with her PCM.  Recommend hepatic function panel with bilirubin fractionization (indirect and direct bilirubin)  RTC in 6  months or sooner as needed  I spent 35 minutes of time, including in depth chart review, independent review of results as outlined above, communicating results with the patient directly, face-to-face time with the patient, coordinating care, and ordering studies and medications as appropriate, and documentation.           [place] ,DO, FACG 06/12/2020, 9:02 AM

## 2020-06-12 NOTE — Patient Instructions (Addendum)
If you are age 46 or older, your body mass index should be between 23-30. Your Body mass index is 24.19 kg/m. If this is out of the aforementioned range listed, please consider follow up with your Primary Care Provider.  If you are age 36 or younger, your body mass index should be between 19-25. Your Body mass index is 24.19 kg/m. If this is out of the aformentioned range listed, please consider follow up with your Primary Care Provider.       Fiber Chart  You should be consuming 25-30g of fiber per day and drinking 8 glasses of water to help your bowels move regularly.  In the chart below you can look up how much fiber you are getting in an average day.  If you are not getting enough fiber, you should add a fiber supplement to your diet.  Examples of this include Metamucil, FiberCon and Citrucel.  These can be purchased at your local grocery store or pharmacy.      [email or web address]  We have sent the following medications to your pharmacy for you to pick up at your convenience:  Bentyl 10mg    Due to recent changes in healthcare laws, you may see the results of your imaging and laboratory studies on MyChart before your provider has had a chance to review them.  We understand that in some cases there may be results that are confusing or concerning to you. Not all laboratory results come back in the same time frame and the provider may be waiting for multiple results in order to interpret others.  Please give [place] 48 hours in order for your provider to thoroughly review all the results before contacting the office for clarification of your results.   Thank you for choosing me and [facility].  Vito Cirigliano, D.O.

## 2020-06-19 DIAGNOSIS — F419 Anxiety disorder, unspecified: Secondary | ICD-10-CM | POA: Diagnosis not present

## 2020-07-02 DIAGNOSIS — F419 Anxiety disorder, unspecified: Secondary | ICD-10-CM | POA: Diagnosis not present

## 2020-07-03 DIAGNOSIS — R1013 Epigastric pain: Secondary | ICD-10-CM

## 2020-07-13 DIAGNOSIS — M9901 Segmental and somatic dysfunction of cervical region: Secondary | ICD-10-CM | POA: Diagnosis not present

## 2020-07-13 DIAGNOSIS — M9902 Segmental and somatic dysfunction of thoracic region: Secondary | ICD-10-CM | POA: Diagnosis not present

## 2020-07-13 DIAGNOSIS — M5013 Cervical disc disorder with radiculopathy, cervicothoracic region: Secondary | ICD-10-CM | POA: Diagnosis not present

## 2020-07-13 DIAGNOSIS — M545 Low back pain, unspecified: Secondary | ICD-10-CM | POA: Diagnosis not present

## 2020-07-13 DIAGNOSIS — M9904 Segmental and somatic dysfunction of sacral region: Secondary | ICD-10-CM | POA: Diagnosis not present

## 2020-07-13 DIAGNOSIS — M9903 Segmental and somatic dysfunction of lumbar region: Secondary | ICD-10-CM | POA: Diagnosis not present

## 2020-07-16 DIAGNOSIS — F419 Anxiety disorder, unspecified: Secondary | ICD-10-CM | POA: Diagnosis not present

## 2020-07-17 MED ORDER — OMEPRAZOLE 20 MG PO CPDR: 20.0000 mg | DELAYED_RELEASE_CAPSULE | Freq: Two times a day (BID) | ORAL | 2 refills | Status: DC

## 2020-07-20 DIAGNOSIS — M545 Low back pain, unspecified: Secondary | ICD-10-CM | POA: Diagnosis not present

## 2020-07-20 DIAGNOSIS — M5013 Cervical disc disorder with radiculopathy, cervicothoracic region: Secondary | ICD-10-CM | POA: Diagnosis not present

## 2020-07-30 DIAGNOSIS — Z01419 Encounter for gynecological examination (general) (routine) without abnormal findings: Secondary | ICD-10-CM | POA: Diagnosis not present

## 2020-08-02 DIAGNOSIS — M5013 Cervical disc disorder with radiculopathy, cervicothoracic region: Secondary | ICD-10-CM | POA: Diagnosis not present

## 2020-08-02 DIAGNOSIS — M545 Low back pain, unspecified: Secondary | ICD-10-CM | POA: Diagnosis not present

## 2020-08-03 ENCOUNTER — Other Ambulatory Visit: Payer: Self-pay | Admitting: Neurology

## 2020-08-06 ENCOUNTER — Other Ambulatory Visit: Payer: Self-pay | Admitting: Adult Health

## 2020-08-06 NOTE — Progress Notes (Signed)
Appointment Cancelled

## 2020-08-07 ENCOUNTER — Other Ambulatory Visit: Payer: BC Managed Care – PPO

## 2020-08-07 ENCOUNTER — Other Ambulatory Visit: Payer: Self-pay

## 2020-08-07 ENCOUNTER — Ambulatory Visit: Payer: BC Managed Care – PPO | Admitting: Internal Medicine

## 2020-08-07 DIAGNOSIS — R233 Spontaneous ecchymoses: Secondary | ICD-10-CM | POA: Diagnosis not present

## 2020-08-08 LAB — COMPLETE METABOLIC PANEL WITH GFR
AG Ratio: 2.2 (calc) (ref 1.0–2.5)
ALT: 23 U/L (ref 6–29)
AST: 24 U/L (ref 10–35)
Albumin: 4.6 g/dL (ref 3.6–5.1)
Alkaline phosphatase (APISO): 64 U/L (ref 31–125)
BUN: 13 mg/dL (ref 7–25)
CO2: 29 mmol/L (ref 20–32)
Calcium: 9.4 mg/dL (ref 8.6–10.2)
Chloride: 97 mmol/L — ABNORMAL LOW (ref 98–110)
Creat: 0.76 mg/dL (ref 0.50–1.10)
GFR, Est African American: 109 mL/min/{1.73_m2} (ref >=60)
GFR, Est Non African American: 94 mL/min/{1.73_m2} (ref >=60)
Globulin: 2.1 g/dL (calc) (ref 1.9–3.7)
Glucose, Bld: 96 mg/dL (ref 65–99)
Potassium: 3.8 mmol/L (ref 3.5–5.3)
Sodium: 134 mmol/L — ABNORMAL LOW (ref 135–146)
Total Bilirubin: 0.9 mg/dL (ref 0.2–1.2)
Total Protein: 6.7 g/dL (ref 6.1–8.1)

## 2020-08-08 LAB — CBC WITH DIFFERENTIAL/PLATELET
Absolute Monocytes: 612 cells/uL (ref 200–950)
Basophils Absolute: 61 cells/uL (ref 0–200)
Basophils Relative: 0.9 %
Eosinophils Absolute: 197 cells/uL (ref 15–500)
Eosinophils Relative: 2.9 %
HCT: 39.4 % (ref 35.0–45.0)
Hemoglobin: 13.3 g/dL (ref 11.7–15.5)
Lymphs Abs: 1986 cells/uL (ref 850–3900)
MCH: 32.6 pg (ref 27.0–33.0)
MCHC: 33.8 g/dL (ref 32.0–36.0)
MCV: 96.6 fL (ref 80.0–100.0)
MPV: 9.1 fL (ref 7.5–12.5)
Monocytes Relative: 9 %
Neutro Abs: 3944 cells/uL (ref 1500–7800)
Neutrophils Relative %: 58 %
Platelets: 329 10*3/uL (ref 140–400)
RBC: 4.08 10*6/uL (ref 3.80–5.10)
RDW: 11.9 % (ref 11.0–15.0)
Total Lymphocyte: 29.2 %
WBC: 6.8 10*3/uL (ref 3.8–10.8)

## 2020-08-08 LAB — BILIRUBIN, FRACTIONATED(TOT/DIR/INDIR)
Bilirubin, Direct: 0.2 mg/dL (ref 0.0–0.2)
Indirect Bilirubin: 0.7 mg/dL (calc) (ref 0.2–1.2)
Total Bilirubin: 0.9 mg/dL (ref 0.2–1.2)

## 2020-08-08 LAB — PROTIME-INR
INR: 1
Prothrombin Time: 10.2 s (ref 9.0–11.5)

## 2020-08-08 NOTE — Progress Notes (Signed)
============================================================ -   Test results slightly outside the reference range are not unusual. If there is anything important, I will review this with you,  otherwise it is considered normal test values.  If you have further questions,  please do not hesitate to contact me at the office or via My Chart.  ============================================================ ============================================================  -  CBC is normal - Normal platelet clotting factors and                                                                  Protime clotting test is also Normal  ============================================================ ============================================================  - CMET is 100 % Normal & OK - Bilirubin test is Normal   All Else - Kidneys - Electrolytes - all  Normal / OK ============================================================

## 2020-08-13 DIAGNOSIS — F419 Anxiety disorder, unspecified: Secondary | ICD-10-CM | POA: Diagnosis not present

## 2020-08-17 ENCOUNTER — Other Ambulatory Visit: Payer: Self-pay | Admitting: Neurology

## 2020-08-17 ENCOUNTER — Telehealth: Payer: Self-pay | Admitting: Gastroenterology

## 2020-08-17 ENCOUNTER — Other Ambulatory Visit: Payer: Self-pay | Admitting: Adult Health

## 2020-08-17 ENCOUNTER — Other Ambulatory Visit: Payer: BC Managed Care – PPO

## 2020-08-17 DIAGNOSIS — M5013 Cervical disc disorder with radiculopathy, cervicothoracic region: Secondary | ICD-10-CM | POA: Diagnosis not present

## 2020-08-17 DIAGNOSIS — K582 Mixed irritable bowel syndrome: Secondary | ICD-10-CM

## 2020-08-17 DIAGNOSIS — M545 Low back pain, unspecified: Secondary | ICD-10-CM | POA: Diagnosis not present

## 2020-08-17 DIAGNOSIS — R197 Diarrhea, unspecified: Secondary | ICD-10-CM

## 2020-08-17 MED ORDER — DICYCLOMINE HCL 10 MG PO CAPS: 10.0000 mg | ORAL_CAPSULE | Freq: Three times a day (TID) | ORAL | 2 refills | Status: DC

## 2020-08-17 MED ORDER — TEMAZEPAM 30 MG PO CAPS: 30.0000 mg | ORAL_CAPSULE | ORAL | 0 refills | Status: DC | PRN

## 2020-08-17 NOTE — Telephone Encounter (Signed)
She had previously endorsed chronic constipation which had improved at the time of our last appointment.  Did have some loose stools but that was in the context of OTC constipation medications.  Based on this change that she describes, I recommend the following: -Check GI PCR panel, fecal calprotectin - Okay to use dicyclomine as currently doing - Continue adequate hydration as currently doing - Add fiber supplement for stool bulking effect - If GI PCR panel and calprotectin both negative, can consider changing Imodium to Lomotil

## 2020-08-17 NOTE — Telephone Encounter (Signed)
Spoke with patient in regards to recommendations. Patient will come to the [place] office on Tuesday to pick up stool kits. Patient advised to begin a fiber supplement. She is aware that if stools studies are negative we may send in additional prescription. Patient verbalized understanding and had no concerns at the end of the call.

## 2020-08-17 NOTE — Telephone Encounter (Signed)
Spoke with patient, she states that she had bouts of diarrhea that started at the end of January. More recently diarrhea has picked up in intensity. Patient states that she tried Imodium but she would still have diarrhea after taking the max amount of Imodium in a day. Patient states that she started taking the Dicyclomine to see if that would help with the diarrhea. She states that on Wednesday, she took 1 in the morning and 1 in the evening, did fine. She took 3 dicyclomine yesterday, still had a little of diarrhea. Patient describes as "watery diarrhea", she states that it has been causing her muscles to get crampy. Patient states that she is drinking Pedialyte to stay hydrated. Patient wanted to know if Omeprazole could be refilled, advised that it was refilled back in April and she should check with the pharmacy. Please advise, thanks.

## 2020-08-17 NOTE — Progress Notes (Signed)
Future Appointments  Date Time Provider [place]  10/22/2020  8:20 AM Lavena Bullion, DO [facility]  10/29/2020  7:30 AM Debbora Presto, NP [facility] None  11/21/2020  9:30 AM Liane Comber, NP [facility] None  05/20/2021  9:00 AM Liane Comber, NP [facility] None    PDMP reviewed for temazepam refill request.

## 2020-08-17 NOTE — Telephone Encounter (Signed)
Inbound call from patient requesting a call from a nurse please.  Has questions but did not specify further.

## 2020-08-21 ENCOUNTER — Telehealth: Payer: Self-pay | Admitting: Emergency Medicine

## 2020-08-21 ENCOUNTER — Other Ambulatory Visit: Payer: Self-pay | Admitting: Emergency Medicine

## 2020-08-21 ENCOUNTER — Other Ambulatory Visit: Payer: BC Managed Care – PPO

## 2020-08-21 DIAGNOSIS — K582 Mixed irritable bowel syndrome: Secondary | ICD-10-CM

## 2020-08-21 DIAGNOSIS — R197 Diarrhea, unspecified: Secondary | ICD-10-CM

## 2020-08-21 MED ORDER — DULOXETINE HCL 60 MG PO CPEP: 60.0000 mg | ORAL_CAPSULE | Freq: Every day | ORAL | 5 refills | Status: DC

## 2020-08-21 MED ORDER — DULOXETINE HCL 30 MG PO CPEP: 30.0000 mg | ORAL_CAPSULE | Freq: Every day | ORAL | 5 refills | Status: DC

## 2020-08-21 NOTE — Telephone Encounter (Signed)
Called patient and informed her Dr. Felecia Shelling sent in a prescription of 30 mg and 60 mg for a total of 90 mg to be taken daily.  That should satisfy her insurance company capsule limit of 2 daily.  She will call back if she has any trouble picking up her medications.

## 2020-08-23 DIAGNOSIS — M545 Low back pain, unspecified: Secondary | ICD-10-CM | POA: Diagnosis not present

## 2020-08-23 DIAGNOSIS — M5013 Cervical disc disorder with radiculopathy, cervicothoracic region: Secondary | ICD-10-CM | POA: Diagnosis not present

## 2020-08-27 LAB — GI PROFILE, STOOL, PCR

## 2020-08-27 LAB — CALPROTECTIN, FECAL: Calprotectin, Fecal: 20 ug/g (ref 0–120)

## 2020-09-05 ENCOUNTER — Ambulatory Visit: Payer: Self-pay | Admitting: Family Medicine

## 2020-10-22 ENCOUNTER — Other Ambulatory Visit: Payer: Self-pay

## 2020-10-22 ENCOUNTER — Ambulatory Visit (INDEPENDENT_AMBULATORY_CARE_PROVIDER_SITE_OTHER): Payer: BC Managed Care – PPO | Admitting: Gastroenterology

## 2020-10-22 ENCOUNTER — Telehealth: Payer: Self-pay | Admitting: General Surgery

## 2020-10-22 ENCOUNTER — Encounter: Payer: Self-pay | Admitting: Gastroenterology

## 2020-10-22 VITALS — BP 126/76 | HR 73 | Ht 61.5 in | Wt 141.2 lb

## 2020-10-22 DIAGNOSIS — K219 Gastro-esophageal reflux disease without esophagitis: Secondary | ICD-10-CM | POA: Diagnosis not present

## 2020-10-22 DIAGNOSIS — R1010 Upper abdominal pain, unspecified: Secondary | ICD-10-CM

## 2020-10-22 DIAGNOSIS — R197 Diarrhea, unspecified: Secondary | ICD-10-CM

## 2020-10-22 DIAGNOSIS — R1013 Epigastric pain: Secondary | ICD-10-CM | POA: Diagnosis not present

## 2020-10-22 MED ORDER — AMBULATORY NON FORMULARY MEDICATION: 0 refills | Status: DC

## 2020-10-22 MED ORDER — PANTOPRAZOLE SODIUM 40 MG PO TBEC: 40.0000 mg | DELAYED_RELEASE_TABLET | Freq: Every day | ORAL | 3 refills | Status: DC

## 2020-10-22 NOTE — Progress Notes (Signed)
Chief Complaint:    Diarrhea  GI History: 45 year old female with a history of hepatic steatosis, anxiety, headaches, depression, GERD, hyperlipidemia, chronic constipation, hypothyroidism, follows in the GI clinic for the following:   1) GERD: History of enamel erosion but no typical reflux symptoms until 02/2020 (heartburn, upper abdominal pain, early satiety) -02/2020: Normal CBC, CMP.  H. pylori breath test negative.  Started on Pepcid -03/2020: Changed Pepcid to omeprazole, discontinued all NSAIDs -04/2020: EGD: 2 cm tongue of salmon-colored mucosa (path negative for Barrett's), Hill grade 1, minimal non-H. pylori gastritis, normal duodenum   2) Chronic constipation - Previously well-controlled with OTC magnesium supplements   Endoscopic History: -EGD (04/2020): 2 cm tongue of salmon-colored mucosa (path negative for Barrett's),; mucosa, Hill grade 1, minimal non-H. pylori gastritis, normal duodenum -Colonoscopy (04/2020): 1 SSP, 1 traditional serrated adenoma, 3 benign polyps.  Sigmoid/cecal diverticulosis, grade 2 internal hemorrhoids.  Repeat in 3 years  HPI:     Patient is a 45 y.o. female presenting to the Gastroenterology Clinic for follow-up.  Last seen by me on 06/12/2020.  Main issue at that time was decreased appetite, IBS with generalized abdominal pain and looser stools (despite history of chronic constipation).  Discussed functional GI syndromes at length; was already taking SNRI.  Today, main issue is diarrhea and upper abdominal pain.  She started taking Pepto-Bismol and psyllium and starting to feel better ("90-95% better"). She als started taking OTC Digestive Enzymes which she thinks lead to the most improvement.   Labs from 07/2020 reviewed: Normal/negative GI PCR panel, fecal calprotectin.  CMP had returned to normal with normal bilirubin fractionization.  Normal INR, CBC.  No new abdominal imaging for review.  Reflux sxs improved but still with "stomach acid" which is  approximatley "60% better". This is moreso described as upper abdominal pain. Can occur with just water, and tends to be better with sweets (vitamin water, pedialyte, etc). Can also occur with fasting.     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 Takes 0-6 per day depending on her pain level     aluminum-magnesium hydroxide-simethicone (MAALOX) 200-200-20 MG/5ML SUSP Take 20 mLs by mouth 3 (three) times daily.     AMBULATORY NON FORMULARY MEDICATION 1 Scoop daily. Medication Name: Clear Vite-PSF (k-84) Enzyme based, multivitamin, mineral & herbal dietary supplement powder     AMBULATORY NON FORMULARY MEDICATION 1 tablet at bedtime. Medication Name: Marny Lowenstein dietary supplement with proteolytiic enzymes for the gut     AMBULATORY NON FORMULARY MEDICATION 2 tablets daily in the afternoon. Medication Name: Drenamin dietary supplement 3700 for adrenal support     AMBULATORY NON FORMULARY MEDICATION 3 tablets daily in the afternoon. Medication Name: A-F betafood dietary supplement 0825 for gallbladder     Ca Carbonate-Mag Hydroxide (ROLAIDS PO) Take 1 each  by mouth as needed.     dicyclomine (BENTYL) 10 MG capsule Take 1 capsule (10 mg total) by mouth 4 (four) times daily -  before meals and at bedtime. 120 capsule 2   DULoxetine (CYMBALTA) 30 MG capsule Take 1 capsule  (30 mg total) by mouth daily. 90 capsule 5   DULoxetine (CYMBALTA) 60 MG capsule Take 1 capsule (60 mg total) by mouth daily. 90 capsule 5   gabapentin (NEURONTIN) 300 MG capsule TAKE 3 CAPSULES(900 MG) BY MOUTH THREE TIMES DAILY AS NEEDED FOR PAIN 270 capsule 1   hydrOXYzine (ATARAX/VISTARIL) 50 MG tablet Take 1-2 tablets (50-100 mg total) by mouth every 6 (six) hours as needed for anxiety. 120 tablet 0   levothyroxine (SYNTHROID) 75 MCG tablet TAKE 1 TABLET DAILY ON EMPTY STOMACH WITH ONLY WATER FOR 30 MINUTES & NO ANTACID MEDS, CALCIUM OR MAGNESIUM FOR 4 HOURS AND AVOID BIOTIN 90 tablet 3   MAGNESIUM PO Take 400 mg by mouth daily.     Multiple Vitamins-Minerals (MULTIVITAMIN ADULTS PO) Take 1 tablet by mouth daily.     Omega 3-6-9 Fatty Acids (OMEGA-3-6-9 PO) Take 1 Dose by mouth daily.     omeprazole (PRILOSEC) 20 MG capsule Take 1 capsule (20 mg total) by mouth 2 (two) times daily before a meal. (Patient taking differently: Take 20 mg by mouth daily as needed.) 60 capsule 2   temazepam (RESTORIL) 30 MG capsule Take 1 capsule (30 mg total) by mouth as needed for sleep. 30 capsule 0   Valerian 100 MG CAPS OTC taking PRN anxiety 840 capsule 0   VITAMIN D PO Take 10,000 Units by mouth daily.     No current facility-administered medications for this visit.    Physical Exam:     BP 126/76   Pulse 73   Ht 5' 1.5" (1.562 m)   Wt 141 lb 4 oz (64.1 kg)   SpO2 98%   BMI 26.26 kg/m   GENERAL:  Pleasant female in NAD PSYCH: : Cooperative, normal affect Musculoskeletal:  Normal muscle tone, normal strength NEURO: Alert and oriented x 3, no focal neurologic deficits   IMPRESSION and PLAN:    1) Diarrhea 2) Upper abdominal pain - CT abdomen/pelvis given persistent symptoms - If work-up unrevealing and no clinical improvement, consider HIDA scan - Similarly, consider checking pancreatic fecal elastase.  Otherwise stool studies have been negative/unrevealing - Continue fiber  supplement  3) GERD 4) Dyspepsia - Trial changing omperazole to Protonix 40 mg/day and titrate to effect -Patient requesting Rx for GI cocktail in case symptoms exacerbate - Continue antireflux lifestyle/dietary modifications - Can also trial course of FD guard  I spent 30 minutes of time, including in depth chart review, independent review of results as outlined above, communicating results with the patient directly, face-to-face time with the patient, coordinating care, and ordering studies and medications as appropriate, and documentation.   Lavena Bullion ,DO, FACG 10/22/2020, 8:47 AM

## 2020-10-22 NOTE — Patient Instructions (Addendum)
If you are age 46 or older, your body mass index should be between 23-30. Your Body mass index is 26.26 kg/m. If this is out of the aforementioned range listed, please consider follow up with your Primary Care Provider.  If you are age 80 or younger, your body mass index should be between 19-25. Your Body mass index is 26.26 kg/m. If this is out of the aformentioned range listed, please consider follow up with your Primary Care Provider.   __________________________________________________________  The [place] [facility] providers would like to encourage you to use [facility] to communicate with providers for non-urgent requests or questions.  Due to long hold times on the telephone, sending your provider a message by [facility] may be a faster and more efficient way to get a response.  Please allow 48 business hours for a response.  Please remember that this is for non-urgent requests.  __________________________________________________________ [facility]'s information is below: Address: [address] [place], [place] [address]  [phone]  *Please DO NOT go directly from our office to pick up this medication! Give the pharmacy 1 day to process the prescription as this is compounded and takes time to make. _________________________________________________________ We have sent the following medications to your pharmacy for you to pick up at your convenience:  Protonix 40 mg daily  Please discontinue your prilosec.   Return to the clinic in 6 months  Thank you for choosing me and [facility].  Gerrit Heck, D.O.  You will hear from [facility] imaging regarding you CT scan

## 2020-10-22 NOTE — Telephone Encounter (Signed)
Sent mychart message

## 2020-10-23 DIAGNOSIS — M545 Low back pain, unspecified: Secondary | ICD-10-CM | POA: Diagnosis not present

## 2020-10-23 DIAGNOSIS — M5013 Cervical disc disorder with radiculopathy, cervicothoracic region: Secondary | ICD-10-CM | POA: Diagnosis not present

## 2020-10-25 NOTE — Patient Instructions (Signed)
Below is our plan:  We will continue gabapentin '300mg'$  capsules up to a total dose of '2700mg'$ . We will increase duloxetine to '60mg'$  twice daily to see if this helps. Continue to be active.   Please make sure you are staying well hydrated. I recommend 50-60 ounces daily. Well balanced diet and regular exercise encouraged. Consistent sleep schedule with 6-8 hours recommended.   Please continue follow up with care team as directed.   Follow up with Dr Felecia Shelling  in 6 months   You may receive a survey regarding today's visit. I encourage you to leave honest feed back as I do use this information to improve patient care. Thank you for seeing me today!

## 2020-10-25 NOTE — Progress Notes (Addendum)
Chief Complaint  Patient presents with   Follow-up    Rm 1, alone. Pt reports being stable.      HISTORY OF PRESENT ILLNESS: 10/29/20 ALL:  Charlotte Wise is a 45 y.o. female here today for follow up for right leg pain. She stopped imipramine over a year ago as it was ineffective. She was started on duloxetine '60mg'$  daily. Dose was increased to '90mg'$  but she is not sure it helps. She continues gabapentin. She feels that she does better taking '300mg'$  six times a day. EMG/NCS showed possible right C7 radiculopathy. No lumbar radiculopathy. She is taking Tylenol and ibuprofen throughout the day. She continues to struggle with daily pain. She feels that her both arms hurt and are inflamed with prolonged activity using a computer or reading a book. No specific areas of numbness or weakness. Pain is more generalized. Rarely has pain in lower extremities. She continues to see PT for dry needling. Feels that this helps with muscle tension. She is doing PT exercises at home but is not sure it helps.   HISTORY (copied from Dr Garth Bigness previous note)  Charlotte Wise is a 45 year old woman who has had numbness and pain since March 2021.       Update 10/19/2019: She continues to report pain.  Pain had been mostly in the right leg with the worse pain in the medial right lower leg and tingling in the outer lower leg and foot.   Since a long car ride, pain has increased.  MRI lumbar just showed mild bulging at L5-S1 but no nerve root compression.    She is doing therapy and has been given some exercises to do.  Charlotte Wise also is doing a laser (class 4) therapy and feels better with that for a while after each session.     We had started imipramine 25 mg once a night, she increased to a few a day and felt leg pain was better but her headaches.   She is also on gabapentin 900 mg po tid.     She also has some neck pain.   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   There was foraminal narrowing at these levels.  Could affect right C7 nerve root.   History of back and leg pain pain: She was moving mulch with a large bucket (multiple trips) 06/02/2019 and she felt very tired later that day.  She was so tired the next 5 days that she was sleeping more.   She started to have right leg >> arm pain about 5 days later.   A few days later (06/11/19) she started to experience total body pain, right > left.  The leg was most involved but she also noted symptoms in her flank.  Arms were involved to a lesser extent.   She also noted numbness in her neck and face.  She also noted spasms in her leg.  These symptoms were constant for a couple weeks.   She did not note weakness.    She noted no change in gait.  Bladder function was unchanged.   After the pain and numbness was constant for a while it changed to become more intermittent a few weeks ago.   However, she had more neck and back pain as the numbness improved.   Currently, she has neck pain and pain and numbness into her legs when she sits a long time.  The facial numbness is intermittent.  Neck pain (burning quality) increases with leaning forward and doing exercises.  Pain also increased with sleeping on her side.   Gabapentin has helped the pain some.    Pain in her back is worse with prolonged sitting and better with getting up and moving.  .     She also notes reduced memory.   She often needs to take notes more to remember to do task at work.   Sometimes she has trouble recounting her day.   This has been going on for at least 20 years.   She does not feel it has drastically changed the past year.       She has not had any imaging studies.   She is fairly healthy except for hypothyroidism and depression.  She is active with walking, yard work.   She occasionally does light yoga.    She uses an inversion table.      Her mother has MS.   REVIEW OF SYSTEMS: Out of a complete 14 system review of symptoms, the patient  complains only of the following symptoms, bilateral upper extremity pain, muscle tension, and all other reviewed systems are negative.   ALLERGIES: Allergies  Allergen Reactions   Hydrocodone Itching   Oxycodone-Acetaminophen    Penicillins Swelling     HOME MEDICATIONS: Outpatient Medications Prior to Visit  Medication Sig Dispense Refill   acetaminophen (TYLENOL) 500 MG tablet Take 500 mg by mouth every 6 (six) hours as needed. Takes 0-6 per day depending on her pain level     aluminum-magnesium hydroxide-simethicone (MAALOX) 200-200-20 MG/5ML SUSP Take 20 mLs by mouth 3 (three) times daily.     AMBULATORY NON FORMULARY MEDICATION 1 Scoop daily. Medication Name: Clear Vite-PSF (k-84) Enzyme based, multivitamin, mineral & herbal dietary supplement powder     AMBULATORY NON FORMULARY MEDICATION 1 tablet at bedtime. Medication Name: Marny Lowenstein dietary supplement with proteolytiic enzymes for the gut     AMBULATORY NON FORMULARY MEDICATION 2 tablets daily in the afternoon. Medication Name: Drenamin dietary supplement 3700 for adrenal support     AMBULATORY NON FORMULARY MEDICATION 3 tablets daily in the afternoon. Medication Name: A-F betafood dietary supplement 0825 for gallbladder     AMBULATORY NON FORMULARY MEDICATION 90 ml of lidocaine 2% 90 ml dicyclomine 10/mg per 63m 270 ml of maalox  Take 5 ml every 12 hours as needed 450 mL 0   Bismuth Subsalicylate (PEPTO-BISMOL PO) Take by mouth as needed.     Ca Carbonate-Mag Hydroxide (ROLAIDS PO) Take 1 each by mouth as needed.     dicyclomine (BENTYL) 10 MG capsule Take 1 capsule (10 mg total) by mouth 4 (four) times daily -  before meals and at bedtime. 120 capsule 2   gabapentin (NEURONTIN) 300 MG capsule TAKE 3 CAPSULES(900 MG) BY MOUTH THREE TIMES DAILY AS NEEDED FOR PAIN 270 capsule 1   hydrOXYzine (ATARAX/VISTARIL) 50 MG tablet Take 1-2 tablets (50-100 mg total) by mouth every 6 (six) hours as needed for anxiety. 120 tablet 0    ibuprofen (ADVIL) 200 MG tablet Take 200 mg by mouth every 6 (six) hours as needed.     levothyroxine (SYNTHROID) 75 MCG tablet TAKE 1 TABLET DAILY ON EMPTY STOMACH WITH ONLY WATER FOR 30 MINUTES & NO ANTACID MEDS, CALCIUM OR MAGNESIUM FOR 4 HOURS AND AVOID BIOTIN 90 tablet 3   MAGNESIUM PO Take 400 mg by mouth daily.     Multiple Vitamins-Minerals (MULTIVITAMIN ADULTS PO)  Take 1 tablet by mouth daily.     Omega 3-6-9 Fatty Acids (OMEGA-3-6-9 PO) Take 1 Dose by mouth daily.     pantoprazole (PROTONIX) 40 MG tablet Take 1 tablet (40 mg total) by mouth daily. 90 tablet 3   psyllium (METAMUCIL) 58.6 % powder Take 1 packet by mouth daily as needed.     temazepam (RESTORIL) 30 MG capsule Take 1 capsule (30 mg total) by mouth as needed for sleep. 30 capsule 0   Valerian 100 MG CAPS OTC taking PRN anxiety 840 capsule 0   VITAMIN D PO Take 10,000 Units by mouth daily.     DULoxetine (CYMBALTA) 30 MG capsule Take 1 capsule (30 mg total) by mouth daily. 90 capsule 5   DULoxetine (CYMBALTA) 60 MG capsule Take 1 capsule (60 mg total) by mouth daily. 90 capsule 5   No facility-administered medications prior to visit.     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   Sciatica of right side 07/27/2019   Thyroid disease      PAST SURGICAL HISTORY: Past Surgical History:  Procedure Laterality Date   ANKLE ARTHROSCOPY Right 2000   COLONOSCOPY  05/01/2020   KNEE ARTHROSCOPY Left 1988   KNEE ARTHROSCOPY Right 1999   LAPAROSCOPY  2001   NASAL POLYP SURGERY  2006   TONSILLECTOMY AND ADENOIDECTOMY  1981   UPPER GASTROINTESTINAL ENDOSCOPY  05/01/2020     FAMILY HISTORY: Family History  Problem Relation Age of Onset   Alcohol abuse Father    Drug abuse Father     Thyroid cancer Maternal Grandmother        With mets to bone   Heart disease Paternal Grandfather    Stroke Paternal Grandfather    Clotting disorder Mother        lupus antibody    Alcohol abuse Mother    Drug abuse Mother    Other Mother        "esophageal erosion"   Kidney disease Maternal Grandfather    Drug abuse Brother    Alcohol abuse Brother    Heart disease Brother    Colon cancer Neg Hx    Esophageal cancer Neg Hx    Pancreatic cancer Neg Hx    Liver disease Neg Hx    Stomach cancer Neg Hx      SOCIAL HISTORY: Social History   Socioeconomic History   Marital status: Married    Spouse name: Debroah Baller   Number of children: 1   Years of education: BS   Highest education level: Not on file  Occupational History   Occupation: not currently working   Tobacco Use   Smoking status: Never   Smokeless tobacco: Never   Tobacco comments:    Tried a cigarette once  Vaping Use   Vaping Use: Never used  Substance and Sexual Activity   Alcohol use: Not Currently    Alcohol/week: 0.0 standard drinks    Comment: previously occasional socially    Drug use: No   Sexual activity: Yes    Partners: Male    Birth control/protection: I.U.D.  Other Topics Concern   Not on file  Social History Narrative   Lives with husband and son   Caffeine use: No soda,  a few cups of coffee or tea per month   Right handed    Social Determinants of Health   Financial Resource Strain: Not on file  Food Insecurity: Not on file  Transportation Needs: Not on file  Physical Activity: Not on file  Stress: Not on file  Social Connections: Not on file  Intimate Partner Violence: Not on file     PHYSICAL EXAM  Vitals:   10/29/20 0732  BP: 113/75  Pulse: 75  Weight: 142 lb (64.4 kg)  Height: 5' 4.5" (1.638 m)   Body mass index is 24 kg/m.   Generalized: Well developed, in no acute distress  Cardiology: normal rate and rhythm, no murmur auscultated  Respiratory: clear to  auscultation bilaterally    Neurological examination  Mentation: Alert oriented to time, place, history taking. Follows all commands speech and language fluent Cranial nerve II-XII: Pupils were equal round reactive to light. Extraocular movements were full, visual field were full on confrontational test. Facial sensation and strength were normal. Head turning and shoulder shrug  were normal and symmetric. Motor: The motor testing reveals 5 over 5 strength of all 4 extremities. Good symmetric motor tone is noted throughout.  Sensory: Sensory testing is intact to soft touch on all 4 extremities. No evidence of extinction is noted.  Coordination: Cerebellar testing reveals good finger-nose-finger and heel-to-shin bilaterally.  Gait and station: Gait is normal.  Reflexes: Deep tendon reflexes are brisk but symmetric bilaterally.    DIAGNOSTIC DATA (LABS, IMAGING, TESTING) - I reviewed patient records, labs, notes, testing and imaging myself where available.  Lab Results  Component Value Date   WBC 6.8 08/07/2020   HGB 13.3 08/07/2020   HCT 39.4 08/07/2020   MCV 96.6 08/07/2020   PLT 329 08/07/2020      Component Value Date/Time   NA 134 (L) 08/07/2020 0000   K 3.8 08/07/2020 0000   CL 97 (L) 08/07/2020 0000   CO2 29 08/07/2020 0000   GLUCOSE 96 08/07/2020 0000   BUN 13 08/07/2020 0000   CREATININE 0.76 08/07/2020 0000   CALCIUM 9.4 08/07/2020 0000   PROT 6.7 08/07/2020 0000   ALBUMIN 4.2 10/09/2016 1604   AST 24 08/07/2020 0000   ALT 23 08/07/2020 0000   ALKPHOS 55 10/09/2016 1604   BILITOT 0.9 08/07/2020 1544   GFRNONAA 94 08/07/2020 0000   GFRAA 109 08/07/2020 0000   Lab Results  Component Value Date   CHOL 165 05/18/2019   HDL 58 05/18/2019   LDLCALC 93 05/18/2019   TRIG 53 05/18/2019   CHOLHDL 2.8 05/18/2019   Lab Results  Component Value Date   HGBA1C 5.1 05/18/2019   Lab Results  Component Value Date   [id number] 05/18/2020   Lab Results  Component  Value Date   TSH 0.77 05/18/2020    No flowsheet data found.   No flowsheet data found.   ASSESSMENT AND PLAN  45 y.o. year old female  has a past medical history of Allergy, Anxiety, Arthritis, Diverticulosis, Gastritis, Hyperlipidemia (05/12/2018), Major depressive disorder, 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[place]) (04/15/2016), Schizophreniform disorder ([place]) (04/23/2016), Sciatica of right side (07/27/2019), and Thyroid disease. here with    Pain of right lower extremity  Right arm pain  Neck pain  Mid back pain  Inger continues to have generalized pain of bilateral upper extremities, neck and back, rarely has pain of lower extremities. She will continue gabapentin 1800-'2700mg'$  daily. She is unsure if duloxetine has been helpful but does  note that lower extremity pain has significantly improved. We will increase her dose to '60mg'$  BID. Workup has not identified any specific etiology for pain. Symptoms most consistent with fibromyalgia. We have discussed results of NCS/EMG. We have discussed option of changing gabapentin to Lyrica versus referral to pain management in the future. She will continue to work on healthy lifestyle habits. She will follow up in 6 months.    No orders of the defined types were placed in this encounter.    Meds ordered this encounter  Medications   DULoxetine (CYMBALTA) 60 MG capsule    Sig: Take 1 capsule (60 mg total) by mouth 2 (two) times daily.    Dispense:  180 capsule    Refill:  3    Order Specific Question:   Supervising Provider    Answer:   Melvenia Beam [id number], MSN, FNP-C 10/29/2020, 8:09 AM  [facility] [address], [place], [place] [address] [phone]    I have read the note, and I agree with the clinical assessment and plan.  Richard A. Felecia Shelling, MD, PhD, [facility] Certified in Neurology, Clinical Neurophysiology, Sleep Medicine, Pain Medicine and Neuroimaging  [facility] [address] [place], [place] [address] [phone]

## 2020-10-29 ENCOUNTER — Encounter: Payer: Self-pay | Admitting: Family Medicine

## 2020-10-29 ENCOUNTER — Ambulatory Visit: Payer: BC Managed Care – PPO | Admitting: Family Medicine

## 2020-10-29 ENCOUNTER — Telehealth: Payer: Self-pay | Admitting: Family Medicine

## 2020-10-29 VITALS — BP 113/75 | HR 75 | Ht 64.5 in | Wt 142.0 lb

## 2020-10-29 DIAGNOSIS — M79601 Pain in right arm: Secondary | ICD-10-CM | POA: Diagnosis not present

## 2020-10-29 DIAGNOSIS — M549 Dorsalgia, unspecified: Secondary | ICD-10-CM | POA: Diagnosis not present

## 2020-10-29 DIAGNOSIS — M542 Cervicalgia: Secondary | ICD-10-CM

## 2020-10-29 DIAGNOSIS — M79604 Pain in right leg: Secondary | ICD-10-CM

## 2020-10-29 MED ORDER — DULOXETINE HCL 60 MG PO CPEP: 60.0000 mg | ORAL_CAPSULE | Freq: Two times a day (BID) | ORAL | 3 refills | Status: DC

## 2020-10-29 NOTE — Telephone Encounter (Signed)
Sent mychart message for appt change

## 2020-10-30 DIAGNOSIS — M5013 Cervical disc disorder with radiculopathy, cervicothoracic region: Secondary | ICD-10-CM | POA: Diagnosis not present

## 2020-10-30 DIAGNOSIS — M545 Low back pain, unspecified: Secondary | ICD-10-CM | POA: Diagnosis not present

## 2020-10-31 DIAGNOSIS — M9903 Segmental and somatic dysfunction of lumbar region: Secondary | ICD-10-CM | POA: Diagnosis not present

## 2020-10-31 DIAGNOSIS — M9901 Segmental and somatic dysfunction of cervical region: Secondary | ICD-10-CM | POA: Diagnosis not present

## 2020-10-31 DIAGNOSIS — M9905 Segmental and somatic dysfunction of pelvic region: Secondary | ICD-10-CM | POA: Diagnosis not present

## 2020-10-31 DIAGNOSIS — M9902 Segmental and somatic dysfunction of thoracic region: Secondary | ICD-10-CM | POA: Diagnosis not present

## 2020-11-09 ENCOUNTER — Ambulatory Visit
Admission: RE | Admit: 2020-11-09 | Discharge: 2020-11-09 | Disposition: A | Discharge status: Home / Self Care | Payer: BC Managed Care – PPO | Source: Ambulatory Visit | Attending: Gastroenterology | Admitting: Gastroenterology

## 2020-11-09 DIAGNOSIS — R1010 Upper abdominal pain, unspecified: Secondary | ICD-10-CM | POA: Diagnosis not present

## 2020-11-09 DIAGNOSIS — Z8719 Personal history of other diseases of the digestive system: Secondary | ICD-10-CM | POA: Diagnosis not present

## 2020-11-09 DIAGNOSIS — K219 Gastro-esophageal reflux disease without esophagitis: Secondary | ICD-10-CM

## 2020-11-09 MED ORDER — IOPAMIDOL (ISOVUE-300) INJECTION 61%
100.0000 mL | Freq: Once | INTRAVENOUS | Status: AC | PRN
  Administered 2020-11-09: 100 mL via INTRAVENOUS

## 2020-11-12 ENCOUNTER — Telehealth: Payer: Self-pay

## 2020-11-12 DIAGNOSIS — R197 Diarrhea, unspecified: Secondary | ICD-10-CM

## 2020-11-12 DIAGNOSIS — R17 Unspecified jaundice: Secondary | ICD-10-CM

## 2020-11-12 DIAGNOSIS — R1010 Upper abdominal pain, unspecified: Secondary | ICD-10-CM

## 2020-11-12 NOTE — Telephone Encounter (Signed)
LVM for patient to call back. Order is in for the [facility] scan as well

## 2020-11-12 NOTE — Telephone Encounter (Signed)
-----   Message from Jay, DO sent at 11/12/2020  4:03 PM EDT ----- CT abdomen/pelvis was essentially normal.  Normal-appearing liver, GB, pancreas, spleen, and GI tract, and no lymphadenopathy.  Gas and stool noted throughout the colon, but otherwise no colonic thickening or obstructive features.  Given unremarkable study, and per prior conversation, can perform HIDA scan to rule out biliary dyskinesia.

## 2020-11-13 NOTE — Telephone Encounter (Signed)
Patient stated she will call us back regarding the hida scan. Said that she just got a bill regarding the CT scan and would like to check with insurance first to see where is the best for her and she will give [place] a call back when she is ready to schedule it.

## 2020-11-14 DIAGNOSIS — M9901 Segmental and somatic dysfunction of cervical region: Secondary | ICD-10-CM | POA: Diagnosis not present

## 2020-11-14 DIAGNOSIS — M9905 Segmental and somatic dysfunction of pelvic region: Secondary | ICD-10-CM | POA: Diagnosis not present

## 2020-11-14 DIAGNOSIS — M9903 Segmental and somatic dysfunction of lumbar region: Secondary | ICD-10-CM | POA: Diagnosis not present

## 2020-11-14 DIAGNOSIS — M9902 Segmental and somatic dysfunction of thoracic region: Secondary | ICD-10-CM | POA: Diagnosis not present

## 2020-11-15 DIAGNOSIS — M545 Low back pain, unspecified: Secondary | ICD-10-CM | POA: Diagnosis not present

## 2020-11-15 DIAGNOSIS — M5013 Cervical disc disorder with radiculopathy, cervicothoracic region: Secondary | ICD-10-CM | POA: Diagnosis not present

## 2020-11-16 ENCOUNTER — Other Ambulatory Visit: Payer: Self-pay | Admitting: Adult Health

## 2020-11-16 MED ORDER — PROMETHAZINE HCL 25 MG PO TABS: ORAL_TABLET | ORAL | 0 refills | Status: DC

## 2020-11-20 NOTE — Progress Notes (Signed)
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 Stephanie Wise; no longer on medications, declined further follow up.  Hypothyroidism, unspecified type continue medications the same pending lab results reminded to take on an empty stomach 30-19mns before food.  -     TSH  Vitamin D deficiency Continue with supplementation for goal of 70-100 -     VITAMIN D 25 Hydroxy (Vit-D Deficiency, Fractures)  Hyperlipdemia Much improved with weight loss and lifestyle changes Continue low fat diet and exercise -     Lipid panel - defer recheck   Medication management -     CBC with Differential/Platelet -     CMP/GFR -     Magnesium   BMI 24  Continue to recommend diet heavy in fruits and veggies and low in animal meats, cheeses, and dairy products, appropriate calorie intake Discuss exercise recommendations routinely Continue to monitor weight at each visit  Anxiety Continue cymbalta for pain benefit; vistaril/xanax PRN, reviewed limiting use due to tolerance potential  Stress management techniques discussed, increase water, good sleep hygiene discussed, increase exercise, and increase veggies.   Headaches she reports well managed by dietary modification Declines further evaluation or management at this time  Gastritis/epigastric pain GI is managing; Continue PPI, lifestyle reviewed; avoid ingesting anything other than food and approved supplements Discussed diet, avoiding triggers and other lifestyle changes Stress management  Discussed med's effects and SE's. Lbs and tests as requested with regular follow-up as recommended. Over 30 minutes of exam, counseling, chart review, and complex, high level critical decision making was performed this visit.   Future Appointments  Date Time Provider [place] 05/02/2021  8:30 AM Wise, Charlotte Means MD [facility] None  05/20/2021  9:00 AM  CLiane Comber NP [facility] None    HPI  45y.o. female  presents for 6 month follow up on depression/anxiety/insomnia, pain, cholesterol, thyroid, vitamin D def.   She was previously followed by Dr. AStephanie Acrefor schizophreniform disorder, and was seeing FFrancee Wise(counsellor) regularly as well in 2018, but reports she has tapered off of medications and was doing well. She does not believe the diagnoses was accurate.   She was referred to neuro for unexplained paresthesias/muscle pain, unremarkable workup and was started on cymbalta 60 mg by neuro, has noted pain benefit, decreased gabapentin use. She was having anxiety, buspar not helpful, given vistaril, also has temazepam PRN sleep, avoids daily use.   She had epigastric pain ? Onset after ingesting essential oils, had EGD/colonoscopy on 05/01/2020 by Dr. CBryan Wise showed gastritis and some adenomatous colon polyps recommended for 3 year recall. She has been on Protonix, also taking pepto bismol, some flare recently [place] but improved from previous.   BMI is Body mass index is 24.17 kg/m., she is working on diet and exercise.  She walks most days, does yoga. She does watch her diet, has lost weight previously, plans to get down to 130 lb over the next year or so.  Drinks mainly water/sugar free drinks, no soda Has stopped coffee, avoiding caffeine, has cut out alcohol Wt Readings from Last 3 Encounters:  11/21/20 143 lb (64.9 kg)  10/29/20 142 lb (64.4 kg)  10/22/20 141 lb 4 oz (64.1 kg)   Her blood pressure has been controlled at home (110/70), today their BP is BP: 124/78 She does workout. She denies chest pain, shortness of breath, dizziness.  She is not on cholesterol medication. Her cholesterol is at goal. The cholesterol last visit was:   Lab Results  Component Value Date   CHOL 165 05/18/2019   HDL 58 05/18/2019   LDLCALC 93 05/18/2019   TRIG 53 05/18/2019   CHOLHDL 2.8 05/18/2019    Last A1C in  the office was:  Lab Results  Component Value Date   HGBA1C 5.1 05/18/2019   She is on thyroid medication. Her medication was not changed last visit. Take 75 mcg daily, 1/2 tab on Mon/Thurs.   Lab Results  Component Value Date   TSH 0.77 05/18/2020   Last GFR: Lab Results  Component Value Date   GFRNONAA 94 08/07/2020   Patient is on Vitamin D supplement, not taking as frequently due to recent GI flare:    Lab Results  Component Value Date   VD25OH 85 05/18/2020        Current Medications:  Current Outpatient Medications on File Prior to Visit  Medication Sig Dispense Refill   acetaminophen (TYLENOL) 500 MG tablet Take 500 mg by mouth every 6 (six) hours as needed. Takes 0-6 per day depending on her pain level     aluminum-magnesium hydroxide-simethicone (MAALOX) 200-200-20 MG/5ML SUSP Take 20 mLs by mouth 3 (three) times daily.     AMBULATORY NON FORMULARY MEDICATION 1 Scoop daily. Medication Name: Clear Vite-PSF (k-84) Enzyme based, multivitamin, mineral & herbal dietary supplement powder     AMBULATORY NON FORMULARY MEDICATION 1 tablet at bedtime. Medication Name: Marny Lowenstein dietary supplement with proteolytiic enzymes for the gut     AMBULATORY NON FORMULARY MEDICATION 2 tablets daily in the afternoon. Medication Name: Drenamin dietary supplement 3700 for adrenal support     AMBULATORY NON FORMULARY MEDICATION 3 tablets daily in the afternoon. Medication Name: A-F betafood dietary supplement 0825 for gallbladder     AMBULATORY NON FORMULARY MEDICATION 90 ml of lidocaine 2% 90 ml dicyclomine 10/mg per 54m 270 ml of maalox  Take 5 ml every 12 hours as needed 450 mL 0   Bismuth Subsalicylate (PEPTO-BISMOL PO) Take by mouth as needed.     Ca Carbonate-Mag Hydroxide (ROLAIDS PO) Take 1 each by mouth as needed.     dicyclomine (BENTYL) 10 MG capsule Take 1 capsule (10 mg total) by mouth 4 (four) times daily -  before meals and at bedtime. 120 capsule 2   DULoxetine  (CYMBALTA) 60 MG capsule Take 1 capsule (60 mg total) by mouth 2 (two) times daily. 180 capsule 3   gabapentin (NEURONTIN) 300 MG capsule TAKE 3 CAPSULES(900 MG) BY MOUTH THREE TIMES DAILY AS NEEDED FOR PAIN 270 capsule 1   hydrOXYzine (ATARAX/VISTARIL) 50 MG tablet Take 1-2 tablets (50-100 mg total) by mouth every 6 (six) hours as needed for anxiety. 120 tablet 0   ibuprofen (ADVIL) 200 MG tablet Take 200 mg by mouth every 6 (six) hours as needed.     levothyroxine (SYNTHROID) 75 MCG tablet TAKE 1 TABLET DAILY ON EMPTY STOMACH WITH ONLY WATER FOR 30 MINUTES & NO ANTACID MEDS, CALCIUM OR MAGNESIUM FOR 4 HOURS AND AVOID BIOTIN 90 tablet 3   MAGNESIUM PO Take 400 mg by mouth daily.     Multiple Vitamins-Minerals (MULTIVITAMIN ADULTS PO) Take 1 tablet by mouth daily.     Omega 3-6-9 Fatty Acids (OMEGA-3-6-9 PO) Take 1 Dose by mouth daily.     pantoprazole (PROTONIX) 40 MG tablet Take 1 tablet (40 mg total) by mouth daily. 90 tablet 3  promethazine (PHENERGAN) 25 MG tablet TAKE 1 TABLET BY MOUTH EVERY 6 HOURS AS NEEDED FOR NAUSEA OR HEADACHE 30 tablet 0   psyllium (METAMUCIL) 58.6 % powder Take 1 packet by mouth daily as needed.     temazepam (RESTORIL) 30 MG capsule TAKE 1 CAPSULE BY MOUTH EVERY NIGHT AT BEDTIME AS NEEDED FOR SLEEP. 30 capsule 0   Valerian 100 MG CAPS OTC taking PRN anxiety 840 capsule 0   VITAMIN D PO Take 10,000 Units by mouth daily.     No current facility-administered medications on file prior to visit.   Allergies:  Allergies  Allergen Reactions   Hydrocodone Itching   Oxycodone-Acetaminophen    Penicillins Swelling   Medical History:  She has Schizophreniform disorder ([place]); Hypothyroid; Vitamin D deficiency; BMI 24.0-24.9, adult; Early dry stage nonexudative age-related macular degeneration of both eyes; Insomnia; Hyperlipidemia; Major depression in remission ([place]); Neck pain; Sciatica of right side; Hyperreflexia; Mid back pain; Family history of MS (multiple  sclerosis); Pain of right lower extremity; Right arm pain; Anxiety; and History of adenomatous polyp of colon on their problem list.  Surgical History:  She has a past surgical history that includes Tonsillectomy and adenoidectomy (1981); Knee arthroscopy (Left, 1988); Knee arthroscopy (Right, 1999); Ankle arthroscopy (Right, 2000); laparoscopy (2001); Nasal polyp surgery (2006); Colonoscopy (05/01/2020); and Upper gastrointestinal endoscopy (05/01/2020). Family History:  Herfamily history includes Alcohol abuse in her brother, father, and mother; Clotting disorder in her mother; Drug abuse in her brother, father, and mother; Heart disease in her brother and paternal grandfather; Kidney disease in her maternal grandfather; Other in her mother; Stroke in her paternal grandfather; Thyroid cancer in her maternal grandmother. Social History:  She reports that she has never smoked. She has never used smokeless tobacco. She reports that she does not currently use alcohol. She reports that she does not use drugs.  Review of Systems: Review of Systems  Constitutional:  Negative for malaise/fatigue and weight loss.  HENT:  Negative for hearing loss and tinnitus.   Eyes:  Negative for blurred vision and double vision.  Respiratory:  Negative for cough, shortness of breath and wheezing.   Cardiovascular:  Negative for chest pain, palpitations, orthopnea, claudication and leg swelling.  Gastrointestinal:  Negative for abdominal pain (epigastric, improved), blood in stool, constipation, diarrhea, heartburn (improving), melena, nausea and vomiting.       Bloating  Genitourinary: Negative.   Musculoskeletal:  Negative for joint pain and myalgias.  Skin:  Negative for rash.  Neurological:  Negative for dizziness, tingling, sensory change, weakness and headaches.  Endo/Heme/Allergies:  Negative for environmental allergies and polydipsia.  Psychiatric/Behavioral:  Negative for depression, hallucinations, memory  loss, substance abuse and suicidal ideas. The patient is not nervous/anxious (improved) and does not have insomnia.   All other systems reviewed and are negative.  Physical Exam: Estimated body mass index is 24.17 kg/m as calculated from the following:   Height as of 10/29/20: 5' 4.5" (1.638 m).   Weight as of this encounter: 143 lb (64.9 kg). BP 124/78   Pulse 78   Temp (!) 97.3 F (36.3 C)   Wt 143 lb (64.9 kg)   SpO2 99%   BMI 24.17 kg/m  General Appearance: Well nourished, in no apparent distress.  Eyes: PERRLA, EOMs, conjunctiva no swelling or erythema Sinuses: No Frontal/maxillary tenderness  ENT/Mouth: Ext aud canals clear, normal light reflex with TMs without erythema, bulging. Good dental hygiene, oral MM intact. Hearing normal.  Neck: Supple, thyroid normal. No bruits  Respiratory: Respiratory effort normal, BS equal bilaterally without rales, rhonchi, wheezing or stridor.  Cardio: RRR without murmurs, rubs or gallops. Brisk peripheral pulses without edema.  Chest: symmetric, with normal excursions and percussion.  Breasts: Declines, defer to GYN Abdomen: Soft, nontender, no guarding, rebound, hernias, masses, or organomegaly.  Lymphatics: Non tender without lymphadenopathy.  Genitourinary: Defer to GYN Musculoskeletal: Full ROM all peripheral extremities,5/5 strength, and normal gait.  Skin: Warm, dry without rashes, lesions, ecchymosis. Neuro: Cranial nerves intact, reflexes equal bilaterally. Normal muscle tone, no cerebellar symptoms. Sensation intact.  Psych: Awake and oriented X 3, normal affect, Insight and Judgment fair.    Izora Ribas, NP 10:07 AM [facility]

## 2020-11-21 ENCOUNTER — Encounter: Payer: Self-pay | Admitting: Adult Health

## 2020-11-21 ENCOUNTER — Ambulatory Visit: Payer: BC Managed Care – PPO | Admitting: Adult Health

## 2020-11-21 ENCOUNTER — Other Ambulatory Visit: Payer: Self-pay

## 2020-11-21 VITALS — BP 124/78 | HR 78 | Temp 97.3°F | Wt 143.0 lb

## 2020-11-21 DIAGNOSIS — E038 Other specified hypothyroidism: Secondary | ICD-10-CM

## 2020-11-21 DIAGNOSIS — G4700 Insomnia, unspecified: Secondary | ICD-10-CM

## 2020-11-21 DIAGNOSIS — E785 Hyperlipidemia, unspecified: Secondary | ICD-10-CM | POA: Diagnosis not present

## 2020-11-21 DIAGNOSIS — Z79899 Other long term (current) drug therapy: Secondary | ICD-10-CM | POA: Diagnosis not present

## 2020-11-21 DIAGNOSIS — Z6824 Body mass index (BMI) 24.0-24.9, adult: Secondary | ICD-10-CM

## 2020-11-21 DIAGNOSIS — F325 Major depressive disorder, single episode, in full remission: Secondary | ICD-10-CM

## 2020-11-21 DIAGNOSIS — F2081 Schizophreniform disorder: Secondary | ICD-10-CM

## 2020-11-21 DIAGNOSIS — F419 Anxiety disorder, unspecified: Secondary | ICD-10-CM | POA: Diagnosis not present

## 2020-11-21 DIAGNOSIS — E063 Autoimmune thyroiditis: Secondary | ICD-10-CM | POA: Diagnosis not present

## 2020-11-21 DIAGNOSIS — E559 Vitamin D deficiency, unspecified: Secondary | ICD-10-CM | POA: Diagnosis not present

## 2020-11-22 ENCOUNTER — Other Ambulatory Visit: Payer: Self-pay | Admitting: Adult Health

## 2020-11-22 DIAGNOSIS — E038 Other specified hypothyroidism: Secondary | ICD-10-CM

## 2020-11-22 DIAGNOSIS — E063 Autoimmune thyroiditis: Secondary | ICD-10-CM

## 2020-11-22 DIAGNOSIS — E039 Hypothyroidism, unspecified: Secondary | ICD-10-CM

## 2020-11-22 LAB — CBC WITH DIFFERENTIAL/PLATELET
Absolute Monocytes: 638 cells/uL (ref 200–950)
Basophils Absolute: 40 cells/uL (ref 0–200)
Basophils Relative: 0.7 %
Eosinophils Absolute: 211 cells/uL (ref 15–500)
Eosinophils Relative: 3.7 %
HCT: 37.3 % (ref 35.0–45.0)
Hemoglobin: 12.4 g/dL (ref 11.7–15.5)
Lymphs Abs: 1334 cells/uL (ref 850–3900)
MCH: 32.5 pg (ref 27.0–33.0)
MCHC: 33.2 g/dL (ref 32.0–36.0)
MCV: 97.9 fL (ref 80.0–100.0)
MPV: 9.5 fL (ref 7.5–12.5)
Monocytes Relative: 11.2 %
Neutro Abs: 3477 cells/uL (ref 1500–7800)
Neutrophils Relative %: 61 %
Platelets: 333 10*3/uL (ref 140–400)
RBC: 3.81 10*6/uL (ref 3.80–5.10)
RDW: 12.8 % (ref 11.0–15.0)
Total Lymphocyte: 23.4 %
WBC: 5.7 10*3/uL (ref 3.8–10.8)

## 2020-11-22 LAB — COMPLETE METABOLIC PANEL WITH GFR
AG Ratio: 1.9 (calc) (ref 1.0–2.5)
ALT: 14 U/L (ref 6–29)
AST: 19 U/L (ref 10–35)
Albumin: 4.5 g/dL (ref 3.6–5.1)
Alkaline phosphatase (APISO): 52 U/L (ref 31–125)
BUN: 14 mg/dL (ref 7–25)
CO2: 31 mmol/L (ref 20–32)
Calcium: 9.1 mg/dL (ref 8.6–10.2)
Chloride: 101 mmol/L (ref 98–110)
Creat: 0.92 mg/dL (ref 0.50–0.99)
Globulin: 2.4 g/dL (calc) (ref 1.9–3.7)
Glucose, Bld: 76 mg/dL (ref 65–99)
Potassium: 4.7 mmol/L (ref 3.5–5.3)
Sodium: 139 mmol/L (ref 135–146)
Total Bilirubin: 0.7 mg/dL (ref 0.2–1.2)
Total Protein: 6.9 g/dL (ref 6.1–8.1)
eGFR: 78 mL/min/{1.73_m2} (ref >=60)

## 2020-11-22 LAB — VITAMIN D 25 HYDROXY (VIT D DEFICIENCY, FRACTURES): Vit D, 25-Hydroxy: 61 ng/mL (ref 30–100)

## 2020-11-22 LAB — MAGNESIUM: Magnesium: 2.3 mg/dL (ref 1.5–2.5)

## 2020-11-22 LAB — TSH: TSH: 5.22 mIU/L — ABNORMAL HIGH

## 2020-11-22 MED ORDER — LEVOTHYROXINE SODIUM 75 MCG PO TABS: ORAL_TABLET | ORAL | 3 refills | Status: DC

## 2020-12-18 DIAGNOSIS — M9901 Segmental and somatic dysfunction of cervical region: Secondary | ICD-10-CM | POA: Diagnosis not present

## 2020-12-18 DIAGNOSIS — M7918 Myalgia, other site: Secondary | ICD-10-CM | POA: Diagnosis not present

## 2020-12-18 DIAGNOSIS — M25551 Pain in right hip: Secondary | ICD-10-CM | POA: Diagnosis not present

## 2020-12-18 DIAGNOSIS — M9902 Segmental and somatic dysfunction of thoracic region: Secondary | ICD-10-CM | POA: Diagnosis not present

## 2020-12-18 DIAGNOSIS — M9903 Segmental and somatic dysfunction of lumbar region: Secondary | ICD-10-CM | POA: Diagnosis not present

## 2020-12-18 DIAGNOSIS — M9905 Segmental and somatic dysfunction of pelvic region: Secondary | ICD-10-CM | POA: Diagnosis not present

## 2020-12-19 DIAGNOSIS — N951 Menopausal and female climacteric states: Secondary | ICD-10-CM | POA: Diagnosis not present

## 2020-12-19 DIAGNOSIS — Z30432 Encounter for removal of intrauterine contraceptive device: Secondary | ICD-10-CM | POA: Diagnosis not present

## 2020-12-19 DIAGNOSIS — R232 Flushing: Secondary | ICD-10-CM | POA: Diagnosis not present

## 2020-12-25 NOTE — Progress Notes (Signed)
Future Appointments  Date Time Provider [place]  12/26/2020  9:00 AM Unk Pinto, MD [facility] None  05/02/2021  8:30 AM Felecia Shelling, Nanine Means, MD [facility] None  05/22/2021  9:00 AM Liane Comber, NP [facility] None    History of Present Illness:     This 45 yo MWF w/hx/o Hypothyroidism predating circa 1990's. She presents today with concern re: "black spot" on the back of her tongue.  She also recounts ongoing dyspepsia thru-out the day & night. Diet is discussed & she's advised to stop her daily bananas. And to eat oatmeal.   Medications    levothyroxine 75 MCG tablet, TAKE 1 TABLET DAILY    promethazine 25 MG tablet, TAKE 1 TABLET EVERY 6 HOURS AS NEEDED    acetaminophen (TYLENOL) 500 MG tablet, Take 500 mg every 6 (six) hours as needed.   ibuprofen 200 MG tablet, Take 200 mg every 6 (six) hours as needed. MAALOX SUSP, Take 20 mLs 3 (three) times daily.   AMBULATORY NON FORMULARY MEDICATION, 1 Scoop daily. Medication Name: Clear Vite-PSF (k-84) Enzyme based, multivitamin, mineral & herbal dietary supplement powder   PEPTO-BISMOL  as needed.   ROLAIDS PO as needed.   dicyclomine 10 MG capsule, Take 1 capsule 4  times daily    DULoxetine 60 MG capsule, Take 1 capsule 2 times daily.   gabapentin 300 MG capsule, TAKE 3 CAPS THREE TIMES DAILY AS NEEDED FOR PAIN   hydrOXYzine 50 MG tablet, Take 1-2 tablets  every 6 hours as needed for anxiety.   MAGNESIUM , Take 400 mg daily.   Multiple Vitamins-Minerals  Take 1 tablet daily.   Omega 3-6-9 OMEGA-, Take 1 Dose  daily.   PROTONIX 40 MG tablet, Take 1 tablet daily.   METAMUCIL , Take 1 packet daily as needed.   temazepam 30 MG capsule, TAKE 1 CAPSULE EVERY NIGHT AT BEDTIME AS NEEDED FOR SLEEP.   Valerian 100 MG CAPS, OTC taking PRN anxiety   VITAMIN D PO, Take 10,000 Units by mouth daily.  Problem list She has Schizophreniform disorder ([place]); Hypothyroid; Vitamin D deficiency; BMI 24.0-24.9, adult; Early dry stage nonexudative  age-related macular degeneration of both eyes; Insomnia; Hyperlipidemia; Major depression in remission ([place]); Neck pain; Sciatica of right side; Hyperreflexia; Mid back pain; Family history of MS (multiple sclerosis); Pain of right lower extremity; Right arm pain; Anxiety; and History of adenomatous polyp of colon on their problem list.   Observations/Objective:  BP 128/84   Pulse 64   Temp 97.7 F (36.5 C)   Resp 16   Ht 5' 4.5" (1.638 m)   Wt 147 lb 3.2 oz (66.8 kg)   SpO2 94%   BMI 24.88 kg/m   HEENT - WNL, except  has a 1 x 1.5 cm area of dark pigmentation of the papilla of the posterior Lt base of tongue.  Neck - supple. No thyromegaly or lymphadenopathy.  Chest - Clear equal BS. Cor - Nl HS. RRR w/o sig MGR. PP 1(+). No edema. MS- FROM w/o deformities.  Gait Nl. Neuro -  Nl w/o focal abnormalities.   Assessment and Plan:  1. Hypothyroidism  - TSH  2. Tongue lesion  - Ambulatory referral to ENT To r/o possible need for Bx.    3. Gastroesophageal reflux disease with esophagitis  - Discussed anti-dyspeptic diet, stop bananas , recommend oatmeal qam, consider trias increasing Protonix to bid or remain Protonix qam with a "booster" of Pepcid at night.    4. Medication  management  - TSH   Follow Up Instructions:        I discussed the assessment and treatment plan with the patient. The patient was provided an opportunity to ask questions and all were answered. The patient agreed with the plan and demonstrated an understanding of the instructions.       The patient was advised to call back or seek an in-person evaluation if the symptoms worsen or if the condition fails to improve as anticipated.    Kirtland Bouchard, MD

## 2020-12-26 ENCOUNTER — Other Ambulatory Visit: Payer: BC Managed Care – PPO

## 2020-12-26 ENCOUNTER — Encounter: Payer: Self-pay | Admitting: Internal Medicine

## 2020-12-26 ENCOUNTER — Ambulatory Visit: Payer: BC Managed Care – PPO | Admitting: Internal Medicine

## 2020-12-26 ENCOUNTER — Other Ambulatory Visit: Payer: Self-pay

## 2020-12-26 VITALS — BP 128/84 | HR 64 | Temp 97.7°F | Resp 16 | Ht 64.5 in | Wt 147.2 lb

## 2020-12-26 DIAGNOSIS — K148 Other diseases of tongue: Secondary | ICD-10-CM | POA: Diagnosis not present

## 2020-12-26 DIAGNOSIS — K2100 Gastro-esophageal reflux disease with esophagitis, without bleeding: Secondary | ICD-10-CM | POA: Diagnosis not present

## 2020-12-26 DIAGNOSIS — Z79899 Other long term (current) drug therapy: Secondary | ICD-10-CM

## 2020-12-26 DIAGNOSIS — E039 Hypothyroidism, unspecified: Secondary | ICD-10-CM | POA: Diagnosis not present

## 2020-12-26 LAB — TSH: TSH: 3.57 mIU/L

## 2020-12-27 NOTE — Progress Notes (Signed)
-   Thyroid is Back in Normal Range.

## 2021-01-01 ENCOUNTER — Ambulatory Visit: Payer: BC Managed Care – PPO | Admitting: Internal Medicine

## 2021-01-03 DIAGNOSIS — M545 Low back pain, unspecified: Secondary | ICD-10-CM | POA: Diagnosis not present

## 2021-01-03 DIAGNOSIS — M9903 Segmental and somatic dysfunction of lumbar region: Secondary | ICD-10-CM | POA: Diagnosis not present

## 2021-01-03 DIAGNOSIS — M25551 Pain in right hip: Secondary | ICD-10-CM | POA: Diagnosis not present

## 2021-01-03 DIAGNOSIS — M9905 Segmental and somatic dysfunction of pelvic region: Secondary | ICD-10-CM | POA: Diagnosis not present

## 2021-01-03 DIAGNOSIS — M9901 Segmental and somatic dysfunction of cervical region: Secondary | ICD-10-CM | POA: Diagnosis not present

## 2021-01-03 DIAGNOSIS — M5013 Cervical disc disorder with radiculopathy, cervicothoracic region: Secondary | ICD-10-CM | POA: Diagnosis not present

## 2021-01-03 DIAGNOSIS — M7918 Myalgia, other site: Secondary | ICD-10-CM | POA: Diagnosis not present

## 2021-01-03 DIAGNOSIS — M9902 Segmental and somatic dysfunction of thoracic region: Secondary | ICD-10-CM | POA: Diagnosis not present

## 2021-01-17 DIAGNOSIS — M9902 Segmental and somatic dysfunction of thoracic region: Secondary | ICD-10-CM | POA: Diagnosis not present

## 2021-01-17 DIAGNOSIS — M9905 Segmental and somatic dysfunction of pelvic region: Secondary | ICD-10-CM | POA: Diagnosis not present

## 2021-01-17 DIAGNOSIS — M9903 Segmental and somatic dysfunction of lumbar region: Secondary | ICD-10-CM | POA: Diagnosis not present

## 2021-01-17 DIAGNOSIS — M7918 Myalgia, other site: Secondary | ICD-10-CM | POA: Diagnosis not present

## 2021-01-17 DIAGNOSIS — M25551 Pain in right hip: Secondary | ICD-10-CM | POA: Diagnosis not present

## 2021-01-17 DIAGNOSIS — M9901 Segmental and somatic dysfunction of cervical region: Secondary | ICD-10-CM | POA: Diagnosis not present

## 2021-01-22 DIAGNOSIS — M545 Low back pain, unspecified: Secondary | ICD-10-CM | POA: Diagnosis not present

## 2021-01-22 DIAGNOSIS — M5013 Cervical disc disorder with radiculopathy, cervicothoracic region: Secondary | ICD-10-CM | POA: Diagnosis not present

## 2021-01-31 ENCOUNTER — Other Ambulatory Visit: Payer: Self-pay | Admitting: Neurology

## 2021-02-04 DIAGNOSIS — M9905 Segmental and somatic dysfunction of pelvic region: Secondary | ICD-10-CM | POA: Diagnosis not present

## 2021-02-04 DIAGNOSIS — M9903 Segmental and somatic dysfunction of lumbar region: Secondary | ICD-10-CM | POA: Diagnosis not present

## 2021-02-04 DIAGNOSIS — M7918 Myalgia, other site: Secondary | ICD-10-CM | POA: Diagnosis not present

## 2021-02-04 DIAGNOSIS — M25551 Pain in right hip: Secondary | ICD-10-CM | POA: Diagnosis not present

## 2021-02-04 DIAGNOSIS — M9901 Segmental and somatic dysfunction of cervical region: Secondary | ICD-10-CM | POA: Diagnosis not present

## 2021-02-04 DIAGNOSIS — M9902 Segmental and somatic dysfunction of thoracic region: Secondary | ICD-10-CM | POA: Diagnosis not present

## 2021-02-05 ENCOUNTER — Other Ambulatory Visit: Payer: Self-pay | Admitting: Adult Health

## 2021-02-05 ENCOUNTER — Other Ambulatory Visit: Payer: Self-pay | Admitting: Internal Medicine

## 2021-02-14 ENCOUNTER — Other Ambulatory Visit: Payer: Self-pay | Admitting: Internal Medicine

## 2021-02-14 DIAGNOSIS — E039 Hypothyroidism, unspecified: Secondary | ICD-10-CM

## 2021-03-06 DIAGNOSIS — M545 Low back pain, unspecified: Secondary | ICD-10-CM | POA: Diagnosis not present

## 2021-03-06 DIAGNOSIS — M5013 Cervical disc disorder with radiculopathy, cervicothoracic region: Secondary | ICD-10-CM | POA: Diagnosis not present

## 2021-04-04 DIAGNOSIS — M5013 Cervical disc disorder with radiculopathy, cervicothoracic region: Secondary | ICD-10-CM | POA: Diagnosis not present

## 2021-04-04 DIAGNOSIS — M545 Low back pain, unspecified: Secondary | ICD-10-CM | POA: Diagnosis not present

## 2021-04-10 DIAGNOSIS — M9905 Segmental and somatic dysfunction of pelvic region: Secondary | ICD-10-CM | POA: Diagnosis not present

## 2021-04-10 DIAGNOSIS — M9902 Segmental and somatic dysfunction of thoracic region: Secondary | ICD-10-CM | POA: Diagnosis not present

## 2021-04-10 DIAGNOSIS — M9901 Segmental and somatic dysfunction of cervical region: Secondary | ICD-10-CM | POA: Diagnosis not present

## 2021-04-10 DIAGNOSIS — M9903 Segmental and somatic dysfunction of lumbar region: Secondary | ICD-10-CM | POA: Diagnosis not present

## 2021-04-18 DIAGNOSIS — M545 Low back pain, unspecified: Secondary | ICD-10-CM | POA: Diagnosis not present

## 2021-04-18 DIAGNOSIS — M5013 Cervical disc disorder with radiculopathy, cervicothoracic region: Secondary | ICD-10-CM | POA: Diagnosis not present

## 2021-04-24 DIAGNOSIS — M545 Low back pain, unspecified: Secondary | ICD-10-CM | POA: Diagnosis not present

## 2021-04-24 DIAGNOSIS — M5013 Cervical disc disorder with radiculopathy, cervicothoracic region: Secondary | ICD-10-CM | POA: Diagnosis not present

## 2021-05-01 ENCOUNTER — Ambulatory Visit: Payer: BC Managed Care – PPO | Admitting: Family Medicine

## 2021-05-02 ENCOUNTER — Encounter: Payer: Self-pay | Admitting: Neurology

## 2021-05-02 ENCOUNTER — Ambulatory Visit: Payer: BC Managed Care – PPO | Admitting: Neurology

## 2021-05-02 ENCOUNTER — Other Ambulatory Visit: Payer: Self-pay | Admitting: Gastroenterology

## 2021-05-02 VITALS — BP 137/86 | HR 74 | Ht 61.5 in | Wt 151.0 lb

## 2021-05-02 DIAGNOSIS — M5412 Radiculopathy, cervical region: Secondary | ICD-10-CM | POA: Diagnosis not present

## 2021-05-02 DIAGNOSIS — M5431 Sciatica, right side: Secondary | ICD-10-CM

## 2021-05-02 DIAGNOSIS — M542 Cervicalgia: Secondary | ICD-10-CM

## 2021-05-02 MED ORDER — GABAPENTIN 300 MG PO CAPS: ORAL_CAPSULE | ORAL | 5 refills | Status: DC

## 2021-05-02 MED ORDER — ETODOLAC 400 MG PO TABS: 400.0000 mg | ORAL_TABLET | Freq: Two times a day (BID) | ORAL | 5 refills | Status: DC

## 2021-05-02 NOTE — Progress Notes (Signed)
[facility]  PATIENT: Charlotte Wise DOB: January 16, 1976  REFERRING DOCTOR OR PCP: Unk Pinto SOURCE: Patient, notes from primary care  _________________________________   HISTORICAL  CHIEF COMPLAINT:  Chief Complaint  Patient presents with   Follow-up    Rm 1, alone. Pt stopped Cymbalta due to its ineffectiveness. R arm continue to cause trouble in her daily living. Here to discuss other options.     HISTORY OF PRESENT ILLNESS:   Charlotte Wise is a 46 year old woman who has had numbness and pain since March 2021.      Update 05/02/2021: She continues to report pain in her right arm,  more on the inner arm, If pain is mild but the entire arm when having more pain.   Using the arm increases her pain.  She feels it fatigues easily.   She feels it is life limiting as she can only work part time.  Pain persists despite gabapentin 600 mg po qid   Imipramine had not helped.    If she does a lot of computer work or lifts more in one day, she reports increased pain x weeks.   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  There was foraminal narrowing at these levels.  Could affect right C7 nerve root.  She also has LB and right leg pain     MRI lumbar just showed mild bulging at L5-S1 but no nerve root compression.  Marland Kitchen     History of back and leg pain pain: She was moving mulch with a large bucket (multiple trips) 06/02/2019 and she felt very tired later that day.  She was so tired the next 5 days that she was sleeping more.   She started to have right leg >> arm pain about 5 days later.   A few days later (06/11/19) she started to experience total body pain, right > left.  The leg was most involved but she also noted symptoms in her flank.  Arms were involved to a lesser extent.   She also noted numbness in her neck and face.  She also noted spasms in her leg.  These symptoms were constant for a couple weeks.   She  did not note weakness.    She noted no change in gait.  Bladder function was unchanged.   After the pain and numbness was constant for a while it changed to become more intermittent a few weeks ago.   However, she had more neck and back pain as the numbness improved.  DATA MRI of the cervical spine 08/28/2019 showed mild to moderate spinal stenosis at C5-C6 and at C6-C7.  There was foraminal narrowing at these levels.  Could affect right C7 nerve root.  MRI of the lumbar spine 08/28/2019 was borderline normal MRI showing minimal disc bulging at L5-S1 but no nerve root compression or spinal stenosis.  MRI of the thoracic spine 08/28/2019 was normal.   NCV/EMG study 01/03/2020 shows the following: 1.   There was no electrophysiologic evidence of a polyneuropathy or mononeuropathy. 2.  Possible mild right chronic C7 radiculopathy.  No evidence of lumbar radiculopathy.  REVIEW OF SYSTEMS: Constitutional: No fevers, chills, sweats, or change in appetite.  She has insomnia Eyes: No visual changes, double vision, eye pain Ear, nose and throat: No hearing loss, ear pain, nasal congestion, sore throat Cardiovascular: No chest pain, palpitations Respiratory:  No shortness of breath at  rest or with exertion.   No wheezes GastrointestinaI: No nausea, vomiting, diarrhea, abdominal pain, fecal incontinence Genitourinary:  No dysuria, urinary retention or frequency.  No nocturia. Musculoskeletal:  No neck pain, back pain Integumentary: No rash, pruritus, skin lesions Neurological: as above Psychiatric: H/o depression Endocrine: No palpitations, diaphoresis, change in appetite, change in weigh or increased thirst Hematologic/Lymphatic:  No anemia, purpura, petechiae. Allergic/Immunologic: No itchy/runny eyes, nasal congestion, recent allergic reactions, rashes  ALLERGIES: Allergies  Allergen Reactions   Hydrocodone Itching   Oxycodone-Acetaminophen    Penicillins Swelling    HOME  MEDICATIONS:  Current Outpatient Medications:    acetaminophen (TYLENOL) 500 MG tablet, Take 500 mg by mouth every 6 (six) hours as needed. Takes 0-6 per day depending on her pain level, Disp: , Rfl:    aluminum-magnesium hydroxide-simethicone (MAALOX) 867-672-09 MG/5ML SUSP, Take 20 mLs by mouth 3 (three) times daily., Disp: , Rfl:    AMBULATORY NON FORMULARY MEDICATION, 1 Scoop daily. Medication Name: Clear Vite-PSF (k-84) Enzyme based, multivitamin, mineral & herbal dietary supplement powder, Disp: , Rfl:    AMBULATORY NON FORMULARY MEDICATION, 1 tablet at bedtime. Medication Name: Marny Lowenstein dietary supplement with proteolytiic enzymes for the gut, Disp: , Rfl:    AMBULATORY NON FORMULARY MEDICATION, 2 tablets daily in the afternoon. Medication Name: Drenamin dietary supplement 3700 for adrenal support, Disp: , Rfl:    AMBULATORY NON FORMULARY MEDICATION, 3 tablets daily in the afternoon. Medication Name: A-F betafood dietary supplement 0825 for gallbladder, Disp: , Rfl:    AMBULATORY NON FORMULARY MEDICATION, 90 ml of lidocaine 2% 90 ml dicyclomine 10/mg per 61ml 270 ml of maalox  Take 5 ml every 12 hours as needed, Disp: 450 mL, Rfl: 0   Bismuth Subsalicylate (PEPTO-BISMOL PO), Take by mouth as needed., Disp: , Rfl:    Ca Carbonate-Mag Hydroxide (ROLAIDS PO), Take 1 each by mouth as needed., Disp: , Rfl:    dicyclomine (BENTYL) 10 MG capsule, Take 1 capsule (10 mg total) by mouth 4 (four) times daily -  before meals and at bedtime., Disp: 120 capsule, Rfl: 2   etodolac (LODINE) 400 MG tablet, Take 1 tablet (400 mg total) by mouth 2 (two) times daily., Disp: 60 tablet, Rfl: 5   hydrOXYzine (ATARAX/VISTARIL) 50 MG tablet, Take 1-2 tablets (50-100 mg total) by mouth every 6 (six) hours as needed for anxiety., Disp: 120 tablet, Rfl: 0   ibuprofen (ADVIL) 200 MG tablet, Take 200 mg by mouth every 6 (six) hours as needed., Disp: , Rfl:    levothyroxine (SYNTHROID) 75 MCG tablet, TAKE 1 TABLET  DAILY ON EMPTY STOMACH WITH ONLY WATER FOR 30 MINUTES & NO ANTACID MEDS, CALCIUM OR MAGNESIUM FOR 4 HOURS AND AVOID BIOTIN, Disp: 90 tablet, Rfl: 3   LO LOESTRIN FE 1 MG-10 MCG / 10 MCG tablet, Take 1 tablet by mouth daily., Disp: , Rfl:    MAGNESIUM PO, Take 400 mg by mouth daily., Disp: , Rfl:    Multiple Vitamins-Minerals (MULTIVITAMIN ADULTS PO), Take 1 tablet by mouth daily., Disp: , Rfl:    Omega 3-6-9 Fatty Acids (OMEGA-3-6-9 PO), Take 1 Dose by mouth daily., Disp: , Rfl:    promethazine (PHENERGAN) 25 MG tablet, TAKE 1 TABLET BY MOUTH EVERY 6 HOURS AS NEEDED FOR NAUSEA OR HEADACHE, Disp: 30 tablet, Rfl: 0   psyllium (METAMUCIL) 58.6 % powder, Take 1 packet by mouth daily as needed., Disp: , Rfl:    temazepam (RESTORIL) 30 MG capsule, TAKE 1 CAPSULE BY MOUTH  EVERY NIGHT AT BEDTIME AS NEEDED FOR SLEEP., Disp: 30 capsule, Rfl: 0   Valerian 100 MG CAPS, OTC taking PRN anxiety, Disp: 840 capsule, Rfl: 0   VITAMIN D PO, Take 10,000 Units by mouth daily., Disp: , Rfl:    gabapentin (NEURONTIN) 300 MG capsule, TAKE 3 CAPSULES(900 MG) BY MOUTH THREE TIMES DAILY AS NEEDED FOR PAIN, Disp: 270 capsule, Rfl: 5  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[place]) 04/15/2016   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   Sciatica of right side 07/27/2019   Thyroid disease     PAST SURGICAL HISTORY: Past Surgical History:  Procedure Laterality Date   ANKLE ARTHROSCOPY Right 2000   COLONOSCOPY  05/01/2020   KNEE ARTHROSCOPY Left 1988   KNEE ARTHROSCOPY Right 1999   LAPAROSCOPY  2001   NASAL POLYP SURGERY  2006   TONSILLECTOMY AND ADENOIDECTOMY  1981   UPPER GASTROINTESTINAL ENDOSCOPY  05/01/2020    FAMILY HISTORY: Family History  Problem Relation Age of Onset   Alcohol abuse Father    Drug abuse Father     Thyroid cancer Maternal Grandmother        With mets to bone   Heart disease Paternal Grandfather    Stroke Paternal Grandfather    Clotting disorder Mother        lupus antibody    Alcohol abuse Mother    Drug abuse Mother    Other Mother        "esophageal erosion"   Kidney disease Maternal Grandfather    Drug abuse Brother    Alcohol abuse Brother    Heart disease Brother    Colon cancer Neg Hx    Esophageal cancer Neg Hx    Pancreatic cancer Neg Hx    Liver disease Neg Hx    Stomach cancer Neg Hx     SOCIAL HISTORY:  Social History   Socioeconomic History   Marital status: Married    Spouse name: Debroah Baller   Number of children: 1   Years of education: BS   Highest education level: Not on file  Occupational History   Occupation: not currently working   Tobacco Use   Smoking status: Never   Smokeless tobacco: Never   Tobacco comments:    Tried a cigarette once  Vaping Use   Vaping Use: Never used  Substance and Sexual Activity   Alcohol use: Not Currently    Alcohol/week: 0.0 standard drinks    Comment: previously occasional socially    Drug use: No   Sexual activity: Yes    Partners: Male    Birth control/protection: I.U.D.  Other Topics Concern   Not on file  Social History Narrative   Lives with husband and son   Caffeine use: No soda, a few cups of coffee or tea per month   Right handed    Social Determinants of Health   Financial Resource Strain: Not on file  Food Insecurity: Not on file  Transportation Needs: Not on file  Physical Activity: Not on file  Stress: Not on file  Social Connections: Not on file  Intimate Partner Violence: Not on file     PHYSICAL EXAM  Vitals:   05/02/21 0815  BP: 137/86  Pulse: 74  Weight: 151 lb (68.5 kg)  Height: 5'  1.5" (1.562 m)    Body mass index is 28.07 kg/m.   General: The patient is well-developed and well-nourished and in no acute distress  HEENT:  Head is [place]/AT.  Sclera are anicteric.     Musculoskeletal:  Back is fairly nontender with good range of motion.  She has mild paraspinal tenderness.  Neurologic Exam  Mental status: The patient is alert and oriented x 3 at the time of the examination. The patient has apparent normal recent and remote memory, with an apparently normal attention span and concentration ability.   Speech is normal.  Cranial nerves: Extraocular movements are full   facial strength is normal.  Trapezius and sternocleidomastoid strength is normal. No dysarthria is noted.  . No obvious hearing deficits are noted.  Motor:  Muscle bulk is normal.   Tone is normal. Strength is  5 / 5 in all 4 extremities except 4+/5 triceps.   Sensory: Sensory testing is intact to pinprick, soft touch and vibration sensation in the arms but she had reduced sensation in the right leg relative to the left..  Coordination: Cerebellar testing reveals good finger-nose-finger and heel-to-shin bilaterally.  Gait and station: Station is normal.   Gait is normal. Tandem gait is mildly wide. Romberg is negative.   Reflexes: Deep tendon reflexes are symmetric and increased.  DTRs are 3+ at the knees and ankles and 2+ in the arms.    DIAGNOSTIC DATA (LABS, IMAGING, TESTING) - I reviewed patient records, labs, notes, testing and imaging myself where available.  Lab Results  Component Value Date   WBC 5.7 11/21/2020   HGB 12.4 11/21/2020   HCT 37.3 11/21/2020   MCV 97.9 11/21/2020   PLT 333 11/21/2020      Component Value Date/Time   NA 139 11/21/2020 1006   K 4.7 11/21/2020 1006   CL 101 11/21/2020 1006   CO2 31 11/21/2020 1006   GLUCOSE 76 11/21/2020 1006   BUN 14 11/21/2020 1006   CREATININE 0.92 11/21/2020 1006   CALCIUM 9.1 11/21/2020 1006   PROT 6.9 11/21/2020 1006   ALBUMIN 4.2 10/09/2016 1604   AST 19 11/21/2020 1006   ALT 14 11/21/2020 1006   ALKPHOS 55 10/09/2016 1604   BILITOT 0.7 11/21/2020 1006   GFRNONAA 94 08/07/2020 0000   GFRAA 109 08/07/2020 0000    Lab Results  Component Value Date   CHOL 165 05/18/2019   HDL 58 05/18/2019   LDLCALC 93 05/18/2019   TRIG 53 05/18/2019   CHOLHDL 2.8 05/18/2019   Lab Results  Component Value Date   HGBA1C 5.1 05/18/2019   Lab Results  Component Value Date   IOMBTDHR41 638 05/18/2020   Lab Results  Component Value Date   TSH 3.57 12/26/2020       ASSESSMENT AND PLAN  C7 radiculopathy - Plan: DG INJECT DIAG/THERA/INC NEEDLE/CATH/PLC EPI/CERV/THOR W/IMG  Neck pain  Sciatica of right side  1.   Gabapentin 600 mg 4 times daily .   Change NSAID to etodolac 2.   Continue exercises. 3.   ESI for right C7 radic --- if not better recheck MRI and consder referral to surgery.   4.   Rtc 6 months Daymion Nazaire A. Felecia Shelling, MD, [facility] 06/26/3644, 8:03 AM Certified in Neurology, Clinical Neurophysiology, Sleep Medicine and Neuroimaging  [facility] [address] [place] [employer], [place] [address] [phone]

## 2021-05-03 ENCOUNTER — Ambulatory Visit: Payer: BC Managed Care – PPO | Admitting: Gastroenterology

## 2021-05-03 ENCOUNTER — Other Ambulatory Visit: Payer: Self-pay

## 2021-05-03 ENCOUNTER — Encounter: Payer: Self-pay | Admitting: Gastroenterology

## 2021-05-03 VITALS — BP 122/82 | HR 83 | Ht 61.5 in | Wt 153.4 lb

## 2021-05-03 DIAGNOSIS — K580 Irritable bowel syndrome with diarrhea: Secondary | ICD-10-CM | POA: Diagnosis not present

## 2021-05-03 DIAGNOSIS — R109 Unspecified abdominal pain: Secondary | ICD-10-CM | POA: Diagnosis not present

## 2021-05-03 DIAGNOSIS — K2100 Gastro-esophageal reflux disease with esophagitis, without bleeding: Secondary | ICD-10-CM | POA: Diagnosis not present

## 2021-05-03 NOTE — Patient Instructions (Addendum)
If you are age 47 or older, your body mass index should be between 23-30. Your Body mass index is 28.51 kg/m. If this is out of the aforementioned range listed, please consider follow up with your Primary Care Provider.  If you are age 37 or younger, your body mass index should be between 19-25. Your Body mass index is 28.51 kg/m. If this is out of the aformentioned range listed, please consider follow up with your Primary Care Provider.   __________________________________________________________  The [facility] providers would like to encourage you to use [facility] to communicate with providers for non-urgent requests or questions.  Due to long hold times on the telephone, sending your provider a message by [facility] may be a faster and more efficient way to get a response.  Please allow 48 business hours for a response.  Please remember that this is for non-urgent requests.    Due to recent changes in healthcare laws, you may see the results of your imaging and laboratory studies on MyChart before your provider has had a chance to review them.  We understand that in some cases there may be results that are confusing or concerning to you. Not all laboratory results come back in the same time frame and the provider may be waiting for multiple results in order to interpret others.  Please give [place] 48 hours in order for your provider to thoroughly review all the results before contacting the office for clarification of your results.   Please go to the lab on the 2nd floor [address] before you leave the office today.   Please start on a fiber supplement: ex: Benefiber or Citracel  Return to the clinic in 3 months, we do not have our schedule out, please call in 06/2021 for an appointment [phone].  Thank you for choosing me and [facility].  Vito Cirigliano, D.O.

## 2021-05-03 NOTE — Progress Notes (Signed)
Chief Complaint:    Abdominal pain, diarrhea  GI History: 46 year old female with a history of hepatic steatosis, anxiety, headaches, depression, GERD, hyperlipidemia, chronic constipation, hypothyroidism, follows in the GI clinic for the following:   1) GERD: History of enamel erosion but no typical reflux symptoms until 02/2020 (heartburn, upper abdominal pain, early satiety) -02/2020: Normal CBC, CMP.  H. pylori breath test negative.  Started on Pepcid -03/2020: Changed Pepcid to omeprazole, discontinued all NSAIDs -04/2020: EGD: 2 cm tongue of salmon-colored mucosa (path negative for Barrett's), Hill grade 1, minimal non-H. pylori gastritis, normal duodenum   2) IBS: Was previously predominantly constipation and well controlled with OTC mag supplements, but in 2022 started developing watery, nonbloody diarrhea with associated abdominal pain/cramping.  Now symptoms alternating, but more so diarrhea predominant. - 07/2020: Negative/normal GI PCR panel, calprotectin - 11/09/2020: CT AP: Gas and stool throughout the colon, otherwise normal - 11/21/2020: Normal CBC, CMP.  TSH 5.22 with repeat 3.57 in 12/2020   Endoscopic History: -EGD (04/2020): 2 cm tongue of salmon-colored mucosa (path negative for Barrett's),; mucosa, Hill grade 1, minimal non-H. pylori gastritis, normal duodenum -Colonoscopy (04/2020): 1 SSP, 1 traditional serrated adenoma, 3 benign polyps.  Sigmoid/cecal diverticulosis, grade 2 internal hemorrhoids.  Repeat in 3 years  HPI:     Patient is a 46 y.o. female presenting to the Gastroenterology Clinic for follow-up.  Last seen by me on 10/22/2020.  Main issue at that time was diarrhea and upper abdominal pain.  Symptoms had improved after starting Pepto-Bismol and psyllium along with OTC digestive enzyme.  Reflux symptoms were "60% better" with Prilosec 20 mg/day.  Made empiric change to Protonix 40 mg/day and FD guard.  Evaluated abdominal pain with CT which was unremarkable.   Recommended HIDA scan which was never completed.  Never went for pancreatic elastase testing.  Today, states her abdominal pain is improving.  Still with postprandial diarrhea and fecal urgency.  Symptoms are intermittent, and can last for a few days at a time.  No clear dietary triggers.  Not much change with as needed Imodium.  Reflux symptoms seem largely well controlled with Protonix 40 mg/day and occasional Rolaids prn breakthrough.  She stopped taking Cymbalta at some point due to ineffectiveness.  Was seen in the Neurology Clinic yesterday for C7 radiculopathy.   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 Takes 0-6 per day depending on her pain level     aluminum-magnesium hydroxide-simethicone (MAALOX) 200-200-20 MG/5ML SUSP Take 20 mLs by mouth 3 (three) times daily.     AMBULATORY NON FORMULARY MEDICATION 1 Scoop daily. Medication Name: Clear Vite-PSF (k-84) Enzyme based, multivitamin, mineral & herbal dietary supplement powder     AMBULATORY NON FORMULARY MEDICATION 1 tablet at bedtime. Medication Name: Marny Lowenstein dietary supplement with proteolytiic enzymes for  the gut     AMBULATORY NON FORMULARY MEDICATION 2 tablets daily in the afternoon. Medication Name: Drenamin dietary supplement 3700 for adrenal support     AMBULATORY NON FORMULARY MEDICATION 3  tablets daily in the afternoon. Medication Name: A-F betafood dietary supplement 0825 for gallbladder     AMBULATORY NON FORMULARY MEDICATION 90 ml of lidocaine 2% 90 ml dicyclomine 10/mg per 45ml 270 ml of maalox  Take 5 ml every 12 hours as needed 450 mL 0   Bismuth Subsalicylate (PEPTO-BISMOL PO) Take by mouth as needed.     Ca Carbonate-Mag Hydroxide (ROLAIDS PO) Take 1 each by mouth as needed.     dicyclomine (BENTYL) 10 MG capsule Take 1 capsule (10 mg total) by mouth 4 (four) times daily -  before meals and at bedtime. 120 capsule 2   etodolac (LODINE) 400 MG tablet Take 1 tablet (400 mg total) by mouth 2 (two) times daily. 60 tablet 5   gabapentin (NEURONTIN) 300 MG capsule TAKE 3 CAPSULES(900 MG) BY MOUTH THREE TIMES DAILY AS NEEDED FOR PAIN 270 capsule 5   hydrOXYzine (ATARAX/VISTARIL) 50 MG tablet Take 1-2 tablets (50-100 mg total) by mouth every 6 (six) hours as needed for anxiety. 120 tablet 0   ibuprofen (ADVIL) 200 MG tablet Take 200 mg by mouth every 6 (six) hours as needed.     levothyroxine (SYNTHROID) 75 MCG tablet TAKE 1 TABLET DAILY ON EMPTY STOMACH WITH ONLY WATER FOR 30 MINUTES & NO ANTACID MEDS, CALCIUM OR MAGNESIUM FOR 4 HOURS AND AVOID BIOTIN 90 tablet 3   LO LOESTRIN FE 1 MG-10 MCG / 10 MCG tablet Take 1 tablet by mouth daily.     MAGNESIUM PO Take 400 mg by mouth daily.     Multiple Vitamins-Minerals (MULTIVITAMIN ADULTS PO) Take 1 tablet by mouth daily.     Omega 3-6-9 Fatty Acids (OMEGA-3-6-9 PO) Take 1 Dose by mouth daily.     promethazine (PHENERGAN) 25 MG tablet TAKE 1 TABLET BY MOUTH EVERY 6 HOURS AS NEEDED FOR NAUSEA OR HEADACHE 30 tablet 0   psyllium (METAMUCIL) 58.6 % powder Take 1 packet by mouth daily as needed.     temazepam (RESTORIL) 30 MG capsule TAKE 1 CAPSULE BY MOUTH EVERY NIGHT AT BEDTIME AS NEEDED FOR SLEEP. 30 capsule 0   Valerian 100 MG CAPS OTC taking PRN anxiety 840 capsule 0   VITAMIN D PO Take 10,000 Units by mouth daily.     No current  facility-administered medications for this visit.    Physical Exam:     There were no vitals taken for this visit.  GENERAL:  Pleasant female in NAD PSYCH: : Cooperative, normal affect Musculoskeletal:  Normal muscle tone, normal strength NEURO: Alert and oriented x 3, no focal neurologic deficits   IMPRESSION and PLAN:    1) IBS 2) Abdominal cramping Had a long discussion today regarding the pathophysiology of IBS at length.  Plan for the following:  - Low FODMAP diet.  Provided with handout and detailed instruction today -Start fiber supplement such as Benefiber or Citrucel - Check pancreatic elastase - Okay to use Bentyl prn abdominal pain/cramping.  She was trying to use that to manage diarrhea - She is also on multiple supplements which could be contributing to symptoms - Unsure if stopping her Cymbalta has also had negative effect on IBS.  To follow-up with PCM  3) GERD - Well-controlled on Protonix - Continue antireflux lifestyle/dietary modifications   - RTC in 3  months or sooner prn   I spent 30 minutes of time, including in depth chart review, independent review of results as outlined above, communicating results with the patient directly, face-to-face time with the patient, coordinating care, and ordering studies and medications as appropriate, and documentation.         Lavena Bullion ,DO, FACG 05/03/2021, 2:55 PM

## 2021-05-06 DIAGNOSIS — N951 Menopausal and female climacteric states: Secondary | ICD-10-CM | POA: Diagnosis not present

## 2021-05-14 ENCOUNTER — Ambulatory Visit
Admission: RE | Admit: 2021-05-14 | Discharge: 2021-05-14 | Disposition: A | Discharge status: Home / Self Care | Payer: BC Managed Care – PPO | Source: Ambulatory Visit | Attending: Neurology | Admitting: Neurology

## 2021-05-14 ENCOUNTER — Other Ambulatory Visit: Payer: Self-pay

## 2021-05-14 DIAGNOSIS — M5412 Radiculopathy, cervical region: Secondary | ICD-10-CM

## 2021-05-14 DIAGNOSIS — M47812 Spondylosis without myelopathy or radiculopathy, cervical region: Secondary | ICD-10-CM | POA: Diagnosis not present

## 2021-05-14 MED ORDER — TRIAMCINOLONE ACETONIDE 40 MG/ML IJ SUSP (RADIOLOGY)
60.0000 mg | Freq: Once | INTRAMUSCULAR | Status: AC
  Administered 2021-05-14: 60 mg via EPIDURAL

## 2021-05-14 MED ORDER — IOPAMIDOL (ISOVUE-M 300) INJECTION 61%
1.0000 mL | Freq: Once | INTRAMUSCULAR | Status: AC
  Administered 2021-05-14: 1 mL via EPIDURAL

## 2021-05-14 NOTE — Discharge Instructions (Signed)

## 2021-05-20 ENCOUNTER — Encounter: Payer: BC Managed Care – PPO | Admitting: Adult Health

## 2021-05-22 ENCOUNTER — Encounter: Payer: BC Managed Care – PPO | Admitting: Adult Health

## 2021-05-23 DIAGNOSIS — Q120 Congenital cataract: Secondary | ICD-10-CM | POA: Diagnosis not present

## 2021-05-23 DIAGNOSIS — H43393 Other vitreous opacities, bilateral: Secondary | ICD-10-CM | POA: Diagnosis not present

## 2021-05-23 DIAGNOSIS — D3131 Benign neoplasm of right choroid: Secondary | ICD-10-CM | POA: Diagnosis not present

## 2021-05-23 DIAGNOSIS — H35363 Drusen (degenerative) of macula, bilateral: Secondary | ICD-10-CM | POA: Diagnosis not present

## 2021-06-03 DIAGNOSIS — M25551 Pain in right hip: Secondary | ICD-10-CM | POA: Diagnosis not present

## 2021-06-03 DIAGNOSIS — M9902 Segmental and somatic dysfunction of thoracic region: Secondary | ICD-10-CM | POA: Diagnosis not present

## 2021-06-03 DIAGNOSIS — M9905 Segmental and somatic dysfunction of pelvic region: Secondary | ICD-10-CM | POA: Diagnosis not present

## 2021-06-03 DIAGNOSIS — M9901 Segmental and somatic dysfunction of cervical region: Secondary | ICD-10-CM | POA: Diagnosis not present

## 2021-06-03 DIAGNOSIS — M7918 Myalgia, other site: Secondary | ICD-10-CM | POA: Diagnosis not present

## 2021-06-03 DIAGNOSIS — M9903 Segmental and somatic dysfunction of lumbar region: Secondary | ICD-10-CM | POA: Diagnosis not present

## 2021-06-04 ENCOUNTER — Other Ambulatory Visit: Payer: BC Managed Care – PPO

## 2021-06-04 ENCOUNTER — Other Ambulatory Visit: Payer: Self-pay | Admitting: Adult Health

## 2021-06-04 DIAGNOSIS — K580 Irritable bowel syndrome with diarrhea: Secondary | ICD-10-CM

## 2021-06-06 ENCOUNTER — Other Ambulatory Visit: Payer: Self-pay

## 2021-06-06 ENCOUNTER — Encounter: Payer: Self-pay | Admitting: Adult Health

## 2021-06-06 ENCOUNTER — Ambulatory Visit (INDEPENDENT_AMBULATORY_CARE_PROVIDER_SITE_OTHER): Payer: BC Managed Care – PPO | Admitting: Adult Health

## 2021-06-06 VITALS — BP 122/80 | HR 68 | Temp 97.7°F | Ht 61.25 in | Wt 154.4 lb

## 2021-06-06 DIAGNOSIS — Z0000 Encounter for general adult medical examination without abnormal findings: Secondary | ICD-10-CM | POA: Diagnosis not present

## 2021-06-06 DIAGNOSIS — Z1389 Encounter for screening for other disorder: Secondary | ICD-10-CM

## 2021-06-06 DIAGNOSIS — Z13220 Encounter for screening for lipoid disorders: Secondary | ICD-10-CM | POA: Diagnosis not present

## 2021-06-06 DIAGNOSIS — G4700 Insomnia, unspecified: Secondary | ICD-10-CM

## 2021-06-06 DIAGNOSIS — E559 Vitamin D deficiency, unspecified: Secondary | ICD-10-CM

## 2021-06-06 DIAGNOSIS — F419 Anxiety disorder, unspecified: Secondary | ICD-10-CM

## 2021-06-06 DIAGNOSIS — Z6828 Body mass index (BMI) 28.0-28.9, adult: Secondary | ICD-10-CM

## 2021-06-06 DIAGNOSIS — M4802 Spinal stenosis, cervical region: Secondary | ICD-10-CM

## 2021-06-06 DIAGNOSIS — Z79899 Other long term (current) drug therapy: Secondary | ICD-10-CM | POA: Diagnosis not present

## 2021-06-06 DIAGNOSIS — F325 Major depressive disorder, single episode, in full remission: Secondary | ICD-10-CM

## 2021-06-06 DIAGNOSIS — Z0001 Encounter for general adult medical examination with abnormal findings: Secondary | ICD-10-CM

## 2021-06-06 DIAGNOSIS — E785 Hyperlipidemia, unspecified: Secondary | ICD-10-CM

## 2021-06-06 DIAGNOSIS — Z131 Encounter for screening for diabetes mellitus: Secondary | ICD-10-CM

## 2021-06-06 DIAGNOSIS — Z130 Encounter for screening for diseases of the blood and blood-forming organs and certain disorders involving the immune mechanism: Secondary | ICD-10-CM

## 2021-06-06 DIAGNOSIS — E063 Autoimmune thyroiditis: Secondary | ICD-10-CM

## 2021-06-06 DIAGNOSIS — F2081 Schizophreniform disorder: Secondary | ICD-10-CM

## 2021-06-06 DIAGNOSIS — Z13228 Encounter for screening for other metabolic disorders: Secondary | ICD-10-CM

## 2021-06-06 NOTE — Progress Notes (Signed)
Complete Physical ? ?Assessment and Plan: ? ?Diagnoses and all orders for this visit: ? ?Encounter for Annual Physical Exam with abnormal findings ?Due annually  ?Health Maintenance reviewed ?Healthy lifestyle reviewed and goals set ? ?Follow up with GYN  ?Declines influenza vaccine  ? ?Schizophreniform disorder ([place]) ?Previously managed by Dr. Stephanie Acre; no longer on medications, declined further follow up ? ?Hypothyroidism, unspecified type ?continue medications the same pending lab results ?reminded to take on an empty stomach 30-78mns before food.  ?-     TSH ? ?Vitamin D deficiency ?Continue with supplementation for goal of 70-100 ?-     VITAMIN D 25 Hydroxy (Vit-D Deficiency, Fractures) ? ?Hyperlipdemia ?Much improved with weight loss and lifestyle changes ?Continue low fat diet and exercise ?-     Lipid panel  ? ?Screening for diabetes mellitus ?-     Hemoglobin A1c  ? ?Medication management ?-     CBC with Differential/Platelet ?-     CMP/GFR ?-     Urinalysis w microscopic + reflex cultur ?-     Magnesium  ? ?BMI 28 ?Continue to recommend diet heavy in fruits and veggies and low in animal meats, cheeses, and dairy products, appropriate calorie intake ?Discuss exercise recommendations routinely ?Continue to monitor weight at each visit ? ?Anxiety ?Currently off of meds per patient preference; using CBD ?Stress management techniques discussed, increase water, good sleep hygiene discussed, increase exercise, and increase veggies.  ? ?Headaches ?she reports well managed by dietary modification ?Declines further evaluation or management at this time ? ?Cervical spinal stenosis ?- neuro following ? ?Diverticulosis ?Bowel management reviewed; add fiber if needed ? ?History adenomatous colon polyps ?Next colonoscopy due 04/2022; bowel management; add fiber supplement if needed ? ? ? ?No orders of the defined types were placed in this encounter. ? ? ? ?Discussed med's effects and SE's. Screening labs and tests as  requested with regular follow-up as recommended. ?Over 40 minutes of exam, counseling, chart review, and complex, high level critical decision making was performed this visit.  ? ?Future Appointments  ?Date Time Provider DRancho [place] ?11/04/2021  8:30 AM Sater, RNanine Means MD [facility] None  ?06/11/2022  3:00 PM CLiane Comber NP [facility] None  ? ? ?HPI  ?46y.o. female  presents for a complete physical and follow up for has Sch[place]iform disorder [place]; Hypothyroid; Vitamin D deficiency; BMI 24.0-24.9, adult; Early dry stage nonexudative age-related macular degeneration of both eyes; Insomnia; Hyperlipidemia; [place] depression [place]; Neck pain; Sciatica of right side; Hyperreflexia; Mid back pain; Family history of MS (multiple sclerosis); Pain of right lower extremity; Right arm pain; Anxiety; and History of adenomatous polyp of colon on their problem list.  ? ?[place]  ?She is an eChief Financial Officerby trade, currently working part time in [employer], research (medical) she reports loves her job, much lower stress. [facility], on HBC, getting mammogram annually at breast center. ? ?She was previously followed by Dr. AStephanie Acrefor schizophreniform disorder, and was seeing FFrancee Piccolo(counsellor) regularly as well in 2018, but reports she has tapered off of medications and was doing well. She does not believe the diagnoses was accurate.  ? ?She was referred to neuro for neck and back pain, MRI in 2021 showed cervical spinal stenosis mild/mod at C6-7 with radicular sx of extremities intermittently, R>L. No surgery recommended at this point, following up q654my neuro, managing with tylenol, gabapentin, CBD oil, natural supplements.  ? ?  She was having anxiety, buspar not helpful, was taking vistaril/PRN alprazolam but has tapered off.  ? ?She is prescribed temazepam PRN for insomnia.   ? ?BMI is Body mass index is 28.94 kg/m?., she is working on diet and  exercise. She walks most days, does yoga. She does watch her diet, has lost weight previously. ?Drinks mainly water, aims 75-100 fluid ounces of water daily.  ?Has stopped coffee, avoiding caffeine, has cut out alcohol ?Wt Readings from Last 3 Encounters:  ?06/06/21 154 lb 6.4 oz (70 kg)  ?05/03/21 153 lb 6 oz (69.6 kg)  ?05/02/21 151 lb (68.5 kg)  ? ?Her blood pressure has been controlled at home (110/70), today their BP is BP: 122/80 ?She does workout. She denies chest pain, shortness of breath, dizziness.  ? ?She is not on cholesterol medication. Her cholesterol is not at goal. The cholesterol last visit was:   ?Lab Results  ?Component Value Date  ? CHOL 165 05/18/2019  ? HDL 58 05/18/2019  ? [place] 93 05/18/2019  ? TRIG 53 05/18/2019  ? CHOLHDL 2.8 05/18/2019  ? ? Last A1C in the office was:  ?Lab Results  ?Component Value Date  ? HGBA1C 5.1 05/18/2019  ? ?She is on thyroid medication. Her medication was not changed last visit. Take 75 mcg daily, 1/2 tab on Mon/Thurs.   ?Lab Results  ?Component Value Date  ? TSH 3.57 12/26/2020  ? ?Last GFR: ?Lab Results  ?Component Value Date  ? GFRNONAA 94 08/07/2020  ? ?Patient is on Vitamin D supplement, admits not as regularly ?Lab Results  ?Component Value Date  ? VD25OH 61 11/21/2020  ?   ? ? ? ? ?Current Medications:  ?Current Outpatient Medications on File Prior to Visit  ?Medication Sig Dispense Refill  ? acetaminophen (TYLENOL) 500 MG tablet Take 500 mg by mouth every 6 (six) hours as needed. Takes 0-6 per day depending on her pain level    ? aluminum-magnesium hydroxide-simethicone (MAALOX) 200-200-20 MG/5ML SUSP Take 20 mLs by mouth 3 (three) times daily.    ? AMBULATORY NON FORMULARY MEDICATION 1 Scoop daily. Medication Name: Clear Vite-PSF (k-84) Enzyme based, multivitamin, mineral & herbal dietary supplement powder    ? AMBULATORY NON FORMULARY MEDICATION 1 tablet at bedtime. Medication Name: Marny Lowenstein dietary supplement with proteolytiic enzymes for the  gut    ? AMBULATORY NON FORMULARY MEDICATION 2 tablets daily in the afternoon. Medication Name: [place] 7939 for adrenal support    ? AMBULATORY NON FORMULARY MEDICATION 3 tablets daily in the afternoon. Medication Name: A-F betafood dietary supplement 0825 for gallbladder    ? Bismuth Subsalicylate (PEPTO-BISMOL PO) Take by mouth as needed.    ? Ca Carbonate-Mag Hydroxide (ROLAIDS PO) Take 1 each by mouth as needed.    ? etodolac (LODINE) 400 MG tablet Take 1 tablet (400 mg total) by mouth 2 (two) times daily. 60 tablet 5  ? gabapentin (NEURONTIN) 300 MG capsule TAKE 3 CAPSULES(900 MG) BY MOUTH THREE TIMES DAILY AS NEEDED FOR PAIN 270 capsule 5  ? Glucosamine-Chondroitin 1500-1200 MG/30ML LIQD Take by mouth.    ? levothyroxine (SYNTHROID) 75 MCG tablet TAKE 1 TABLET DAILY ON EMPTY STOMACH WITH ONLY WATER FOR 30 MINUTES & NO ANTACID MEDS, CALCIUM OR MAGNESIUM FOR 4 HOURS AND AVOID BIOTIN 90 tablet 3  ? LO LOESTRIN FE 1 MG-10 MCG / 10 MCG tablet Take 1 tablet by mouth daily.    ? MAGNESIUM PO Take 400 mg by mouth daily.    ?  Multiple Vitamins-Minerals (MULTIVITAMIN ADULTS PO) Take 1 tablet by mouth daily.    ? Omega 3-6-9 Fatty Acids (OMEGA-3-6-9 PO) Take 1 Dose by mouth daily.    ? psyllium (METAMUCIL) 58.6 % powder Take 1 packet by mouth daily as needed.    ? temazepam (RESTORIL) 30 MG capsule TAKE 1 CAPSULE BY MOUTH EVERY NIGHT AT BEDTIME AS NEEDED FOR SLEEP 30 capsule 0  ? TURMERIC PO Take by mouth.    ? VITAMIN D PO Take 10,000 Units by mouth daily.    ? dicyclomine (BENTYL) 10 MG capsule Take 1 capsule (10 mg total) by mouth 4 (four) times daily -  before meals and at bedtime. (Patient not taking: Reported on 06/06/2021) 120 capsule 2  ? hydrOXYzine (ATARAX/VISTARIL) 50 MG tablet Take 1-2 tablets (50-100 mg total) by mouth every 6 (six) hours as needed for anxiety. (Patient not taking: Reported on 06/06/2021) 120 tablet 0  ? ibuprofen (ADVIL) 200 MG tablet Take 200 mg by mouth every 6 (six)  hours as needed. (Patient not taking: Reported on 06/06/2021)    ? promethazine (PHENERGAN) 25 MG tablet TAKE 1 TABLET BY MOUTH EVERY 6 HOURS AS NEEDED FOR NAUSEA OR HEADACHE (Patient not taking: Reported on 3

## 2021-06-07 LAB — COMPLETE METABOLIC PANEL WITH GFR
AG Ratio: 1.6 (calc) (ref 1.0–2.5)
ALT: 13 U/L (ref 6–29)
AST: 16 U/L (ref 10–35)
Albumin: 4.2 g/dL (ref 3.6–5.1)
Alkaline phosphatase (APISO): 36 U/L (ref 31–125)
BUN: 8 mg/dL (ref 7–25)
CO2: 25 mmol/L (ref 20–32)
Calcium: 9.2 mg/dL (ref 8.6–10.2)
Chloride: 100 mmol/L (ref 98–110)
Creat: 0.86 mg/dL (ref 0.50–0.99)
Globulin: 2.7 g/dL (calc) (ref 1.9–3.7)
Glucose, Bld: 91 mg/dL (ref 65–99)
Potassium: 4.6 mmol/L (ref 3.5–5.3)
Sodium: 133 mmol/L — ABNORMAL LOW (ref 135–146)
Total Bilirubin: 0.7 mg/dL (ref 0.2–1.2)
Total Protein: 6.9 g/dL (ref 6.1–8.1)
eGFR: 84 mL/min/{1.73_m2} (ref >=60)

## 2021-06-07 LAB — CBC WITH DIFFERENTIAL/PLATELET
Absolute Monocytes: 511 cells/uL (ref 200–950)
Basophils Absolute: 41 cells/uL (ref 0–200)
Basophils Relative: 0.6 %
Eosinophils Absolute: 235 cells/uL (ref 15–500)
Eosinophils Relative: 3.4 %
HCT: 35.6 % (ref 35.0–45.0)
Hemoglobin: 12.3 g/dL (ref 11.7–15.5)
Lymphs Abs: 1863 cells/uL (ref 850–3900)
MCH: 33.9 pg — ABNORMAL HIGH (ref 27.0–33.0)
MCHC: 34.6 g/dL (ref 32.0–36.0)
MCV: 98.1 fL (ref 80.0–100.0)
MPV: 9.5 fL (ref 7.5–12.5)
Monocytes Relative: 7.4 %
Neutro Abs: 4250 cells/uL (ref 1500–7800)
Neutrophils Relative %: 61.6 %
Platelets: 345 10*3/uL (ref 140–400)
RBC: 3.63 10*6/uL — ABNORMAL LOW (ref 3.80–5.10)
RDW: 12.6 % (ref 11.0–15.0)
Total Lymphocyte: 27 %
WBC: 6.9 10*3/uL (ref 3.8–10.8)

## 2021-06-07 LAB — HEMOGLOBIN A1C
Hgb A1c MFr Bld: 5.2 % of total Hgb (ref <5.7)
Mean Plasma Glucose: 103 mg/dL
eAG (mmol/L): 5.7 mmol/L

## 2021-06-07 LAB — MAGNESIUM: Magnesium: 2.1 mg/dL (ref 1.5–2.5)

## 2021-06-07 LAB — LIPID PANEL
Cholesterol: 210 mg/dL — ABNORMAL HIGH (ref <200)
HDL: 63 mg/dL (ref >=50)
LDL Cholesterol (Calc): 127 mg/dL (calc) — ABNORMAL HIGH
Non-HDL Cholesterol (Calc): 147 mg/dL (calc) — ABNORMAL HIGH (ref <130)
Total CHOL/HDL Ratio: 3.3 (calc) (ref <5.0)
Triglycerides: 92 mg/dL (ref <150)

## 2021-06-07 LAB — PANCREATIC ELASTASE, FECAL: Pancreatic Elastase, Fecal: >500 ug Elast./g (ref >200)

## 2021-06-07 LAB — URINALYSIS, ROUTINE W REFLEX MICROSCOPIC
Bilirubin Urine: NEGATIVE
Glucose, UA: NEGATIVE
Hgb urine dipstick: NEGATIVE
Ketones, ur: NEGATIVE
Leukocytes,Ua: NEGATIVE
Nitrite: NEGATIVE
Protein, ur: NEGATIVE
Specific Gravity, Urine: 1.004 (ref 1.001–1.035)
pH: 6 (ref 5.0–8.0)

## 2021-06-07 LAB — TSH: TSH: 1.14 mIU/L

## 2021-06-07 LAB — VITAMIN D 25 HYDROXY (VIT D DEFICIENCY, FRACTURES): Vit D, 25-Hydroxy: 46 ng/mL (ref 30–100)

## 2021-06-14 ENCOUNTER — Encounter: Payer: Self-pay | Admitting: Neurology

## 2021-06-18 ENCOUNTER — Encounter: Payer: Self-pay | Admitting: Adult Health

## 2021-06-18 ENCOUNTER — Other Ambulatory Visit: Payer: Self-pay | Admitting: Neurology

## 2021-06-18 ENCOUNTER — Ambulatory Visit: Payer: BC Managed Care – PPO | Admitting: Adult Health

## 2021-06-18 ENCOUNTER — Other Ambulatory Visit: Payer: Self-pay

## 2021-06-18 VITALS — BP 122/76 | HR 71 | Temp 97.7°F | Wt 155.0 lb

## 2021-06-18 DIAGNOSIS — R350 Frequency of micturition: Secondary | ICD-10-CM

## 2021-06-18 DIAGNOSIS — Z113 Encounter for screening for infections with a predominantly sexual mode of transmission: Secondary | ICD-10-CM | POA: Diagnosis not present

## 2021-06-18 DIAGNOSIS — R32 Unspecified urinary incontinence: Secondary | ICD-10-CM

## 2021-06-18 MED ORDER — GABAPENTIN 400 MG PO CAPS: 800.0000 mg | ORAL_CAPSULE | Freq: Four times a day (QID) | ORAL | 0 refills | Status: DC | PRN

## 2021-06-18 MED ORDER — MIRABEGRON ER 25 MG PO TB24: ORAL_TABLET | ORAL | 0 refills | Status: DC

## 2021-06-18 NOTE — Patient Instructions (Signed)
Please try reducing night time sedating medications ? ?We will check urine and vagina for infections - this may take a few days for all the results to come back  ? ?If any changes in that time please let me know ? ?If everything is negative - we will try myrbetriq - 1st week 1 tab 25 mg daily, then second wee take 2 tabs (50 mg) daily ? ?Let me know if this helps/doesn't help ? ?If no improvement would suggest GYN follow up for a full pelvic exam  ? ? ? ? ?Urinary Incontinence ?Urinary incontinence refers to a condition in which a person is unable to control where and when to pass urine. A person with this condition will urinate involuntarily. This means that the person urinates when he or she does not mean to. ?What are the causes? ?This condition may be caused by: ?Medicines. ?Infections. ?Constipation. ?Overactive bladder muscles. ?Weak bladder muscles. ?Weak pelvic floor muscles. These muscles provide support for the bladder, intestine, and, in women, the uterus. ?Enlarged prostate in men. The prostate is a gland near the bladder. When it gets too big, it can pinch the urethra. With the urethra blocked, the bladder can weaken and lose the ability to empty properly. ?Surgery. ?Emotional factors, such as anxiety, stress, or post-traumatic stress disorder (PTSD). ?Spinal cord injury, nerve injury, or other neurological conditions. ?Pelvic organ prolapse. This happens in women when organs move out of place and into the vagina. This movement can prevent the bladder and urethra from working properly. ?What increases the risk? ?The following factors may make you more likely to develop this condition: ?Age. The older you are, the higher the risk. ?Obesity. ?Being physically inactive. ?Pregnancy and childbirth. ?Menopause. ?Diseases that affect the nerves or spinal cord. ?Long-term, or chronic, coughing. This can increase pressure on the bladder and pelvic floor muscles. ?What are the signs or symptoms? ?Symptoms may  vary depending on the type of urinary incontinence you have. They include: ?A sudden urge to urinate, and passing urine involuntarily before you can get to a bathroom (urge incontinence). ?Suddenly passing urine when doing activities that force urine to pass, such as coughing, laughing, exercising, or sneezing (stress incontinence). ?Needing to urinate often but urinating only a small amount, or constantly dribbling urine (overflow incontinence). ?Urinating because you cannot get to the bathroom in time due to a physical disability, such as arthritis or injury, or due to a communication or thinking problem, such as Alzheimer's disease (functional incontinence). ?How is this diagnosed? ?This condition may be diagnosed based on: ?Your medical history. ?A physical exam. ?Tests, such as: ?Urine tests. ?X-rays of your kidney and bladder. ?Ultrasound. ?CT scan. ?Cystoscopy. In this procedure, a health care provider inserts a tube with a light and camera (cystoscope) through the urethra and into the bladder to check for problems. ?Urodynamic testing. These tests assess how well the bladder, urethra, and sphincter can store and release urine. There are different types of urodynamic tests, and they vary depending on what the test is measuring. ?To help diagnose your condition, your health care provider may recommend that you keep a log of when you urinate and how much you urinate. ?How is this treated? ?Treatment for this condition depends on the type of incontinence that you have and its cause. Treatment may include: ?Lifestyle changes, such as: ?Quitting smoking. ?Maintaining a healthy weight. ?Staying active. Try to get 150 minutes of moderate-intensity exercise every week. Ask your health care provider which activities are safe for  you. ?Eating a healthy diet. ?Avoid high-fat foods, like fried foods. ?Avoid refined carbohydrates like white bread and white rice. ?Limit how much alcohol and caffeine you drink. ?Increase  your fiber intake. Healthy sources of fiber include beans, whole grains, and fresh fruits and vegetables. ?Behavioral changes, such as: ?Pelvic floor muscle exercises. ?Bladder training, such as lengthening the amount of time between bathroom breaks, or using the bathroom at regular intervals. ?Using techniques to suppress bladder urges. This can include distraction techniques or controlled breathing exercises. ?Medicines, such as: ?Medicines to relax the bladder muscles and prevent bladder spasms. ?Medicines to help slow or prevent the growth of a man's prostate. ?Botox injections. These can help relax the bladder muscles. ?Treatments, such as: ?Using pulses of electricity to help change bladder reflexes (electrical nerve stimulation). ?For women, using a medical device to prevent urine leaks. This is a small, tampon-like, disposable device that is inserted into the urethra. ?Injecting collagen or carbon beads (bulking agents) into the urinary sphincter. These can help thicken tissue and close the bladder opening. ?Surgery. ?Follow these instructions at home: ?Lifestyle ?Limit alcohol and caffeine. These can fill your bladder quickly and irritate it. ?Keep yourself clean to help prevent odors and skin damage. Ask your health care provider about special skin creams and cleansers that can protect the skin from urine. ?Consider wearing pads or adult diapers. Make sure to change them regularly, and always change them right after experiencing incontinence. ?General instructions ?Take over-the-counter and prescription medicines only as told by your health care provider. ?Use the bathroom about every 3-4 hours, even if you do not feel the need to urinate. Try to empty your bladder completely every time. After urinating, wait a minute. Then try to urinate again. ?Make sure you are in a relaxed position while urinating. ?If your incontinence is caused by nerve problems, keep a log of the medicines you take and the times you  go to the bathroom. ?Keep all follow-up visits. This is important. ?Where to find more information ?[employer] of Diabetes and Digestive and Kidney Diseases: [employer] ?American Urology Association: www.urologyhealth.org ?Contact a health care provider if: ?You have pain that gets worse. ?Your incontinence gets worse. ?Get help right away if: ?You have a fever or chills. ?You are unable to urinate. ?You have redness in your groin area or down your legs. ?Summary ?Urinary incontinence refers to a condition in which a person is unable to control where and when to pass urine. ?This condition may be caused by medicines, infection, weak bladder muscles, weak pelvic floor muscles, enlargement of the prostate (in men), or surgery. ?Factors such as older age, obesity, pregnancy and childbirth, menopause, neurological diseases, and chronic coughing may increase your risk for developing this condition. ?Types of urinary incontinence include urge incontinence, stress incontinence, overflow incontinence, and functional incontinence. ?This condition is usually treated first with lifestyle and behavioral changes, such as quitting smoking, eating a healthier diet, and doing regular pelvic floor exercises. Other treatment options include medicines, bulking agents, medical devices, electrical nerve stimulation, or surgery. ?This information is not intended to replace advice given to you by your health care provider. Make sure you discuss any questions you have with your health care provider. ?Document Revised: 10/14/2019 Document Reviewed: 10/14/2019 ?Elsevier Patient Education ? [place]. ? ?

## 2021-06-18 NOTE — Progress Notes (Signed)
Assessment and Plan: ? ?Charlotte Wise was seen today for acute visit. ? ?Diagnoses and all orders for this visit: ? ?Urinary frequency ?UTI versus OAB versus other pelvic/neurogenic etiology   ?- will check UA, C&S, also wet prep (self swab), low risk for other infectious etiology, defer for now ?-     Urinalysis, Routine w reflex microscopic ?-     Urine Culture ?-     WET PREP BY MOLECULAR PROBE ?- if all negative, given myrberiq samples to try ? ?Urinary incontinence in female ?Hx of spinal issues though no other sx to suggest spinal etiology, mainly nocturnal, ? Oversedating at night ?She will hold temazepam while taking high dose gabapentin after discussion ?If above workup negative try 2 week myrbetriq trial  ?Advised to limit caffeine, artificial sweeteners ?Call if any changes ?If no improvement encourage GYN follow up for full pelvic evaluation ?-     mirabegron ER (MYRBETRIQ) 25 MG TB24 tablet; Take 1 tab daily for 1 week then 2 tabs (50 mg) daily for 1 week for bladder symptoms. ? ? ?Further disposition pending results of labs. Discussed med's effects and SE's.   ?Over 15 minutes of exam, counseling, chart review, and critical decision making was performed.  ? ?Future Appointments  ?Date Time Provider [place]  ?11/04/2021  8:30 AM Sater, Nanine Means, MD [facility] None  ?06/11/2022  3:00 PM Liane Comber, NP [facility] None  ? ? ?------------------------------------------------------------------------------------------------------------------ ? ? ?HPI ?BP 122/76   Pulse 71   Temp 97.7 ?F (36.5 ?C)   Wt 155 lb (70.3 kg)   SpO2 99%   BMI 29.05 kg/m?  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  ? ?She reports in the last few weeks (unclear on exact timeline), reports urinary urgency, lower back pressure (denies pain, other radicular sx), sense of bladder irritation and also new episodes of urinary incontinence  (mostly mild leaking episodes 5-6 episodes, mostly would wake her up from deep sleep at night and was able to wake up and stop flow to run to bathroom. 1 episode of daytime incontinence, last was 3/23-24 or so per patient, hasn't had incontinence episodes since.  ? ?She denies fever/chills, dysuria (just sense of urethral discomfort), denies vaginal discharge (no new sexual partners, married, no changes with intercourse), pelvic pressure/fullness, peripheral edema. ? ? ?She was referred to neuro for neck and back pain, MRI in 2021 showed cervical spinal stenosis mild/mod at C6-7 with radicular sx of extremities intermittently, R>L. No surgery recommended at this point, following up q10mby neuro, managing with tylenol, gabapentin, CBD oil, natural supplements. She denies midline pain, loss of bowel control, saddle anesthesia, any other change in her peripheral sx.  ? ?She has been taking high dose gabapentin 600-900 mg TID along with temazepam 30 mg at night, CBD supplement at night and wonders if meds could contribute.  ? ? ?Past Medical History:  ?Diagnosis Date  ? Allergy   ? Anxiety   ? Arthritis   ? Diverticulosis   ? Gastritis   ? Hyperlipidemia 05/12/2018  ? Major depressive disorder, 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[place] 04/15/2016  ? Schizophreniform disorder ([place] 04/23/2016  ? Sciatica of right side 07/27/2019  ? Thyroid disease   ?  ? ?Allergies  ?Allergen Reactions  ? Hydrocodone Itching  ? Oxycodone-Acetaminophen   ? Penicillins Swelling  ? ? ?Current Outpatient Medications on File Prior to Visit  ?Medication Sig  ?  acetaminophen (TYLENOL) 500 MG tablet Take 500 mg by mouth every 6 (six) hours as needed. Takes 0-6 per day depending on her pain level  ? aluminum-magnesium hydroxide-simethicone (MAALOX) 200-200-20 MG/5ML SUSP Take 20 mLs by mouth 3 (three) times daily.  ? AMBULATORY NON FORMULARY MEDICATION 1 Scoop daily. Medication Name: Clear Vite-PSF (k-84) Enzyme based, multivitamin, mineral &  herbal dietary supplement powder  ? AMBULATORY NON FORMULARY MEDICATION 1 tablet at bedtime. Medication Name: Marny Lowenstein dietary supplement with proteolytiic enzymes for the gut  ? AMBULATORY NON FORMULARY MEDICATION 2 tablets daily in the afternoon. Medication Name: [place] 2725 for adrenal support  ? AMBULATORY NON FORMULARY MEDICATION 3 tablets daily in the afternoon. Medication Name: A-F betafood dietary supplement 0825 for gallbladder  ? Bismuth Subsalicylate (PEPTO-BISMOL PO) Take by mouth as needed.  ? Ca Carbonate-Mag Hydroxide (ROLAIDS PO) Take 1 each by mouth as needed.  ? etodolac (LODINE) 400 MG tablet Take 1 tablet (400 mg total) by mouth 2 (two) times daily.  ? gabapentin (NEURONTIN) 300 MG capsule TAKE 3 CAPSULES(900 MG) BY MOUTH THREE TIMES DAILY AS NEEDED FOR PAIN  ? Glucosamine-Chondroitin 1500-1200 MG/30ML LIQD Take by mouth.  ? levothyroxine (SYNTHROID) 75 MCG tablet TAKE 1 TABLET DAILY ON EMPTY STOMACH WITH ONLY WATER FOR 30 MINUTES & NO ANTACID MEDS, CALCIUM OR MAGNESIUM FOR 4 HOURS AND AVOID BIOTIN  ? LO LOESTRIN FE 1 MG-10 MCG / 10 MCG tablet Take 1 tablet by mouth daily.  ? MAGNESIUM PO Take 400 mg by mouth daily.  ? Multiple Vitamins-Minerals (MULTIVITAMIN ADULTS PO) Take 1 tablet by mouth daily.  ? Omega 3-6-9 Fatty Acids (OMEGA-3-6-9 PO) Take 1 Dose by mouth daily.  ? psyllium (METAMUCIL) 58.6 % powder Take 1 packet by mouth daily as needed.  ? temazepam (RESTORIL) 30 MG capsule TAKE 1 CAPSULE BY MOUTH EVERY NIGHT AT BEDTIME AS NEEDED FOR SLEEP  ? TURMERIC PO Take by mouth.  ? VITAMIN D PO Take 10,000 Units by mouth daily.  ? dicyclomine (BENTYL) 10 MG capsule Take 1 capsule (10 mg total) by mouth 4 (four) times daily -  before meals and at bedtime. (Patient not taking: Reported on 06/06/2021)  ? ?No current facility-administered medications on file prior to visit.  ? ?Allergies:  ?Allergies  ?Allergen Reactions  ? Hydrocodone Itching  ? Oxycodone-Acetaminophen   ?  Penicillins Swelling  ? ?Surgical History:  ?She  has a past surgical history that includes Tonsillectomy and adenoidectomy (1981); Knee arthroscopy (Left, 1988); Knee arthroscopy (Right, 1999); Ankle arthroscopy (Right, 2000); laparoscopy (2001); Nasal polyp surgery (2006); Colonoscopy (05/01/2020); and Upper gastrointestinal endoscopy (05/01/2020). ?Family History:  ?Herfamily history includes Alcohol abuse in her brother, father, and mother; Clotting disorder in her mother; Drug abuse in her brother, father, and mother; Heart disease in her brother and paternal grandfather; Kidney disease in her maternal grandfather; Other in her mother; Stroke in her paternal grandfather; Thyroid cancer in her maternal grandmother. ?Social History:  ? reports that she has never smoked. She has never used smokeless tobacco. She reports that she does not currently use alcohol. She reports that she does not use drugs. ? ? ?ROS: all negative except above.  ? ?Physical Exam: ? ?BP 122/76   Pulse 71   Temp 97.7 ?F (36.5 ?C)   Wt 155 lb (70.3 kg)   SpO2 99%   BMI 29.05 kg/m?  ? ?General Appearance: Well nourished, in no apparent distress. ?Eyes: PERRLA, conjunctiva no swelling or erythema ?ENT/Mouth: Mask  in place; Hearing normal.  ?Neck: Supple ?Respiratory: Respiratory effort normal ?Cardio: RRR with no MRGs. Brisk peripheral pulses without edema.  ?Abdomen: Soft, + BS.  Non tender, no guarding, rebound, hernias, masses. No CVA tenderness ?Lymphatics: Non tender without lymphadenopathy.  ?Musculoskeletal: Full ROM, 5/5 strength, normal gait. Neg straight leg raise. No midline spinous tenderness.  ?Skin: Warm, dry without rashes, lesions, ecchymosis.  ?Neuro: Cranial nerves intact. Normal muscle tone, no cerebellar symptoms. Sensation intact.  ?Psych: Awake and oriented X 3, normal affect, Insight and Judgment appropriate.  ?  ? ?Izora Ribas, NP ?9:15 AM ?[facility] ? ?

## 2021-06-19 LAB — URINALYSIS, ROUTINE W REFLEX MICROSCOPIC
Bacteria, UA: NONE SEEN /HPF
Bilirubin Urine: NEGATIVE
Glucose, UA: NEGATIVE
Hgb urine dipstick: NEGATIVE
Hyaline Cast: NONE SEEN /LPF
Ketones, ur: NEGATIVE
Nitrite: NEGATIVE
Protein, ur: NEGATIVE
RBC / HPF: NONE SEEN /HPF (ref 0–2)
Specific Gravity, Urine: 1.007 (ref 1.001–1.035)
Squamous Epithelial / HPF: NONE SEEN /HPF (ref <=5)
WBC, UA: NONE SEEN /HPF (ref 0–5)
pH: 7 (ref 5.0–8.0)

## 2021-06-19 LAB — URINE CULTURE
MICRO NUMBER:: 13190571
Result:: NO GROWTH
SPECIMEN QUALITY:: ADEQUATE

## 2021-06-19 LAB — WET PREP BY MOLECULAR PROBE
Candida species: NOT DETECTED
Gardnerella vaginalis: NOT DETECTED
MICRO NUMBER:: 13190878
SPECIMEN QUALITY:: ADEQUATE
Trichomonas vaginosis: NOT DETECTED

## 2021-06-19 LAB — MICROSCOPIC MESSAGE

## 2021-06-25 DIAGNOSIS — M7918 Myalgia, other site: Secondary | ICD-10-CM | POA: Diagnosis not present

## 2021-06-25 DIAGNOSIS — M25551 Pain in right hip: Secondary | ICD-10-CM | POA: Diagnosis not present

## 2021-06-25 DIAGNOSIS — M9901 Segmental and somatic dysfunction of cervical region: Secondary | ICD-10-CM | POA: Diagnosis not present

## 2021-06-25 DIAGNOSIS — M9902 Segmental and somatic dysfunction of thoracic region: Secondary | ICD-10-CM | POA: Diagnosis not present

## 2021-06-25 DIAGNOSIS — M9905 Segmental and somatic dysfunction of pelvic region: Secondary | ICD-10-CM | POA: Diagnosis not present

## 2021-06-25 DIAGNOSIS — M9903 Segmental and somatic dysfunction of lumbar region: Secondary | ICD-10-CM | POA: Diagnosis not present

## 2021-06-27 DIAGNOSIS — M5013 Cervical disc disorder with radiculopathy, cervicothoracic region: Secondary | ICD-10-CM | POA: Diagnosis not present

## 2021-06-27 DIAGNOSIS — M545 Low back pain, unspecified: Secondary | ICD-10-CM | POA: Diagnosis not present

## 2021-07-11 DIAGNOSIS — M5013 Cervical disc disorder with radiculopathy, cervicothoracic region: Secondary | ICD-10-CM | POA: Diagnosis not present

## 2021-07-11 DIAGNOSIS — M545 Low back pain, unspecified: Secondary | ICD-10-CM | POA: Diagnosis not present

## 2021-07-22 ENCOUNTER — Other Ambulatory Visit: Payer: Self-pay | Admitting: Neurology

## 2021-07-25 ENCOUNTER — Other Ambulatory Visit: Payer: Self-pay | Admitting: Adult Health

## 2021-07-25 ENCOUNTER — Encounter: Payer: Self-pay | Admitting: Adult Health

## 2021-07-25 DIAGNOSIS — M62838 Other muscle spasm: Secondary | ICD-10-CM

## 2021-07-25 MED ORDER — METHOCARBAMOL 750 MG PO TABS: 750.0000 mg | ORAL_TABLET | Freq: Three times a day (TID) | ORAL | 1 refills | Status: DC | PRN

## 2021-07-30 DIAGNOSIS — Z01419 Encounter for gynecological examination (general) (routine) without abnormal findings: Secondary | ICD-10-CM | POA: Diagnosis not present

## 2021-08-15 DIAGNOSIS — M9902 Segmental and somatic dysfunction of thoracic region: Secondary | ICD-10-CM | POA: Diagnosis not present

## 2021-08-15 DIAGNOSIS — M9905 Segmental and somatic dysfunction of pelvic region: Secondary | ICD-10-CM | POA: Diagnosis not present

## 2021-08-15 DIAGNOSIS — M9901 Segmental and somatic dysfunction of cervical region: Secondary | ICD-10-CM | POA: Diagnosis not present

## 2021-08-15 DIAGNOSIS — M25551 Pain in right hip: Secondary | ICD-10-CM | POA: Diagnosis not present

## 2021-08-15 DIAGNOSIS — M9903 Segmental and somatic dysfunction of lumbar region: Secondary | ICD-10-CM | POA: Diagnosis not present

## 2021-08-15 DIAGNOSIS — M7918 Myalgia, other site: Secondary | ICD-10-CM | POA: Diagnosis not present

## 2021-08-16 DIAGNOSIS — M545 Low back pain, unspecified: Secondary | ICD-10-CM | POA: Diagnosis not present

## 2021-08-16 DIAGNOSIS — M5013 Cervical disc disorder with radiculopathy, cervicothoracic region: Secondary | ICD-10-CM | POA: Diagnosis not present

## 2021-08-19 ENCOUNTER — Encounter: Payer: Self-pay | Admitting: Neurology

## 2021-08-19 DIAGNOSIS — M5412 Radiculopathy, cervical region: Secondary | ICD-10-CM

## 2021-08-19 DIAGNOSIS — R200 Anesthesia of skin: Secondary | ICD-10-CM

## 2021-08-19 DIAGNOSIS — M542 Cervicalgia: Secondary | ICD-10-CM

## 2021-08-20 ENCOUNTER — Other Ambulatory Visit: Payer: Self-pay | Admitting: Neurology

## 2021-08-20 MED ORDER — METHYLPREDNISOLONE 4 MG PO TABS: ORAL_TABLET | ORAL | 0 refills | Status: DC

## 2021-09-05 IMAGING — US US THYROID
1 series · 14 of 25 positions shown · non-contrast
Comparison: None.

CLINICAL DATA: Hypothyroid.

EXAM:
THYROID ULTRASOUND
TECHNIQUE: Ultrasound examination of the thyroid gland and adjacent soft
tissues was performed.

[Series 1: us thyroid · 0.04mm/px · 14 of 41 slices shown]
[im 1/41]
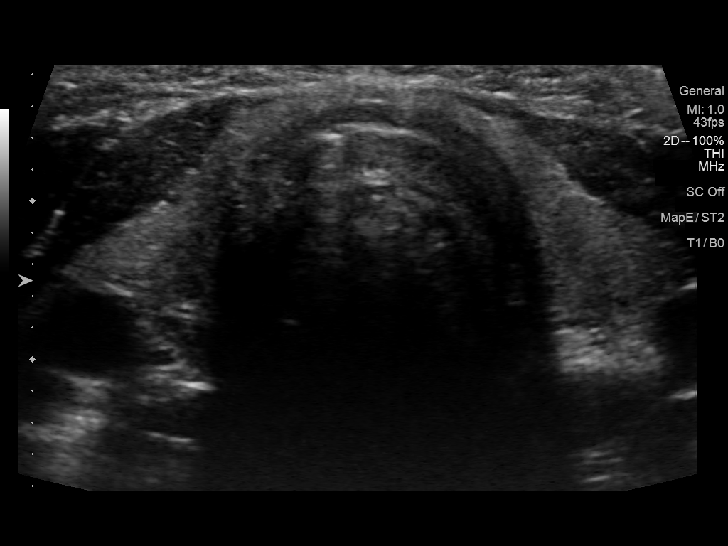
[im 4/41]
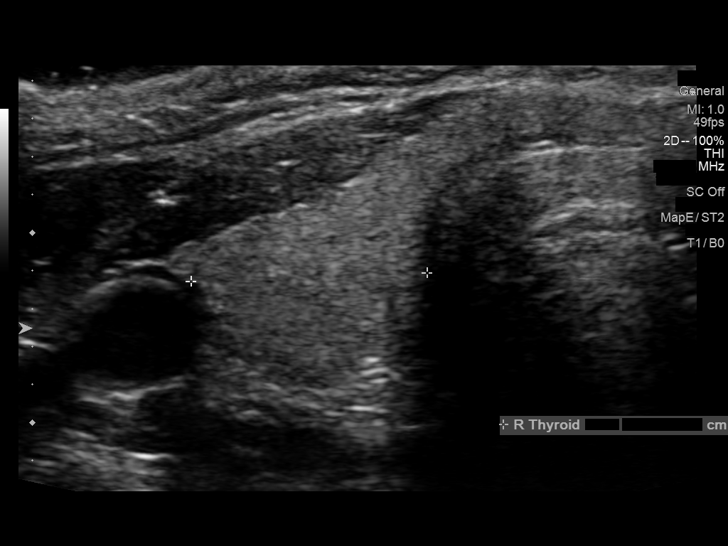
[im 7/41]
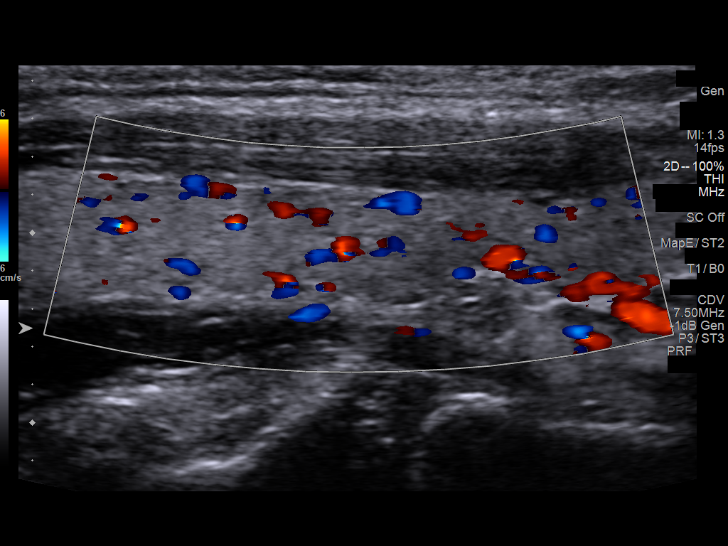
[im 11/41]
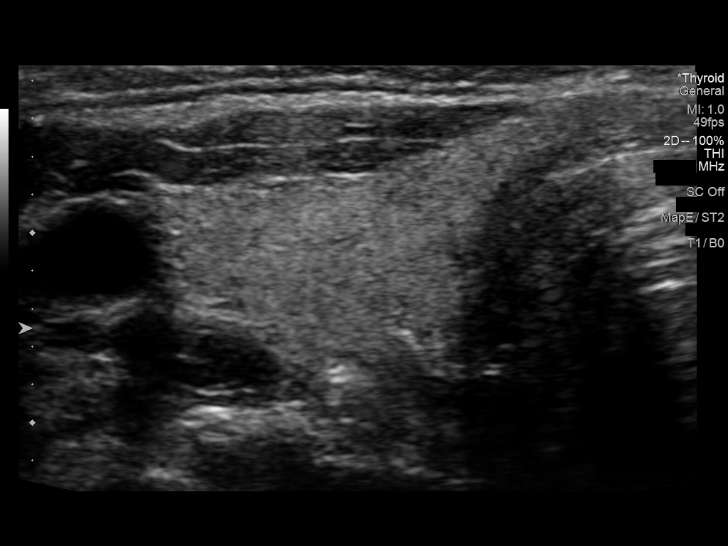
[im 14/41]
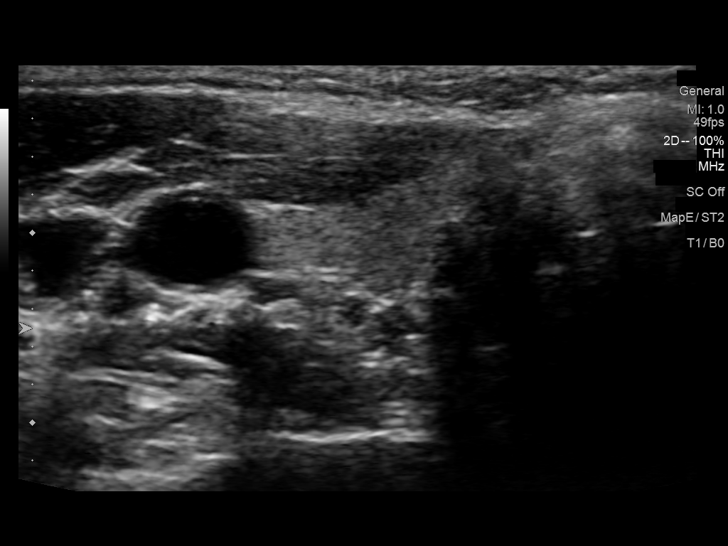
[im 16/41]
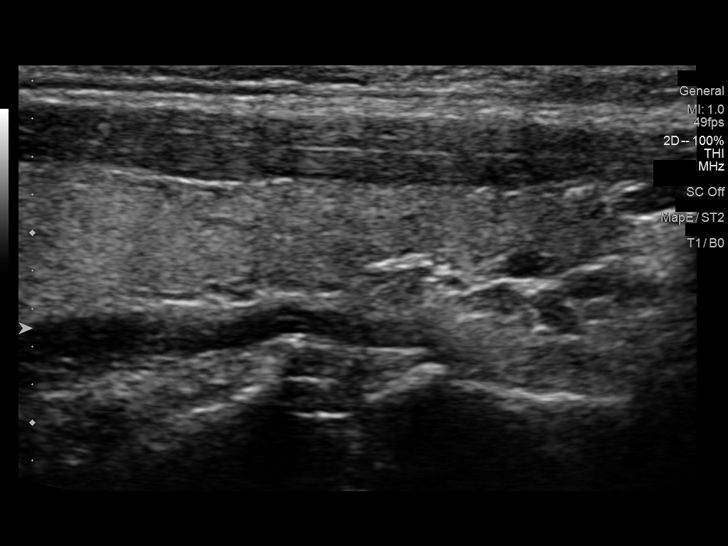
[im 19/41]
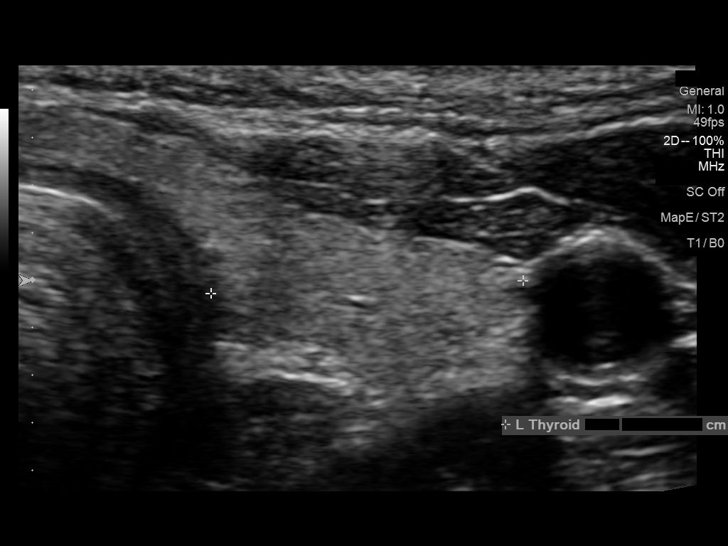
[im 22/41]
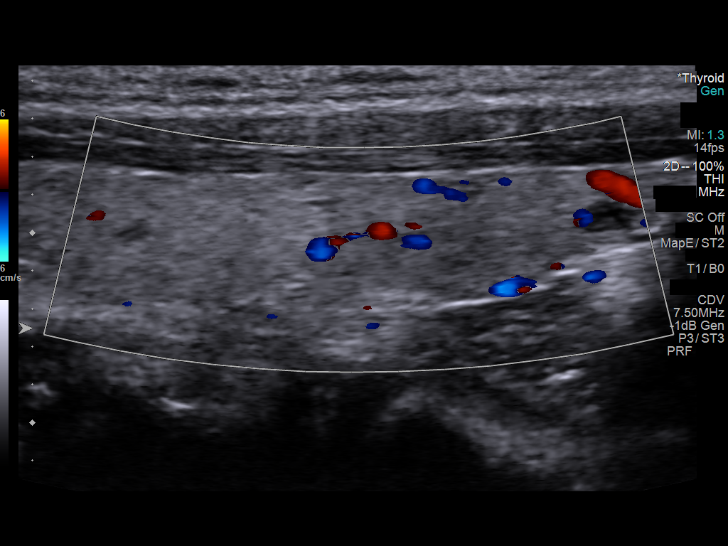
[im 26/41]
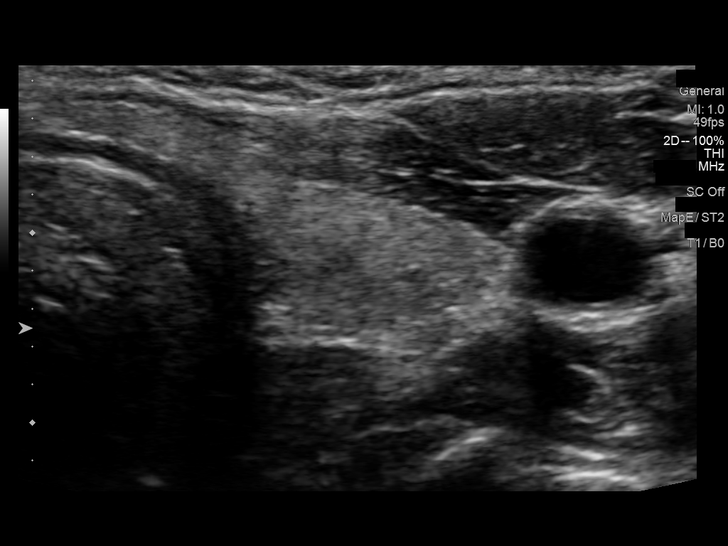
[im 27/41]
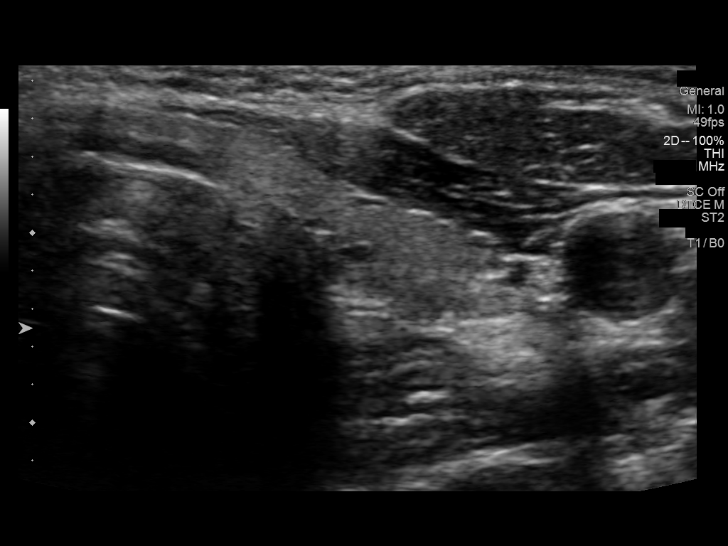
[im 31/41]
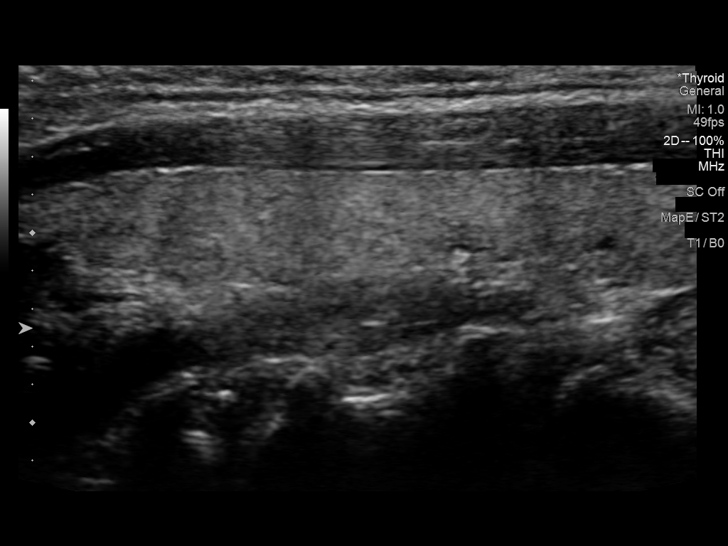
[im 34/41]
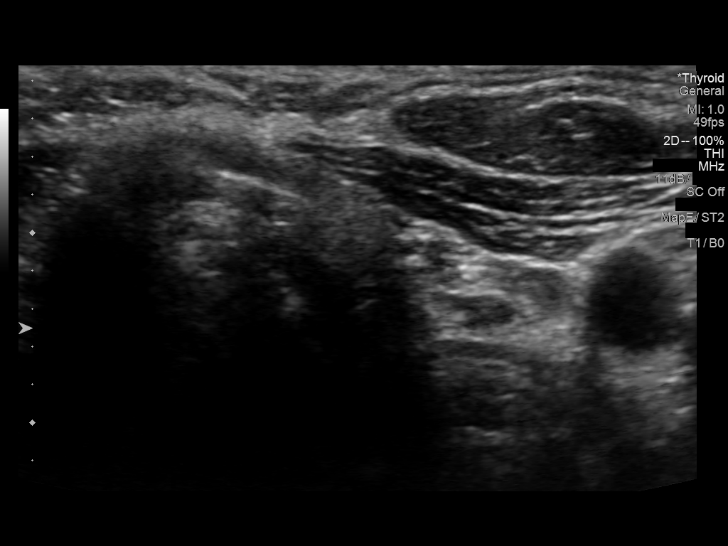
[im 37/41]
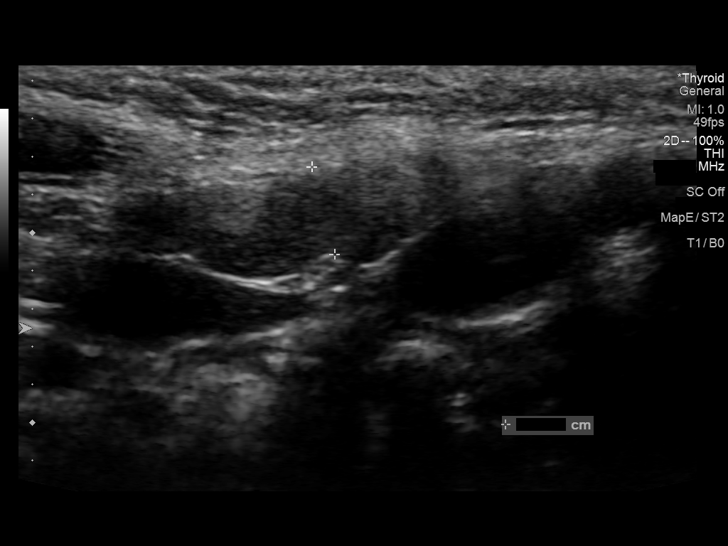
[im 41/41]
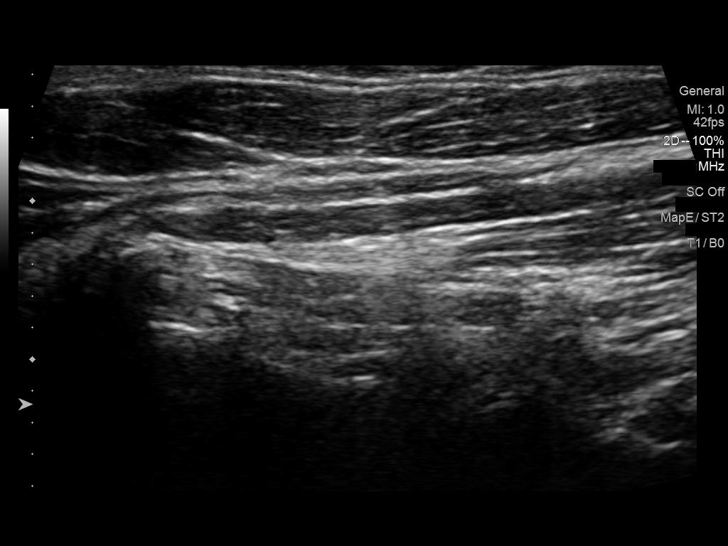

[14 of 25 positions shown; findings below may reference images not displayed]

FINDINGS: Parenchymal Echotexture: Normal

Isthmus: 0.3 cm

Right lobe: 4.2 x 0.7 x 1.2 cm

Left lobe: 4.2 x 0.7 x 1.3 cm

_________________________________________________________

Estimated total number of nodules >/= 1 cm: 0

Number of spongiform nodules >/=  2 cm not described below (TR1): 0

Number of mixed cystic and solid nodules >/= 1.5 cm not described
below (TR2): 0

_________________________________________________________

No discrete nodules are seen within the thyroid gland. No enlarged
lymph nodes around the thyroid tissue.
IMPRESSION: Normal thyroid ultrasound.

## 2021-09-10 ENCOUNTER — Other Ambulatory Visit: Payer: Self-pay | Admitting: Nurse Practitioner

## 2021-09-10 ENCOUNTER — Telehealth: Payer: Self-pay | Admitting: Neurology

## 2021-09-10 NOTE — Telephone Encounter (Signed)
[employer] Auth: [id number] exp. 09/10/21-10/09/21 sent to GI per patient

## 2021-09-17 DIAGNOSIS — M542 Cervicalgia: Secondary | ICD-10-CM | POA: Diagnosis not present

## 2021-09-17 DIAGNOSIS — R200 Anesthesia of skin: Secondary | ICD-10-CM | POA: Diagnosis not present

## 2021-09-17 DIAGNOSIS — M5412 Radiculopathy, cervical region: Secondary | ICD-10-CM | POA: Diagnosis not present

## 2021-09-18 ENCOUNTER — Ambulatory Visit
Admission: RE | Admit: 2021-09-18 | Discharge: 2021-09-18 | Disposition: A | Discharge status: Home / Self Care | Payer: BC Managed Care – PPO | Source: Ambulatory Visit | Attending: Neurology | Admitting: Neurology

## 2021-09-18 DIAGNOSIS — R200 Anesthesia of skin: Secondary | ICD-10-CM | POA: Diagnosis not present

## 2021-09-18 DIAGNOSIS — M5412 Radiculopathy, cervical region: Secondary | ICD-10-CM

## 2021-09-18 DIAGNOSIS — M542 Cervicalgia: Secondary | ICD-10-CM

## 2021-09-18 DIAGNOSIS — R202 Paresthesia of skin: Secondary | ICD-10-CM | POA: Diagnosis not present

## 2021-09-18 MED ORDER — GADOBENATE DIMEGLUMINE 529 MG/ML IV SOLN
14.0000 mL | Freq: Once | INTRAVENOUS | Status: AC | PRN
  Administered 2021-09-18: 14 mL via INTRAVENOUS

## 2021-09-23 ENCOUNTER — Other Ambulatory Visit: Payer: Self-pay | Admitting: *Deleted

## 2021-09-23 DIAGNOSIS — M542 Cervicalgia: Secondary | ICD-10-CM

## 2021-09-23 DIAGNOSIS — M5412 Radiculopathy, cervical region: Secondary | ICD-10-CM

## 2021-09-23 DIAGNOSIS — R200 Anesthesia of skin: Secondary | ICD-10-CM

## 2021-09-25 ENCOUNTER — Telehealth: Payer: Self-pay | Admitting: Neurology

## 2021-09-25 NOTE — Telephone Encounter (Signed)
Referral for Neurosurgery sent to [facility] [phone]. 

## 2021-09-25 NOTE — Telephone Encounter (Signed)
Referral for Pain Clinic sent to [facility] [phone].

## 2021-10-08 DIAGNOSIS — M542 Cervicalgia: Secondary | ICD-10-CM | POA: Diagnosis not present

## 2021-10-08 DIAGNOSIS — G959 Disease of spinal cord, unspecified: Secondary | ICD-10-CM | POA: Diagnosis not present

## 2021-10-08 DIAGNOSIS — M5412 Radiculopathy, cervical region: Secondary | ICD-10-CM | POA: Diagnosis not present

## 2021-10-08 DIAGNOSIS — Z6828 Body mass index (BMI) 28.0-28.9, adult: Secondary | ICD-10-CM | POA: Diagnosis not present

## 2021-10-11 DIAGNOSIS — M5013 Cervical disc disorder with radiculopathy, cervicothoracic region: Secondary | ICD-10-CM | POA: Diagnosis not present

## 2021-10-11 DIAGNOSIS — M545 Low back pain, unspecified: Secondary | ICD-10-CM | POA: Diagnosis not present

## 2021-10-16 ENCOUNTER — Other Ambulatory Visit: Payer: Self-pay | Admitting: Nurse Practitioner

## 2021-10-17 ENCOUNTER — Other Ambulatory Visit: Payer: Self-pay

## 2021-10-17 MED ORDER — PROMETHAZINE HCL 25 MG PO TABS: ORAL_TABLET | ORAL | 0 refills | Status: DC

## 2021-10-30 ENCOUNTER — Encounter: Payer: Self-pay | Admitting: Neurology

## 2021-11-04 ENCOUNTER — Ambulatory Visit: Payer: BC Managed Care – PPO | Admitting: Neurology

## 2021-11-06 ENCOUNTER — Encounter: Payer: Self-pay | Admitting: Internal Medicine

## 2021-11-07 ENCOUNTER — Encounter: Payer: Self-pay | Admitting: Neurology

## 2021-11-07 DIAGNOSIS — Z6829 Body mass index (BMI) 29.0-29.9, adult: Secondary | ICD-10-CM | POA: Diagnosis not present

## 2021-11-07 DIAGNOSIS — G959 Disease of spinal cord, unspecified: Secondary | ICD-10-CM | POA: Diagnosis not present

## 2021-11-18 ENCOUNTER — Other Ambulatory Visit: Payer: Self-pay | Admitting: Neurology

## 2021-11-18 ENCOUNTER — Encounter: Payer: Self-pay | Admitting: Neurology

## 2021-11-18 MED ORDER — OXYCODONE HCL 5 MG PO TABS: 5.0000 mg | ORAL_TABLET | Freq: Two times a day (BID) | ORAL | 0 refills | Status: DC | PRN

## 2021-11-21 ENCOUNTER — Other Ambulatory Visit: Payer: Self-pay | Admitting: *Deleted

## 2021-11-21 MED ORDER — OXYCODONE HCL 5 MG PO TABS: 5.0000 mg | ORAL_TABLET | Freq: Four times a day (QID) | ORAL | 0 refills | Status: AC | PRN

## 2021-11-21 NOTE — Telephone Encounter (Signed)
PA oxycodone '5mg'$  submitted on CMM. Key: [id number]. Waiting on determination from [place].

## 2021-11-22 ENCOUNTER — Other Ambulatory Visit: Payer: Self-pay | Admitting: Neurology

## 2021-12-04 DIAGNOSIS — G9511 Acute infarction of spinal cord (embolic) (nonembolic): Secondary | ICD-10-CM | POA: Diagnosis not present

## 2021-12-11 DIAGNOSIS — M5002 Cervical disc disorder with myelopathy, mid-cervical region, unspecified level: Secondary | ICD-10-CM | POA: Diagnosis not present

## 2021-12-11 DIAGNOSIS — M4722 Other spondylosis with radiculopathy, cervical region: Secondary | ICD-10-CM | POA: Diagnosis not present

## 2021-12-26 ENCOUNTER — Other Ambulatory Visit: Payer: Self-pay | Admitting: Nurse Practitioner

## 2022-01-01 ENCOUNTER — Telehealth: Payer: Self-pay | Admitting: Nurse Practitioner

## 2022-01-02 DIAGNOSIS — M5013 Cervical disc disorder with radiculopathy, cervicothoracic region: Secondary | ICD-10-CM | POA: Diagnosis not present

## 2022-01-02 DIAGNOSIS — M545 Low back pain, unspecified: Secondary | ICD-10-CM | POA: Diagnosis not present

## 2022-01-02 NOTE — Telephone Encounter (Signed)
Pt is needing temapzepam filled but hasn't been seen since 03/203. Will she need an appt?

## 2022-01-03 ENCOUNTER — Other Ambulatory Visit: Payer: Self-pay | Admitting: Nurse Practitioner

## 2022-01-03 DIAGNOSIS — G4700 Insomnia, unspecified: Secondary | ICD-10-CM

## 2022-01-03 NOTE — Telephone Encounter (Signed)
We cannot refill her Temazepam since she is on pain medication.  DEA strictly monitors and you cannot get controlled substances from 2 different prescribers

## 2022-01-06 ENCOUNTER — Other Ambulatory Visit: Payer: Self-pay | Admitting: Nurse Practitioner

## 2022-01-06 DIAGNOSIS — G4700 Insomnia, unspecified: Secondary | ICD-10-CM

## 2022-01-06 MED ORDER — TEMAZEPAM 30 MG PO CAPS: ORAL_CAPSULE | ORAL | 0 refills | Status: DC

## 2022-01-06 NOTE — Telephone Encounter (Signed)
Done

## 2022-01-07 DIAGNOSIS — G959 Disease of spinal cord, unspecified: Secondary | ICD-10-CM | POA: Diagnosis not present

## 2022-01-07 DIAGNOSIS — M545 Low back pain, unspecified: Secondary | ICD-10-CM | POA: Diagnosis not present

## 2022-01-07 DIAGNOSIS — M5013 Cervical disc disorder with radiculopathy, cervicothoracic region: Secondary | ICD-10-CM | POA: Diagnosis not present

## 2022-01-09 DIAGNOSIS — M5013 Cervical disc disorder with radiculopathy, cervicothoracic region: Secondary | ICD-10-CM | POA: Diagnosis not present

## 2022-01-09 DIAGNOSIS — M545 Low back pain, unspecified: Secondary | ICD-10-CM | POA: Diagnosis not present

## 2022-01-14 DIAGNOSIS — M5013 Cervical disc disorder with radiculopathy, cervicothoracic region: Secondary | ICD-10-CM | POA: Diagnosis not present

## 2022-01-14 DIAGNOSIS — M545 Low back pain, unspecified: Secondary | ICD-10-CM | POA: Diagnosis not present

## 2022-01-15 NOTE — Progress Notes (Unsigned)
Assessment and Plan: Charlotte Wise was seen today for acute visit.  Diagnoses and all orders for this visit:  Hypothyroidism due to Hashimoto's thyroiditis Continue Levothyroxine Please take your thyroid medication greater than 30 min before breakfast, separated by at least 4 hours  from antacids, calcium, iron, and multivitamins.  Schizophreniform disorder ([place]) Is not currently being followed by psych and is not on medication Continue to monitor  Abnormal body odor C/O foul odor to her hair/scalp which has been ongoing since 09/2021 Will try using clarifying shampoo and only wash hair every other day  No lesions or odor noted on exam today If persists will refer to dermatology  Anxiety -     hydrOXYzine (ATARAX) 50 MG tablet; Take 1-2 tablets (50-100 mg total) by mouth every 6 (six) hours as needed for anxiety. Continue diet , exercise, practice good sleep hygiene      Further disposition pending results of labs. Discussed med's effects and SE's.   Over 30 minutes of exam, counseling, chart review, and critical decision making was performed.   Future Appointments  Date Time Provider Charlotte Wise  06/11/2022  3:00 PM Charlotte Wise, Charlotte Houseman, NP [facility] None    ------------------------------------------------------------------------------------------------------------------   HPI BP 124/82   Pulse 70   Temp 97.9 F (36.6 C)   Ht 5' 1.25" (1.556 m)   Wt 156 lb 3.2 oz (70.9 kg)   SpO2 98%   BMI 29.27 kg/m    46 y.o.female presents for odor on her head which has been occurring since July.  States when she wakes up she has an odor on her pillow and has to wash it the odor is so bad. She has been using Nizoral shampoo and has noticed no changes.   In June she developed neck problems - 5 weeks ago had cervical spine surgery. She had been laying in bed due to pain until she was able to have the surgery on 12/11/21. Surgery was done by Dr. Saintclair Charlotte Wise. Her pain has improved since the  surgery- numbness and tingling in  right hand is improving. She is currently doing PT.    BP is well controlled without medication BP Readings from Last 3 Encounters:  01/16/22 124/82  06/18/21 122/76  06/06/21 122/80    BMI is Body mass index is 29.27 kg/m., she has not been working on diet and exercise. Wt Readings from Last 3 Encounters:  01/16/22 156 lb 3.2 oz (70.9 kg)  06/18/21 155 lb (70.3 kg)  06/06/21 154 lb 6.4 oz (70 kg)    Thyroid is currently in normal range on levothyroxine 75 mcg QD. 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 Takes 0-6 per day depending on her pain level   AMBULATORY NON FORMULARY MEDICATION 1 tablet at bedtime. Medication Name: Marny Lowenstein dietary supplement with proteolytiic enzymes  for the gut   Bismuth Subsalicylate (PEPTO-BISMOL PO) Take by mouth as needed.   Ca Carbonate-Mag Hydroxide (ROLAIDS PO) Take 1 each by mouth as needed.   gabapentin (NEURONTIN) 400 MG capsule TAKE 2 CAPSULES(800 MG) BY MOUTH FOUR TIMES DAILY AS NEEDED   Glucosamine-Chondroitin 1500-1200 MG/30ML LIQD Take by mouth.   levothyroxine (SYNTHROID) 75 MCG tablet TAKE 1 TABLET DAILY ON EMPTY STOMACH WITH ONLY WATER FOR 30 MINUTES & NO ANTACID MEDS, CALCIUM OR MAGNESIUM FOR 4 HOURS AND AVOID BIOTIN   LO LOESTRIN FE 1 MG-10 MCG / 10 MCG tablet Take 1 tablet by mouth  daily.   MAGNESIUM PO Take 400 mg by mouth daily.   methocarbamol (ROBAXIN-750) 750 MG tablet Take 1 tablet (750 mg total) by mouth every 8 (eight) hours as needed for muscle spasms.   Multiple Vitamins-Minerals (MULTIVITAMIN ADULTS PO) Take 1 tablet by mouth daily.   naproxen sodium (ALEVE) 220 MG tablet Take 220 mg by mouth.   promethazine (PHENERGAN) 25 MG tablet TAKE 1 TABLET BY MOUTH EVERY 6 HOURS AS NEEDED FOR NAUSEA OR HEADACHE   psyllium (METAMUCIL) 58.6 % powder Take 1 packet by mouth daily as needed.   temazepam (RESTORIL) 30 MG capsule TAKE 1 CAPSULE BY MOUTH EVERY NIGHT AT BEDTIME AS NEEDED FOR SLEEP   TURMERIC PO Take by mouth.   VITAMIN D PO Take 10,000 Units by mouth daily.   aluminum-magnesium hydroxide-simethicone (MAALOX) 962-229-79 MG/5ML SUSP Take 20 mLs by mouth 3 (three) times daily. (Patient not taking: Reported on 01/16/2022)   AMBULATORY NON FORMULARY MEDICATION 1 Scoop daily. Medication Name: Clear Vite-PSF (k-84) Enzyme based, multivitamin, mineral & herbal dietary supplement powder (Patient not taking: Reported on 01/16/2022)   AMBULATORY NON FORMULARY MEDICATION 2 tablets daily in the afternoon. Medication Name: Drenamin dietary supplement 3700 for adrenal support (Patient not taking: Reported on 01/16/2022)   AMBULATORY NON FORMULARY MEDICATION 3 tablets daily in the afternoon. Medication Name: A-F betafood dietary supplement 0825 for gallbladder (Patient not taking: Reported on 01/16/2022)   dicyclomine (BENTYL) 10 MG capsule Take 1 capsule (10 mg total) by mouth 4 (four) times daily -  before meals and at bedtime. (Patient not taking: Reported on 06/06/2021)   etodolac (LODINE) 400 MG tablet Take 1 tablet (400 mg total) by mouth 2 (two) times daily. (Patient not taking: Reported on 01/16/2022)   methylPREDNISolone (MEDROL) 4 MG tablet Taper from 6 pills po for one day to 1 pill po the last day over 6 days (Patient not taking: Reported on 01/16/2022)   mirabegron ER  (MYRBETRIQ) 25 MG TB24 tablet Take 1 tab daily for 1 week then 2 tabs (50 mg) daily for 1 week for bladder symptoms. (Patient not taking: Reported on 01/16/2022)   Omega 3-6-9 Fatty Acids (OMEGA-3-6-9 PO) Take 1 Dose by mouth daily. (Patient not taking: Reported on 01/16/2022)   No current facility-administered medications on file prior to visit.    ROS: all negative except above.   Physical Exam:  BP 124/82   Pulse 70   Temp 97.9 F (36.6 C)   Ht 5' 1.25" (1.556 m)   Wt 156 lb 3.2 oz (70.9 kg)   SpO2 98%   BMI 29.27 kg/m   General Appearance: Well nourished, in no apparent distress. Eyes: PERRLA, EOMs, conjunctiva no swelling or erythema Sinuses: No Frontal/maxillary tenderness ENT/Mouth: Ext aud canals clear, TMs without erythema, bulging. No erythema, swelling, or exudate on post pharynx.  Tonsils not swollen or erythematous. Hearing normal.  Neck: Supple, thyroid normal.  Respiratory: Respiratory effort normal, BS equal bilaterally without rales, rhonchi, wheezing or stridor.  Cardio: RRR with no MRGs. Brisk peripheral pulses without edema.  Abdomen: Soft, + BS.  Non tender, no guarding, rebound, hernias, masses. Lymphatics: Non tender without lymphadenopathy.  Musculoskeletal: Full ROM, 5/5 strength, normal gait.  Skin: Warm, dry without rashes, lesions, ecchymosis. No odor noted to scalp on exam Neuro: Cranial nerves intact. Normal muscle tone, no cerebellar symptoms. Sensation intact.  Psych: Awake and oriented X 3, normal affect, Insight and Judgment appropriate.     Alycia Rossetti, NP 9:47 AM [facility]

## 2022-01-16 ENCOUNTER — Ambulatory Visit (INDEPENDENT_AMBULATORY_CARE_PROVIDER_SITE_OTHER): Payer: BC Managed Care – PPO | Admitting: Nurse Practitioner

## 2022-01-16 ENCOUNTER — Encounter: Payer: Self-pay | Admitting: Nurse Practitioner

## 2022-01-16 VITALS — BP 124/82 | HR 70 | Temp 97.9°F | Ht 61.25 in | Wt 156.2 lb

## 2022-01-16 DIAGNOSIS — L750 Bromhidrosis: Secondary | ICD-10-CM

## 2022-01-16 DIAGNOSIS — F419 Anxiety disorder, unspecified: Secondary | ICD-10-CM

## 2022-01-16 DIAGNOSIS — E038 Other specified hypothyroidism: Secondary | ICD-10-CM | POA: Diagnosis not present

## 2022-01-16 DIAGNOSIS — F2081 Schizophreniform disorder: Secondary | ICD-10-CM

## 2022-01-16 DIAGNOSIS — E063 Autoimmune thyroiditis: Secondary | ICD-10-CM

## 2022-01-16 MED ORDER — HYDROXYZINE HCL 50 MG PO TABS: 50.0000 mg | ORAL_TABLET | Freq: Four times a day (QID) | ORAL | 0 refills | Status: DC | PRN

## 2022-01-16 NOTE — Patient Instructions (Signed)
Try clarifying shampoo that has tea tree oil or apple cider vinegar  Try washing hair every other day  Can sometimes be caused by medications- possibly muscle relaxant or temazepam  Can stop these medications and determine if smell resolves  If it continues after use of these suggestion advise the office and will refer to dermatology

## 2022-01-17 DIAGNOSIS — M545 Low back pain, unspecified: Secondary | ICD-10-CM | POA: Diagnosis not present

## 2022-01-17 DIAGNOSIS — M5013 Cervical disc disorder with radiculopathy, cervicothoracic region: Secondary | ICD-10-CM | POA: Diagnosis not present

## 2022-01-21 DIAGNOSIS — M5013 Cervical disc disorder with radiculopathy, cervicothoracic region: Secondary | ICD-10-CM | POA: Diagnosis not present

## 2022-01-21 DIAGNOSIS — M545 Low back pain, unspecified: Secondary | ICD-10-CM | POA: Diagnosis not present

## 2022-01-23 DIAGNOSIS — M545 Low back pain, unspecified: Secondary | ICD-10-CM | POA: Diagnosis not present

## 2022-01-23 DIAGNOSIS — M5013 Cervical disc disorder with radiculopathy, cervicothoracic region: Secondary | ICD-10-CM | POA: Diagnosis not present

## 2022-01-28 DIAGNOSIS — M5013 Cervical disc disorder with radiculopathy, cervicothoracic region: Secondary | ICD-10-CM | POA: Diagnosis not present

## 2022-01-28 DIAGNOSIS — M545 Low back pain, unspecified: Secondary | ICD-10-CM | POA: Diagnosis not present

## 2022-01-30 DIAGNOSIS — M5013 Cervical disc disorder with radiculopathy, cervicothoracic region: Secondary | ICD-10-CM | POA: Diagnosis not present

## 2022-01-30 DIAGNOSIS — M545 Low back pain, unspecified: Secondary | ICD-10-CM | POA: Diagnosis not present

## 2022-02-04 DIAGNOSIS — M5013 Cervical disc disorder with radiculopathy, cervicothoracic region: Secondary | ICD-10-CM | POA: Diagnosis not present

## 2022-02-04 DIAGNOSIS — M545 Low back pain, unspecified: Secondary | ICD-10-CM | POA: Diagnosis not present

## 2022-02-06 DIAGNOSIS — M545 Low back pain, unspecified: Secondary | ICD-10-CM | POA: Diagnosis not present

## 2022-02-06 DIAGNOSIS — M5013 Cervical disc disorder with radiculopathy, cervicothoracic region: Secondary | ICD-10-CM | POA: Diagnosis not present

## 2022-02-10 ENCOUNTER — Other Ambulatory Visit: Payer: Self-pay | Admitting: Nurse Practitioner

## 2022-02-10 DIAGNOSIS — M545 Low back pain, unspecified: Secondary | ICD-10-CM | POA: Diagnosis not present

## 2022-02-10 DIAGNOSIS — E039 Hypothyroidism, unspecified: Secondary | ICD-10-CM

## 2022-02-10 DIAGNOSIS — M5013 Cervical disc disorder with radiculopathy, cervicothoracic region: Secondary | ICD-10-CM | POA: Diagnosis not present

## 2022-02-12 DIAGNOSIS — M5013 Cervical disc disorder with radiculopathy, cervicothoracic region: Secondary | ICD-10-CM | POA: Diagnosis not present

## 2022-02-12 DIAGNOSIS — M545 Low back pain, unspecified: Secondary | ICD-10-CM | POA: Diagnosis not present

## 2022-02-26 ENCOUNTER — Encounter: Payer: Self-pay | Admitting: Internal Medicine

## 2022-03-06 ENCOUNTER — Encounter: Payer: Self-pay | Admitting: Nurse Practitioner

## 2022-03-06 ENCOUNTER — Ambulatory Visit (INDEPENDENT_AMBULATORY_CARE_PROVIDER_SITE_OTHER): Payer: BC Managed Care – PPO | Admitting: Nurse Practitioner

## 2022-03-06 VITALS — BP 130/82 | HR 70 | Temp 97.7°F | Ht 61.25 in | Wt 157.0 lb

## 2022-03-06 DIAGNOSIS — E785 Hyperlipidemia, unspecified: Secondary | ICD-10-CM

## 2022-03-06 DIAGNOSIS — F419 Anxiety disorder, unspecified: Secondary | ICD-10-CM | POA: Diagnosis not present

## 2022-03-06 DIAGNOSIS — F2081 Schizophreniform disorder: Secondary | ICD-10-CM | POA: Diagnosis not present

## 2022-03-06 DIAGNOSIS — E063 Autoimmune thyroiditis: Secondary | ICD-10-CM

## 2022-03-06 DIAGNOSIS — G4700 Insomnia, unspecified: Secondary | ICD-10-CM

## 2022-03-06 DIAGNOSIS — E038 Other specified hypothyroidism: Secondary | ICD-10-CM | POA: Diagnosis not present

## 2022-03-06 DIAGNOSIS — Z79899 Other long term (current) drug therapy: Secondary | ICD-10-CM

## 2022-03-06 DIAGNOSIS — L750 Bromhidrosis: Secondary | ICD-10-CM

## 2022-03-06 DIAGNOSIS — M62838 Other muscle spasm: Secondary | ICD-10-CM

## 2022-03-06 MED ORDER — PROMETHAZINE HCL 25 MG PO TABS: ORAL_TABLET | ORAL | 0 refills | Status: DC

## 2022-03-06 MED ORDER — SUCRALFATE 1 G PO TABS: 1.0000 g | ORAL_TABLET | Freq: Three times a day (TID) | ORAL | 0 refills | Status: DC

## 2022-03-06 MED ORDER — FLUCONAZOLE 100 MG PO TABS: 100.0000 mg | ORAL_TABLET | Freq: Every day | ORAL | 0 refills | Status: AC

## 2022-03-06 MED ORDER — TEMAZEPAM 30 MG PO CAPS: ORAL_CAPSULE | ORAL | 0 refills | Status: DC

## 2022-03-06 MED ORDER — METHOCARBAMOL 750 MG PO TABS: 750.0000 mg | ORAL_TABLET | Freq: Three times a day (TID) | ORAL | 1 refills | Status: AC | PRN

## 2022-03-06 NOTE — Progress Notes (Signed)
Assessment and Plan: Charlotte Wise was seen today for acute visit.  Diagnoses and all orders for this visit:  Hypothyroidism due to Hashimoto's thyroiditis Controlled. Continue Levothyroxine. Reminded to take on an empty stomach 30-76mns before food.  Stop any Biotin Supplement 48-72 hours before next TSH level to reduce the risk of falsely low TSH levels. Continue to monitor.     Schizophreniform disorder (Charlotte Wise not currently being followed by psych and Wise not on medication Continue to monitor  Abnormal body odor Continue Ketoconazole shampoo Try oral Diflucan x2 weeks if no improvement refer to Dermatology  Anxiety Continue Hydroxyzine.   Reviewed relaxation techniques.  Sleep hygiene. Psychoeducation:  encouraged personality growth wand development through coping techniques and problem-solving skills. Limit/Decrease/Monitor drug/alcohol intake.    Hyperlipidemia Discussed lifestyle modifications. Recommended diet heavy in fruits and veggies, omega 3's. Decrease consumption of animal meats, cheeses, and dairy products. Remain active and exercise as tolerated. Continue to monitor. Check lipids/TSH  Insomnia Monitor TSH Discussed good sleep hygiene. Establish bed and wake times. Sleep restriction-only sleep estimated hrs sleep. Bed only for sex and sleep, only sleep when sleepy, out of bed if anxious (stimulus control). Reviewed relaxation techniques, mindful meditations. Expected sleep duration. Addressed worries about not sleeping.   Muscle spasm Continue muscle relaxer Stay well hydrated Rest when flared  Medication management All medications discussed and reviewed in full. All questions and concerns regarding medications addressed.     Orders Placed This Encounter  Procedures   CBC with Differential/Platelet   COMPLETE METABOLIC PANEL WITH GFR   Lipid panel   TSH   Outpatient Encounter Medications as of 03/06/2022  Medication Sig   acetaminophen  (TYLENOL) 500 MG tablet Take 500 mg by mouth every 6 (six) hours as needed. Takes 0-6 per day depending on her pain level   AMBULATORY NON FORMULARY MEDICATION 1 Scoop daily. Medication Name: Clear Vite-PSF (k-84) Enzyme based, multivitamin, mineral & herbal dietary supplement powder   AMBULATORY NON FORMULARY MEDICATION 1 tablet at bedtime. Medication Name: IMarny Lowensteindietary supplement with proteolytiic enzymes for the gut   AMBULATORY NON FORMULARY MEDICATION 2 tablets daily in the afternoon. Medication Name: Drenamin dietary supplement 3700 for adrenal support   AMBULATORY NON FORMULARY MEDICATION 3 tablets daily in the afternoon. Medication Name: A-F betafood dietary supplement 0825 for gallbladder   Bismuth Subsalicylate (PEPTO-BISMOL PO) Take by mouth as needed.   Ca Carbonate-Mag Hydroxide (ROLAIDS PO) Take 1 each by mouth as needed.   fluconazole (DIFLUCAN) 100 MG tablet Take 1 tablet (100 mg total) by mouth daily for 14 days.   gabapentin (NEURONTIN) 400 MG capsule TAKE 2 CAPSULES(800 MG) BY MOUTH FOUR TIMES DAILY AS NEEDED   Glucosamine-Chondroitin 1500-1200 MG/30ML LIQD Take by mouth.   hydrOXYzine (ATARAX) 50 MG tablet Take 1-2 tablets (50-100 mg total) by mouth every 6 (six) hours as needed for anxiety.   levothyroxine (SYNTHROID) 75 MCG tablet TAKE 1 TABLET DAILY ON EMPTY STOMACH WITH ONLY WATER FOR 30 MINUTES & NO ANTACID MEDS, CALCIUM OR MAGNESIUM FOR 4 HOURS AND AVOID BIOTIN   LO LOESTRIN FE 1 MG-10 MCG / 10 MCG tablet Take 1 tablet by mouth daily.   MAGNESIUM PO Take 400 mg by mouth daily.   Multiple Vitamins-Minerals (MULTIVITAMIN ADULTS PO) Take 1 tablet by mouth daily.   TURMERIC PO Take by mouth.   VITAMIN D PO Take 10,000 Units by mouth daily.   [DISCONTINUED] methocarbamol (ROBAXIN-750) 750 MG tablet Take 1 tablet (750 mg total) by mouth  every 8 (eight) hours as needed for muscle spasms.   [DISCONTINUED] promethazine (PHENERGAN) 25 MG tablet TAKE 1 TABLET BY MOUTH  EVERY 6 HOURS AS NEEDED FOR NAUSEA OR HEADACHE   [DISCONTINUED] sucralfate (CARAFATE) 1 g tablet Take 1 g by mouth 4 (four) times daily -  with meals and at bedtime.   [DISCONTINUED] temazepam (RESTORIL) 30 MG capsule TAKE 1 CAPSULE BY MOUTH EVERY NIGHT AT BEDTIME AS NEEDED FOR SLEEP   aluminum-magnesium hydroxide-simethicone (MAALOX) 741-287-86 MG/5ML SUSP Take 20 mLs by mouth 3 (three) times daily. (Patient not taking: Reported on 01/16/2022)   dicyclomine (BENTYL) 10 MG capsule Take 1 capsule (10 mg total) by mouth 4 (four) times daily -  before meals and at bedtime. (Patient not taking: Reported on 06/06/2021)   etodolac (LODINE) 400 MG tablet Take 1 tablet (400 mg total) by mouth 2 (two) times daily. (Patient not taking: Reported on 01/16/2022)   methocarbamol (ROBAXIN-750) 750 MG tablet Take 1 tablet (750 mg total) by mouth every 8 (eight) hours as needed for muscle spasms.   methylPREDNISolone (MEDROL) 4 MG tablet Taper from 6 pills po for one day to 1 pill po the last day over 6 days (Patient not taking: Reported on 01/16/2022)   mirabegron ER (MYRBETRIQ) 25 MG TB24 tablet Take 1 tab daily for 1 week then 2 tabs (50 mg) daily for 1 week for bladder symptoms. (Patient not taking: Reported on 03/06/2022)   naproxen sodium (ALEVE) 220 MG tablet Take 220 mg by mouth. (Patient not taking: Reported on 03/06/2022)   Omega 3-6-9 Fatty Acids (OMEGA-3-6-9 PO) Take 1 Dose by mouth daily. (Patient not taking: Reported on 01/16/2022)   promethazine (PHENERGAN) 25 MG tablet TAKE 1 TABLET BY MOUTH EVERY 6 HOURS AS NEEDED FOR NAUSEA OR HEADACHE   psyllium (METAMUCIL) 58.6 % powder Take 1 packet by mouth daily as needed. (Patient not taking: Reported on 03/06/2022)   sucralfate (CARAFATE) 1 g tablet Take 1 tablet (1 g total) by mouth 4 (four) times daily -  with meals and at bedtime.   temazepam (RESTORIL) 30 MG capsule TAKE 1 CAPSULE BY MOUTH EVERY NIGHT AT BEDTIME AS NEEDED FOR SLEEP   No  facility-administered encounter medications on file as of 03/06/2022.     Future Appointments  Date Time Provider [place]  06/11/2022  3:00 PM Tashera Montalvo, Kenney Houseman, NP [facility] None    ------------------------------------------------------------------------------------------------------------------   HPI BP 130/82   Pulse 70   Temp 97.7 F (36.5 C)   Ht 5' 1.25" (1.556 m)   Wt 157 lb (71.2 kg)   SpO2 99%   BMI 29.42 kg/m    46 y.o.female presents for general follow up.  Overall she reports feeling well.    She Wise trying to find a new job.  She Wise a Social research officer, government.   She was followed in 12/2021 for c/o odor on her head which she felt to be due by laying in bed with a wet head after showering, along with reports of inability to move d/t neck and back pain.  She Wise continuing to notice an odor and discoloration on her pillow and has to wash it the odor Wise so bad. She has been using Nizoral shampoo and has noticed minimal changes.   In June she developed neck problems, saw [facility], Dr. Guy Begin,  on 12/24/21 for s/p 2 level anterior cervical.  Her pain has improved since the surgery- numbness and tingling in  right hand Wise improving.  She has completed PT.  She continues to have lower leg pain.  She Wise schedule for an updated MRI.  BP Wise well controlled without medication BP Readings from Last 3 Encounters:  03/06/22 130/82  01/16/22 124/82  06/18/21 122/76    BMI Wise Body mass index Wise 29.42 kg/m., she has not been working on diet and exercise. Wt Readings from Last 3 Encounters:  03/06/22 157 lb (71.2 kg)  01/16/22 156 lb 3.2 oz (70.9 kg)  06/18/21 155 lb (70.3 kg)    Thyroid Wise currently in normal range on levothyroxine 75 mcg QD. Lab Results  Component Value Date   TSH 1.14 06/06/2021     Past Medical History:  Diagnosis Date   Allergy    Anxiety    Arthritis    Diverticulosis    Gastritis    Hyperlipidemia 05/12/2018   Major  depressive disorder, 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[place]) 04/15/2016   Schizophreniform disorder ([place]) 04/23/2016   Sciatica of right side 07/27/2019   Thyroid disease      Allergies  Allergen Reactions   Hydrocodone Itching   Oxycodone-Acetaminophen    Penicillins Swelling    Current Outpatient Medications on File Prior to Visit  Medication Sig   acetaminophen (TYLENOL) 500 MG tablet Take 500 mg by mouth every 6 (six) hours as needed. Takes 0-6 per day depending on her pain level   AMBULATORY NON FORMULARY MEDICATION 1 Scoop daily. Medication Name: Clear Vite-PSF (k-84) Enzyme based, multivitamin, mineral & herbal dietary supplement powder   AMBULATORY NON FORMULARY MEDICATION 1 tablet at bedtime. Medication Name: Marny Lowenstein dietary supplement with proteolytiic enzymes for the gut   AMBULATORY NON FORMULARY MEDICATION 2 tablets daily in the afternoon. Medication Name: Drenamin dietary supplement 3700 for adrenal support   AMBULATORY NON FORMULARY MEDICATION 3 tablets daily in the afternoon. Medication Name: A-F betafood dietary supplement 0825 for gallbladder   Bismuth Subsalicylate (PEPTO-BISMOL PO) Take by mouth as needed.   Ca Carbonate-Mag Hydroxide (ROLAIDS PO) Take 1 each by mouth as needed.   gabapentin (NEURONTIN) 400 MG capsule TAKE 2 CAPSULES(800 MG) BY MOUTH FOUR TIMES DAILY AS NEEDED   Glucosamine-Chondroitin 1500-1200 MG/30ML LIQD Take by mouth.   hydrOXYzine (ATARAX) 50 MG tablet Take 1-2 tablets (50-100 mg total) by mouth every 6 (six) hours as needed for anxiety.   levothyroxine (SYNTHROID) 75 MCG tablet TAKE 1 TABLET DAILY ON EMPTY STOMACH WITH ONLY WATER FOR 30 MINUTES & NO ANTACID MEDS, CALCIUM OR MAGNESIUM FOR 4 HOURS AND AVOID BIOTIN   LO LOESTRIN FE 1 MG-10 MCG / 10 MCG tablet Take 1 tablet by mouth daily.   MAGNESIUM PO Take 400 mg by mouth daily.   Multiple Vitamins-Minerals (MULTIVITAMIN ADULTS PO) Take 1 tablet by mouth daily.   TURMERIC PO Take by  mouth.   VITAMIN D PO Take 10,000 Units by mouth daily.   aluminum-magnesium hydroxide-simethicone (MAALOX) 027-741-28 MG/5ML SUSP Take 20 mLs by mouth 3 (three) times daily. (Patient not taking: Reported on 01/16/2022)   dicyclomine (BENTYL) 10 MG capsule Take 1 capsule (10 mg total) by mouth 4 (four) times daily -  before meals and at bedtime. (Patient not taking: Reported on 06/06/2021)   etodolac (LODINE) 400 MG tablet Take 1 tablet (400 mg total) by mouth 2 (two) times daily. (Patient not taking: Reported on 01/16/2022)   methylPREDNISolone (MEDROL) 4 MG tablet Taper from 6 pills po for one day to 1 pill po the last day over 6 days (Patient  not taking: Reported on 01/16/2022)   mirabegron ER (MYRBETRIQ) 25 MG TB24 tablet Take 1 tab daily for 1 week then 2 tabs (50 mg) daily for 1 week for bladder symptoms. (Patient not taking: Reported on 03/06/2022)   naproxen sodium (ALEVE) 220 MG tablet Take 220 mg by mouth. (Patient not taking: Reported on 03/06/2022)   Omega 3-6-9 Fatty Acids (OMEGA-3-6-9 PO) Take 1 Dose by mouth daily. (Patient not taking: Reported on 01/16/2022)   psyllium (METAMUCIL) 58.6 % powder Take 1 packet by mouth daily as needed. (Patient not taking: Reported on 03/06/2022)   No current facility-administered medications on file prior to visit.    ROS: all negative except above.   Physical Exam:  BP 130/82   Pulse 70   Temp 97.7 F (36.5 C)   Ht 5' 1.25" (1.556 m)   Wt 157 lb (71.2 kg)   SpO2 99%   BMI 29.42 kg/m   General Appearance: Well nourished, in no apparent distress. Eyes: PERRLA, EOMs, conjunctiva no swelling or erythema Sinuses: No Frontal/maxillary tenderness ENT/Mouth: Ext aud canals clear, TMs without erythema, bulging. No erythema, swelling, or exudate on post pharynx.  Tonsils not swollen or erythematous. Hearing normal.  Neck: Supple, thyroid normal.  Respiratory: Respiratory effort normal, BS equal bilaterally without rales, rhonchi, wheezing or  stridor.  Cardio: RRR with no MRGs. Brisk peripheral pulses without edema.  Abdomen: Soft, + BS.  Non tender, no guarding, rebound, hernias, masses. Lymphatics: Non tender without lymphadenopathy.  Musculoskeletal: Full ROM, 5/5 strength, normal gait.  Skin: Warm, dry without rashes, lesions, ecchymosis. No odor noted to scalp on exam Neuro: Cranial nerves intact. Normal muscle tone, no cerebellar symptoms. Sensation intact.  Psych: Awake and oriented X 3, normal affect, Insight and Judgment appropriate.     Darrol Jump, NP 12:35 PM [facility]

## 2022-03-06 NOTE — Patient Instructions (Signed)

## 2022-03-07 LAB — COMPLETE METABOLIC PANEL WITH GFR
AG Ratio: 1.8 (calc) (ref 1.0–2.5)
ALT: 10 U/L (ref 6–29)
AST: 16 U/L (ref 10–35)
Albumin: 4.4 g/dL (ref 3.6–5.1)
Alkaline phosphatase (APISO): 39 U/L (ref 31–125)
BUN/Creatinine Ratio: 6 (calc) (ref 6–22)
BUN: 6 mg/dL — ABNORMAL LOW (ref 7–25)
CO2: 26 mmol/L (ref 20–32)
Calcium: 9.3 mg/dL (ref 8.6–10.2)
Chloride: 105 mmol/L (ref 98–110)
Creat: 0.95 mg/dL (ref 0.50–0.99)
Globulin: 2.5 g/dL (calc) (ref 1.9–3.7)
Glucose, Bld: 90 mg/dL (ref 65–99)
Potassium: 4.7 mmol/L (ref 3.5–5.3)
Sodium: 139 mmol/L (ref 135–146)
Total Bilirubin: 0.6 mg/dL (ref 0.2–1.2)
Total Protein: 6.9 g/dL (ref 6.1–8.1)
eGFR: 74 mL/min/{1.73_m2} (ref >=60)

## 2022-03-07 LAB — CBC WITH DIFFERENTIAL/PLATELET
Absolute Monocytes: 382 cells/uL (ref 200–950)
Basophils Absolute: 51 cells/uL (ref 0–200)
Basophils Relative: 0.9 %
Eosinophils Absolute: 188 cells/uL (ref 15–500)
Eosinophils Relative: 3.3 %
HCT: 35.7 % (ref 35.0–45.0)
Hemoglobin: 12.2 g/dL (ref 11.7–15.5)
Lymphs Abs: 1967 cells/uL (ref 850–3900)
MCH: 32.7 pg (ref 27.0–33.0)
MCHC: 34.2 g/dL (ref 32.0–36.0)
MCV: 95.7 fL (ref 80.0–100.0)
MPV: 9.3 fL (ref 7.5–12.5)
Monocytes Relative: 6.7 %
Neutro Abs: 3112 cells/uL (ref 1500–7800)
Neutrophils Relative %: 54.6 %
Platelets: 380 10*3/uL (ref 140–400)
RBC: 3.73 10*6/uL — ABNORMAL LOW (ref 3.80–5.10)
RDW: 12.3 % (ref 11.0–15.0)
Total Lymphocyte: 34.5 %
WBC: 5.7 10*3/uL (ref 3.8–10.8)

## 2022-03-07 LAB — LIPID PANEL
Cholesterol: 213 mg/dL — ABNORMAL HIGH (ref <200)
HDL: 64 mg/dL (ref >=50)
LDL Cholesterol (Calc): 128 mg/dL (calc) — ABNORMAL HIGH
Non-HDL Cholesterol (Calc): 149 mg/dL (calc) — ABNORMAL HIGH (ref <130)
Total CHOL/HDL Ratio: 3.3 (calc) (ref <5.0)
Triglycerides: 100 mg/dL (ref <150)

## 2022-03-07 LAB — TSH: TSH: 1.06 mIU/L

## 2022-03-11 DIAGNOSIS — M5126 Other intervertebral disc displacement, lumbar region: Secondary | ICD-10-CM | POA: Diagnosis not present

## 2022-03-11 DIAGNOSIS — M5440 Lumbago with sciatica, unspecified side: Secondary | ICD-10-CM | POA: Diagnosis not present

## 2022-03-12 ENCOUNTER — Encounter: Payer: Self-pay | Admitting: Nurse Practitioner

## 2022-03-12 ENCOUNTER — Other Ambulatory Visit: Payer: Self-pay | Admitting: Neurology

## 2022-03-13 ENCOUNTER — Encounter: Payer: Self-pay | Admitting: Neurology

## 2022-03-13 ENCOUNTER — Other Ambulatory Visit: Payer: Self-pay | Admitting: Neurology

## 2022-03-13 ENCOUNTER — Other Ambulatory Visit: Payer: Self-pay | Admitting: Nurse Practitioner

## 2022-03-13 MED ORDER — HYDROXYZINE HCL 50 MG PO TABS: 50.0000 mg | ORAL_TABLET | Freq: Four times a day (QID) | ORAL | 0 refills | Status: DC | PRN

## 2022-03-13 MED ORDER — GABAPENTIN 400 MG PO CAPS: ORAL_CAPSULE | ORAL | 3 refills | Status: DC

## 2022-04-03 ENCOUNTER — Ambulatory Visit: Payer: BC Managed Care – PPO | Admitting: Nurse Practitioner

## 2022-04-03 ENCOUNTER — Encounter: Payer: Self-pay | Admitting: Nurse Practitioner

## 2022-04-03 VITALS — BP 130/84 | HR 71 | Temp 97.2°F | Resp 17 | Ht 61.25 in | Wt 160.0 lb

## 2022-04-03 DIAGNOSIS — L219 Seborrheic dermatitis, unspecified: Secondary | ICD-10-CM | POA: Diagnosis not present

## 2022-04-03 DIAGNOSIS — Z6828 Body mass index (BMI) 28.0-28.9, adult: Secondary | ICD-10-CM

## 2022-04-03 DIAGNOSIS — Z79899 Other long term (current) drug therapy: Secondary | ICD-10-CM

## 2022-04-03 DIAGNOSIS — L750 Bromhidrosis: Secondary | ICD-10-CM

## 2022-04-03 DIAGNOSIS — B370 Candidal stomatitis: Secondary | ICD-10-CM

## 2022-04-03 MED ORDER — FLUCONAZOLE 100 MG PO TABS: 100.0000 mg | ORAL_TABLET | Freq: Every day | ORAL | 0 refills | Status: DC

## 2022-04-03 MED ORDER — NYSTATIN 100000 UNIT/ML MT SUSP: OROMUCOSAL | 1 refills | Status: DC

## 2022-04-03 MED ORDER — LACTINEX PO CHEW: 1.0000 | CHEWABLE_TABLET | Freq: Three times a day (TID) | ORAL | 1 refills | Status: AC

## 2022-04-03 NOTE — Progress Notes (Signed)
Assessment and Plan: Charlotte Wise was seen today for acute visit.  Diagnoses and all orders for this visit:  Oral Candidiasis Start Nystatin swish and swallow Avoid sugars, starches carbohydrates Start probiotic  Scalp dermatitis Start [place] soap/shampoo Keep scalp fee from moisture  Abnormal body odor Continue Diflucan  No improvement refer to Dermatology  Medication management All medications discussed and reviewed in full. All questions and concerns regarding medications addressed.    Meds ordered this encounter  Medications   nystatin (MYCOSTATIN) 100000 UNIT/ML suspension    Sig: Swish 5 mL in the mouth and retain for as long as possible (several minutes) before swallowing.    Dispense:  60 mL    Refill:  1    Order Specific Question:   Supervising Provider    Answer:   Unk Pinto [[id number]]   fluconazole (DIFLUCAN) 100 MG tablet    Sig: Take 1 tablet (100 mg total) by mouth daily.    Dispense:  30 tablet    Refill:  0    Order Specific Question:   Supervising Provider    Answer:   Unk Pinto [[id number]]   lactobacillus acidophilus & bulgar (LACTINEX) chewable tablet    Sig: Chew 1 tablet by mouth 3 (three) times daily with meals.    Dispense:  90 tablet    Refill:  1    Order Specific Question:   Supervising Provider    Answer:   Unk Pinto [phone]    Notify office for further evaluation and treatment, questions or concerns if any reported s/s fail to improve.   The patient was advised to call back or seek an in-person evaluation if any symptoms worsen or if the condition fails to improve as anticipated.   Further disposition pending results of labs. Discussed med's effects and SE's.    I discussed the assessment and treatment plan with the patient. The patient was provided an opportunity to ask questions and all were answered. The patient agreed with the plan and demonstrated an understanding of the instructions.  Discussed med's effects and SE's.  Screening labs and tests as requested with regular follow-up as recommended.  I provided 20 minutes of face-to-face time during this encounter including counseling, chart review, and critical decision making was preformed.  Outpatient Encounter Medications as of 04/03/2022  Medication Sig   acetaminophen (TYLENOL) 500 MG tablet Take 500 mg by mouth every 6 (six) hours as needed. Takes 0-6 per day depending on her pain level   AMBULATORY NON FORMULARY MEDICATION 1 Scoop daily. Medication Name: Clear Vite-PSF (k-84) Enzyme based, multivitamin, mineral & herbal dietary supplement powder   AMBULATORY NON FORMULARY MEDICATION 1 tablet at bedtime. Medication Name: Marny Lowenstein dietary supplement with proteolytiic enzymes for the gut   AMBULATORY NON FORMULARY MEDICATION 2 tablets daily in the afternoon. Medication Name: Drenamin dietary supplement 3700 for adrenal support   AMBULATORY NON FORMULARY MEDICATION 3 tablets daily in the afternoon. Medication Name: A-F betafood dietary supplement 0825 for gallbladder   Bismuth Subsalicylate (PEPTO-BISMOL PO) Take by mouth as needed.   Ca Carbonate-Mag Hydroxide (ROLAIDS PO) Take 1 each by mouth as needed.   gabapentin (NEURONTIN) 400 MG capsule TAKE 2 CAPSULES(800 MG) BY MOUTH FOUR TIMES DAILY AS NEEDED   Glucosamine-Chondroitin 1500-1200 MG/30ML LIQD Take by mouth.   hydrOXYzine (ATARAX) 50 MG tablet Take 1-2 tablets (50-100 mg total) by mouth every 6 (six) hours as needed for anxiety.   levothyroxine (SYNTHROID) 75 MCG tablet TAKE 1 TABLET DAILY ON EMPTY  STOMACH WITH ONLY WATER FOR 30 MINUTES & NO ANTACID MEDS, CALCIUM OR MAGNESIUM FOR 4 HOURS AND AVOID BIOTIN   LO LOESTRIN FE 1 MG-10 MCG / 10 MCG tablet Take 1 tablet by mouth daily.   MAGNESIUM PO Take 400 mg by mouth daily.   methocarbamol (ROBAXIN-750) 750 MG tablet Take 1 tablet (750 mg total) by mouth every 8 (eight) hours as needed for muscle spasms.   Multiple Vitamins-Minerals (MULTIVITAMIN  ADULTS PO) Take 1 tablet by mouth daily.   promethazine (PHENERGAN) 25 MG tablet TAKE 1 TABLET BY MOUTH EVERY 6 HOURS AS NEEDED FOR NAUSEA OR HEADACHE   sucralfate (CARAFATE) 1 g tablet Take 1 tablet (1 g total) by mouth 4 (four) times daily -  with meals and at bedtime.   temazepam (RESTORIL) 30 MG capsule TAKE 1 CAPSULE BY MOUTH EVERY NIGHT AT BEDTIME AS NEEDED FOR SLEEP   TURMERIC PO Take by mouth.   VITAMIN D PO Take 10,000 Units by mouth daily.   aluminum-magnesium hydroxide-simethicone (MAALOX) 203-559-74 MG/5ML SUSP Take 20 mLs by mouth 3 (three) times daily. (Patient not taking: Reported on 04/03/2022)   dicyclomine (BENTYL) 10 MG capsule Take 1 capsule (10 mg total) by mouth 4 (four) times daily -  before meals and at bedtime. (Patient not taking: Reported on 06/06/2021)   etodolac (LODINE) 400 MG tablet Take 1 tablet (400 mg total) by mouth 2 (two) times daily. (Patient not taking: Reported on 01/16/2022)   methylPREDNISolone (MEDROL) 4 MG tablet Taper from 6 pills po for one day to 1 pill po the last day over 6 days (Patient not taking: Reported on 01/16/2022)   mirabegron ER (MYRBETRIQ) 25 MG TB24 tablet Take 1 tab daily for 1 week then 2 tabs (50 mg) daily for 1 week for bladder symptoms. (Patient not taking: Reported on 03/06/2022)   naproxen sodium (ALEVE) 220 MG tablet Take 220 mg by mouth. (Patient not taking: Reported on 03/06/2022)   Omega 3-6-9 Fatty Acids (OMEGA-3-6-9 PO) Take 1 Dose by mouth daily. (Patient not taking: Reported on 01/16/2022)   psyllium (METAMUCIL) 58.6 % powder Take 1 packet by mouth daily as needed. (Patient not taking: Reported on 03/06/2022)   No facility-administered encounter medications on file as of 04/03/2022.     Future Appointments  Date Time Provider [place]  06/11/2022  3:00 PM Azhane Eckart, Kenney Houseman, NP [facility] None     ------------------------------------------------------------------------------------------------------------------   HPI BP 130/84   Pulse 71   Temp (!) 97.2 F (36.2 C)   Resp 17   Ht 5' 1.25" (1.556 m)   Wt 160 lb (72.6 kg)   SpO2 99%   BMI 29.99 kg/m    She was followed in 12/2021 for c/o odor in her head and body which she felt to be due by laying in bed with a wet head after showering. She was noticing an odor and discoloration on her pillow and has to wash it. She has been using Nizoral shampoo without improvement.  She shares today that she is continuing to have the slight odor to the head although there is some improvement.  She has purchased OTC [employer] to apply.  Started using x 1 day.  She has also just started the Diflucan oral tablet.  Has been taking for the last week and a half but has not noticed much difference to the oral thrush that she has also developed.  Feels as though her BM also smells like "yeast/bread."  She is limiting her  intake of sugars, carbs and starches.     BP is well controlled without medication BP Readings from Last 3 Encounters:  04/03/22 130/84  03/06/22 130/82  01/16/22 124/82    BMI is Body mass index is 29.99 kg/m., she has not been working on diet and exercise. 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 Takes 0-6 per day depending on her pain level   AMBULATORY NON FORMULARY MEDICATION 1 Scoop daily. Medication Name: Clear Vite-PSF (k-84) Enzyme based, multivitamin, mineral & herbal dietary supplement powder   AMBULATORY NON FORMULARY MEDICATION 1 tablet at bedtime. Medication Name: Marny Lowenstein dietary supplement with proteolytiic enzymes for the gut   AMBULATORY NON FORMULARY MEDICATION 2 tablets daily in the afternoon. Medication Name: Drenamin dietary supplement 3700 for adrenal support   AMBULATORY NON FORMULARY MEDICATION 3 tablets daily in the afternoon. Medication Name: A-F betafood dietary supplement 0825 for gallbladder   Bismuth Subsalicylate (PEPTO-BISMOL PO) Take by mouth as needed.   Ca Carbonate-Mag Hydroxide (ROLAIDS PO) Take 1 each by mouth as needed.   gabapentin (NEURONTIN) 400 MG capsule TAKE 2 CAPSULES(800 MG) BY MOUTH FOUR TIMES DAILY AS NEEDED   Glucosamine-Chondroitin 1500-1200 MG/30ML LIQD Take by mouth.   hydrOXYzine (ATARAX) 50 MG tablet Take 1-2 tablets (50-100 mg total) by mouth every 6 (six) hours as needed for anxiety.   levothyroxine (SYNTHROID) 75 MCG tablet TAKE 1 TABLET DAILY ON EMPTY STOMACH WITH ONLY WATER FOR 30 MINUTES & NO ANTACID MEDS, CALCIUM OR MAGNESIUM FOR 4 HOURS AND AVOID BIOTIN   LO LOESTRIN FE 1 MG-10 MCG / 10 MCG tablet Take 1 tablet by mouth daily.   MAGNESIUM PO Take 400 mg by mouth daily.   methocarbamol (ROBAXIN-750) 750 MG tablet Take 1 tablet (750 mg total) by mouth every 8 (eight) hours as needed for muscle spasms.   Multiple Vitamins-Minerals (MULTIVITAMIN ADULTS PO) Take 1 tablet by mouth daily.   promethazine (PHENERGAN) 25 MG tablet TAKE 1 TABLET BY MOUTH EVERY 6 HOURS AS NEEDED FOR NAUSEA OR HEADACHE   sucralfate (CARAFATE) 1 g tablet Take 1 tablet (1 g total) by mouth 4 (four) times daily -  with meals and at bedtime.   temazepam (RESTORIL) 30 MG capsule TAKE 1 CAPSULE BY MOUTH  EVERY NIGHT AT BEDTIME AS NEEDED FOR SLEEP   TURMERIC PO Take by mouth.   VITAMIN D PO Take 10,000 Units by mouth daily.   aluminum-magnesium hydroxide-simethicone (MAALOX) 540-086-76 MG/5ML SUSP Take 20 mLs by mouth 3 (three) times daily. (Patient not taking: Reported on 04/03/2022)   dicyclomine (BENTYL) 10 MG capsule Take 1 capsule (10 mg total) by mouth 4 (four) times daily -  before meals and at bedtime. (Patient not taking:  Reported on 06/06/2021)   etodolac (LODINE) 400 MG tablet Take 1 tablet (400 mg total) by mouth 2 (two) times daily. (Patient not taking: Reported on 01/16/2022)   methylPREDNISolone (MEDROL) 4 MG tablet Taper from 6 pills po for one day to 1 pill po the last day over 6 days (Patient not taking: Reported on 01/16/2022)   mirabegron ER (MYRBETRIQ) 25 MG TB24 tablet Take 1 tab daily for 1 week then 2 tabs (50 mg) daily for 1 week for bladder symptoms. (Patient not taking: Reported on 03/06/2022)   naproxen sodium (ALEVE) 220 MG tablet Take 220 mg by mouth. (Patient not taking: Reported on 03/06/2022)   Omega 3-6-9 Fatty Acids (OMEGA-3-6-9 PO) Take 1 Dose by mouth daily. (Patient not taking: Reported on 01/16/2022)   psyllium (METAMUCIL) 58.6 % powder Take 1 packet by mouth daily as needed. (Patient not taking: Reported on 03/06/2022)   No current facility-administered medications on file prior to visit.    ROS: all negative except above.   Physical Exam:  BP 130/84   Pulse 71   Temp (!) 97.2 F (36.2 C)   Resp 17   Ht 5' 1.25" (1.556 m)   Wt 160 lb (72.6 kg)   SpO2 99%   BMI 29.99 kg/m   General Appearance: Well nourished, in no apparent distress. Eyes: PERRLA, EOMs, conjunctiva no swelling or erythema Sinuses: No Frontal/maxillary tenderness ENT/Mouth: Ext aud canals clear, TMs without erythema, bulging. Oral thrush. No erythema, swelling, or exudate on post pharynx.  Tonsils not swollen or erythematous. Hearing normal.  Neck: Supple, thyroid normal.   Respiratory: Respiratory effort normal, BS equal bilaterally without rales, rhonchi, wheezing or stridor.  Cardio: RRR with no MRGs. Brisk peripheral pulses without edema.  Abdomen: Soft, + BS.  Non tender, no guarding, rebound, hernias, masses. Lymphatics: Non tender without lymphadenopathy.  Musculoskeletal: Full ROM, 5/5 strength, normal gait.  Skin: Warm, dry without rashes, lesions, ecchymosis. No odor noted to scalp on exam Neuro: Cranial nerves intact. Normal muscle tone, no cerebellar symptoms. Sensation intact.  Psych: Awake and oriented X 3, normal affect, Insight and Judgment appropriate.     Darrol Jump, NP 12:08 PM [facility]

## 2022-04-03 NOTE — Patient Instructions (Signed)
Oral Thrush, Adult Oral thrush is an infection in your mouth and throat and on your tongue. It causes white patches to form in your mouth and on your tongue. Many cases of thrush are mild. But, sometimes, thrush can be serious. People who have a weak body defense system (immune system) or other diseases can be affected more. What are the causes? This condition is caused by a type of fungus called yeast. The fungus is normally present in small amounts in the mouth and nose. If a person has a long-term illness or a weak body defense system, the fungus can grow and spread quickly. This causes thrush. What increases the risk? You are more likely to develop this condition if: You have a weak body defense system. You are an older adult. You have diabetes, cancer, or HIV. You have a dry mouth. You are pregnant or breastfeeding. You do not take good care of your teeth. This risk is greater for people who have false teeth (dentures). You use antibiotic or steroid medicines. What are the signs or symptoms? Symptoms of this condition include: A burning feeling in the mouth and throat. White patches that stick to the mouth and tongue. A bad taste in the mouth or trouble tasting foods. A feeling like you have cotton in your mouth. Pain when you eat and swallow. Not wanting to eat as much as usual. Cracking at the corners of the mouth. How is this treated? This condition is treated with medicines called antifungals. These medicines prevent a fungus from growing. The medicines are either put right on the area (topical) or swallowed (oral). Your doctor will also treat other problems that you may have, such as diabetes or HIV. Follow these instructions at home: Helping with pain and soreness To lessen your pain: Drink cold liquids, like water and iced tea. Eat frozen ice pops or frozen juices. Eat foods that are easy to swallow, like gelatin and ice cream. Drink from a straw if you have too much pain  in your mouth.  General instructions Take or use over-the-counter and prescription medicines only as told by your doctor. Eat plain yogurt that has live cultures in it. Read the label to make sure that there are live cultures in your yogurt. If you wear false teeth: Take them out before you go to bed. Brush them well. Soak them in a cleaner. Rinse your mouth with warm salt-water many times a day. To make the salt-water mixture, dissolve -1 teaspoon (3-6 g) of salt in 1 cup (237 mL) of warm water. Contact a doctor if: Your problems do not get better within 7 days of treatment. Your infection is spreading. This may show as white areas on the skin outside of your mouth. You are nursing your baby and you have redness and pain in the nipples. Summary Oral thrush is an infection in your mouth and throat. It is caused by a fungus. You are more likely to get this condition if you have a weak body defense system. Diseases like diabetes, cancer, or HIV also add to your risk. This condition is treated with medicines called antifungals. Contact a doctor if you do not get better within 7 days of starting treatment. This information is not intended to replace advice given to you by your health care provider. Make sure you discuss any questions you have with your health care provider. Document Revised: 11/07/2020 Document Reviewed: 01/14/2019 Elsevier Patient Education  [place].

## 2022-04-08 DIAGNOSIS — H93232 Hyperacusis, left ear: Secondary | ICD-10-CM | POA: Diagnosis not present

## 2022-04-08 DIAGNOSIS — H9202 Otalgia, left ear: Secondary | ICD-10-CM | POA: Diagnosis not present

## 2022-04-24 ENCOUNTER — Other Ambulatory Visit: Payer: Self-pay | Admitting: *Deleted

## 2022-04-24 ENCOUNTER — Encounter: Payer: Self-pay | Admitting: Neurology

## 2022-04-24 ENCOUNTER — Telehealth: Payer: Self-pay | Admitting: Neurology

## 2022-04-24 DIAGNOSIS — M79604 Pain in right leg: Secondary | ICD-10-CM

## 2022-04-24 DIAGNOSIS — M542 Cervicalgia: Secondary | ICD-10-CM

## 2022-04-24 DIAGNOSIS — M5431 Sciatica, right side: Secondary | ICD-10-CM

## 2022-04-24 DIAGNOSIS — M549 Dorsalgia, unspecified: Secondary | ICD-10-CM

## 2022-04-24 DIAGNOSIS — M79601 Pain in right arm: Secondary | ICD-10-CM

## 2022-04-24 DIAGNOSIS — R200 Anesthesia of skin: Secondary | ICD-10-CM

## 2022-04-24 DIAGNOSIS — M5412 Radiculopathy, cervical region: Secondary | ICD-10-CM

## 2022-04-24 NOTE — Telephone Encounter (Signed)
Referral for pain clinic fax to [facility]. Phone: [phone], Fax: [phone].

## 2022-05-01 ENCOUNTER — Encounter: Payer: Self-pay | Admitting: Nurse Practitioner

## 2022-05-01 ENCOUNTER — Ambulatory Visit: Payer: BC Managed Care – PPO | Admitting: Nurse Practitioner

## 2022-05-01 VITALS — BP 136/86 | HR 71 | Temp 97.9°F | Ht 61.25 in | Wt 158.2 lb

## 2022-05-01 DIAGNOSIS — Z79899 Other long term (current) drug therapy: Secondary | ICD-10-CM | POA: Diagnosis not present

## 2022-05-01 DIAGNOSIS — L219 Seborrheic dermatitis, unspecified: Secondary | ICD-10-CM

## 2022-05-01 DIAGNOSIS — R1013 Epigastric pain: Secondary | ICD-10-CM | POA: Diagnosis not present

## 2022-05-01 DIAGNOSIS — K2100 Gastro-esophageal reflux disease with esophagitis, without bleeding: Secondary | ICD-10-CM | POA: Diagnosis not present

## 2022-05-01 DIAGNOSIS — R109 Unspecified abdominal pain: Secondary | ICD-10-CM

## 2022-05-01 DIAGNOSIS — L750 Bromhidrosis: Secondary | ICD-10-CM

## 2022-05-01 MED ORDER — DICYCLOMINE HCL 10 MG PO CAPS: 10.0000 mg | ORAL_CAPSULE | Freq: Three times a day (TID) | ORAL | 2 refills | Status: DC

## 2022-05-01 MED ORDER — FLUCONAZOLE 100 MG PO TABS: 100.0000 mg | ORAL_TABLET | Freq: Every day | ORAL | 0 refills | Status: AC

## 2022-05-01 MED ORDER — OMEPRAZOLE 20 MG PO CPDR: 20.0000 mg | DELAYED_RELEASE_CAPSULE | Freq: Two times a day (BID) | ORAL | 2 refills | Status: DC

## 2022-05-01 NOTE — Progress Notes (Signed)
Assessment and Plan: Charlotte Wise was seen today for acute visit.  Diagnoses and all orders for this visit:  Medication management All medications discussed and reviewed in full. All questions and concerns regarding medications addressed.    Gastroesophageal reflux disease with esophagitis, unspecified whether hemorrhage/Epigastric Pain Continue Colonoscopy Restart medications.  Lifestyle modification:  wt loss, avoid meals 2-3h before bedtime. Consider eliminating food triggers:  chocolate, caffeine, EtOH, acid/spicy food.  - famotidine (PEPCID) 40 MG tablet; Take 40 mg by mouth daily. - omeprazole (PRILOSEC) 20 MG capsule; Take 1 capsule (20 mg total) by mouth 2 (two) times daily before a meal.  Dispense: 60 capsule; Refill: 2  Seborrheic dermatitis of scalp/abnormal body odor Dermatology apt scheduled for 06/2022 Continue Fluconazole  - fluconazole (DIFLUCAN) 100 MG tablet; Take 1 tablet (100 mg total) by mouth daily.  Dispense: 30 tablet; Refill: 0  Abdominal cramping Continue Bentyl Continue f/u with GI Avoid food triggers Continue to monitor  - dicyclomine (BENTYL) 10 MG capsule; Take 1 capsule (10 mg total) by mouth 4 (four) times daily -  before meals and at bedtime.  Dispense: 120 capsule; Refill: 2   Notify office for further evaluation and treatment, questions or concerns if any reported s/s fail to improve.   The patient was advised to call back or seek an in-person evaluation if any symptoms worsen or if the condition fails to improve as anticipated.   Further disposition pending results of labs. Discussed med's effects and SE's.    I discussed the assessment and treatment plan with the patient. The patient was provided an opportunity to ask questions and all were answered. The patient agreed with the plan and demonstrated an understanding of the instructions.  Discussed med's effects and SE's. Screening labs and tests as requested with regular follow-up as  recommended.  I provided 20 minutes of face-to-face time during this encounter including counseling, chart review, and critical decision making was preformed.  Outpatient Encounter Medications as of 05/01/2022  Medication Sig   acetaminophen (TYLENOL) 500 MG tablet Take 500 mg by mouth every 6 (six) hours as needed. Takes 0-6 per day depending on her pain level   AMBULATORY NON FORMULARY MEDICATION 1 Scoop daily. Medication Name: Clear Vite-PSF (k-84) Enzyme based, multivitamin, mineral & herbal dietary supplement powder   AMBULATORY NON FORMULARY MEDICATION 1 tablet at bedtime. Medication Name: Marny Lowenstein dietary supplement with proteolytiic enzymes for the gut   AMBULATORY NON FORMULARY MEDICATION 2 tablets daily in the afternoon. Medication Name: Drenamin dietary supplement 3700 for adrenal support   AMBULATORY NON FORMULARY MEDICATION 3 tablets daily in the afternoon. Medication Name: A-F betafood dietary supplement 0825 for gallbladder   Bismuth Subsalicylate (PEPTO-BISMOL PO) Take by mouth as needed.   Ca Carbonate-Mag Hydroxide (ROLAIDS PO) Take 1 each by mouth as needed.   dicyclomine (BENTYL) 10 MG capsule Take 1 capsule (10 mg total) by mouth 4 (four) times daily -  before meals and at bedtime.   famotidine (PEPCID) 40 MG tablet Take 40 mg by mouth daily.   fluconazole (DIFLUCAN) 100 MG tablet Take 1 tablet (100 mg total) by mouth daily.   gabapentin (NEURONTIN) 400 MG capsule TAKE 2 CAPSULES(800 MG) BY MOUTH FOUR TIMES DAILY AS NEEDED   Glucosamine-Chondroitin 1500-1200 MG/30ML LIQD Take by mouth.   hydrOXYzine (ATARAX) 50 MG tablet Take 1-2 tablets (50-100 mg total) by mouth every 6 (six) hours as needed for anxiety.   lactobacillus acidophilus & bulgar (LACTINEX) chewable tablet Chew 1 tablet by mouth  3 (three) times daily with meals.   levothyroxine (SYNTHROID) 75 MCG tablet TAKE 1 TABLET DAILY ON EMPTY STOMACH WITH ONLY WATER FOR 30 MINUTES & NO ANTACID MEDS, CALCIUM OR  MAGNESIUM FOR 4 HOURS AND AVOID BIOTIN   LO LOESTRIN FE 1 MG-10 MCG / 10 MCG tablet Take 1 tablet by mouth daily.   MAGNESIUM PO Take 400 mg by mouth daily.   methocarbamol (ROBAXIN-750) 750 MG tablet Take 1 tablet (750 mg total) by mouth every 8 (eight) hours as needed for muscle spasms.   Multiple Vitamins-Minerals (MULTIVITAMIN ADULTS PO) Take 1 tablet by mouth daily.   Omega 3-6-9 Fatty Acids (OMEGA-3-6-9 PO) Take 1 Dose by mouth daily.   OVER THE COUNTER MEDICATION Takes Kratom 8 capsules, '600mg'$  each   promethazine (PHENERGAN) 25 MG tablet TAKE 1 TABLET BY MOUTH EVERY 6 HOURS AS NEEDED FOR NAUSEA OR HEADACHE   sucralfate (CARAFATE) 1 g tablet Take 1 tablet (1 g total) by mouth 4 (four) times daily -  with meals and at bedtime.   temazepam (RESTORIL) 30 MG capsule TAKE 1 CAPSULE BY MOUTH EVERY NIGHT AT BEDTIME AS NEEDED FOR SLEEP   VITAMIN D PO Take 10,000 Units by mouth daily.   aluminum-magnesium hydroxide-simethicone (MAALOX) 903-009-23 MG/5ML SUSP Take 20 mLs by mouth 3 (three) times daily. (Patient not taking: Reported on 04/03/2022)   etodolac (LODINE) 400 MG tablet Take 1 tablet (400 mg total) by mouth 2 (two) times daily. (Patient not taking: Reported on 01/16/2022)   methylPREDNISolone (MEDROL) 4 MG tablet Taper from 6 pills po for one day to 1 pill po the last day over 6 days (Patient not taking: Reported on 01/16/2022)   mirabegron ER (MYRBETRIQ) 25 MG TB24 tablet Take 1 tab daily for 1 week then 2 tabs (50 mg) daily for 1 week for bladder symptoms. (Patient not taking: Reported on 03/06/2022)   naproxen sodium (ALEVE) 220 MG tablet Take 220 mg by mouth. (Patient not taking: Reported on 03/06/2022)   nystatin (MYCOSTATIN) 100000 UNIT/ML suspension Swish 5 mL in the mouth and retain for as long as possible (several minutes) before swallowing. (Patient not taking: Reported on 05/01/2022)   psyllium (METAMUCIL) 58.6 % powder Take 1 packet by mouth daily as needed. (Patient not taking:  Reported on 03/06/2022)   TURMERIC PO Take by mouth. (Patient not taking: Reported on 05/01/2022)   No facility-administered encounter medications on file as of 05/01/2022.     Future Appointments  Date Time Provider [place]  06/11/2022  3:00 PM Levin Dagostino, Kenney Houseman, NP [facility] None    ------------------------------------------------------------------------------------------------------------------   HPI BP 136/86   Pulse 71   Temp 97.9 F (36.6 C)   Ht 5' 1.25" (1.556 m)   Wt 158 lb 3.2 oz (71.8 kg)   SpO2 99%   BMI 29.65 kg/m   Recently treated for fungal infection.  Feels as though fluconazole is working for her.  She has an apt scheduled with Dermatology 06/2022.  Of note, she was followed in 12/2021 for c/o odor in her head and body which she felt to be due by laying in bed with a wet head after showering. She was noticing an odor and discoloration on her pillow and has to wash it. She has been using Nizoral shampoo without improvement.  She shares today that she is continuing to have the slight odor to the head although there is some improvement.  She has purchased OTC [employer] to apply.  Started using x 1 day.  She has also just started the Diflucan oral tablet.  Has been taking for the last week and a half but has not noticed much difference to the oral thrush that she has also developed.  Feels as though her BM also smells like "yeast/bread."  She is limiting her intake of sugars, carbs and starches.    She has a hx of GERD.  Controlled by PPI and carafate, however, she has noticed more abdominal cramping and reflux.  Only taking H2 Famotidine which as not helped.  Feeling as though she is needed PPI more often.  She will be making a f/u for EGD and colonoscopy with GI.  She has taken Bentyl in the past which help abd cramping.    BMI is Body mass index is 29.65 kg/m., she has not been working on diet and exercise. 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 Takes 0-6 per day depending on her pain level   AMBULATORY NON FORMULARY MEDICATION 1 Scoop daily. Medication Name: Clear Vite-PSF (k-84) Enzyme based, multivitamin, mineral & herbal dietary supplement powder   AMBULATORY NON FORMULARY MEDICATION 1 tablet at bedtime. Medication Name: Marny Lowenstein dietary supplement with proteolytiic enzymes for the gut   AMBULATORY NON FORMULARY MEDICATION 2 tablets daily in the afternoon. Medication Name: Drenamin dietary supplement 3700 for adrenal support   AMBULATORY NON FORMULARY MEDICATION 3 tablets daily in the afternoon. Medication Name: A-F betafood dietary supplement 0825 for gallbladder   Bismuth Subsalicylate (PEPTO-BISMOL PO) Take by mouth as needed.   Ca Carbonate-Mag Hydroxide (ROLAIDS PO) Take 1 each by mouth as needed.   dicyclomine (BENTYL) 10 MG capsule Take 1 capsule (10 mg total) by mouth 4 (four) times daily -  before meals and at bedtime.   famotidine (PEPCID) 40 MG tablet Take 40 mg by mouth daily.   fluconazole (DIFLUCAN) 100 MG tablet Take 1 tablet (100 mg total) by mouth daily.   gabapentin (NEURONTIN) 400 MG capsule TAKE 2 CAPSULES(800 MG) BY MOUTH FOUR TIMES DAILY AS NEEDED   Glucosamine-Chondroitin 1500-1200 MG/30ML LIQD Take by mouth.   hydrOXYzine (ATARAX) 50 MG  tablet Take 1-2 tablets (50-100 mg total) by mouth every 6 (six) hours as needed for anxiety.   lactobacillus acidophilus & bulgar (LACTINEX) chewable tablet Chew 1 tablet by mouth 3 (three) times daily with meals.   levothyroxine (SYNTHROID) 75 MCG tablet TAKE 1 TABLET DAILY ON EMPTY STOMACH WITH ONLY WATER FOR 30 MINUTES & NO ANTACID MEDS, CALCIUM OR MAGNESIUM FOR 4 HOURS AND AVOID BIOTIN   LO LOESTRIN FE 1 MG-10 MCG / 10 MCG tablet Take 1 tablet by mouth daily.   MAGNESIUM PO Take 400 mg by mouth daily.   methocarbamol (ROBAXIN-750) 750 MG  tablet Take 1 tablet (750 mg total) by mouth every 8 (eight) hours as needed for muscle spasms.   Multiple Vitamins-Minerals (MULTIVITAMIN ADULTS PO) Take 1 tablet by mouth daily.   Omega 3-6-9 Fatty Acids (OMEGA-3-6-9 PO) Take 1 Dose by mouth daily.   OVER THE COUNTER MEDICATION Takes Kratom 8 capsules, '600mg'$  each   promethazine (PHENERGAN) 25 MG tablet TAKE 1 TABLET BY MOUTH EVERY 6 HOURS AS NEEDED FOR NAUSEA OR HEADACHE   sucralfate (CARAFATE) 1 g tablet Take 1 tablet (1 g total) by mouth 4 (four) times daily -  with meals and at bedtime.   temazepam (RESTORIL) 30 MG capsule TAKE 1 CAPSULE BY MOUTH EVERY NIGHT AT BEDTIME AS NEEDED FOR SLEEP   VITAMIN D PO Take 10,000 Units by mouth daily.   aluminum-magnesium hydroxide-simethicone (MAALOX) 342-876-81 MG/5ML SUSP Take 20 mLs by mouth 3 (three) times daily. (Patient not taking: Reported on 04/03/2022)   etodolac (LODINE) 400 MG tablet Take 1 tablet (400 mg total) by mouth 2 (two) times daily. (Patient not taking: Reported on 01/16/2022)   methylPREDNISolone (MEDROL) 4 MG tablet Taper from 6 pills po for one day to 1 pill po the last day over 6 days (Patient not taking: Reported on 01/16/2022)   mirabegron ER (MYRBETRIQ) 25 MG TB24 tablet Take 1 tab daily for 1 week then 2 tabs (50 mg) daily for 1 week for bladder symptoms. (Patient not taking: Reported on 03/06/2022)   naproxen sodium (ALEVE) 220 MG tablet Take  220 mg by mouth. (Patient not taking: Reported on 03/06/2022)   nystatin (MYCOSTATIN) 100000 UNIT/ML suspension Swish 5 mL in the mouth and retain for as long as possible (several minutes) before swallowing. (Patient not taking: Reported on 05/01/2022)   psyllium (METAMUCIL) 58.6 % powder Take 1 packet by mouth daily as needed. (Patient not taking: Reported on 03/06/2022)   TURMERIC PO Take by mouth. (Patient not taking: Reported on 05/01/2022)   No current facility-administered medications on file prior to visit.    ROS: all negative except above.   Physical Exam:  BP 136/86   Pulse 71   Temp 97.9 F (36.6 C)   Ht 5' 1.25" (1.556 m)   Wt 158 lb 3.2 oz (71.8 kg)   SpO2 99%   BMI 29.65 kg/m   General Appearance: Well nourished, in no apparent distress. Eyes: PERRLA, EOMs, conjunctiva no swelling or erythema Sinuses: No Frontal/maxillary tenderness ENT/Mouth: Ext aud canals clear, TMs without erythema, bulging. Oral thrush. No erythema, swelling, or exudate on post pharynx.  Tonsils not swollen or erythematous. Hearing normal.  Neck: Supple, thyroid normal.  Respiratory: Respiratory effort normal, BS equal bilaterally without rales, rhonchi, wheezing or stridor.  Cardio: RRR with no MRGs. Brisk peripheral pulses without edema.  Abdomen: Soft, + BS.  Non tender, no guarding, rebound, hernias, masses. Lymphatics: Non tender without lymphadenopathy.  Musculoskeletal: Full ROM, 5/5 strength, normal gait.  Skin: Warm, dry without rashes, lesions, ecchymosis. No odor noted to scalp on exam Neuro: Cranial nerves intact. Normal muscle tone, no cerebellar symptoms. Sensation intact.  Psych: Awake and oriented X 3, normal affect, Insight and Judgment appropriate.     Darrol Jump, NP 2:58 PM [facility]

## 2022-05-02 ENCOUNTER — Encounter: Payer: Self-pay | Admitting: Nurse Practitioner

## 2022-05-05 IMAGING — MG DIGITAL SCREENING BILAT W/ TOMO W/ CAD
8 series · 8 of 24 positions shown · non-contrast
Comparison: Previous exam(s).

CLINICAL DATA: Screening.

EXAM:
DIGITAL SCREENING BILATERAL MAMMOGRAM WITH TOMO AND CAD

[L CC synth-2D]
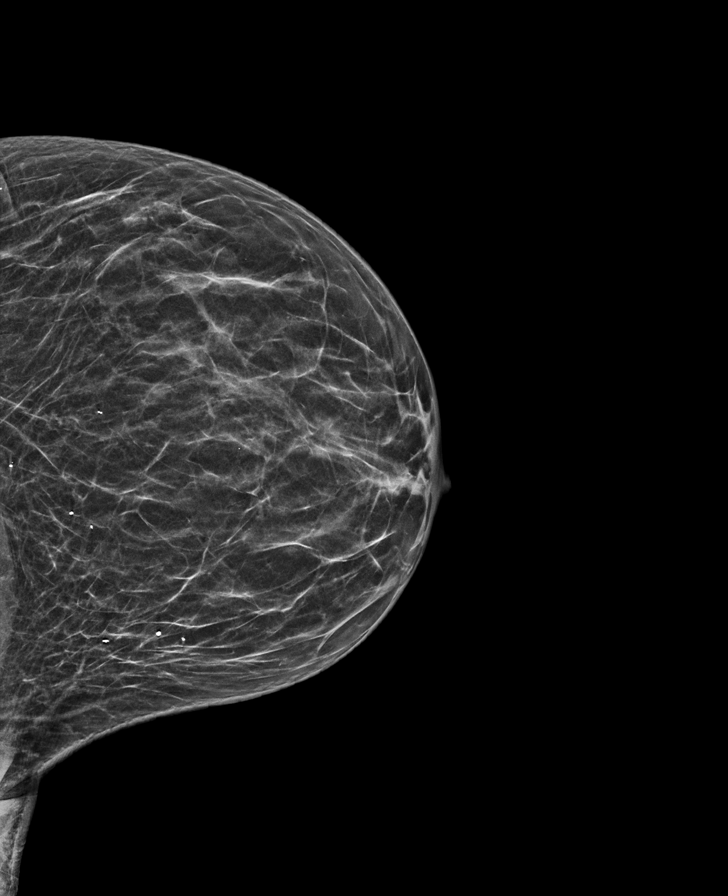

[R CC synth-2D]
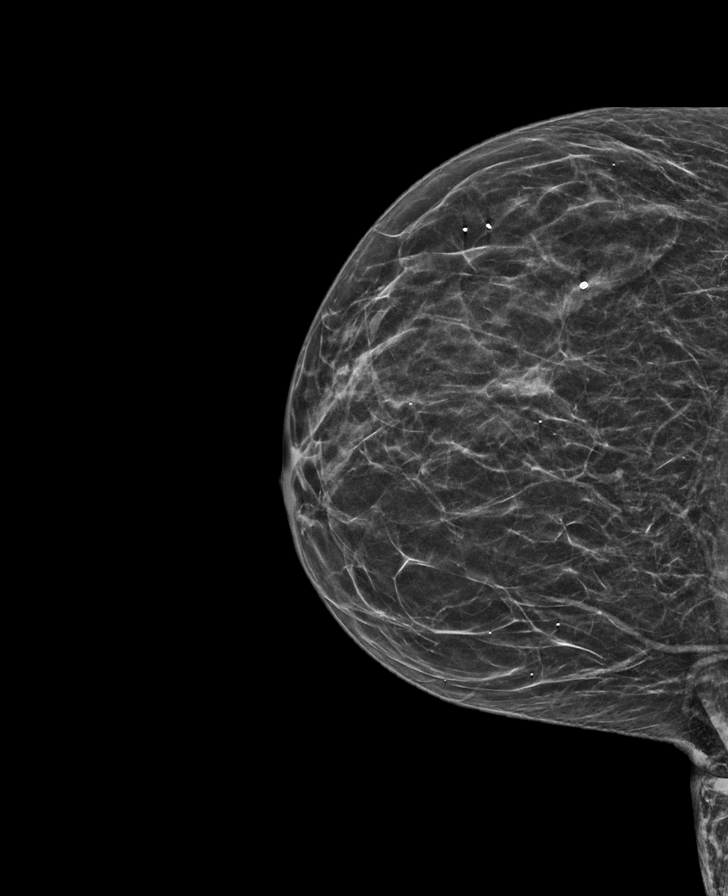

[L MLO synth-2D]
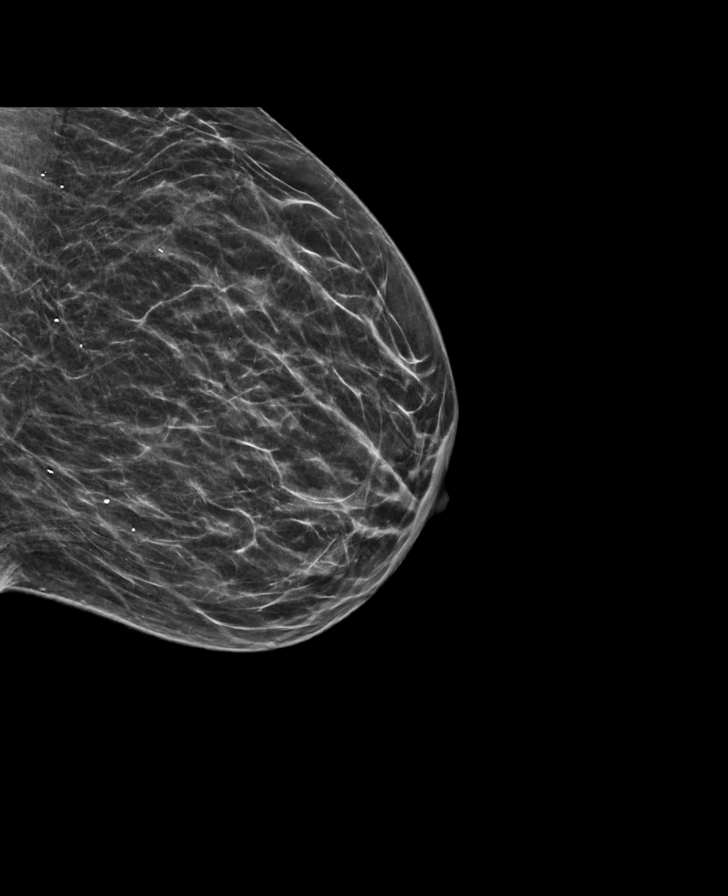

[R MLO synth-2D]
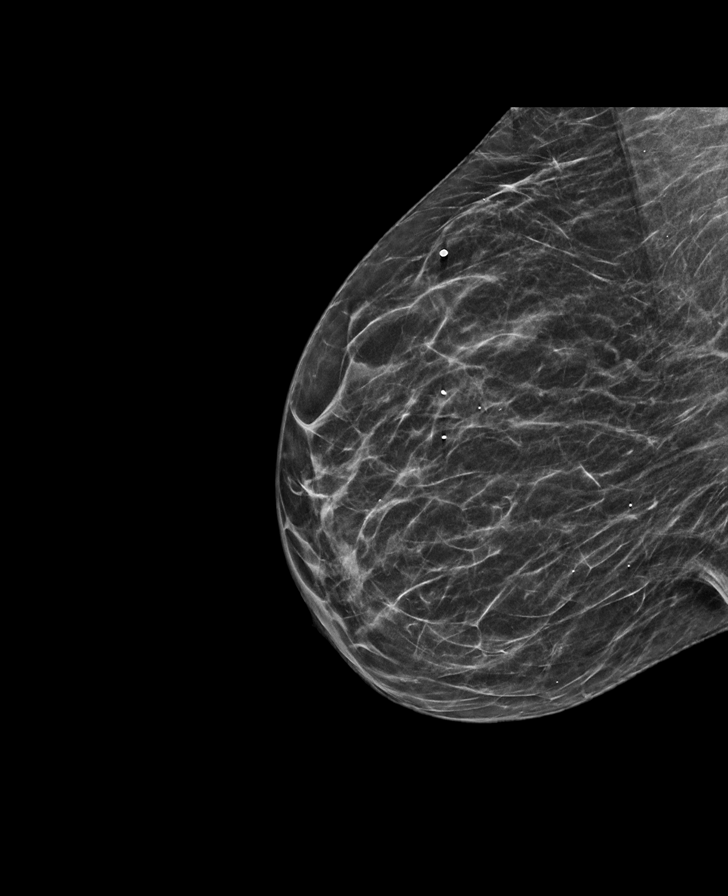

[L MLO tomo · tomo slice 25/50.0]
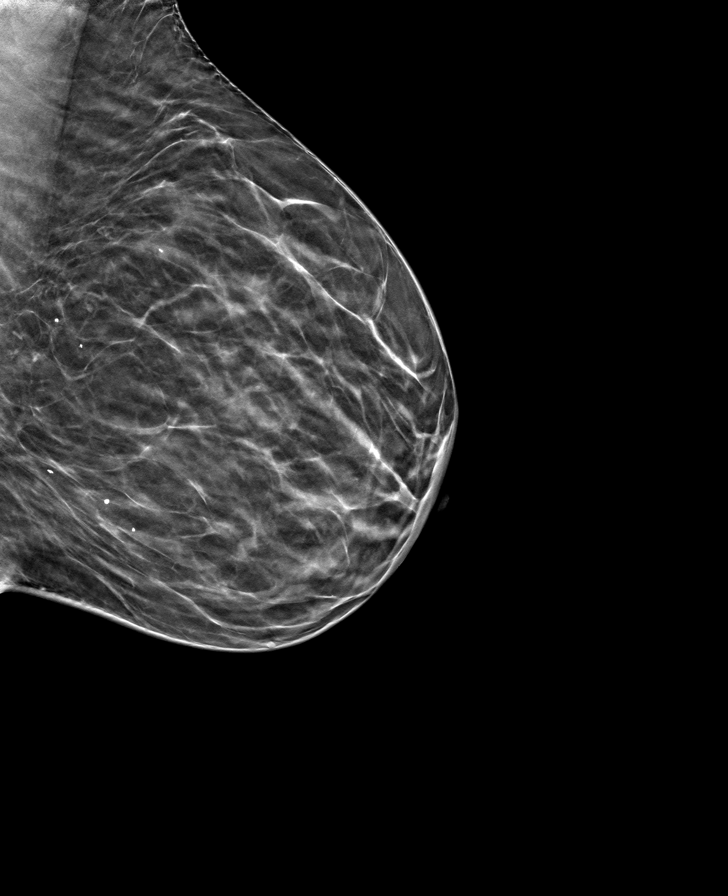

[L CC tomo · tomo slice 25/49.0]
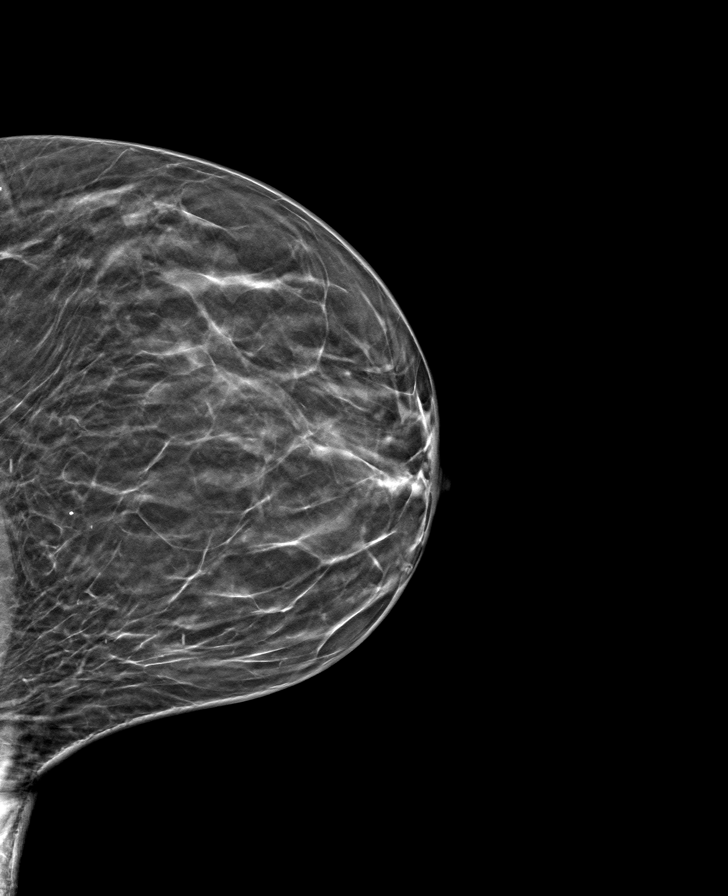

[R CC tomo · tomo slice 24/47.0]
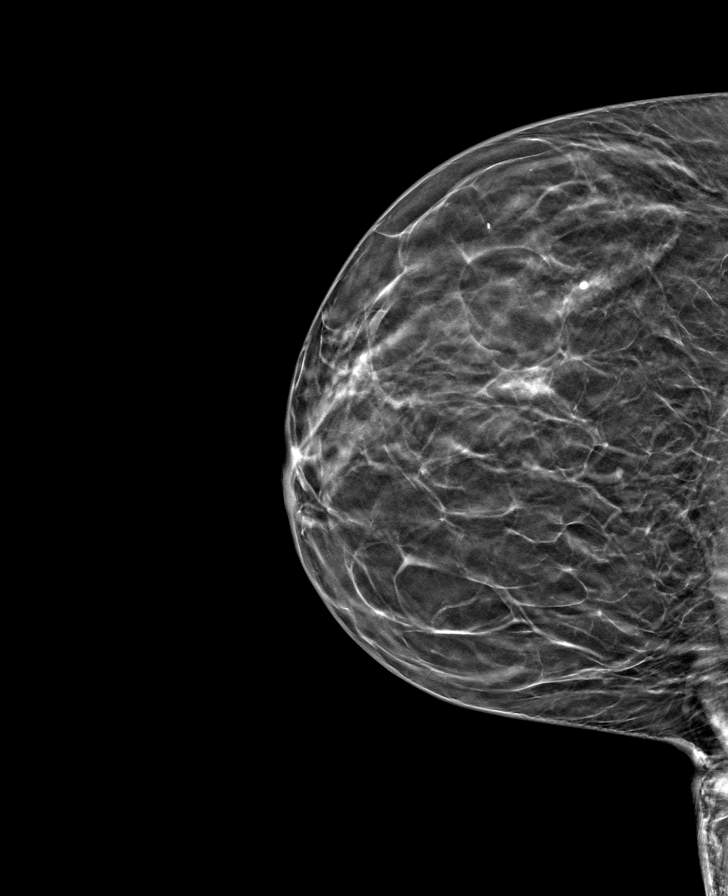

[R MLO tomo · tomo slice 27/52.0]
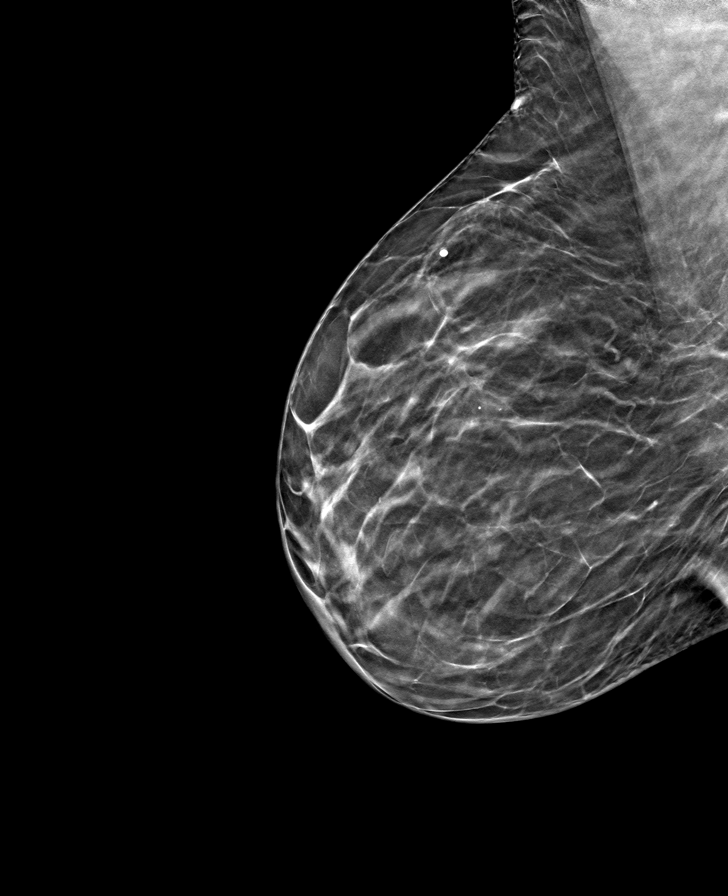

[8 of 24 positions shown; findings below may reference images not displayed]

ACR Breast Density Category b: There are scattered areas of
fibroglandular density.
FINDINGS: There are no findings suspicious for malignancy. Images were
processed with CAD.
IMPRESSION: No mammographic evidence of malignancy. A result letter of this
screening mammogram will be mailed directly to the patient.

RECOMMENDATION:
Screening mammogram in one year. (Code:[id number])

BI-RADS CATEGORY  1: Negative.

## 2022-05-05 MED ORDER — FAMOTIDINE 20 MG PO TABS: ORAL_TABLET | ORAL | 2 refills | Status: DC

## 2022-05-08 DIAGNOSIS — M5412 Radiculopathy, cervical region: Secondary | ICD-10-CM | POA: Diagnosis not present

## 2022-05-09 ENCOUNTER — Other Ambulatory Visit: Payer: Self-pay | Admitting: Physical Medicine & Rehabilitation

## 2022-05-09 DIAGNOSIS — M5412 Radiculopathy, cervical region: Secondary | ICD-10-CM

## 2022-05-14 ENCOUNTER — Other Ambulatory Visit: Payer: Self-pay | Admitting: Obstetrics and Gynecology

## 2022-05-14 DIAGNOSIS — Z1231 Encounter for screening mammogram for malignant neoplasm of breast: Secondary | ICD-10-CM

## 2022-05-20 DIAGNOSIS — Z6830 Body mass index (BMI) 30.0-30.9, adult: Secondary | ICD-10-CM | POA: Diagnosis not present

## 2022-05-20 DIAGNOSIS — M542 Cervicalgia: Secondary | ICD-10-CM | POA: Diagnosis not present

## 2022-05-22 DIAGNOSIS — Z79891 Long term (current) use of opiate analgesic: Secondary | ICD-10-CM | POA: Diagnosis not present

## 2022-05-22 DIAGNOSIS — M5412 Radiculopathy, cervical region: Secondary | ICD-10-CM | POA: Diagnosis not present

## 2022-05-24 ENCOUNTER — Ambulatory Visit
Admission: RE | Admit: 2022-05-24 | Discharge: 2022-05-24 | Disposition: A | Discharge status: Home / Self Care | Payer: BC Managed Care – PPO | Source: Ambulatory Visit | Attending: Physical Medicine & Rehabilitation | Admitting: Physical Medicine & Rehabilitation

## 2022-05-24 DIAGNOSIS — M5412 Radiculopathy, cervical region: Secondary | ICD-10-CM

## 2022-05-24 DIAGNOSIS — M4802 Spinal stenosis, cervical region: Secondary | ICD-10-CM | POA: Diagnosis not present

## 2022-06-02 DIAGNOSIS — M5412 Radiculopathy, cervical region: Secondary | ICD-10-CM | POA: Diagnosis not present

## 2022-06-03 DIAGNOSIS — M5013 Cervical disc disorder with radiculopathy, cervicothoracic region: Secondary | ICD-10-CM | POA: Diagnosis not present

## 2022-06-03 DIAGNOSIS — M545 Low back pain, unspecified: Secondary | ICD-10-CM | POA: Diagnosis not present

## 2022-06-04 ENCOUNTER — Ambulatory Visit: Payer: BC Managed Care – PPO | Admitting: Gastroenterology

## 2022-06-04 ENCOUNTER — Telehealth: Payer: Self-pay | Admitting: Gastroenterology

## 2022-06-04 NOTE — Telephone Encounter (Signed)
Good Afternoon Dr. Bryan Lemma,  Patient called stating that she was not going to make it to her appointment with you at 1:40 because she went to the [facility] location.   Patient was rescheduled for 5/16 at 9:00.

## 2022-06-08 ENCOUNTER — Other Ambulatory Visit: Payer: Self-pay | Admitting: Nurse Practitioner

## 2022-06-09 ENCOUNTER — Encounter: Payer: Self-pay | Admitting: Nurse Practitioner

## 2022-06-09 ENCOUNTER — Ambulatory Visit (INDEPENDENT_AMBULATORY_CARE_PROVIDER_SITE_OTHER): Payer: BC Managed Care – PPO | Admitting: Nurse Practitioner

## 2022-06-09 VITALS — BP 128/78 | HR 75 | Temp 98.1°F | Ht 61.25 in | Wt 158.2 lb

## 2022-06-09 DIAGNOSIS — E785 Hyperlipidemia, unspecified: Secondary | ICD-10-CM

## 2022-06-09 DIAGNOSIS — Z86010 Personal history of colonic polyps: Secondary | ICD-10-CM

## 2022-06-09 DIAGNOSIS — Z1389 Encounter for screening for other disorder: Secondary | ICD-10-CM | POA: Diagnosis not present

## 2022-06-09 DIAGNOSIS — Z13220 Encounter for screening for lipoid disorders: Secondary | ICD-10-CM | POA: Diagnosis not present

## 2022-06-09 DIAGNOSIS — Z79899 Other long term (current) drug therapy: Secondary | ICD-10-CM

## 2022-06-09 DIAGNOSIS — K2100 Gastro-esophageal reflux disease with esophagitis, without bleeding: Secondary | ICD-10-CM

## 2022-06-09 DIAGNOSIS — E559 Vitamin D deficiency, unspecified: Secondary | ICD-10-CM

## 2022-06-09 DIAGNOSIS — Z6828 Body mass index (BMI) 28.0-28.9, adult: Secondary | ICD-10-CM

## 2022-06-09 DIAGNOSIS — M4802 Spinal stenosis, cervical region: Secondary | ICD-10-CM

## 2022-06-09 DIAGNOSIS — F2081 Schizophreniform disorder: Secondary | ICD-10-CM

## 2022-06-09 DIAGNOSIS — Z136 Encounter for screening for cardiovascular disorders: Secondary | ICD-10-CM | POA: Diagnosis not present

## 2022-06-09 DIAGNOSIS — Z0000 Encounter for general adult medical examination without abnormal findings: Secondary | ICD-10-CM

## 2022-06-09 DIAGNOSIS — Z131 Encounter for screening for diabetes mellitus: Secondary | ICD-10-CM

## 2022-06-09 DIAGNOSIS — E038 Other specified hypothyroidism: Secondary | ICD-10-CM

## 2022-06-09 DIAGNOSIS — Z130 Encounter for screening for diseases of the blood and blood-forming organs and certain disorders involving the immune mechanism: Secondary | ICD-10-CM | POA: Diagnosis not present

## 2022-06-09 DIAGNOSIS — I10 Essential (primary) hypertension: Secondary | ICD-10-CM | POA: Diagnosis not present

## 2022-06-09 DIAGNOSIS — E538 Deficiency of other specified B group vitamins: Secondary | ICD-10-CM

## 2022-06-09 DIAGNOSIS — Z0001 Encounter for general adult medical examination with abnormal findings: Secondary | ICD-10-CM

## 2022-06-09 DIAGNOSIS — L219 Seborrheic dermatitis, unspecified: Secondary | ICD-10-CM

## 2022-06-09 DIAGNOSIS — F419 Anxiety disorder, unspecified: Secondary | ICD-10-CM

## 2022-06-09 MED ORDER — ZINC 50 MG PO TABS: ORAL_TABLET | ORAL | 0 refills | Status: AC

## 2022-06-09 NOTE — Patient Instructions (Signed)

## 2022-06-09 NOTE — Progress Notes (Signed)
Complete Physical  Assessment and Plan:  Diagnoses and all orders for this visit:  Encounter for Annual Physical Exam with abnormal findings Due annually  Health Maintenance reviewed Healthy lifestyle reviewed and goals set  Schizophreniform disorder [facility]) Previously managed by Dr. Stephanie Acre; no longer on medications, declined further follow up  Hypothyroidism, unspecified type Controlled. Continue Levothyroxine. Reminded to take on an empty stomach 30-102mins before food.  Stop any Biotin Supplement 48-72 hours before next TSH level to reduce the risk of falsely low TSH levels. Continue to monitor.     Vitamin D deficiency Continue with supplementation for goal of 70-100 -     VITAMIN D 25 Hydroxy (Vit-D Deficiency, Fractures)  Hyperlipdemia Discussed lifestyle modifications. Recommended diet heavy in fruits and veggies, omega 3's. Decrease consumption of animal meats, cheeses, and dairy products. Remain active and exercise as tolerated. Continue to monitor. Check lipids/TSH   Screening for diabetes mellitus -     Hemoglobin A1c   Medication management All medications discussed and reviewed in full. All questions and concerns regarding medications addressed.    BMI 28 Discussed appropriate BMI Diet modification. Physical activity. Encouraged/praised to build confidence.  Anxiety Currently off of meds per patient preference; using CBD Stress management techniques discussed, increase water, good sleep hygiene discussed, increase exercise, and increase veggies.   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 Consider eliminating food triggers:  chocolate, caffeine, EtOH, acid/spicy  food.  Screening for blood or protein in urine Check and monitor Urinalysis/Microalbumin Routine w reflex microscopic  Screening for IDA Monitor CBC, iron, tibc, ferritin  Screening for Cardiovascular Condition  Monitor EKG  B12 deficiency Continue supplement  Orders Placed This Encounter  Procedures   CBC with Differential/Platelet   COMPLETE METABOLIC PANEL WITH GFR   Magnesium   Lipid panel   TSH   Hemoglobin A1c   Insulin, random   VITAMIN D 25 Hydroxy (Vit-D Deficiency, Fractures)   Urinalysis, Routine w reflex microscopic   Microalbumin / creatinine urine ratio   Vitamin B12   Iron, TIBC and Ferritin Panel   EKG 12-Lead    Notify office for further evaluation and treatment, questions or concerns if any reported s/s fail to improve.   The patient was advised to call back or seek an in-person evaluation if any symptoms worsen or if the condition fails to improve as anticipated.   Further disposition pending results of labs. Discussed med's effects and SE's.    I discussed the assessment and treatment plan with the patient. The patient was provided an opportunity to ask questions and all were answered. The patient agreed with the plan and demonstrated an understanding of the instructions.  Discussed med's effects and SE's. Screening labs and tests as requested with regular follow-up as recommended.  I provided 40 minutes of face-to-face time during this encounter including counseling, chart review, and critical decision making was preformed.  Today's Plan of Care is based on a patient-centered health care approach known as shared decision making - the decisions, tests and treatments allow for patient preferences and values to be balanced with clinical evidence.     Future Appointments  Date Time Provider [place]  08/07/2022  9:00 AM Lavena Bullion, DO [facility]  08/13/2022  8:20 AM GI-[facility]  MM 2 [facility]  06/09/2023  3:00 PM  Harmony Sandell, Kenney Houseman, NP [facility] None    HPI  47 y.o. female  presents for a complete physical and follow up for has Schizophreniform disorder ([place]); Hypothyroid; Vitamin D deficiency; BMI 28.0-28.9,adult; Insomnia; Hyperlipidemia; Major depression in remission ([place]); Neck pain; Sciatica of right side; Hyperreflexia; Mid back pain; Family history of MS (multiple sclerosis); Pain of right lower extremity; Right arm pain; Anxiety; History of adenomatous polyp of colon; and Cervical spinal stenosis on their problem list.   She is married, 1 son, in [place].  She is an Chief Financial Officer by trade, not currently working but actively looking for a job.   Followed annually by GYN at [facility], PAP smears, on HBC, getting mammogram annually at breast center.  She was previously followed by Dr. Stephanie Acre for schizophreniform disorder, and was seeing [name] (counsellor) regularly as well in 2018, but reports she has tapered off of medications and was doing well. She does not believe the diagnoses was accurate.   She was referred to neuro for neck and back pain, MRI in 2021 showed cervical spinal stenosis mild/mod at C6-7 with radicular sx of extremities intermittently, R>L. No surgery recommended at this point, following up q71m by neuro, managing with tylenol, gabapentin, CBD oil, natural supplements. She followed up with [facility]surgery 03/13/22 for neck pain.  Doing fairly well with improvement in her neck and back pain with PT.    She was having anxiety, buspar not helpful, was taking vistaril/PRN alprazolam but has tapered off.   She is prescribed temazepam PRN for insomnia.    BMI is Body mass index is 29.65 kg/m., she is working on diet and exercise.  Wt Readings from Last 3 Encounters:  06/09/22 158 lb 3.2 oz (71.8 kg)  05/01/22 158 lb 3.2 oz (71.8 kg)  04/03/22 160 lb (72.6 kg)   Her blood pressure has been controlled at home (110/70), today their BP is BP: 128/78 She does workout. She  denies chest pain, shortness of breath, dizziness.   She is not on cholesterol medication. Her cholesterol is not at goal. The cholesterol last visit was:   Lab Results  Component Value Date   CHOL 213 (H) 03/06/2022   HDL 64 03/06/2022   LDLCALC 128 (H) 03/06/2022   TRIG 100 03/06/2022   CHOLHDL 3.3 03/06/2022    Last A1C in the office was:  Lab Results  Component Value Date   HGBA1C 5.2 06/06/2021   She is on thyroid medication. Her medication was not changed last visit. Take 75 mcg daily, 1/2 tab on Mon/Thurs.   Lab Results  Component Value Date   TSH 1.06 03/06/2022   Last GFR: Lab Results  Component Value Date   GFRNONAA 94 08/07/2020   Patient is on Vitamin D supplement, admits not as regularly Lab Results  Component Value Date   VD25OH 44 06/06/2021     She continues to be concerned for GERD symptoms, feels it may be possible yeast.  Was treated with Diflucan oral for 4 weeks with improvement.  Unable to tolerate heavy foods, eats light meals, some associated nausea and bloating.  Denies blood in stool.   She had colonoscopy scheduled for 04/2022 but had to reschedule apt.  Now has f/u 07/2022.     Current Medications:  Current Outpatient Medications on File Prior to Visit  Medication Sig Dispense Refill   acetaminophen (TYLENOL) 500 MG tablet Take 500 mg by mouth every 6 (six)  hours as needed. Takes 0-6 per day depending on her pain level     aluminum-magnesium hydroxide-simethicone (MAALOX) 200-200-20 MG/5ML SUSP Take 20 mLs by mouth 3 (three) times daily.     AMBULATORY NON FORMULARY MEDICATION 1 Scoop daily. Medication Name: Clear Vite-PSF (k-84) Enzyme based, multivitamin, mineral & herbal dietary supplement powder     AMBULATORY NON FORMULARY MEDICATION 1 tablet at bedtime. Medication Name: Marny Lowenstein dietary supplement with proteolytiic enzymes for the gut     AMBULATORY NON FORMULARY MEDICATION 2 tablets daily in the afternoon. Medication Name: Drenamin  dietary supplement 3700 for adrenal support     AMBULATORY NON FORMULARY MEDICATION 3 tablets daily in the afternoon. Medication Name: A-F betafood dietary supplement 0825 for gallbladder     Bismuth Subsalicylate (PEPTO-BISMOL PO) Take by mouth as needed.     Ca Carbonate-Mag Hydroxide (ROLAIDS PO) Take 1 each by mouth as needed.     dicyclomine (BENTYL) 10 MG capsule Take 1 capsule (10 mg total) by mouth 4 (four) times daily -  before meals and at bedtime. 120 capsule 2   famotidine (PEPCID) 20 MG tablet Take 1 tablet 2 x /day with meals for Acid Indigestion 60 tablet 2   gabapentin (NEURONTIN) 400 MG capsule TAKE 2 CAPSULES(800 MG) BY MOUTH FOUR TIMES DAILY AS NEEDED 240 capsule 3   Glucosamine-Chondroitin 1500-1200 MG/30ML LIQD Take by mouth.     hydrOXYzine (ATARAX) 50 MG tablet Take 1-2 tablets (50-100 mg total) by mouth every 6 (six) hours as needed for anxiety. 120 tablet 0   Ibuprofen 200 MG CAPS Take by mouth daily.     lactobacillus acidophilus & bulgar (LACTINEX) chewable tablet Chew 1 tablet by mouth 3 (three) times daily with meals. 90 tablet 1   levothyroxine (SYNTHROID) 75 MCG tablet TAKE 1 TABLET DAILY ON EMPTY STOMACH WITH ONLY WATER FOR 30 MINUTES & NO ANTACID MEDS, CALCIUM OR MAGNESIUM FOR 4 HOURS AND AVOID BIOTIN 90 tablet 3   LO LOESTRIN FE 1 MG-10 MCG / 10 MCG tablet Take 1 tablet by mouth daily.     MAGNESIUM PO Take 400 mg by mouth daily.     methocarbamol (ROBAXIN-750) 750 MG tablet Take 1 tablet (750 mg total) by mouth every 8 (eight) hours as needed for muscle spasms. 60 tablet 1   Multiple Vitamins-Minerals (MULTIVITAMIN ADULTS PO) Take 1 tablet by mouth daily.     Omega 3-6-9 Fatty Acids (OMEGA-3-6-9 PO) Take 1 Dose by mouth daily.     omeprazole (PRILOSEC) 20 MG capsule Take 1 capsule (20 mg total) by mouth 2 (two) times daily before a meal. 60 capsule 2   promethazine (PHENERGAN) 25 MG tablet TAKE 1 TABLET BY MOUTH EVERY 6 HOURS AS NEEDED FOR NAUSEA OR HEADACHE 30  tablet 0   psyllium (METAMUCIL) 58.6 % powder Take 1 packet by mouth daily as needed.     sucralfate (CARAFATE) 1 g tablet Take 1 tablet (1 g total) by mouth 4 (four) times daily -  with meals and at bedtime. 360 tablet 0   temazepam (RESTORIL) 30 MG capsule TAKE 1 CAPSULE BY MOUTH EVERY NIGHT AT BEDTIME AS NEEDED FOR SLEEP 30 capsule 0   TURMERIC PO Take by mouth.     VITAMIN D PO Take 10,000 Units by mouth daily.     etodolac (LODINE) 400 MG tablet Take 1 tablet (400 mg total) by mouth 2 (two) times daily. (Patient not taking: Reported on 01/16/2022) 60 tablet 5   methylPREDNISolone (MEDROL)  4 MG tablet Taper from 6 pills po for one day to 1 pill po the last day over 6 days (Patient not taking: Reported on 01/16/2022) 21 tablet 0   mirabegron ER (MYRBETRIQ) 25 MG TB24 tablet Take 1 tab daily for 1 week then 2 tabs (50 mg) daily for 1 week for bladder symptoms. (Patient not taking: Reported on 03/06/2022) 21 tablet 0   naproxen sodium (ALEVE) 220 MG tablet Take 220 mg by mouth. (Patient not taking: Reported on 03/06/2022)     nystatin (MYCOSTATIN) 100000 UNIT/ML suspension Swish 5 mL in the mouth and retain for as long as possible (several minutes) before swallowing. (Patient not taking: Reported on 05/01/2022) 60 mL 1   OVER THE COUNTER MEDICATION Takes Kratom 8 capsules, 600mg  each (Patient not taking: Reported on 06/09/2022)     No current facility-administered medications on file prior to visit.   Allergies:  Allergies  Allergen Reactions   Hydrocodone Itching   Oxycodone-Acetaminophen    Penicillins Swelling   Medical History:  She has Schizophreniform disorder ([place]); Hypothyroid; Vitamin D deficiency; BMI 28.0-28.9,adult; Insomnia; Hyperlipidemia; Major depression in remission ([place]); Neck pain; Sciatica of right side; Hyperreflexia; Mid back pain; Family history of MS (multiple sclerosis); Pain of right lower extremity; Right arm pain; Anxiety; History of adenomatous polyp of colon; and  Cervical spinal stenosis on their problem list. Health Maintenance:   Immunization History  Administered Date(s) Administered   Influenza,inj,quad, With Preservative 01/17/2016   Tdap 05/18/2020   Health Maintenance  Topic Date Due   COVID-19 Vaccine (1) Never done   INFLUENZA VACCINE  10/22/2021   COLONOSCOPY (Pts 45-9yrs Insurance coverage will need to be confirmed)  05/02/2023   PAP SMEAR-Modifier  07/31/2025   DTaP/Tdap/Td (2 - Td or Tdap) 05/18/2030   HPV VACCINES  Aged Out   Hepatitis C Screening  Discontinued   HIV Screening  Discontinued     Flu vaccine: 2017, declines  LMP: No LMP recorded. Patient is perimenopausal. Pap:    By GYN - annually  MGM:  By GYN - annually   Head CT: 04/18/2016  Last Dental Exam: Dr. Ma Rings, 2023- goes q6 Last Eye Exam: Annually, last 2023, planning 2 year follow up, glasses  Patient Care Team: Unk Pinto, MD as PCP - General (Internal Medicine)  Surgical History:  She has a past surgical history that includes Tonsillectomy and adenoidectomy (1981); Knee arthroscopy (Left, 1988); Knee arthroscopy (Right, 1999); Ankle arthroscopy (Right, 2000); laparoscopy (2001); Nasal polyp surgery (2006); Colonoscopy (05/01/2020); and Upper gastrointestinal endoscopy (05/01/2020). Family History:  Herfamily history includes Alcohol abuse in her brother, father, and mother; Clotting disorder in her mother; Drug abuse in her brother, father, and mother; Heart disease in her brother and paternal grandfather; Kidney disease in her maternal grandfather; Other in her mother; Stroke in her paternal grandfather; Thyroid cancer in her maternal grandmother. Social History:  She reports that she has never smoked. She has never used smokeless tobacco. She reports that she does not currently use alcohol. She reports that she does not use drugs.  Review of Systems: Review of Systems  Constitutional:  Negative for malaise/fatigue and weight loss.  HENT:   Negative for hearing loss and tinnitus.   Eyes:  Negative for blurred vision and double vision.  Respiratory:  Negative for cough, shortness of breath and wheezing.   Cardiovascular:  Negative for chest pain, palpitations, orthopnea, claudication and leg swelling.  Gastrointestinal:  Negative for abdominal pain, blood in stool, constipation, diarrhea,  heartburn (improving), melena, nausea and vomiting.       Bloating  Genitourinary: Negative.   Musculoskeletal:  Positive for neck pain. Negative for back pain, joint pain and myalgias.  Skin:  Negative for rash.  Neurological:  Negative for dizziness, tingling, sensory change, weakness and headaches (improved).  Endo/Heme/Allergies:  Negative for environmental allergies and polydipsia.  Psychiatric/Behavioral:  Negative for depression, hallucinations, memory loss, substance abuse and suicidal ideas. The patient is nervous/anxious. The patient does not have insomnia.   All other systems reviewed and are negative.   Physical Exam: Estimated body mass index is 29.65 kg/m as calculated from the following:   Height as of this encounter: 5' 1.25" (1.556 m).   Weight as of this encounter: 158 lb 3.2 oz (71.8 kg). BP 128/78   Pulse 75   Temp 98.1 F (36.7 C)   Ht 5' 1.25" (1.556 m)   Wt 158 lb 3.2 oz (71.8 kg)   SpO2 99%   BMI 29.65 kg/m  General Appearance: Well nourished, in no apparent distress.  Eyes: PERRLA, EOMs, conjunctiva no swelling or erythema Sinuses: No Frontal/maxillary tenderness  ENT/Mouth: Ext aud canals clear, normal light reflex with TMs without erythema, bulging. Good dental hygiene, oral MM intact. Hearing normal.  Neck: Supple, thyroid normal. No bruits  Respiratory: Respiratory effort normal, BS equal bilaterally without rales, rhonchi, wheezing or stridor.  Cardio: RRR without murmurs, rubs or gallops. Brisk peripheral pulses without edema.  Chest: symmetric, with normal excursions and percussion.  Breasts:  Declines, defer to GYN Abdomen: Soft, nontender, no guarding, rebound, hernias, masses, or organomegaly.  Lymphatics: Non tender without lymphadenopathy.  Genitourinary: Defer to GYN Musculoskeletal: Full ROM all peripheral extremities,5/5 strength, and normal gait.  Skin: Warm, dry without rashes, lesions, ecchymosis. Neuro: Cranial nerves intact, reflexes equal bilaterally. Normal muscle tone, no cerebellar symptoms. Sensation intact.  Psych: Awake and oriented X 3, normal affect, Insight and Judgment fair.   EKG: WNL baseline in chart, defer  Malissia Rabbani 3:27 PM [facility]

## 2022-06-10 LAB — CBC WITH DIFFERENTIAL/PLATELET
Absolute Monocytes: 429 cells/uL (ref 200–950)
Basophils Absolute: 77 cells/uL (ref 0–200)
Basophils Relative: 1.2 %
Eosinophils Absolute: 550 cells/uL — ABNORMAL HIGH (ref 15–500)
Eosinophils Relative: 8.6 %
HCT: 36 % (ref 35.0–45.0)
Hemoglobin: 12.2 g/dL (ref 11.7–15.5)
Lymphs Abs: 1811 cells/uL (ref 850–3900)
MCH: 32.2 pg (ref 27.0–33.0)
MCHC: 33.9 g/dL (ref 32.0–36.0)
MCV: 95 fL (ref 80.0–100.0)
MPV: 9.2 fL (ref 7.5–12.5)
Monocytes Relative: 6.7 %
Neutro Abs: 3533 cells/uL (ref 1500–7800)
Neutrophils Relative %: 55.2 %
Platelets: 469 10*3/uL — ABNORMAL HIGH (ref 140–400)
RBC: 3.79 10*6/uL — ABNORMAL LOW (ref 3.80–5.10)
RDW: 12.2 % (ref 11.0–15.0)
Total Lymphocyte: 28.3 %
WBC: 6.4 10*3/uL (ref 3.8–10.8)

## 2022-06-10 LAB — MICROALBUMIN / CREATININE URINE RATIO
Creatinine, Urine: 18 mg/dL — ABNORMAL LOW (ref 20–275)
Microalb Creat Ratio: 11 mg/g creat (ref <30)
Microalb, Ur: 0.2 mg/dL

## 2022-06-10 LAB — URINALYSIS, ROUTINE W REFLEX MICROSCOPIC
Bilirubin Urine: NEGATIVE
Glucose, UA: NEGATIVE
Hgb urine dipstick: NEGATIVE
Ketones, ur: NEGATIVE
Leukocytes,Ua: NEGATIVE
Nitrite: NEGATIVE
Protein, ur: NEGATIVE
Specific Gravity, Urine: 1.004 (ref 1.001–1.035)
pH: 6.5 (ref 5.0–8.0)

## 2022-06-10 LAB — IRON,TIBC AND FERRITIN PANEL
%SAT: 18 % (calc) (ref 16–45)
Ferritin: 16 ng/mL (ref 16–232)
Iron: 93 ug/dL (ref 40–190)
TIBC: 507 mcg/dL (calc) — ABNORMAL HIGH (ref 250–450)

## 2022-06-10 LAB — VITAMIN B12: Vitamin B-12: 416 pg/mL (ref 200–1100)

## 2022-06-10 LAB — VITAMIN D 25 HYDROXY (VIT D DEFICIENCY, FRACTURES): Vit D, 25-Hydroxy: 34 ng/mL (ref 30–100)

## 2022-06-10 LAB — COMPLETE METABOLIC PANEL WITH GFR
AG Ratio: 1.5 (calc) (ref 1.0–2.5)
ALT: 17 U/L (ref 6–29)
AST: 21 U/L (ref 10–35)
Albumin: 4.3 g/dL (ref 3.6–5.1)
Alkaline phosphatase (APISO): 43 U/L (ref 31–125)
BUN/Creatinine Ratio: 12 (calc) (ref 6–22)
BUN: 13 mg/dL (ref 7–25)
CO2: 24 mmol/L (ref 20–32)
Calcium: 9.3 mg/dL (ref 8.6–10.2)
Chloride: 105 mmol/L (ref 98–110)
Creat: 1.05 mg/dL — ABNORMAL HIGH (ref 0.50–0.99)
Globulin: 2.9 g/dL (calc) (ref 1.9–3.7)
Glucose, Bld: 96 mg/dL (ref 65–99)
Potassium: 4.9 mmol/L (ref 3.5–5.3)
Sodium: 141 mmol/L (ref 135–146)
Total Bilirubin: 0.4 mg/dL (ref 0.2–1.2)
Total Protein: 7.2 g/dL (ref 6.1–8.1)
eGFR: 66 mL/min/{1.73_m2} (ref >=60)

## 2022-06-10 LAB — HEMOGLOBIN A1C
Hgb A1c MFr Bld: 5.3 % of total Hgb (ref <5.7)
Mean Plasma Glucose: 105 mg/dL
eAG (mmol/L): 5.8 mmol/L

## 2022-06-10 LAB — TSH: TSH: 1.23 mIU/L

## 2022-06-10 LAB — INSULIN, RANDOM: Insulin: 12.1 u[IU]/mL

## 2022-06-10 LAB — LIPID PANEL
Cholesterol: 208 mg/dL — ABNORMAL HIGH (ref <200)
HDL: 61 mg/dL (ref >=50)
LDL Cholesterol (Calc): 122 mg/dL (calc) — ABNORMAL HIGH
Non-HDL Cholesterol (Calc): 147 mg/dL (calc) — ABNORMAL HIGH (ref <130)
Total CHOL/HDL Ratio: 3.4 (calc) (ref <5.0)
Triglycerides: 130 mg/dL (ref <150)

## 2022-06-10 LAB — MAGNESIUM: Magnesium: 2.2 mg/dL (ref 1.5–2.5)

## 2022-06-11 ENCOUNTER — Encounter: Payer: BC Managed Care – PPO | Admitting: Nurse Practitioner

## 2022-07-01 ENCOUNTER — Ambulatory Visit: Payer: BC Managed Care – PPO

## 2022-08-07 ENCOUNTER — Ambulatory Visit: Payer: BC Managed Care – PPO | Admitting: Nurse Practitioner

## 2022-08-07 ENCOUNTER — Encounter: Payer: Self-pay | Admitting: Gastroenterology

## 2022-08-07 ENCOUNTER — Encounter: Payer: Self-pay | Admitting: Nurse Practitioner

## 2022-08-07 ENCOUNTER — Ambulatory Visit: Payer: BC Managed Care – PPO | Admitting: Gastroenterology

## 2022-08-07 VITALS — BP 130/90 | HR 77 | Temp 97.6°F | Ht 61.25 in | Wt 159.6 lb

## 2022-08-07 VITALS — BP 132/74 | HR 83 | Ht 61.0 in | Wt 161.0 lb

## 2022-08-07 DIAGNOSIS — Z860100 Personal history of colon polyps, unspecified: Secondary | ICD-10-CM

## 2022-08-07 DIAGNOSIS — R1013 Epigastric pain: Secondary | ICD-10-CM

## 2022-08-07 DIAGNOSIS — K582 Mixed irritable bowel syndrome: Secondary | ICD-10-CM | POA: Diagnosis not present

## 2022-08-07 DIAGNOSIS — R109 Unspecified abdominal pain: Secondary | ICD-10-CM

## 2022-08-07 DIAGNOSIS — M549 Dorsalgia, unspecified: Secondary | ICD-10-CM | POA: Diagnosis not present

## 2022-08-07 DIAGNOSIS — R194 Change in bowel habit: Secondary | ICD-10-CM

## 2022-08-07 DIAGNOSIS — K2100 Gastro-esophageal reflux disease with esophagitis, without bleeding: Secondary | ICD-10-CM | POA: Diagnosis not present

## 2022-08-07 MED ORDER — LANSOPRAZOLE 15 MG PO TBDD: 15.0000 mg | DELAYED_RELEASE_TABLET | Freq: Every day | ORAL | 3 refills | Status: DC

## 2022-08-07 MED ORDER — AMITRIPTYLINE HCL 10 MG PO TABS: 10.0000 mg | ORAL_TABLET | Freq: Every day | ORAL | 1 refills | Status: DC

## 2022-08-07 NOTE — Patient Instructions (Signed)
Glomerular Filtration Rate Test Why am I having this test? Glomerular filtration rate (GFR) is a measurement of how well your kidneys are working. You may have this test to diagnose or assess the severity (stage) of kidney disease. This test is sometimes also called a calculated, or estimated, glomerular filtration rate (eGFR).  What is being tested? Your GFR is estimated or calculated based on your: Creatinine level in your blood. Creatinine is a waste product from your muscles. Healthy kidneys filter creatinine out of your blood. Age. Gender. Race. Height. Weight. What kind of sample is taken?  A blood sample is required for this test. It is usually collected by inserting a needle into a blood vessel. Tell a health care provider about: Any medical conditions you have. All medicines you are taking, including vitamins, herbs, eye drops, creams, and over-the-counter medicines. Whether you are pregnant or may be pregnant. How are the results reported? Your results will be reported as milliliters of creatinine per minute (mL/min). Your health care provider will compare your results to a normal range that was established after testing a large group of people (reference range). Reference ranges may vary among labs and hospitals. For this test, a common reference range is 90-120 mL/min. What do the results mean? A result within the reference range is considered normal, meaning that your kidneys are filtering creatinine from your blood normally. A GFR below 60 mL/min for 3 months or longer is a sign of long-term (chronic) kidney disease. You may also have other tests to help your health care provider diagnose kidney disease. These tests may include: Urine tests. Removal and examination of a piece of kidney tissue (biopsy). Imaging tests such as an ultrasound, X-ray, or MRI. If you are diagnosed with kidney disease, your GFR can also indicate your stage of kidney disease: GFR of 60-89 mL/min means  stage 2, which is mild kidney disease. GFR of 30-59 mL/min indicates stage 3, which is moderate kidney disease. GFR of 15-29 mL/min indicates stage 4, which is severe kidney disease. GFR of less than 15 mL/min indicates stage 5, which is kidney failure. Talk with your health care provider about what your results mean. Questions to ask your health care provider Ask your health care provider, or the department that is doing the test: When will my results be ready? How will I get my results? What are my treatment options? What other tests do I need? What are my next steps? Summary Glomerular filtration rate (GFR) is a measurement of how well your kidneys are working. You may have this test to diagnose or assess the stage of kidney disease. Your GFR is estimated or calculated based on the creatinine level in your blood and several other factors, including age, gender, race, height, and weight. A GFR below 60 mL/min for 3 months or longer is a sign of chronic kidney disease. You may also have other tests to help your health care provider diagnose kidney disease. This information is not intended to replace advice given to you by your health care provider. Make sure you discuss any questions you have with your health care provider. Document Revised: 06/10/2019 Document Reviewed: 06/10/2019 Elsevier Patient Education  2023 [employer].

## 2022-08-07 NOTE — Progress Notes (Signed)
Assessment and Plan:  Charlotte Wise was seen today for an episodic visit .  Diagnoses and all order for this visit:  1. Left flank pain/Right flank pain Discussed possible kidney infection versus nephrolithiasis.  - CBC with Differential/Platelet - COMPLETE METABOLIC PANEL WITH GFR - Urinalysis, Routine w reflex microscopic - Urine Culture - Microalbumin / creatinine urine ratio  2. Mid back pain Continue to monitor  - CBC with Differential/Platelet - COMPLETE METABOLIC PANEL WITH GFR - Urinalysis, Routine w reflex microscopic - Urine Culture - Microalbumin / creatinine urine ratio   Continue to monitor for any increase in fever, chills, N/V, blood in urine.  Notify office for further evaluation and treatment, questions or concerns if s/s fail to improve. The risks and benefits of my recommendations, as well as other treatment options were discussed with the patient today. Questions were answered.  Further disposition pending results of labs. Discussed med's effects and SE's.    Over 15 minutes of exam, counseling, chart review, and critical decision making was performed.   Future Appointments  Date Time Provider Department Center  08/13/2022  8:20 AM [facility] 2 [facility]  09/17/2022 10:30 AM Yassine Brunsman, Archie Patten, NP [facility] None  11/11/2022  8:20 AM Cirigliano, Vito V, DO [facility]  06/09/2023  3:00 PM Morrison Mcbryar, Archie Patten, NP [facility] None    ------------------------------------------------------------------------------------------------------------------   HPI BP (!) 130/90   Pulse 77   Temp 97.6 F (36.4 C)   Ht 5' 1.25" (1.556 m)   Wt 159 lb 9.6 oz (72.4 kg)   SpO2 95%   BMI 29.91 kg/m    Patient complains of  left right and mid back pain with left being most prominent.     Pain feels achy and is intermittent.  She has had symptoms for 4 weeks. Patient denies fever, headache, vaginal discharge, and nausea, vomiting, abdominal pain . Patient  does not have a history of recurrent UTI. Patient does not have a history of pyelonephritis.  She did have slight elevation in serum creatinine last OV (05/2022), mildly decreased kidney function with GFR 66.  She reports staying well hydrated.    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 Takes 0-6 per day depending on her pain level   aluminum-magnesium hydroxide-simethicone (MAALOX) 200-200-20 MG/5ML SUSP Take 20 mLs by mouth 3 (three) times daily.   AMBULATORY NON FORMULARY MEDICATION 1 Scoop daily. Medication Name: Clear Vite-PSF (k-84) Enzyme based, multivitamin, mineral & herbal dietary supplement powder   AMBULATORY NON FORMULARY MEDICATION 1 tablet at bedtime. Medication Name: Domingo Dimes dietary supplement with proteolytiic enzymes for the gut   AMBULATORY NON FORMULARY MEDICATION 2 tablets daily in the afternoon. Medication Name: Drenamin dietary supplement 3700 for adrenal support   AMBULATORY NON FORMULARY MEDICATION 3 tablets daily in the afternoon. Medication Name: A-F betafood dietary supplement 0825 for gallbladder   amitriptyline (ELAVIL) 10 MG  tablet Take 1 tablet (10 mg total) by mouth at bedtime.   Bismuth Subsalicylate (PEPTO-BISMOL PO) Take by mouth as needed.   Ca Carbonate-Mag Hydroxide (ROLAIDS PO) Take 1 each by mouth as needed.   dicyclomine (BENTYL) 10 MG capsule Take 1 capsule (10 mg total) by mouth 4 (four) times daily  -  before meals and at bedtime.   etodolac (LODINE) 400 MG tablet Take 1 tablet (400 mg total) by mouth 2 (two) times daily.   famotidine (PEPCID) 20 MG tablet Take 1 tablet 2 x /day with meals for Acid Indigestion   gabapentin (NEURONTIN) 400 MG capsule TAKE 2 CAPSULES(800 MG) BY MOUTH FOUR TIMES DAILY AS NEEDED   Glucosamine-Chondroitin 1500-1200 MG/30ML LIQD Take by mouth.   hydrOXYzine (ATARAX) 50 MG tablet Take 1-2 tablets (50-100 mg total) by mouth every 6 (six) hours as needed for anxiety.   Ibuprofen 200 MG CAPS Take by mouth daily.   lactobacillus acidophilus & bulgar (LACTINEX) chewable tablet Chew 1 tablet by mouth 3 (three) times daily with meals.   lansoprazole (PREVACID SOLUTAB) 15 MG disintegrating tablet Take 1 tablet (15 mg total) by mouth daily at 12 noon.   levothyroxine (SYNTHROID) 75 MCG tablet TAKE 1 TABLET DAILY ON EMPTY STOMACH WITH ONLY WATER FOR 30 MINUTES & NO ANTACID MEDS, CALCIUM OR MAGNESIUM FOR 4 HOURS AND AVOID BIOTIN   LO LOESTRIN FE 1 MG-10 MCG / 10 MCG tablet Take 1 tablet by mouth daily.   MAGNESIUM PO Take 400 mg by mouth daily.   methocarbamol (ROBAXIN-750) 750 MG tablet Take 1 tablet (750 mg total) by mouth every 8 (eight) hours as needed for muscle spasms.   methylPREDNISolone (MEDROL) 4 MG tablet Taper from 6 pills po for one day to 1 pill po the last day over 6 days   mirabegron ER (MYRBETRIQ) 25 MG TB24 tablet Take 1 tab daily for 1 week then 2 tabs (50 mg) daily for 1 week for bladder symptoms.   Multiple Vitamins-Minerals (MULTIVITAMIN ADULTS PO) Take 1 tablet by mouth daily.   naproxen sodium (ALEVE) 220 MG tablet Take 220 mg by mouth.   nystatin (MYCOSTATIN) 100000 UNIT/ML suspension Swish 5 mL in the mouth and retain for as long as possible (several minutes) before swallowing.   Omega 3-6-9 Fatty Acids (OMEGA-3-6-9 PO) Take 1 Dose by mouth daily.   OVER THE COUNTER MEDICATION Takes Kratom 8 capsules, 600mg  each   oxyCODONE-acetaminophen  (PERCOCET/ROXICET) 5-325 MG tablet Take 1 tablet by mouth every 4 (four) hours as needed.   promethazine (PHENERGAN) 25 MG tablet TAKE 1 TABLET BY MOUTH EVERY 6 HOURS AS NEEDED FOR NAUSEA OR HEADACHE   psyllium (METAMUCIL) 58.6 % powder Take 1 packet by mouth daily as needed.   sucralfate (CARAFATE) 1 g tablet Take 1 tablet (1 g total) by mouth 4 (four) times daily -  with meals and at bedtime.   temazepam (RESTORIL) 30 MG capsule TAKE 1 CAPSULE BY MOUTH EVERY NIGHT AT BEDTIME AS NEEDED FOR SLEEP   TURMERIC PO Take by mouth.   VITAMIN D PO Take 10,000 Units by mouth daily.   Zinc 50 MG TABS Take 1 tablet Daily   No current facility-administered medications on file prior to visit.    ROS: all negative except what is noted in the HPI.   Physical Exam:  BP (!) 130/90   Pulse 77   Temp 97.6 F (36.4 C)   Ht 5' 1.25" (1.556 m)   Wt 159 lb 9.6  oz (72.4 kg)   SpO2 95%   BMI 29.91 kg/m   General Appearance: NAD.  Awake, conversant and cooperative. Eyes: PERRLA, EOMs intact.  Sclera white.  Conjunctiva without erythema. Sinuses: No frontal/maxillary tenderness.  No nasal discharge. Nares patent.  ENT/Mouth: Ext aud canals clear.  Bilateral TMs w/DOL and without erythema or bulging. Hearing intact.  Posterior pharynx without swelling or exudate.  Tonsils without swelling or erythema.  Neck: Supple.  No masses, nodules or thyromegaly. Respiratory: Effort is regular with non-labored breathing. Breath sounds are equal bilaterally without rales, rhonchi, wheezing or stridor.  Cardio: RRR with no MRGs. Brisk peripheral pulses without edema.  Abdomen: Active BS in all four quadrants.  Soft and non-tender without guarding, rebound tenderness, hernias or masses. Lymphatics: Non tender without lymphadenopathy.  Musculoskeletal: Full ROM, 5/5 strength, normal ambulation.  No clubbing or cyanosis. Skin: Appropriate color for ethnicity. Warm without rashes, lesions, ecchymosis, ulcers.  Neuro: CN  II-XII grossly normal. Normal muscle tone without cerebellar symptoms and intact sensation.   Psych: AO X 3,  appropriate mood and affect, insight and judgment.     Adela Glimpse, NP 2:40 PM [facility]

## 2022-08-07 NOTE — Progress Notes (Signed)
Chief Complaint:    GERD  GI History: 47 year old female with a history of hepatic steatosis, anxiety, schizophreniform disorder, headaches, depression, hyperlipidemia, hypothyroidism, follows in the GI clinic for the following:   1) GERD: History of enamel erosion but no typical reflux symptoms until 02/2020 (heartburn, upper abdominal pain, early satiety) -02/2020: Normal CBC, CMP.  H. pylori breath test negative.  Started on Pepcid -03/2020: Changed Pepcid to omeprazole, discontinued all NSAIDs -04/2020: EGD: 2 cm tongue of salmon-colored mucosa (path negative for Barrett's), Hill grade 1, minimal non-H. pylori gastritis, normal duodenum - 10/22/2020: Changed omeprazole to pantoprazole 40 mg daily and added FD guard for suspected overlapping nonulcer dyspepsia   2) IBS: Was previously predominantly constipation and well controlled with OTC mag supplements, but in 2022 started developing watery, nonbloody diarrhea with associated abdominal pain/cramping.  Now symptoms alternating, but more so diarrhea predominant. - 07/2020: Negative/normal GI PCR panel, calprotectin - 11/09/2020: CT AP: Gas and stool throughout the colon, otherwise normal - 11/21/2020: Normal CBC, CMP.  TSH 5.22 with repeat 3.57 in 12/2020 - 05/03/2021: GI follow-up.  Symptoms improving with Pepto-Bismol, psyllium, OTC digestive enzyme.  Normal pancreatic elastase, CBC, CMP, TSH   Endoscopic History: -EGD (04/2020): 2 cm tongue of salmon-colored mucosa (path negative for Barrett's),; mucosa, Hill grade 1, minimal non-H. pylori gastritis, normal duodenum -Colonoscopy (04/2020): 1 SSP, 1 traditional serrated adenoma, 3 benign polyps.  Sigmoid/cecal diverticulosis, grade 2 internal hemorrhoids.  Repeat in 3 years  HPI:     Patient is a 47 y.o. female presenting to the Gastroenterology Clinic for follow-up.  Last seen by me on 05/03/2021.  At that time, reflux was well-controlled with Protonix 40 mg daily.  IBS was improving and  recommended starting low FODMAP diet, fiber supplement, Bentyl on demand, and recommended pancreatic elastase (normal) and checking with her PCM about restarting her Cymbalta.  Main issue today is her reflux. Wakes with acid taste in mouth. Eats small meals throughout the day, with goal 1200 kcal/day.   No longer taking pantoprazole. Went back to omeprazole, but feels more acid and abdominal discomfort with ingesting the capsule/tablet. Wants a liquid or chewable.  She crushes tablets (even w/ APAP) and opens capsules as she feels abdominal pain if she doesn't do this.  She can feel that same dyspepsia immediately after ingestion of food.  Separately, has a dark tongue that has been there for quite some time. Has d/w her dentist who isn't sure. She plans to request referral to ENT from her PCM at her upcoming appt this afternoon.    Most recent labs reviewed and notable for normal TSH, A1c, vitamin D, B12, iron panel (ferritin 16), with CBC and CMP as below.  No recent abdominal imaging for review.     Latest Ref Rng & Units 06/09/2022    4:20 PM 03/06/2022   12:00 AM 06/06/2021    4:01 PM  CBC  WBC 3.8 - 10.8 Thousand/uL 6.4  5.7  6.9   Hemoglobin 11.7 - 15.5 g/dL 40.9  81.1  91.4   Hematocrit 35.0 - 45.0 % 36.0  35.7  35.6   Platelets 140 - 400 Thousand/uL 469  380  345       Latest Ref Rng & Units 06/09/2022    4:20 PM 03/06/2022   12:00 AM 06/06/2021    4:01 PM  CMP  Glucose 65 - 99 mg/dL 96  90  91   BUN 7 - 25 mg/dL 13  6  8  Creatinine 0.50 - 0.99 mg/dL 1.61  0.96  0.45   Sodium 135 - 146 mmol/L 141  139  133   Potassium 3.5 - 5.3 mmol/L 4.9  4.7  4.6   Chloride 98 - 110 mmol/L 105  105  100   CO2 20 - 32 mmol/L 24  26  25    Calcium 8.6 - 10.2 mg/dL 9.3  9.3  9.2   Total Protein 6.1 - 8.1 g/dL 7.2  6.9  6.9   Total Bilirubin 0.2 - 1.2 mg/dL 0.4  0.6  0.7   AST 10 - 35 U/L 21  16  16    ALT 6 - 29 U/L 17  10  13       Review of systems:     No chest pain, no SOB, no  fevers, no urinary sx   Past Medical History:  Diagnosis Date   Allergy    Anxiety    Arthritis    Diverticulosis    Gastritis    Hyperlipidemia 05/12/2018   Major depressive disorder, 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 Takes 0-6 per day depending on her pain level     aluminum-magnesium hydroxide-simethicone (MAALOX) 200-200-20 MG/5ML SUSP Take 20 mLs by mouth 3 (three) times daily.     AMBULATORY NON FORMULARY MEDICATION 1 Scoop daily. Medication Name: Clear Vite-PSF (k-84) Enzyme based, multivitamin, mineral & herbal dietary supplement powder     AMBULATORY NON FORMULARY MEDICATION 1 tablet at bedtime. Medication Name: Domingo Dimes dietary supplement with proteolytiic enzymes for the gut     AMBULATORY NON FORMULARY MEDICATION 2 tablets daily in the afternoon. Medication Name: Drenamin dietary supplement 3700 for adrenal support     AMBULATORY NON FORMULARY MEDICATION 3 tablets daily in the afternoon. Medication Name: A-F betafood dietary supplement 0825 for gallbladder     Bismuth Subsalicylate (PEPTO-BISMOL PO) Take by mouth as needed.     Ca Carbonate-Mag Hydroxide (ROLAIDS PO) Take 1 each by mouth as needed.     dicyclomine (BENTYL) 10 MG capsule Take 1 capsule (10 mg total) by mouth 4 (four) times daily -  before meals and at bedtime. 120 capsule 2   etodolac (LODINE) 400 MG tablet Take 1 tablet (400 mg total) by mouth 2 (two) times daily. (Patient not taking: Reported on 01/16/2022) 60 tablet 5   famotidine (PEPCID) 20 MG tablet Take 1 tablet 2 x /day with meals for Acid Indigestion  60 tablet 2   gabapentin (NEURONTIN) 400 MG capsule TAKE 2 CAPSULES(800 MG) BY MOUTH FOUR TIMES DAILY AS NEEDED 240 capsule 3   Glucosamine-Chondroitin 1500-1200 MG/30ML LIQD Take by mouth.     hydrOXYzine (ATARAX) 50 MG tablet Take 1-2 tablets (50-100 mg total) by mouth every 6 (six) hours as needed for anxiety. 120 tablet 0   Ibuprofen 200 MG CAPS Take by mouth daily.     lactobacillus acidophilus & bulgar (LACTINEX) chewable tablet Chew 1 tablet by mouth 3 (three) times daily with meals. 90 tablet 1   levothyroxine (SYNTHROID) 75 MCG tablet TAKE 1 TABLET DAILY ON EMPTY STOMACH WITH ONLY WATER FOR 30 MINUTES &  NO ANTACID MEDS, CALCIUM OR MAGNESIUM FOR 4 HOURS AND AVOID BIOTIN 90 tablet 3   LO LOESTRIN FE 1 MG-10 MCG / 10 MCG tablet Take 1 tablet by mouth daily.     MAGNESIUM PO Take 400 mg by mouth daily.     methocarbamol (ROBAXIN-750) 750 MG tablet Take 1 tablet (750 mg total) by mouth every 8 (eight) hours as needed for muscle spasms. 60 tablet 1   methylPREDNISolone (MEDROL) 4 MG tablet Taper from 6 pills po for one day to 1 pill po the last day over 6 days (Patient not taking: Reported on 01/16/2022) 21 tablet 0   mirabegron ER (MYRBETRIQ) 25 MG TB24 tablet Take 1 tab daily for 1 week then 2 tabs (50 mg) daily for 1 week for bladder symptoms. (Patient not taking: Reported on 03/06/2022) 21 tablet 0   Multiple Vitamins-Minerals (MULTIVITAMIN ADULTS PO) Take 1 tablet by mouth daily.     naproxen sodium (ALEVE) 220 MG tablet Take 220 mg by mouth. (Patient not taking: Reported on 03/06/2022)     nystatin (MYCOSTATIN) 100000 UNIT/ML suspension Swish 5 mL in the mouth and retain for as long as possible (several minutes) before swallowing. (Patient not taking: Reported on 05/01/2022) 60 mL 1   Omega 3-6-9 Fatty Acids (OMEGA-3-6-9 PO) Take 1 Dose by mouth daily.     omeprazole (PRILOSEC) 20 MG capsule Take 1 capsule (20 mg total) by mouth 2 (two) times daily before a meal. 60 capsule 2   OVER THE  COUNTER MEDICATION Takes Kratom 8 capsules, 600mg  each (Patient not taking: Reported on 06/09/2022)     promethazine (PHENERGAN) 25 MG tablet TAKE 1 TABLET BY MOUTH EVERY 6 HOURS AS NEEDED FOR NAUSEA OR HEADACHE 30 tablet 0   psyllium (METAMUCIL) 58.6 % powder Take 1 packet by mouth daily as needed.     sucralfate (CARAFATE) 1 g tablet Take 1 tablet (1 g total) by mouth 4 (four) times daily -  with meals and at bedtime. 360 tablet 0   temazepam (RESTORIL) 30 MG capsule TAKE 1 CAPSULE BY MOUTH EVERY NIGHT AT BEDTIME AS NEEDED FOR SLEEP 30 capsule 0   TURMERIC PO Take by mouth.     VITAMIN D PO Take 10,000 Units by mouth daily.     Zinc 50 MG TABS Take 1 tablet Daily  0   No current facility-administered medications for this visit.    Physical Exam:     There were no vitals taken for this visit.  GENERAL:  Pleasant female in NAD PSYCH: : Cooperative, normal affect Musculoskeletal:  Normal muscle tone, normal strength NEURO: Alert and oriented x 3, no focal neurologic deficits   IMPRESSION and PLAN:    1) GERD 2) Waterbrash We had an in-depth conversation today regarding the pathophysiology of reflux.  Somewhat difficult to parse out her symptoms with the potential for overlapping esophageal hypersensitivity and/or functional syndrome.  Plan for the following: - Trial course of Prevacid solutabs 15 mg daily - Referral for Esophageal Manometry and pH/impedance testing.  To be done off PPI and H2 blockers  3) Dyspepsia 4) Change in bowel habits 5) Abdominal cramping 6) IBS mixed type - Check H. pylori stool antigen - SIBO breath testing - Had a long conversation today regarding IBS, functional dyspepsia, etc. along with the potential for overlapping syndromes/disease - Trial course of amitriptyline 10 mg qhs.  If no appreciable improvement after 1-2 weeks, will increase to 25 mg   7) Dark tongue Not  entirely sure the etiology.  Could be related to some of her medications that are  aluminum containing.  She reportedly discussed with her dentist who did not offer any diagnosis or further testing - Agree with referral to ENT as she plans to discuss with her PCM later this afternoon  8) History of colon polyps - Repeat colonoscopy in 04/2023 for ongoing polyp surveillance     I spent 35 minutes of time, including in depth chart review, independent review of results as outlined above, communicating results with the patient directly, face-to-face time with the patient, coordinating care, and ordering studies and medications as appropriate, and documentation.   Verlin Dike Eleri Ruben ,DO, FACG 08/07/2022, 9:09 AM

## 2022-08-07 NOTE — Patient Instructions (Addendum)
You have been scheduled for an esophageal manometry and 24 hour PH Probe test at [facility] on 01/07/23 at 1030 PM. Please arrive 30 minutes prior to your procedure for registration. You will need to go to outpatient registration (1st floor of the hospital) first. Make certain to bring your insurance cards as well as a complete list of medications.  Please remember the following:  1) Do not take any muscle relaxants, xanax (alprazolam) or ativan for 1 day prior to your test as well as the day of the test.  2) Nothing to eat or drink after 12:00 midnight on the night before your test.  3) Hold all diabetic medications/insulin the morning of the test. You may eat and take your medications after the test.  4) For 7 days prior to your test do not take: Dexilant, Prevacid, Nexium, Protonix, Aciphex, Zegerid, Pantoprazole, Prilosec or omeprazole.  5) For 7 days prior to your test, do not take: Reglan, Tagamet, Zantac, Axid or Pepcid.  6) You MAY use an antacid such as Rolaids or Tums up to 12 hours prior to your test.  It will take at least 2 weeks to receive the results of this test from your physician.  ------------------------------------------ ABOUT ESOPHAGEAL MANOMETRY Esophageal manometry (muh-NOM-uh-tree) is a test that gauges how well your esophagus works. Your esophagus is the long, muscular tube that connects your throat to your stomach. Esophageal manometry measures the rhythmic muscle contractions (peristalsis) that occur in your esophagus when you swallow. Esophageal manometry also measures the coordination and force exerted by the muscles of your esophagus.  During esophageal manometry, a thin, flexible tube (catheter) that contains sensors is passed through your nose, down your esophagus and into your stomach. Esophageal manometry can be helpful in diagnosing some mostly uncommon disorders that affect your esophagus.  Why it's done Esophageal manometry is used to evaluate  the movement (motility) of food through the esophagus and into the stomach. The test measures how well the circular bands of muscle (sphincters) at the top and bottom of your esophagus open and close, as well as the pressure, strength and pattern of the wave of esophageal muscle contractions that moves food along.  What you can expect Esophageal manometry is an outpatient procedure done without sedation. Most people tolerate it well. You may be asked to change into a hospital gown before the test starts.  During esophageal manometry  While you are sitting up, a member of your health care team sprays your throat with a numbing medication or puts numbing gel in your nose or both.  A catheter is guided through your nose into your esophagus. The catheter may be sheathed in a water-filled sleeve. It doesn't interfere with your breathing. However, your eyes may water, and you may gag. You may have a slight nosebleed from irritation.  After the catheter is in place, you may be asked to lie on your back on an exam table, or you may be asked to remain seated.  You then swallow small sips of water. As you do, a computer connected to the catheter records the pressure, strength and pattern of your esophageal muscle contractions.  During the test, you'll be asked to breathe slowly and smoothly, remain as still as possible, and swallow only when you're asked to do so.  A member of your health care team may move the catheter down into your stomach while the catheter continues its measurements.  The catheter then is slowly withdrawn. The test usually lasts 20  to 30 minutes.  After esophageal manometry  When your esophageal manometry is complete, you may return to your normal activities  This test typically takes 30-45 minutes to complete. ________________________________________________________________________________  ABOUT 24 HOUR PH PROBE An esophageal pH test measures and records the pH in your esophagus to  determine if you have gastroesophageal reflux disease (GERD). The test can also be done to determine the effectiveness of medications or surgical treatment for GERD. What is esophageal reflux? Esophageal reflux is a condition in which stomach acid refluxes or moves back into the esophagus (the "food pipe" leading from the mouth to the stomach). How does the esophageal pH test work? A thin, small tube with an acid sensing device on the tip is gently passed through your nose, down the esophagus ("food tube"), and positioned about 2 inches above the lower esophageal sphincter. The tube is secured to the side of your face with clear tape. The end of the tube exiting from your nose is attached to a portable recorder that is worn on your belt or over your shoulder. The recorder has several buttons on it that you will press to mark certain events. A nurse will review the monitoring instructions with you. Once the test has begun, what do I need to know and do? Activity: Follow your usual daily routine. Do not reduce or change your activities during the monitoring period. Doing so can make the monitoring results less useful.  Note: do not take a tub bath or shower; the equipment can't get wet.  Eating: Eat your regular meals at the usual times. If you do not eat during the monitoring period, your stomach will not produce acid as usual, and the test results will not be accurate. Eat at least 2 meals a day. Eat foods that tend to increase your symptoms (without making yourself miserable). Avoid snacking. Do not suck on hard candy or lozenges and do not chew gum during the monitoring period.  Lying down: Remain upright throughout the day. Do not lie down until you go to bed (unless napping or lying down during the day is part of your daily routine).  Medications: Continue to follow your doctor's advice regarding medications to avoid during the monitoring period.  Recording symptoms: Press the appropriate button on  your recorder when symptoms occur (as discussed with the nurse).  Recording events: Record the time you start and stop eating and drinking (anything other than plain water). Record the time you lie down (even if just resting) and when you get back up. The nurse will explain this.  Unusual symptoms or side effects. If you think you may be experiencing any unusual symptoms or side effects, call your doctor.  You will return the next day to have the tube removed. The information on the recorder will be downloaded to a computer and the results will be analyzed.  After completion of the study Resume your normal diet and medications. Lozenges or hard candy may help ease any sore throat caused by the tube.   _____________________________________________________________ Your provider has ordered "Diatherix" stool testing for you. You have received a kit from our office today containing all necessary supplies to complete this test. Please carefully read the stool collection instructions provided in the kit before opening the accompanying materials. In addition, be sure to place the label from the top left corner of the laboratory request sheet onto the "puritan opti-swab" tube that is supplied in the kit. This label should include your full name and date  of birth. After completing the test, you should secure the purtian tube into the specimen biohazard bag. The laboratory request information sheet (including date and time of specimen collection) should be placed into the outside pocket of the specimen biohazard bag and returned to the [place] lab with 2 days of collection.    ______________________________________________________   Charlotte Wise have been given a testing kit to check for small intestine bacterial overgrowth (SIBO) which is completed by a company named [employer]. Make sure to return your test in the mail using the return mailing label given to you along with the kit. The test order, your demographic  and insurance information have all already been sent to the company. [facility] will collect an upfront charge of $99.74 for commercial insurance plans and $209.74 is you are paying cash. Make sure to discuss with [facility] PRIOR to having the test to see if they have gotten information from your insurance company as to how much your testing will cost out of pocket, if any. Please contact [facility] at phone number [phone] to get instructions regarding how to perform the test as our office is unable to give specific testing instructions.   We have sent the following medications to your pharmacy for you to pick up at your convenience: Prevacid Solutabs, Amitriptyline _______________________________________________________  If your blood pressure at your visit was 140/90 or greater, please contact your primary care physician to follow up on this.  _______________________________________________________ If you are age 61 or younger, your body mass index should be between 19-25. Your Body mass index is 30.42 kg/m. If this is out of the aformentioned range listed, please consider follow up with your Primary Care Provider.   __________________________________________________________  The [facility] providers would like to encourage you to use [facility] to communicate with providers for non-urgent requests or questions.  Due to long hold times on the telephone, sending your provider a message by [facility] may be a faster and more efficient way to get a response.  Please allow 48 business hours for a response.  Please remember that this is for non-urgent requests.   Due to recent changes in healthcare laws, you may see the results of your imaging and laboratory studies on MyChart before your provider has had a chance to review them.  We understand that in some cases there may be results that are confusing or concerning to you. Not all laboratory results come back in the same time frame and  the provider may be waiting for multiple results in order to interpret others.  Please give [place] 48 hours in order for your provider to thoroughly review all the results before contacting the office for clarification of your results.   Thank you for choosing me and [facility].  Vito Cirigliano, D.O.

## 2022-08-08 ENCOUNTER — Encounter: Payer: Self-pay | Admitting: Nurse Practitioner

## 2022-08-08 ENCOUNTER — Telehealth: Payer: Self-pay | Admitting: Pharmacy Technician

## 2022-08-08 NOTE — Telephone Encounter (Signed)
Patient Advocate Encounter  Received notification from [facility] that prior authorization for LANSOPRAZOLE 15MG  is required.   PA submitted on 5.17.24 Key [id number]  Status is pending

## 2022-08-09 LAB — COMPLETE METABOLIC PANEL WITH GFR
AG Ratio: 1.9 (calc) (ref 1.0–2.5)
ALT: 14 U/L (ref 6–29)
AST: 16 U/L (ref 10–35)
Albumin: 4.6 g/dL (ref 3.6–5.1)
Alkaline phosphatase (APISO): 53 U/L (ref 31–125)
BUN: 8 mg/dL (ref 7–25)
CO2: 26 mmol/L (ref 20–32)
Calcium: 9.6 mg/dL (ref 8.6–10.2)
Chloride: 104 mmol/L (ref 98–110)
Creat: 0.94 mg/dL (ref 0.50–0.99)
Globulin: 2.4 g/dL (calc) (ref 1.9–3.7)
Glucose, Bld: 99 mg/dL (ref 65–99)
Potassium: 4.8 mmol/L (ref 3.5–5.3)
Sodium: 139 mmol/L (ref 135–146)
Total Bilirubin: 0.5 mg/dL (ref 0.2–1.2)
Total Protein: 7 g/dL (ref 6.1–8.1)
eGFR: 75 mL/min/{1.73_m2} (ref >=60)

## 2022-08-09 LAB — URINALYSIS, ROUTINE W REFLEX MICROSCOPIC
Bilirubin Urine: NEGATIVE
Glucose, UA: NEGATIVE
Hgb urine dipstick: NEGATIVE
Ketones, ur: NEGATIVE
Leukocytes,Ua: NEGATIVE
Nitrite: NEGATIVE
Protein, ur: NEGATIVE
Specific Gravity, Urine: 1.004 (ref 1.001–1.035)
pH: 7.5 (ref 5.0–8.0)

## 2022-08-09 LAB — CBC WITH DIFFERENTIAL/PLATELET
Absolute Monocytes: 498 cells/uL (ref 200–950)
Basophils Absolute: 63 cells/uL (ref 0–200)
Basophils Relative: 0.8 %
Eosinophils Absolute: 308 cells/uL (ref 15–500)
Eosinophils Relative: 3.9 %
HCT: 39.2 % (ref 35.0–45.0)
Hemoglobin: 13 g/dL (ref 11.7–15.5)
Lymphs Abs: 2489 cells/uL (ref 850–3900)
MCH: 32 pg (ref 27.0–33.0)
MCHC: 33.2 g/dL (ref 32.0–36.0)
MCV: 96.6 fL (ref 80.0–100.0)
MPV: 9.3 fL (ref 7.5–12.5)
Monocytes Relative: 6.3 %
Neutro Abs: 4543 cells/uL (ref 1500–7800)
Neutrophils Relative %: 57.5 %
Platelets: 407 10*3/uL — ABNORMAL HIGH (ref 140–400)
RBC: 4.06 10*6/uL (ref 3.80–5.10)
RDW: 12.7 % (ref 11.0–15.0)
Total Lymphocyte: 31.5 %
WBC: 7.9 10*3/uL (ref 3.8–10.8)

## 2022-08-09 LAB — URINE CULTURE
MICRO NUMBER:: 14967224
Result:: NO GROWTH
SPECIMEN QUALITY:: ADEQUATE

## 2022-08-09 LAB — MICROALBUMIN / CREATININE URINE RATIO
Creatinine, Urine: 17 mg/dL — ABNORMAL LOW (ref 20–275)
Microalb, Ur: <0.2 mg/dL

## 2022-08-10 ENCOUNTER — Other Ambulatory Visit: Payer: Self-pay | Admitting: Nurse Practitioner

## 2022-08-10 DIAGNOSIS — R109 Unspecified abdominal pain: Secondary | ICD-10-CM

## 2022-08-13 ENCOUNTER — Ambulatory Visit
Admission: RE | Admit: 2022-08-13 | Discharge: 2022-08-13 | Disposition: A | Discharge status: Home / Self Care | Payer: BC Managed Care – PPO | Source: Ambulatory Visit | Attending: Obstetrics and Gynecology | Admitting: Obstetrics and Gynecology

## 2022-08-13 ENCOUNTER — Other Ambulatory Visit (HOSPITAL_COMMUNITY): Payer: Self-pay

## 2022-08-13 DIAGNOSIS — D492 Neoplasm of unspecified behavior of bone, soft tissue, and skin: Secondary | ICD-10-CM | POA: Diagnosis not present

## 2022-08-13 DIAGNOSIS — Z1231 Encounter for screening mammogram for malignant neoplasm of breast: Secondary | ICD-10-CM

## 2022-08-13 DIAGNOSIS — D235 Other benign neoplasm of skin of trunk: Secondary | ICD-10-CM | POA: Diagnosis not present

## 2022-08-13 DIAGNOSIS — L821 Other seborrheic keratosis: Secondary | ICD-10-CM | POA: Diagnosis not present

## 2022-08-13 DIAGNOSIS — L609 Nail disorder, unspecified: Secondary | ICD-10-CM | POA: Diagnosis not present

## 2022-08-13 DIAGNOSIS — L814 Other melanin hyperpigmentation: Secondary | ICD-10-CM | POA: Diagnosis not present

## 2022-08-13 NOTE — Telephone Encounter (Signed)
Additional information requested by [facility] via fax. Provided information and faxed back to number on form [phone].

## 2022-08-15 ENCOUNTER — Telehealth: Payer: Self-pay | Admitting: Gastroenterology

## 2022-08-15 MED ORDER — AMITRIPTYLINE HCL 25 MG PO TABS: 25.0000 mg | ORAL_TABLET | Freq: Every day | ORAL | 3 refills | Status: DC

## 2022-08-15 NOTE — Telephone Encounter (Signed)
New Rx for 25 mg is sent to patient's pharmacy. Patient made aware.

## 2022-08-15 NOTE — Telephone Encounter (Signed)
Pharmacy Patient Advocate Encounter  Received notification from [facility] that the request for prior authorization for LANSOPRAZOLE 15MG  has been denied due to    Please be advised we currently do not have a Pharmacist to review denials, therefore you will need to process appeals accordingly as needed. Thanks for your support at this time.   You may call [phone] ext [phone] or fax [phone], to appeal.

## 2022-08-15 NOTE — Telephone Encounter (Signed)
Inbound call from patient f/u on PA 

## 2022-08-15 NOTE — Telephone Encounter (Signed)
Inbound call from patient inquiring if she can have a higher dose of the Elavil medication rather than the 10 mg. States medication is helping her.  Please advise.

## 2022-08-19 ENCOUNTER — Telehealth: Payer: Self-pay | Admitting: Neurology

## 2022-08-19 ENCOUNTER — Ambulatory Visit
Admission: RE | Admit: 2022-08-19 | Discharge: 2022-08-19 | Disposition: A | Discharge status: Home / Self Care | Payer: BC Managed Care – PPO | Source: Ambulatory Visit | Attending: Nurse Practitioner | Admitting: Nurse Practitioner

## 2022-08-19 DIAGNOSIS — R109 Unspecified abdominal pain: Secondary | ICD-10-CM

## 2022-08-19 NOTE — Telephone Encounter (Signed)
Pt called needing to speak to the Rn regarding past medications provider put her on in the past. Please advise.

## 2022-08-19 NOTE — Telephone Encounter (Signed)
Called pt back. Referred to pain management. She wanted to know her tried/failed meds in the past. Advised she previously tried: amitriptyline, imipramine, gabapentin, duloxetine. She wanted to relay to pain management so they did not duplicate therapies she has already tried in the past. Nothing further needed.

## 2022-08-21 DIAGNOSIS — Z79891 Long term (current) use of opiate analgesic: Secondary | ICD-10-CM | POA: Diagnosis not present

## 2022-08-21 DIAGNOSIS — M5412 Radiculopathy, cervical region: Secondary | ICD-10-CM | POA: Diagnosis not present

## 2022-08-22 NOTE — Telephone Encounter (Signed)
Called [facility] at [phone] ext [phone]. Peer to peer appointment is scheduled with Dr. Barron Alvine on 08/27/22 at 12:30 PM. Sent a reminder message to Dr. Barron Alvine and myself.

## 2022-08-25 DIAGNOSIS — M545 Low back pain, unspecified: Secondary | ICD-10-CM | POA: Diagnosis not present

## 2022-08-25 DIAGNOSIS — M5013 Cervical disc disorder with radiculopathy, cervicothoracic region: Secondary | ICD-10-CM | POA: Diagnosis not present

## 2022-08-27 NOTE — Telephone Encounter (Signed)
Cirigliano, Verlin Dike, DO  Zamara Cozad, [employer], CMA Thanks. Call all finished and medication approved.  Pharmacy is notified. They would call the patient to pick up the medication when it is ready.

## 2022-09-15 ENCOUNTER — Ambulatory Visit
Admission: RE | Admit: 2022-09-15 | Discharge: 2022-09-15 | Disposition: A | Discharge status: Home / Self Care | Payer: BC Managed Care – PPO | Source: Ambulatory Visit | Attending: Nurse Practitioner | Admitting: Nurse Practitioner

## 2022-09-15 ENCOUNTER — Encounter: Payer: Self-pay | Admitting: Nurse Practitioner

## 2022-09-15 ENCOUNTER — Ambulatory Visit: Payer: BC Managed Care – PPO | Admitting: Nurse Practitioner

## 2022-09-15 VITALS — BP 130/74 | HR 76 | Temp 98.5°F | Ht 61.25 in | Wt 163.8 lb

## 2022-09-15 DIAGNOSIS — Z8719 Personal history of other diseases of the digestive system: Secondary | ICD-10-CM

## 2022-09-15 DIAGNOSIS — D649 Anemia, unspecified: Secondary | ICD-10-CM | POA: Diagnosis not present

## 2022-09-15 DIAGNOSIS — E782 Mixed hyperlipidemia: Secondary | ICD-10-CM

## 2022-09-15 DIAGNOSIS — E038 Other specified hypothyroidism: Secondary | ICD-10-CM

## 2022-09-15 DIAGNOSIS — E559 Vitamin D deficiency, unspecified: Secondary | ICD-10-CM

## 2022-09-15 DIAGNOSIS — K5903 Drug induced constipation: Secondary | ICD-10-CM

## 2022-09-15 DIAGNOSIS — Z79899 Other long term (current) drug therapy: Secondary | ICD-10-CM

## 2022-09-15 DIAGNOSIS — K458 Other specified abdominal hernia without obstruction or gangrene: Secondary | ICD-10-CM

## 2022-09-15 DIAGNOSIS — R1011 Right upper quadrant pain: Secondary | ICD-10-CM | POA: Diagnosis not present

## 2022-09-15 DIAGNOSIS — D509 Iron deficiency anemia, unspecified: Secondary | ICD-10-CM | POA: Diagnosis not present

## 2022-09-15 DIAGNOSIS — E063 Autoimmune thyroiditis: Secondary | ICD-10-CM

## 2022-09-15 DIAGNOSIS — M5412 Radiculopathy, cervical region: Secondary | ICD-10-CM | POA: Diagnosis not present

## 2022-09-15 NOTE — Progress Notes (Unsigned)
Assessment and Plan:  Charlotte Wise was seen today for an episodic visit.  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 Obtain abd x-ray to assess colon for stool burden.   Continue magnesium. Stay well hydrated Remain active  - DG Abd 2 Views; Future - CBC with Differential/Platelet    History of fatty infiltration of liver Monitor LFTs.  - COMPLETE METABOLIC PANEL WITH GFR  Vitamin D deficiency Continue supplement for goal of 60-100 Monitor Vitamin D levels  Hyperlipidemia Discussed lifestyle modifications. Recommended diet heavy in fruits and veggies, omega 3's. Decrease consumption of animal meats, cheeses, and dairy products. Remain active and exercise as tolerated. Continue to monitor. Check lipids/TSH  Hypothyroidism due to Hashimoto's thyroiditis Controlled. Continue Levothyroxine. Reminded to take on an empty stomach 30-29mins before food.  Stop any Biotin Supplement 48-72 hours before next TSH level to reduce the risk of falsely low TSH levels. Continue to monitor.    - TSH  Medication management All medications discussed and reviewed in full. All questions and concerns regarding medications addressed.    - CBC with Differential/Platelet - COMPLETE METABOLIC PANEL WITH GFR - Lipid panel - TSH - VITAMIN D 25 Hydroxy (Vit-D Deficiency, Fractures)  Abdominal hernia Palpated during exam -continue to monitor Discussed increase in sizes, discoloration, fever, chills, N/v.  Notify office for further evaluation and treatment, questions or concerns if any reported s/s fail to improve.   The patient was advised to call back or seek an in-person evaluation if any symptoms worsen or if the condition fails to improve as anticipated.   Further disposition pending results of labs. Discussed med's effects and SE's.    I  discussed the assessment and treatment plan with the patient. The patient was provided an opportunity to ask questions and all were answered. The patient agreed with the plan and demonstrated an understanding of the instructions.  Discussed med's effects and SE's. Screening labs and tests as requested with regular follow-up as recommended.  I provided 25 minutes of face-to-face time during this encounter including counseling, chart review, and critical decision making was preformed.  Today's Plan of Care is based on a patient-centered health care approach known as shared decision making - the decisions, tests and treatments allow for patient preferences and values to be balanced with clinical evidence.     Future Appointments  Date Time Provider Department Center  09/17/2022 10:30 AM Adela Glimpse, NP [facility] None  11/11/2022  8:20 AM Cirigliano, Vito V, DO [facility]  06/09/2023  3:00 PM Latrina Guttman, Archie Patten, NP [facility] None    ------------------------------------------------------------------------------------------------------------------   HPI BP 130/74   Pulse 76   Temp 98.5 F (36.9 C)   Ht 5' 1.25" (1.556 m)   Wt 163 lb 12.8 oz (74.3 kg)   SpO2 99%   BMI 30.70 kg/m   47 y.o.female presents for general follow up with complaints of RUQ pain.    States x3 - 4 days ago she noticed increase in RUQ pain when leaning over the counter.  Pain is intermittent.  Painful to touch.  She has recently establisehd with pain management x2 months ago.  Started with Oxycodone then changed to Buprenorphine patches.  States x2 bowel movements, diarrhea.  Takes magnesium daily.   Reports a hx of FLD without recent elevation in LFTs.  She follows with [facility], Dr. Barron Alvine, last seen 08/07/22.  Most recent EGD  and Colonoscopy 2022. Negative for Barrett's, sigmoid cecal diverticulosis and hemorrhoids.  BMI is Body mass index is 30.7 kg/m., she has not been working on diet and  exercise. Wt Readings from Last 3 Encounters:  09/15/22 163 lb 12.8 oz (74.3 kg)  08/07/22 159 lb 9.6 oz (72.4 kg)  08/07/22 161 lb (73 kg)   Her BP has been controlled at home.  Today their BP is 130/74.  She denies CP, SOB, dizziness.  BP Readings from Last 3 Encounters:  09/15/22 130/74  08/07/22 (!) 130/90  08/07/22 132/74   She is on thyroid medication.  Her medication was not changed last visit.  Takes 75 mcg daily. 1/2 tab on Mon/Thurs.   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 Takes 0-6 per day depending on her pain level   aluminum-magnesium hydroxide-simethicone (MAALOX) 200-200-20 MG/5ML SUSP Take 20 mLs by mouth 3 (three) times daily.   AMBULATORY NON FORMULARY MEDICATION 1 Scoop daily. Medication Name: Clear Vite-PSF (k-84) Enzyme based, multivitamin, mineral & herbal dietary supplement powder   AMBULATORY NON FORMULARY MEDICATION 1 tablet at bedtime. Medication Name: Domingo Dimes dietary supplement with proteolytiic enzymes for the gut   AMBULATORY NON FORMULARY MEDICATION 2 tablets daily in the afternoon. Medication Name: Drenamin dietary supplement 3700 for adrenal support   AMBULATORY NON FORMULARY MEDICATION 3 tablets daily in the afternoon. Medication Name: A-F betafood dietary supplement 0825 for gallbladder   amitriptyline (ELAVIL) 25 MG tablet Take 1 tablet (25 mg total) by mouth at bedtime.   Bismuth Subsalicylate  (PEPTO-BISMOL PO) Take by mouth as needed.   Ca Carbonate-Mag Hydroxide (ROLAIDS PO) Take 1 each by mouth as needed.   dicyclomine (BENTYL) 10 MG capsule Take 1 capsule (10 mg total) by mouth 4 (four) times daily -  before meals and at bedtime.   etodolac (LODINE) 400 MG tablet Take 1 tablet (400 mg total) by mouth 2 (two) times daily.   famotidine (PEPCID) 20 MG tablet Take 1 tablet 2 x /day with meals for Acid Indigestion   gabapentin (NEURONTIN) 400 MG capsule TAKE 2 CAPSULES(800 MG) BY MOUTH FOUR TIMES DAILY AS NEEDED   Glucosamine-Chondroitin 1500-1200 MG/30ML LIQD Take by mouth.   hydrOXYzine (ATARAX) 50 MG tablet Take 1-2 tablets (50-100 mg total) by mouth every 6 (six) hours as needed for anxiety.   Ibuprofen 200 MG CAPS Take by mouth daily.   lactobacillus acidophilus & bulgar (LACTINEX) chewable tablet Chew 1 tablet by mouth 3 (three) times daily with meals.   lansoprazole (PREVACID SOLUTAB) 15 MG disintegrating tablet Take 1 tablet (15 mg total) by mouth daily at 12 noon.   levothyroxine (SYNTHROID) 75 MCG tablet TAKE 1 TABLET DAILY ON EMPTY STOMACH WITH ONLY WATER FOR 30 MINUTES & NO ANTACID MEDS, CALCIUM OR MAGNESIUM FOR 4 HOURS AND AVOID BIOTIN   LO LOESTRIN FE 1 MG-10 MCG / 10 MCG tablet Take  1 tablet by mouth daily.   MAGNESIUM PO Take 400 mg by mouth daily.   methocarbamol (ROBAXIN-750) 750 MG tablet Take 1 tablet (750 mg total) by mouth every 8 (eight) hours as needed for muscle spasms.   methylPREDNISolone (MEDROL) 4 MG tablet Taper from 6 pills po for one day to 1 pill po the last day over 6 days   mirabegron ER (MYRBETRIQ) 25 MG TB24 tablet Take 1 tab daily for 1 week then 2 tabs (50 mg) daily for 1 week for bladder symptoms.   Multiple Vitamins-Minerals (MULTIVITAMIN ADULTS PO) Take 1 tablet by mouth daily.   naproxen sodium (ALEVE) 220 MG tablet Take 220 mg by mouth.   nystatin (MYCOSTATIN) 100000 UNIT/ML suspension Swish 5 mL in the mouth and retain for as long as possible  (several minutes) before swallowing.   Omega 3-6-9 Fatty Acids (OMEGA-3-6-9 PO) Take 1 Dose by mouth daily.   OVER THE COUNTER MEDICATION Takes Kratom 8 capsules, 600mg  each   oxyCODONE-acetaminophen (PERCOCET/ROXICET) 5-325 MG tablet Take 1 tablet by mouth every 4 (four) hours as needed.   promethazine (PHENERGAN) 25 MG tablet TAKE 1 TABLET BY MOUTH EVERY 6 HOURS AS NEEDED FOR NAUSEA OR HEADACHE   psyllium (METAMUCIL) 58.6 % powder Take 1 packet by mouth daily as needed.   sucralfate (CARAFATE) 1 g tablet Take 1 tablet (1 g total) by mouth 4 (four) times daily -  with meals and at bedtime.   temazepam (RESTORIL) 30 MG capsule TAKE 1 CAPSULE BY MOUTH EVERY NIGHT AT BEDTIME AS NEEDED FOR SLEEP   TURMERIC PO Take by mouth.   VITAMIN D PO Take 10,000 Units by mouth daily.   Zinc 50 MG TABS Take 1 tablet Daily   No current facility-administered medications on file prior to visit.    ROS: all negative except what is noted in the HPI.   Physical Exam:  BP 130/74   Pulse 76   Temp 98.5 F (36.9 C)   Ht 5' 1.25" (1.556 m)   Wt 163 lb 12.8 oz (74.3 kg)   SpO2 99%   BMI 30.70 kg/m   General Appearance: NAD.  Awake, conversant and cooperative. Eyes: PERRLA, EOMs intact.  Sclera white.  Conjunctiva without erythema. Sinuses: No frontal/maxillary tenderness.  No nasal discharge. Nares patent.  ENT/Mouth: Ext aud canals clear.  Bilateral TMs w/DOL and without erythema or bulging. Hearing intact.  Posterior pharynx without swelling or exudate.  Tonsils without swelling or erythema.  Neck: Supple.  No masses, nodules or thyromegaly. Respiratory: Effort is regular with non-labored breathing. Breath sounds are equal bilaterally without rales, rhonchi, wheezing or stridor.  Cardio: RRR with no MRGs. Brisk peripheral pulses without edema.  Abdomen: Midline and 2 finger widths right of umbilicus with palpable moveable firm nodule, tender to palpation.  Approximately 2 cm in diameter. Active BS in all  four quadrants.  Soft and without guarding, rebound tenderness. Lymphatics: Non tender without lymphadenopathy.  Musculoskeletal: Full ROM, 5/5 strength, normal ambulation.  No clubbing or cyanosis. Skin: Appropriate color for ethnicity. Warm without rashes, lesions, ecchymosis, ulcers.  Neuro: CN II-XII grossly normal. Normal muscle tone without cerebellar symptoms and intact sensation.   Psych: AO X 3,  appropriate mood and affect, insight and judgment.     Adela Glimpse, NP 1:52 PM [facility]

## 2022-09-16 ENCOUNTER — Other Ambulatory Visit: Payer: Self-pay | Admitting: Nurse Practitioner

## 2022-09-16 DIAGNOSIS — M542 Cervicalgia: Secondary | ICD-10-CM | POA: Diagnosis not present

## 2022-09-16 DIAGNOSIS — D649 Anemia, unspecified: Secondary | ICD-10-CM

## 2022-09-16 DIAGNOSIS — D509 Iron deficiency anemia, unspecified: Secondary | ICD-10-CM

## 2022-09-16 NOTE — Patient Instructions (Signed)
Hernia, Adult     A hernia happens when an organ or tissue inside your body pushes out through a weak spot in the muscles of your belly (abdomen). This makes a bulge. The bulge may be: In a scar from a surgery that was done in your belly (incisional hernia). Near your belly button (umbilical hernia). In your groin (inguinal hernia). Your groin is the area where your leg meets your lower belly. If you are a female, this type could also be in your scrotum. In your upper thigh (femoral hernia). Inside your belly (hiatal hernia). This happens when your stomach slides above the muscle between your belly and your chest (diaphragm). What are the causes? This condition may be caused by: Lifting heavy things. Coughing over a long period of time. Having trouble pooping (constipation). Trouble pooping can lead to straining. A cut from surgery in your belly. A physical problem that is present at birth. Being very overweight. Smoking. Too much fluid in your belly. A testicle that has not moved down into the scrotum, in males. What are the signs or symptoms? The main symptom is a bulge in the area of the hernia, but a bulge may not always be seen. It may grow bigger or be easier to see when you cough or strain (such as when lifting something heavy). A hernia that can be pushed back into the belly rarely causes pain. A hernia that cannot be pushed back into the belly may lose its blood supply. This may cause: Pain. Fever. A feeling like you may vomit, and vomiting. Swelling. Trouble pooping. How is this treated? A hernia that is small and painless may not need to be treated. A hernia that is large or painful may be treated with surgery. Surgery to treat a hernia involves pushing the bulge back into place and repairing the weak area of the muscle or belly. Follow these instructions at home: Activity Avoid straining the muscles near your hernia. This can happen when you: Lift something heavy. Poop  (have a bowel movement). Do not lift anything that is heavier than 10 lb (4.5 kg), or the limit that you are told. When you lift something heavy, use your leg muscles. Do not use your back muscles to lift. Prevent trouble pooping If told by your doctor, take steps to prevent trouble pooping. You may need to: Drink enough fluid to keep your pee (urine) pale yellow. Take medicines. You will be told what medicines to take. Eat foods that are high in fiber. These include beans, whole grains, and fresh fruits and vegetables. Limit foods that are high in fat and sugar. These include fried or sweet foods. General instructions When you cough, try to cough gently. You may try to push your hernia back in by gently pressing on it when you are lying down. Do not try to force the bulge back in if it will not go in easily. If you are overweight, work with your doctor to lose weight safely. Do not smoke or use any products that contain nicotine or tobacco. If you need help quitting, ask your doctor. If you will be having surgery, watch your hernia for changes in shape, size, or color. Tell your doctor if you see any changes. Take over-the-counter and prescription medicines only as told by your doctor. Keep all follow-up visits. Contact a doctor if: You get new pain, swelling, or redness near your hernia. You poop fewer times in a week than normal. You have trouble pooping. You have   poop that is more dry than normal. You have poop that is harder or larger than normal. Get help right away if: You have a fever or chills. You have belly pain that gets worse. You feel like you may vomit, or you vomit. Your hernia cannot be pushed in by gently pressing on it when you are lying down. Your hernia: Changes in shape or size. Changes color. Feels hard, or it hurts when you touch it. These symptoms may be an emergency. Get help right away. Call your local emergency services (911 in the [place] Do not wait to  see if the symptoms will go away. Do not drive yourself to the hospital. Summary A hernia happens when an organ or tissue inside your body pushes out through a weak spot in the belly muscles. This creates a bulge. If your hernia is small and it does not hurt, you may not need treatment. If your hernia is large or it hurts, you may need surgery. If you will be having surgery, watch your hernia for changes in shape, size, or color. Tell your doctor about any changes. This information is not intended to replace advice given to you by your health care provider. Make sure you discuss any questions you have with your health care provider. Document Revised: 10/17/2019 Document Reviewed: 10/17/2019 Elsevier Patient Education  2024 [employer]  

## 2022-09-17 ENCOUNTER — Ambulatory Visit: Payer: BC Managed Care – PPO | Admitting: Nurse Practitioner

## 2022-09-17 ENCOUNTER — Encounter: Payer: Self-pay | Admitting: Nurse Practitioner

## 2022-09-18 DIAGNOSIS — K582 Mixed irritable bowel syndrome: Secondary | ICD-10-CM | POA: Diagnosis not present

## 2022-09-18 DIAGNOSIS — R1013 Epigastric pain: Secondary | ICD-10-CM | POA: Diagnosis not present

## 2022-09-18 DIAGNOSIS — K2100 Gastro-esophageal reflux disease with esophagitis, without bleeding: Secondary | ICD-10-CM | POA: Diagnosis not present

## 2022-09-18 LAB — CBC WITH DIFFERENTIAL/PLATELET
Absolute Monocytes: 590 cells/uL (ref 200–950)
Basophils Absolute: 30 cells/uL (ref 0–200)
Basophils Relative: 0.5 %
Eosinophils Absolute: 242 cells/uL (ref 15–500)
Eosinophils Relative: 4.1 %
HCT: 34.7 % — ABNORMAL LOW (ref 35.0–45.0)
Hemoglobin: 11.6 g/dL — ABNORMAL LOW (ref 11.7–15.5)
Lymphs Abs: 1853 cells/uL (ref 850–3900)
MCH: 31.9 pg (ref 27.0–33.0)
MCHC: 33.4 g/dL (ref 32.0–36.0)
MCV: 95.3 fL (ref 80.0–100.0)
MPV: 9.2 fL (ref 7.5–12.5)
Monocytes Relative: 10 %
Neutro Abs: 3186 cells/uL (ref 1500–7800)
Neutrophils Relative %: 54 %
Platelets: 395 10*3/uL (ref 140–400)
RBC: 3.64 10*6/uL — ABNORMAL LOW (ref 3.80–5.10)
RDW: 12.5 % (ref 11.0–15.0)
Total Lymphocyte: 31.4 %
WBC: 5.9 10*3/uL (ref 3.8–10.8)

## 2022-09-18 LAB — COMPLETE METABOLIC PANEL WITH GFR
AG Ratio: 2 (calc) (ref 1.0–2.5)
ALT: 19 U/L (ref 6–29)
AST: 21 U/L (ref 10–35)
Albumin: 4.5 g/dL (ref 3.6–5.1)
Alkaline phosphatase (APISO): 52 U/L (ref 31–125)
BUN: 8 mg/dL (ref 7–25)
CO2: 26 mmol/L (ref 20–32)
Calcium: 9.3 mg/dL (ref 8.6–10.2)
Chloride: 103 mmol/L (ref 98–110)
Creat: 0.94 mg/dL (ref 0.50–0.99)
Globulin: 2.3 g/dL (calc) (ref 1.9–3.7)
Glucose, Bld: 95 mg/dL (ref 65–99)
Potassium: 4.9 mmol/L (ref 3.5–5.3)
Sodium: 138 mmol/L (ref 135–146)
Total Bilirubin: 0.4 mg/dL (ref 0.2–1.2)
Total Protein: 6.8 g/dL (ref 6.1–8.1)
eGFR: 75 mL/min/{1.73_m2} (ref >=60)

## 2022-09-18 LAB — LIPID PANEL
Cholesterol: 175 mg/dL (ref <200)
HDL: 65 mg/dL (ref >=50)
LDL Cholesterol (Calc): 89 mg/dL (calc)
Non-HDL Cholesterol (Calc): 110 mg/dL (calc) (ref <130)
Total CHOL/HDL Ratio: 2.7 (calc) (ref <5.0)
Triglycerides: 118 mg/dL (ref <150)

## 2022-09-18 LAB — TSH: TSH: 1.99 mIU/L

## 2022-09-18 LAB — VITAMIN D 25 HYDROXY (VIT D DEFICIENCY, FRACTURES): Vit D, 25-Hydroxy: 40 ng/mL (ref 30–100)

## 2022-09-18 LAB — IRON,TIBC AND FERRITIN PANEL
%SAT: 15 % (calc) — ABNORMAL LOW (ref 16–45)
Ferritin: 23 ng/mL (ref 16–232)
Iron: 63 ug/dL (ref 40–190)
TIBC: 428 mcg/dL (calc) (ref 250–450)

## 2022-09-23 ENCOUNTER — Telehealth: Payer: Self-pay

## 2022-09-23 NOTE — Telephone Encounter (Signed)
LVM to the patient to let her know that her H. pylori stool testing was negative. Sent MyChart message as well.

## 2022-09-23 NOTE — Telephone Encounter (Signed)
-----   Message from Dennis V, DO sent at 09/22/2022  5:17 PM EDT ----- Please contact this patient to let her know that her H. pylori stool testing was negative.

## 2022-10-01 ENCOUNTER — Encounter: Payer: Self-pay | Admitting: Gastroenterology

## 2022-10-13 DIAGNOSIS — M5412 Radiculopathy, cervical region: Secondary | ICD-10-CM | POA: Diagnosis not present

## 2022-10-16 DIAGNOSIS — Z01419 Encounter for gynecological examination (general) (routine) without abnormal findings: Secondary | ICD-10-CM | POA: Diagnosis not present

## 2022-10-28 ENCOUNTER — Ambulatory Visit (INDEPENDENT_AMBULATORY_CARE_PROVIDER_SITE_OTHER): Payer: BC Managed Care – PPO | Admitting: Nurse Practitioner

## 2022-10-28 ENCOUNTER — Encounter: Payer: Self-pay | Admitting: Nurse Practitioner

## 2022-10-28 VITALS — BP 118/76 | HR 91 | Temp 97.9°F | Ht 61.25 in | Wt 159.2 lb

## 2022-10-28 DIAGNOSIS — K582 Mixed irritable bowel syndrome: Secondary | ICD-10-CM

## 2022-10-28 DIAGNOSIS — E663 Overweight: Secondary | ICD-10-CM

## 2022-10-28 DIAGNOSIS — K591 Functional diarrhea: Secondary | ICD-10-CM | POA: Diagnosis not present

## 2022-10-28 DIAGNOSIS — Z8719 Personal history of other diseases of the digestive system: Secondary | ICD-10-CM | POA: Diagnosis not present

## 2022-10-28 DIAGNOSIS — R1011 Right upper quadrant pain: Secondary | ICD-10-CM

## 2022-10-28 MED ORDER — PHENTERMINE HCL 37.5 MG PO TABS
ORAL_TABLET | ORAL | 0 refills | Status: DC
Start: 2022-10-28 — End: 2022-11-12

## 2022-10-28 NOTE — Progress Notes (Signed)
Assessment and Plan:  CADE WALDROOP was seen today for an episodic visit.  Diagnoses and all order for this visit:  RUQ pain/drug inducted constipation with IBS-M. Discussed constipation versus functional diarrhea possible IBS give hx of yeast infection. Will obtain gallbladder ultrasound to assess for any underlying etiology/cholecystitis. Mildly positive McBurney's sign. Starty Bentyl as directed.  Stay well hydrated Remain active  Functional diarrhea/IBS-M Continue pro-biotic Stay well hydrated. Continue f/u with GI  History of fatty infiltration of liver Monitor LFTs.  Overweight Start Phentermine as directed. Discussed appropriate BMI Diet modification. Physical activity. Encouraged/praised to build confidence.  Medication management All medications discussed and reviewed in full. All questions and concerns regarding medications addressed.    Orders Placed This Encounter  Procedures   US Abdomen Limited RUQ (LIVER/GB)    INSURANCE: BCBS EPIC ORDER WEIGHT:160 NO SPECIAL NEEDS (WHEELCHAIR/WALKER /OXYGEN) NO GLUCOSE MONITOR / SPINAL CORD STIMULATOR/ INJECTORS  PT AWARE OF $75 NO SHOW FEE PT AWARE TO BRING PHOTO ID & INS CARD    Standing Status:   Future    Standing Expiration Date:   10/28/2023    Order Specific Question:   Reason for Exam (SYMPTOM  OR DIAGNOSIS REQUIRED)    Answer:   RUQ pain, diarrhea    Order Specific Question:   Preferred imaging location?    Answer:   [facility]   Notify office for further evaluation and treatment, questions or concerns if any reported s/s fail to improve.   The patient was advised to call back or seek an in-person evaluation if any symptoms worsen or if the condition fails to improve as anticipated.   Further disposition pending results of labs. Discussed med's effects and SE's.    I discussed the assessment and treatment plan with the patient. The patient was provided an opportunity to ask questions and all were  answered. The patient agreed with the plan and demonstrated an understanding of the instructions.  Discussed med's effects and SE's. Screening labs and tests as requested with regular follow-up as recommended.  I provided 25 minutes of face-to-face time during this encounter including counseling, chart review, and critical decision making was preformed.  Today's Plan of Care is based on a patient-centered health care approach known as shared decision making - the decisions, tests and treatments allow for patient preferences and values to be balanced with clinical evidence.     Future Appointments  Date Time Provider Department Center  11/07/2022  8:00 AM [facility] [place] [facility]  11/11/2022  8:20 AM Cirigliano, Vito V, DO [facility]  12/25/2022  9:30 AM Kameren Pargas, Archie Patten, NP [facility] None  06/09/2023  3:00 PM Anzley Dibbern, Archie Patten, NP [facility] None    ------------------------------------------------------------------------------------------------------------------   HPI BP 118/76   Pulse 91   Temp 97.9 F (36.6 C)   Ht 5' 1.25" (1.556 m)   Wt 159 lb 3.2 oz (72.2 kg)   SpO2 99%   BMI 29.84 kg/m   47 y.o.female presents for general follow up with complaints of RUQ pain.  Pain felt to be ongoing since starting narcotic pain medication approximately 6 months ago.  Abd x-ray 09/15/22 revealed constipation and she was instructed to start Miralax and continue a stool softener.  She presents today with continued ongoing RUQ pain now with diarrhea.  She has had follow up apts scheduled with GI for further work up of intestinal discomfort with possible gastritis versus yeast.  However, she has pushed the apts back but now  plans to move forward.  She does endorse nausea and food aversion.  Feels as though she is continuing to gain weight despite that she does not eat.  She has associated bloating.   She denies fever, chills.    During her last OV 10/28/22 she had also mentined not only  starting tmt with Oxycodone but also changed to Buprenorphine patches.  States x2 bowel movements, diarrhea.  She does continue to takes magnesium daily.   Reports a hx of FLD without recent elevation in LFTs.  She follows with [facility], Dr. Barron Alvine, last seen 08/07/22.  Most recent EGD and Colonoscopy 2022. Negative for Barrett's, sigmoid cecal diverticulosis and hemorrhoids.  BMI is Body mass index is 29.84 kg/m., she has not been working on diet and exercise. Wt Readings from Last 3 Encounters:  10/28/22 159 lb 3.2 oz (72.2 kg)  09/15/22 163 lb 12.8 oz (74.3 kg)  08/07/22 159 lb 9.6 oz (72.4 kg)   Her BP has been controlled at home.  Today their BP is 130/74.  She denies CP, SOB, dizziness.  BP Readings from Last 3 Encounters:  10/28/22 118/76  09/15/22 130/74  08/07/22 (!) 130/90   She is on thyroid medication.  Her medication was not changed last visit.  Takes 75 mcg daily. 1/2 tab on Mon/Thurs.   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 Takes 0-6 per day depending on her pain level   aluminum-magnesium hydroxide-simethicone (MAALOX) 200-200-20 MG/5ML SUSP Take 20 mLs by mouth 3 (three) times daily.   AMBULATORY NON FORMULARY MEDICATION 1 Scoop daily. Medication Name: Clear Vite-PSF (k-84) Enzyme based, multivitamin, mineral & herbal dietary supplement powder   AMBULATORY NON FORMULARY MEDICATION 1 tablet at bedtime. Medication  Name: Domingo Dimes dietary supplement with proteolytiic enzymes for the gut   AMBULATORY NON FORMULARY MEDICATION 2 tablets daily in the afternoon. Medication Name: Drenamin dietary supplement 3700 for adrenal support   AMBULATORY NON FORMULARY MEDICATION 3 tablets daily in the afternoon. Medication Name: A-F betafood dietary supplement 0825 for gallbladder   amitriptyline (ELAVIL) 25 MG tablet Take 1 tablet (25 mg total) by mouth at bedtime.   Bismuth Subsalicylate (PEPTO-BISMOL PO) Take by mouth as needed.   Ca Carbonate-Mag Hydroxide (ROLAIDS PO) Take 1 each by mouth as needed.   etodolac (LODINE) 400 MG tablet Take 1 tablet (400 mg total) by mouth 2 (two) times daily.   famotidine (PEPCID) 20 MG tablet Take 1 tablet 2 x /day with meals for Acid Indigestion   gabapentin (NEURONTIN) 400 MG capsule TAKE 2 CAPSULES(800 MG) BY MOUTH FOUR TIMES DAILY AS NEEDED   Glucosamine-Chondroitin 1500-1200 MG/30ML LIQD Take by mouth.   hydrOXYzine (ATARAX) 50 MG tablet Take 1-2 tablets (50-100 mg total) by mouth every 6 (six) hours as needed for anxiety.   Ibuprofen 200 MG CAPS Take by mouth daily.   lactobacillus acidophilus & bulgar (LACTINEX)  chewable tablet Chew 1 tablet by mouth 3 (three) times daily with meals.   lansoprazole (PREVACID SOLUTAB) 15 MG disintegrating tablet Take 1 tablet (15 mg total) by mouth daily at 12 noon.   levothyroxine (SYNTHROID) 75 MCG tablet TAKE 1 TABLET DAILY ON EMPTY STOMACH WITH ONLY WATER FOR 30 MINUTES & NO ANTACID MEDS, CALCIUM OR MAGNESIUM FOR 4 HOURS AND AVOID BIOTIN   LO LOESTRIN FE 1 MG-10 MCG / 10 MCG tablet Take 1 tablet by mouth daily.   MAGNESIUM PO Take 400 mg by mouth daily.   methocarbamol (ROBAXIN-750) 750 MG tablet Take 1 tablet (750 mg total) by mouth every 8 (eight) hours as needed for muscle spasms.   mirabegron ER (MYRBETRIQ) 25 MG TB24 tablet Take 1 tab daily for 1 week then 2 tabs (50 mg) daily for 1 week for bladder symptoms.   Multiple  Vitamins-Minerals (MULTIVITAMIN ADULTS PO) Take 1 tablet by mouth daily.   naproxen sodium (ALEVE) 220 MG tablet Take 220 mg by mouth.   nystatin (MYCOSTATIN) 100000 UNIT/ML suspension Swish 5 mL in the mouth and retain for as long as possible (several minutes) before swallowing.   Omega 3-6-9 Fatty Acids (OMEGA-3-6-9 PO) Take 1 Dose by mouth daily.   OVER THE COUNTER MEDICATION Takes Kratom 8 capsules, 600mg  each   oxyCODONE-acetaminophen (PERCOCET/ROXICET) 5-325 MG tablet Take 1 tablet by mouth every 4 (four) hours as needed.   promethazine (PHENERGAN) 25 MG tablet TAKE 1 TABLET BY MOUTH EVERY 6 HOURS AS NEEDED FOR NAUSEA OR HEADACHE   psyllium (METAMUCIL) 58.6 % powder Take 1 packet by mouth daily as needed.   sucralfate (CARAFATE) 1 g tablet Take 1 tablet (1 g total) by mouth 4 (four) times daily -  with meals and at bedtime.   temazepam (RESTORIL) 30 MG capsule TAKE 1 CAPSULE BY MOUTH EVERY NIGHT AT BEDTIME AS NEEDED FOR SLEEP   TURMERIC PO Take by mouth.   VITAMIN D PO Take 10,000 Units by mouth daily.   Zinc 50 MG TABS Take 1 tablet Daily   methylPREDNISolone (MEDROL) 4 MG tablet Taper from 6 pills po for one day to 1 pill po the last day over 6 days   No current facility-administered medications on file prior to visit.    ROS: all negative except what is noted in the HPI.   Physical Exam:  BP 118/76   Pulse 91   Temp 97.9 F (36.6 C)   Ht 5' 1.25" (1.556 m)   Wt 159 lb 3.2 oz (72.2 kg)   SpO2 99%   BMI 29.84 kg/m   General Appearance: NAD.  Awake, conversant and cooperative. Eyes: PERRLA, EOMs intact.  Sclera white.  Conjunctiva without erythema. Sinuses: No frontal/maxillary tenderness.  No nasal discharge. Nares patent.  ENT/Mouth: Ext aud canals clear.  Bilateral TMs w/DOL and without erythema or bulging. Hearing intact.  Posterior pharynx without swelling or exudate.  Tonsils without swelling or erythema.  Neck: Supple.  No masses, nodules or thyromegaly. Respiratory:  Effort is regular with non-labored breathing. Breath sounds are equal bilaterally without rales, rhonchi, wheezing or stridor.  Cardio: RRR with no MRGs. Brisk peripheral pulses without edema.  Abdomen: Midline and 2 finger widths right of umbilicus with palpable moveable firm nodule, tender to palpation.  Approximately 2 cm in diameter. RUQ pain upon palpation.  Active BS in all four quadrants.  Soft and without guarding, rebound tenderness. Lymphatics: Non tender without lymphadenopathy.  Musculoskeletal: Full ROM, 5/5 strength, normal ambulation.  No clubbing or cyanosis. Skin: Appropriate color for ethnicity. Warm without rashes, lesions, ecchymosis, ulcers.  Neuro: CN II-XII grossly normal. Normal muscle tone without cerebellar symptoms and intact sensation.   Psych: AO X 3,  appropriate mood and affect, insight and judgment.     Adela Glimpse, NP 7:19 PM [facility]

## 2022-10-29 DIAGNOSIS — R109 Unspecified abdominal pain: Secondary | ICD-10-CM

## 2022-10-29 DIAGNOSIS — K582 Mixed irritable bowel syndrome: Secondary | ICD-10-CM

## 2022-10-29 MED ORDER — DICYCLOMINE HCL 10 MG PO CAPS
10.0000 mg | ORAL_CAPSULE | Freq: Three times a day (TID) | ORAL | 2 refills | Status: DC
Start: 2022-10-29 — End: 2023-01-28

## 2022-11-02 NOTE — Patient Instructions (Signed)
   Cholecystitis Cholecystitis is irritation and swelling (inflammation) of the gallbladder. The gallbladder: Is an organ that is shaped like a pear. Is under the liver on the right side of the body. Stores bile. Bile helps the body break down (digest) the fats in food. This condition can occur all of a sudden. It needs to be treated. What are the causes? This condition may be caused by stones or lumps that form in the gallbladder (gallstones). Gallstones can block the tube (duct) that carries bile out of your gallbladder. Other causes include: Damage to the gallbladder due to less blood flow. Germs in the bile ducts. Scars, kinks, or adhesions in the bile ducts. Abnormal growths (tumors) in the liver, pancreas, or gallbladder. What increases the risk? You are more likely to develop this condition if: You are female and between the ages of 24-62. You take birth control pills. You use estrogen. You take certain medicines that make you more likely to develop gallstones. You are overweight (obese). You have a very bad reaction to an infection (sepsis). You have been hospitalized due to a serious condition, such as a burn or illness. You have not eaten or drank for a long time. What are the signs or symptoms? Symptoms of this condition include: Pain in the upper right part of the belly (abdomen). A lump over the gallbladder. Bloating in the belly. Feeling sick to your stomach (nauseous). Vomiting. Fever. Chills. How is this treated? This condition may be treated with: Medicines to treat pain. Giving fluids through an IV tube. Not eating or drinking (fasting). Antibiotic medicines. Surgery to take out your gallbladder. Gallbladder drainage. Follow these instructions at home: Medicines  Take over-the-counter and prescription medicines only as told by your doctor. If you were prescribed an antibiotic medicine, take it as told by your doctor. Do not stop taking it even if you  start to feel better. General instructions Follow instructions from your doctor about what to eat or drink. Do not eat or drink anything that makes you sick again. Do not smoke or use any products that contain nicotine or tobacco. If you need help quitting, ask your doctor. Keep all follow-up visits. Contact a doctor if: You have pain and your medicine does not help. You have a fever. Get help right away if: Your pain moves to: Another part of your belly. Your back. Your symptoms do not go away. You have new symptoms. These symptoms may be an emergency. Get help right away. Call 911. Do not wait to see if the symptoms will go away. Do not drive yourself to the hospital. Summary This condition may be caused by stones or lumps that form in the gallbladder (gallstones). A common symptom is pain in your belly. This condition may be treated with surgery to take out your gallbladder. Follow instructions from your doctor about what to eat or drink. This information is not intended to replace advice given to you by your health care provider. Make sure you discuss any questions you have with your health care provider. Document Revised: 09/11/2020 Document Reviewed: 09/11/2020 Elsevier Patient Education  2024 [employer].

## 2022-11-04 ENCOUNTER — Encounter: Payer: Self-pay | Admitting: Nurse Practitioner

## 2022-11-06 DIAGNOSIS — M47812 Spondylosis without myelopathy or radiculopathy, cervical region: Secondary | ICD-10-CM | POA: Diagnosis not present

## 2022-11-07 ENCOUNTER — Ambulatory Visit
Admission: RE | Admit: 2022-11-07 | Discharge: 2022-11-07 | Disposition: A | Discharge status: Home / Self Care | Payer: BC Managed Care – PPO | Source: Ambulatory Visit | Attending: Nurse Practitioner | Admitting: Nurse Practitioner

## 2022-11-07 DIAGNOSIS — K591 Functional diarrhea: Secondary | ICD-10-CM

## 2022-11-07 DIAGNOSIS — R1011 Right upper quadrant pain: Secondary | ICD-10-CM | POA: Diagnosis not present

## 2022-11-07 DIAGNOSIS — Z8719 Personal history of other diseases of the digestive system: Secondary | ICD-10-CM

## 2022-11-07 DIAGNOSIS — R197 Diarrhea, unspecified: Secondary | ICD-10-CM | POA: Diagnosis not present

## 2022-11-08 ENCOUNTER — Emergency Department (HOSPITAL_COMMUNITY)
Admission: EM | Admit: 2022-11-08 | Discharge: 2022-11-08 | Disposition: A | Discharge status: Home / Self Care | Payer: BC Managed Care – PPO | Source: Home / Self Care | Attending: Emergency Medicine | Admitting: Emergency Medicine

## 2022-11-08 ENCOUNTER — Other Ambulatory Visit: Payer: Self-pay

## 2022-11-08 ENCOUNTER — Encounter (HOSPITAL_COMMUNITY): Payer: Self-pay

## 2022-11-08 ENCOUNTER — Emergency Department (HOSPITAL_COMMUNITY): Payer: BC Managed Care – PPO

## 2022-11-08 DIAGNOSIS — F419 Anxiety disorder, unspecified: Secondary | ICD-10-CM | POA: Diagnosis not present

## 2022-11-08 DIAGNOSIS — R002 Palpitations: Secondary | ICD-10-CM | POA: Insufficient documentation

## 2022-11-08 LAB — COMPREHENSIVE METABOLIC PANEL
ALT: 18 U/L (ref 0–44)
AST: 29 U/L (ref 15–41)
Albumin: 4.1 g/dL (ref 3.5–5.0)
Alkaline Phosphatase: 40 U/L (ref 38–126)
Anion gap: 12 (ref 5–15)
BUN: <5 mg/dL — ABNORMAL LOW (ref 6–20)
CO2: 20 mmol/L — ABNORMAL LOW (ref 22–32)
Calcium: 8.7 mg/dL — ABNORMAL LOW (ref 8.9–10.3)
Chloride: 104 mmol/L (ref 98–111)
Creatinine, Ser: 0.79 mg/dL (ref 0.44–1.00)
GFR, Estimated: >60 mL/min (ref >60)
Glucose, Bld: 117 mg/dL — ABNORMAL HIGH (ref 70–99)
Potassium: 3.3 mmol/L — ABNORMAL LOW (ref 3.5–5.1)
Sodium: 136 mmol/L (ref 135–145)
Total Bilirubin: 0.9 mg/dL (ref 0.3–1.2)
Total Protein: 6.8 g/dL (ref 6.5–8.1)

## 2022-11-08 LAB — CBC
HCT: 35.9 % — ABNORMAL LOW (ref 36.0–46.0)
Hemoglobin: 12.1 g/dL (ref 12.0–15.0)
MCH: 31.6 pg (ref 26.0–34.0)
MCHC: 33.7 g/dL (ref 30.0–36.0)
MCV: 93.7 fL (ref 80.0–100.0)
Platelets: 390 10*3/uL (ref 150–400)
RBC: 3.83 MIL/uL — ABNORMAL LOW (ref 3.87–5.11)
RDW: 13.7 % (ref 11.5–15.5)
WBC: 7.4 10*3/uL (ref 4.0–10.5)
nRBC: 0 % (ref 0.0–0.2)

## 2022-11-08 LAB — RAPID URINE DRUG SCREEN, HOSP PERFORMED
Amphetamines: NOT DETECTED
Barbiturates: NOT DETECTED
Benzodiazepines: NOT DETECTED
Cocaine: NOT DETECTED
Opiates: NOT DETECTED
Tetrahydrocannabinol: NOT DETECTED

## 2022-11-08 LAB — ETHANOL: Alcohol, Ethyl (B): <10 mg/dL (ref <10)

## 2022-11-08 LAB — TROPONIN I (HIGH SENSITIVITY): Troponin I (High Sensitivity): 4 ng/L (ref <18)

## 2022-11-08 LAB — ACETAMINOPHEN LEVEL: Acetaminophen (Tylenol), Serum: <10 ug/mL — ABNORMAL LOW (ref 10–30)

## 2022-11-08 LAB — SALICYLATE LEVEL: Salicylate Lvl: <7 mg/dL — ABNORMAL LOW (ref 7.0–30.0)

## 2022-11-08 MED ORDER — LORAZEPAM 1 MG PO TABS
0.5000 mg | ORAL_TABLET | Freq: Once | ORAL | Status: AC
  Administered 2022-11-08: 0.5 mg via ORAL
  Filled 2022-11-08: qty 1

## 2022-11-08 NOTE — ED Notes (Signed)
Awaiting patient from lobby 

## 2022-11-08 NOTE — Discharge Instructions (Addendum)
Please discontinue your liver detox medications. You can try Benadryl 12.5 mg for symptom relief. I recommend close follow-up with PCP for reevaluation.  Please do not hesitate to return to emergency department if worrisome signs symptoms we discussed become apparent.

## 2022-11-08 NOTE — ED Provider Notes (Signed)
[place] [facility] AT [facility] Provider Note   CSN: [id number] Arrival date & time: 11/08/22  1035     History  Chief Complaint  Patient presents with   Anxiety    Charlotte Wise is a 47 y.o. female past med history of hyperlipidemia, schizophreniform disorder, thyroid disease, anxiety presents today for evaluation of palpitation.  Patient complains of anxiety and tachycardia that started last night.  Recently started taking "liver detox" medication for about a week.  She thinks that her medications are reacting to each other.  Patient stated that she tried taking some hydroxyzine without relief.  States she was up all night last night.  States she feels like her body is going to "explode".  Denies chest pain, shortness of breath, cough, fever, abdominal pain, bowel changes, urinary symptoms.   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 Authorizing Provider  acetaminophen (TYLENOL) 500 MG tablet Take 500 mg by mouth every 6 (six) hours as needed. Takes 0-6 per day depending on her pain level    [provider]  aluminum-magnesium hydroxide-simethicone (MAALOX) 200-200-20 MG/5ML SUSP Take 20 mLs by mouth 3 (three) times daily.    [provider]   AMBULATORY NON FORMULARY MEDICATION 1 Scoop daily. Medication Name: Clear Vite-PSF (k-84) Enzyme based, multivitamin, mineral & herbal dietary supplement powder    [provider]  AMBULATORY NON FORMULARY MEDICATION 1 tablet at bedtime. Medication Name: Domingo Dimes dietary supplement with proteolytiic enzymes for the gut    [provider]  AMBULATORY NON FORMULARY MEDICATION 2 tablets daily in the afternoon. Medication Name: Drenamin dietary supplement 3700 for adrenal support    [provider]  AMBULATORY NON FORMULARY MEDICATION 3 tablets daily in the afternoon. Medication Name: A-F betafood dietary supplement 0825 for gallbladder    [provider]  amitriptyline (ELAVIL) 25 MG tablet Take 1 tablet (25 mg total) by mouth at bedtime. 08/15/22   Cirigliano, Vito V, DO  Bismuth Subsalicylate (PEPTO-BISMOL PO) Take by mouth as needed.    [provider]  Ca Carbonate-Mag Hydroxide (ROLAIDS PO) Take 1 each by mouth as needed.    [provider]  dicyclomine (BENTYL) 10 MG capsule Take 1 capsule (10 mg total) by mouth 4 (four) times daily -  before meals and at bedtime. 10/29/22   Adela Glimpse, NP  etodolac (LODINE) 400 MG tablet Take 1 tablet (400 mg total) by mouth 2 (two) times daily. 05/02/21   Sater, Pearletha Furl, MD  famotidine (PEPCID) 20 MG tablet Take 1 tablet 2 x /day with meals for Acid Indigestion 05/05/22   Cranford, Archie Patten, NP  gabapentin (NEURONTIN) 400 MG capsule TAKE 2 CAPSULES(800 MG) BY MOUTH FOUR TIMES  DAILY AS NEEDED 03/13/22   Sater, Pearletha Furl, MD  Glucosamine-Chondroitin 1500-1200 MG/30ML LIQD Take by mouth.    [provider]  hydrOXYzine (ATARAX) 50 MG tablet Take 1-2 tablets (50-100 mg total) by mouth every 6 (six) hours as needed for anxiety. 03/13/22   Adela Glimpse, NP  Ibuprofen 200 MG CAPS Take by mouth daily.    [provider]  lactobacillus acidophilus & bulgar (LACTINEX) chewable tablet Chew 1  tablet by mouth 3 (three) times daily with meals. 04/03/22   Adela Glimpse, NP  lansoprazole (PREVACID SOLUTAB) 15 MG disintegrating tablet Take 1 tablet (15 mg total) by mouth daily at 12 noon. 08/07/22   Cirigliano, Vito V, DO  levothyroxine (SYNTHROID) 75 MCG tablet TAKE 1 TABLET DAILY ON EMPTY STOMACH WITH ONLY WATER FOR 30 MINUTES & NO ANTACID MEDS, CALCIUM OR MAGNESIUM FOR 4 HOURS AND AVOID BIOTIN 02/10/22   Raynelle Dick, NP  LO LOESTRIN FE 1 MG-10 MCG / 10 MCG tablet Take 1 tablet by mouth daily. 12/19/20   [provider]  MAGNESIUM PO Take 400 mg by mouth daily.    [provider]  methocarbamol (ROBAXIN-750) 750 MG tablet Take 1 tablet (750 mg total) by mouth every 8 (eight) hours as needed for muscle spasms. 03/06/22   Adela Glimpse, NP  mirabegron ER (MYRBETRIQ) 25 MG TB24 tablet Take 1 tab daily for 1 week then 2 tabs (50 mg) daily for 1 week for bladder symptoms. 06/18/21   Judd Gaudier, NP  Multiple Vitamins-Minerals (MULTIVITAMIN ADULTS PO) Take 1 tablet by mouth daily.    [provider]  naproxen sodium (ALEVE) 220 MG tablet Take 220 mg by mouth.    [provider]  nystatin (MYCOSTATIN) 100000 UNIT/ML suspension Swish 5 mL in the mouth and retain for as long as possible (several minutes) before swallowing. 04/03/22   Adela Glimpse, NP  Omega 3-6-9 Fatty Acids (OMEGA-3-6-9 PO) Take 1 Dose by mouth daily.    [provider]  OVER THE COUNTER MEDICATION Takes Kratom 8 capsules, 600mg  each    [provider]  oxyCODONE-acetaminophen (PERCOCET/ROXICET) 5-325 MG tablet Take 1 tablet by mouth every 4 (four) hours as needed. 06/03/22   [provider]  phentermine (ADIPEX-P) 37.5 MG tablet Take 1/2 to 1 tablet every Morning for Dieting & Weight Loss 10/28/22   Adela Glimpse, NP  promethazine (PHENERGAN) 25 MG tablet TAKE 1 TABLET BY MOUTH EVERY 6 HOURS AS NEEDED FOR NAUSEA OR HEADACHE 06/08/22   Adela Glimpse, NP   psyllium (METAMUCIL) 58.6 % powder Take 1 packet by mouth daily as needed.    [provider]  sucralfate (CARAFATE) 1 g tablet Take 1 tablet (1 g total) by mouth 4 (four) times daily -  with meals and at bedtime. 03/06/22   Cranford, Archie Patten, NP  temazepam (RESTORIL) 30 MG capsule TAKE 1 CAPSULE BY MOUTH EVERY NIGHT AT BEDTIME AS NEEDED FOR SLEEP 03/06/22   Adela Glimpse, NP  TURMERIC PO Take by mouth.    [provider]  VITAMIN D PO Take 10,000 Units by mouth daily.    [provider]  Zinc 50 MG TABS Take 1 tablet Daily 06/09/22   Adela Glimpse, NP      Allergies    Hydrocodone, Oxycodone-acetaminophen, and Penicillins    Review of Systems   Review of Systems Negative except as per HPI.  Physical Exam Updated Vital Signs BP (!) 118/106 (BP Location: Right Arm)   Pulse 70  Temp 98.2 F (36.8 C) (Oral)   Resp 14   SpO2 100%  Physical Exam Vitals and nursing note reviewed.  Constitutional:      Appearance: Normal appearance.  HENT:     Head: Normocephalic and atraumatic.     Mouth/Throat:     Mouth: Mucous membranes are moist.  Eyes:     General: No scleral icterus. Cardiovascular:     Rate and Rhythm: Normal rate and regular rhythm.     Pulses: Normal pulses.     Heart sounds: Normal heart sounds.  Pulmonary:     Effort: Pulmonary effort is normal.     Breath sounds: Normal breath sounds.  Abdominal:     General: Abdomen is flat.     Palpations: Abdomen is soft.     Tenderness: There is no abdominal tenderness.  Musculoskeletal:        General: No deformity.  Skin:    General: Skin is warm.     Findings: No rash.  Neurological:     General: No focal deficit present.     Mental Status: She is alert.  Psychiatric:        Mood and Affect: Mood normal.     Comments: Speech is pressured and repetitive.     ED Results / Procedures / Treatments   Labs (all labs ordered are listed, but only abnormal results are displayed) Labs  Reviewed  COMPREHENSIVE METABOLIC PANEL - Abnormal; Notable for the following components:      Result Value   Potassium 3.3 (*)    CO2 20 (*)    Glucose, Bld 117 (*)    BUN <5 (*)    Calcium 8.7 (*)    All other components within normal limits  SALICYLATE LEVEL - Abnormal; Notable for the following components:   Salicylate Lvl <7.0 (*)    All other components within normal limits  ACETAMINOPHEN LEVEL - Abnormal; Notable for the following components:   Acetaminophen (Tylenol), Serum <10 (*)    All other components within normal limits  CBC - Abnormal; Notable for the following components:   RBC 3.83 (*)    HCT 35.9 (*)    All other components within normal limits  ETHANOL  RAPID URINE DRUG SCREEN, HOSP PERFORMED  TROPONIN I (HIGH SENSITIVITY)  TROPONIN I (HIGH SENSITIVITY)    EKG None  Radiology DG Chest Portable 1 View  Result Date: 11/08/2022 CLINICAL DATA:  Palpitations, anxiety. EXAM: PORTABLE CHEST 1 VIEW COMPARISON:  None Available. FINDINGS: The heart size and mediastinal contours are within normal limits. Both lungs are clear. Fixation hardware overlies the lower cervical spine. IMPRESSION: No active cardiopulmonary disease. Electronically Signed   By: Romona Curls M.D.   On: 11/08/2022 14:25   US Abdomen Limited RUQ (LIVER/GB)  Result Date: 11/07/2022 CLINICAL DATA:  Right upper quadrant pain and diarrhea EXAM: ULTRASOUND ABDOMEN LIMITED RIGHT UPPER QUADRANT COMPARISON:  None Available. FINDINGS: Gallbladder: No gallstones or wall thickening visualized. No sonographic Murphy sign noted by sonographer. Common bile duct: Diameter: 3 mm Liver: No focal lesion identified. Within normal limits in parenchymal echogenicity. Portal vein is patent on color Doppler imaging with normal direction of blood flow towards the liver. Other: None. IMPRESSION: No cholelithiasis or sonographic evidence for acute cholecystitis. Electronically Signed   By: Annia Belt M.D.   On: 11/07/2022  08:27    Procedures Procedures    Medications Ordered in ED Medications  LORazepam (ATIVAN) tablet 0.5 mg (0.5 mg Oral Given 11/08/22 1347)  ED Course/ Medical Decision Making/ A&P                                 Medical Decision Making Amount and/or Complexity of Data Reviewed Labs: ordered. Radiology: ordered.  Risk Prescription drug management.   This patient presents to the ED for palpitations, anxiety, this involves an extensive number of treatment options, and is a complaint that carries with a high risk of complications and morbidity.  The differential diagnosis includes medication side effect, ACS/MI, pneumonia, pneumothorax, MDD, anxiety, electrolyte abnormalities, psychosis.  This is not an exhaustive list.  Lab tests: I ordered and personally interpreted labs.  The pertinent results include: WBC unremarkable. Hbg unremarkable. Platelets unremarkable. Electrolytes unremarkable. BUN, creatinine unremarkable.  Troponins negative.  UDS negative.  Imaging studies: I ordered imaging studies, personally reviewed, interpreted imaging and agree with the radiologist's interpretations. The results include: Negative chest x-ray.  Problem list/ ED course/ Critical interventions/ Medical management: HPI: See above Vital signs within normal range and stable throughout visit. Laboratory/imaging studies significant for: See above. On physical examination, patient is afebrile and appears in no acute distress.  Patient is calm and cooperative.  However speech is pressured and repetitive.  Unlikely ACS/MI, patient has no chest pain, troponins negative, EKG with no ischemic changes.  Patient states recently she started taking "liver detox" medications and was wondering if those medications cause her symptoms.  Workup today was overall reassuring.  Negative chest x-ray.  CBC, CMP unremarkable.  UDS is negative.  No evidence of other acute emergent causes of palpitations at this point.   Advised patient to follow-up with her primary care physician for reevaluation and management.  Strict return precaution discussed.  Cardiac monitoring/EKG: The patient was maintained on a cardiac monitor.  I personally reviewed and interpreted the cardiac monitor which showed an underlying rhythm of: sinus rhythm.  Additional history obtained: External records from outside source obtained and reviewed including: Chart review including previous notes, labs, imaging.  Consultations obtained:  Disposition Continued outpatient therapy. Follow-up with PCP recommended for reevaluation of symptoms. Treatment plan discussed with patient.  Pt acknowledged understanding was agreeable to the plan. Worrisome signs and symptoms were discussed with patient, and patient acknowledged understanding to return to the ED if they noticed these signs and symptoms. Patient was stable upon discharge.   This chart was dictated using voice recognition software.  Despite best efforts to proofread,  errors can occur which can change the documentation meaning.          Final Clinical Impression(s) / ED Diagnoses Final diagnoses:  Palpitations  Anxiety    Rx / DC Orders ED Discharge Orders          Ordered    Ambulatory referral to Cardiology       Comments: If you have not heard from the Cardiology office within the next 72 hours please call [phone].   11/08/22 1506              Jeanelle Malling, PA 11/08/22 1907    Virgina Norfolk, DO 11/09/22 [id number]

## 2022-11-08 NOTE — ED Triage Notes (Signed)
Pt arrived POV from home c/o anxiety, tachycardia that started last night. Pt thinks her medications are reacting with each other. Pt tried taking some hydroxyzine without relief so now she states she has been up all night.

## 2022-11-08 NOTE — ED Notes (Signed)
Patient discharged by this RN. Patient verbalized understanding and did not express any additional concerns. Ambulatory at discharge and in no acute distress.

## 2022-11-09 ENCOUNTER — Encounter: Payer: Self-pay | Admitting: Nurse Practitioner

## 2022-11-10 ENCOUNTER — Encounter: Payer: Self-pay | Admitting: Nurse Practitioner

## 2022-11-10 DIAGNOSIS — R002 Palpitations: Secondary | ICD-10-CM

## 2022-11-11 ENCOUNTER — Encounter: Payer: Self-pay | Admitting: Nurse Practitioner

## 2022-11-11 ENCOUNTER — Encounter: Payer: Self-pay | Admitting: Gastroenterology

## 2022-11-11 ENCOUNTER — Other Ambulatory Visit: Payer: Self-pay | Admitting: Nurse Practitioner

## 2022-11-11 ENCOUNTER — Ambulatory Visit: Payer: BC Managed Care – PPO | Admitting: Gastroenterology

## 2022-11-11 ENCOUNTER — Telehealth: Payer: Self-pay | Admitting: *Deleted

## 2022-11-11 ENCOUNTER — Ambulatory Visit: Payer: BC Managed Care – PPO | Attending: Nurse Practitioner

## 2022-11-11 VITALS — BP 132/84 | HR 72 | Ht 61.0 in | Wt 152.0 lb

## 2022-11-11 DIAGNOSIS — K219 Gastro-esophageal reflux disease without esophagitis: Secondary | ICD-10-CM

## 2022-11-11 DIAGNOSIS — K580 Irritable bowel syndrome with diarrhea: Secondary | ICD-10-CM | POA: Diagnosis not present

## 2022-11-11 DIAGNOSIS — R194 Change in bowel habit: Secondary | ICD-10-CM | POA: Diagnosis not present

## 2022-11-11 DIAGNOSIS — R1013 Epigastric pain: Secondary | ICD-10-CM

## 2022-11-11 DIAGNOSIS — K032 Erosion of teeth: Secondary | ICD-10-CM

## 2022-11-11 DIAGNOSIS — R109 Unspecified abdominal pain: Secondary | ICD-10-CM

## 2022-11-11 DIAGNOSIS — R002 Palpitations: Secondary | ICD-10-CM

## 2022-11-11 MED ORDER — LOSARTAN POTASSIUM 25 MG PO TABS: 25.0000 mg | ORAL_TABLET | Freq: Every day | ORAL | 0 refills | Status: DC

## 2022-11-11 MED ORDER — VIBERZI 100 MG PO TABS: 100.0000 mg | ORAL_TABLET | Freq: Two times a day (BID) | ORAL | 3 refills | Status: DC

## 2022-11-11 MED ORDER — AMITRIPTYLINE HCL 50 MG PO TABS: 50.0000 mg | ORAL_TABLET | Freq: Every day | ORAL | 3 refills | Status: DC

## 2022-11-11 NOTE — Progress Notes (Signed)
Chief Complaint:    Diarrhea, GERD  GI History: 47 year old female with a history of hepatic steatosis, anxiety, schizophreniform disorder, headaches, depression, hyperlipidemia, hypothyroidism, follows in the GI clinic for the following:   1) GERD: History of enamel erosion but no typical reflux symptoms until 02/2020 (heartburn, upper abdominal pain, early satiety) -02/2020: Normal CBC, CMP.  H. pylori breath test negative.  Started on Pepcid -03/2020: Changed Pepcid to omeprazole, discontinued all NSAIDs -04/2020: EGD: 2 cm tongue of salmon-colored mucosa (path negative for Barrett's), Hill grade 1, minimal non-H. 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  Stopped taking pantoprazole on her own and went back to omeprazole.  Feels intolerant of capsules/tablets and wants a liquid or chewable.  She crushes any tablets (even APAP) and opens capsules due to feeling of abdominal pain if she does not do this.  Started Prevacid referred for EM and pH/impedance testing off acid suppression therapy   2) IBS: Was previously predominantly constipation and well controlled with OTC mag supplements, but in 2022 started developing watery, nonbloody diarrhea with associated abdominal pain/cramping.  Now symptoms alternating, but more so diarrhea predominant. - 07/2020: Negative/normal GI PCR panel, calprotectin - 11/09/2020: CT AP: Gas and stool throughout the colon, otherwise normal - 11/21/2020: Normal CBC, CMP.  TSH 5.22 with repeat 3.57 in 12/2020 - 05/03/2021: GI follow-up.  Symptoms improving with Pepto-Bismol, psyllium, OTC digestive enzyme.  Normal pancreatic elastase, CBC, CMP, TSH - 08/07/2022: Started Elavil with good response and increased to 25 mg qhs.   Endoscopic History: -EGD (04/2020): 2 cm tongue of salmon-colored mucosa (path negative  for Barrett's),; mucosa, Hill grade 1, minimal non-H. pylori gastritis, normal duodenum -Colonoscopy (04/2020): 1 SSP, 1 traditional serrated adenoma, 3 benign polyps.  Sigmoid/cecal diverticulosis, grade 2 internal hemorrhoids.  Repeat in 3 years  HPI:     Patient is a 47 y.o. female presenting to the Gastroenterology Clinic for follow-up.  Was last seen by me on 08/07/2022.  Main issue at that time was reflux with waterbrash.  Was started on Prevacid SoluTabs and referred for EM and pH/impedance testing (off PPI).  Also referred for SIBO testing (not yet done) for evaluation of abdominal cramping, dyspepsia, change in bowel habits, and recommended starting amitriptyline.  H. pylori was again negative.  Overall improvement in IBS symptoms with Elavil and increased to 25 mg daily.  She would like to increase further to see if she can capture additional control of IBS symptoms.  She went to the ER on 11/07/2022 with palpitations. - RUQ [place]: Normal - CXR: Normal - K 3.3, CO2 20, otherwise normal CMP.  Normal CBC, troponin.  Negative APAP, salicylate, ethanol, UDS - Diagnosed with palpitations and anxiety and discharged home  Has also been following with pain management for the last couple of months.  Was started on oxycodone and then changed to buprenorphine patches.  Currently prescribed Percocet and Robaxin.  Was seen by her PCM earlier this month.  Started on phentermine (has since stopped), Bentyl (not taking).    Scheduled for EM and pH/impedance on 01/07/2023.  Main issue today is diarrhea. Thinks it worsens with Pepto or imodium. No improvement with fiber.  For her reflux, she reports taking the Prevacid for 1 month and no clear change in sxs, so she stopped taking. Went to dentist last week and was told she needs 10 fillings and concerned  that this is all reflux induced enamel breakdown.   Stopped taking phentermine last week.   Review of systems:     No chest pain, no SOB, no fevers,  no urinary sx   Past Medical History:  Diagnosis Date   Allergy    Anxiety    Arthritis    Diverticulosis    Gastritis    Hyperlipidemia 05/12/2018   Major depressive disorder, 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 Takes 0-6 per day depending on her pain level     aluminum-magnesium hydroxide-simethicone (MAALOX) 200-200-20 MG/5ML SUSP Take 20 mLs by mouth 3 (three) times daily.     AMBULATORY NON FORMULARY MEDICATION 1 Scoop daily. Medication Name: Clear Vite-PSF (k-84) Enzyme based, multivitamin, mineral & herbal dietary supplement powder     AMBULATORY NON FORMULARY MEDICATION 1 tablet at bedtime. Medication Name: Domingo Dimes dietary supplement with proteolytiic enzymes for the gut     AMBULATORY NON FORMULARY MEDICATION 2 tablets daily in the afternoon. Medication Name: Drenamin dietary supplement 3700 for adrenal support     AMBULATORY NON FORMULARY MEDICATION 3 tablets daily in the afternoon. Medication Name: A-F betafood dietary supplement 0825 for gallbladder     amitriptyline (ELAVIL) 25 MG tablet Take 1 tablet (25 mg total) by mouth at bedtime. 90 tablet 3   Bismuth Subsalicylate (PEPTO-BISMOL PO) Take by mouth as needed.     Ca Carbonate-Mag Hydroxide (ROLAIDS PO) Take 1 each by mouth as needed.     dicyclomine (BENTYL) 10 MG capsule Take 1 capsule (10 mg total) by mouth 4 (four) times daily -  before meals and at bedtime. 120 capsule 2   etodolac (LODINE) 400 MG tablet Take 1 tablet (400 mg total) by mouth 2 (two) times daily. 60 tablet 5   famotidine (PEPCID) 20 MG tablet Take 1  tablet 2 x /day with meals for Acid Indigestion 60 tablet 2   gabapentin (NEURONTIN) 400 MG capsule TAKE 2 CAPSULES(800 MG) BY MOUTH FOUR TIMES DAILY AS NEEDED 240 capsule 3   Glucosamine-Chondroitin 1500-1200 MG/30ML LIQD Take by mouth.     hydrOXYzine (ATARAX) 50 MG tablet Take 1-2 tablets (50-100 mg total) by mouth every 6 (six) hours as needed for anxiety. 120 tablet 0   Ibuprofen 200 MG CAPS Take by mouth daily.     lactobacillus acidophilus & bulgar (LACTINEX) chewable tablet Chew 1 tablet by mouth 3 (three) times daily with meals. 90 tablet 1   lansoprazole (PREVACID SOLUTAB) 15 MG disintegrating tablet Take 1 tablet (15 mg total) by mouth daily at 12 noon. 90 tablet 3   levothyroxine (SYNTHROID) 75 MCG tablet TAKE 1 TABLET DAILY ON EMPTY STOMACH WITH ONLY WATER FOR 30 MINUTES & NO ANTACID MEDS, CALCIUM OR MAGNESIUM FOR 4 HOURS AND AVOID BIOTIN 90 tablet 3   LO LOESTRIN FE 1 MG-10 MCG / 10 MCG tablet Take 1 tablet by mouth daily.     MAGNESIUM PO Take 400 mg by mouth daily.     methocarbamol (ROBAXIN-750) 750 MG tablet Take 1 tablet (750 mg total) by mouth every 8 (eight) hours as needed for muscle  spasms. 60 tablet 1   mirabegron ER (MYRBETRIQ) 25 MG TB24 tablet Take 1 tab daily for 1 week then 2 tabs (50 mg) daily for 1 week for bladder symptoms. 21 tablet 0   Multiple Vitamins-Minerals (MULTIVITAMIN ADULTS PO) Take 1 tablet by mouth daily.     Omega 3-6-9 Fatty Acids (OMEGA-3-6-9 PO) Take 1 Dose by mouth daily.     OVER THE COUNTER MEDICATION Takes Kratom 8 capsules, 600mg  each     oxyCODONE-acetaminophen (PERCOCET/ROXICET) 5-325 MG tablet Take 1 tablet by mouth every 4 (four) hours as needed.     phentermine (ADIPEX-P) 37.5 MG tablet Take 1/2 to 1 tablet every Morning for Dieting & Weight Loss 30 tablet 0   promethazine (PHENERGAN) 25 MG tablet TAKE 1 TABLET BY MOUTH EVERY 6 HOURS AS NEEDED FOR NAUSEA OR HEADACHE 30 tablet 0   psyllium (METAMUCIL) 58.6 % powder Take 1 packet by mouth  daily as needed.     sucralfate (CARAFATE) 1 g tablet Take 1 tablet (1 g total) by mouth 4 (four) times daily -  with meals and at bedtime. 360 tablet 0   TURMERIC PO Take by mouth.     VITAMIN D PO Take 10,000 Units by mouth daily.     Zinc 50 MG TABS Take 1 tablet Daily  0   naproxen sodium (ALEVE) 220 MG tablet Take 220 mg by mouth. (Patient not taking: Reported on 11/11/2022)     nystatin (MYCOSTATIN) 100000 UNIT/ML suspension Swish 5 mL in the mouth and retain for as long as possible (several minutes) before swallowing. (Patient not taking: Reported on 11/11/2022) 60 mL 1   temazepam (RESTORIL) 30 MG capsule TAKE 1 CAPSULE BY MOUTH EVERY NIGHT AT BEDTIME AS NEEDED FOR SLEEP (Patient not taking: Reported on 11/11/2022) 30 capsule 0   No current facility-administered medications for this visit.    Physical Exam:     BP 132/84   Pulse 72   Ht 5\' 1"  (1.549 m)   Wt 152 lb (68.9 kg)   BMI 28.72 kg/m   GENERAL:  Pleasant female in NAD PSYCH: : Cooperative, normal affect EENT:  conjunctiva pink, mucous membranes moist, neck supple without masses CARDIAC:  RRR, no murmur heard, no peripheral edema PULM: Normal respiratory effort, lungs CTA bilaterally, no wheezing ABDOMEN:  Nondistended, soft, nontender.  SKIN:  turgor, no lesions seen Musculoskeletal:  Normal muscle tone, normal strength NEURO: Alert and oriented x 3, no focal neurologic deficits   IMPRESSION and PLAN:    1) IBS 2) Abdominal cramping 3) Change in bowel habits - Will trial course of Viberzi.  If no improvement (or not approved by insurance), will trial Lomotil - Recommended again trialing fiber with Benefiber - Discussed possibility of polypharmacy also playing a role - Good response to Elavil.  Will increase to 50 mg qhs and monitor for continued improvement - Stopped phentermine a week ago - I encouraged her to get the SIBO test done as previously ordered  4) GERD 5) Waterbrash 6) Enamel erosion Again had a  long conversation today regarding the pathophysiology of reflux.  Discussed the possibility of GERD along with esophageal hypersensitivity, functional heartburn, etc.  Discussed the role/utility of ordering a pH/impedance study to be done on therapy vs off therapy. - Will retrial lansoprazole.  She will first adjust the Elavil as above and monitor for response, then start the lansoprazole and message me 2-4 weeks later to update me on efficacy - If responding to lansoprazole, will  modify and plan for pH/impedance study to be done ON therapy to evaluate for breakthrough reflux - If no appreciable response to the lansoprazole, will keep plan in place for pH/impedance study to be done OFF therapy to evaluate for hypersensitivity, functional symptoms  7) History of colon polyps - Repeat colonoscopy in 04/2023 for ongoing polyp surveillance   RTC in 3-4 months or sooner prn  I spent 45 minutes of time, including in depth chart review, independent review of results as outlined above, communicating results with the patient directly, face-to-face time with the patient, coordinating care, ordering studies and medications as appropriate, and documentation.              Shellia Cleverly ,DO, FACG 11/11/2022, 8:32 AM

## 2022-11-11 NOTE — Telephone Encounter (Signed)
Patient is calling because she would like to know how much the Zio Patch Monitor would cost her.

## 2022-11-11 NOTE — Patient Instructions (Addendum)
You have been scheduled for an appointment with Dr. Barron Alvine on 02/26/23 at 8:20 AM . Please arrive 10 minutes early for your appointment.   We have sent the following medications to your pharmacy for you to pick up at your convenience:  Elavil 50 MG Viberzi 100 MG _______________________________________________________  If your blood pressure at your visit was 140/90 or greater, please contact your primary care physician to follow up on this.  _______________________________________________________  If you are age 84 or younger, your body mass index should be between 19-25. Your Body mass index is 28.72 kg/m. If this is out of the aformentioned range listed, please consider follow up with your Primary Care Provider.   __________________________________________________________  The [facility] providers would like to encourage you to use [facility] to communicate with providers for non-urgent requests or questions.  Due to long hold times on the telephone, sending your provider a message by [facility] may be a faster and more efficient way to get a response.  Please allow 48 business hours for a response.  Please remember that this is for non-urgent requests.   Due to recent changes in healthcare laws, you may see the results of your imaging and laboratory studies on MyChart before your provider has had a chance to review them.  We understand that in some cases there may be results that are confusing or concerning to you. Not all laboratory results come back in the same time frame and the provider may be waiting for multiple results in order to interpret others.  Please give [place] 48 hours in order for your provider to thoroughly review all the results before contacting the office for clarification of your results.     Thank you for choosing me and [facility].  Vito Cirigliano, D.O.

## 2022-11-11 NOTE — Telephone Encounter (Signed)
The only cost from [facility] would be the physicians interpretation of the monitor results. [employer] would be billed $85.00 for the interpretation. Please call [employer] directly at [phone] to inquire about your out of pocket costs on their monitor service.  They have been provided your insurance information and will be able to give you an out of pocket estimate based on your plan.

## 2022-11-11 NOTE — Progress Notes (Unsigned)
Enrolled patient for a 14 day Zio XT monitor to be mailed to patients home  DOD to read

## 2022-11-12 ENCOUNTER — Other Ambulatory Visit: Payer: Self-pay | Admitting: Nurse Practitioner

## 2022-11-12 ENCOUNTER — Telehealth: Payer: Self-pay | Admitting: Gastroenterology

## 2022-11-12 ENCOUNTER — Encounter: Payer: Self-pay | Admitting: Nurse Practitioner

## 2022-11-12 ENCOUNTER — Ambulatory Visit: Payer: BC Managed Care – PPO | Admitting: Nurse Practitioner

## 2022-11-12 VITALS — BP 140/80 | HR 95 | Temp 97.9°F | Ht 61.25 in | Wt 152.4 lb

## 2022-11-12 DIAGNOSIS — R002 Palpitations: Secondary | ICD-10-CM

## 2022-11-12 DIAGNOSIS — Z09 Encounter for follow-up examination after completed treatment for conditions other than malignant neoplasm: Secondary | ICD-10-CM

## 2022-11-12 DIAGNOSIS — I158 Other secondary hypertension: Secondary | ICD-10-CM

## 2022-11-12 DIAGNOSIS — Z79899 Other long term (current) drug therapy: Secondary | ICD-10-CM

## 2022-11-12 MED ORDER — METOPROLOL TARTRATE 25 MG PO TABS: 25.0000 mg | ORAL_TABLET | Freq: Every day | ORAL | 0 refills | Status: DC

## 2022-11-12 NOTE — Progress Notes (Addendum)
Hospital follow up  Assessment and Plan: Hospital visit follow up for:   Hospital discharge follow up: Reviewed discharge instructions in full including medication changes, diagnostics, labs, and future follow ups appointment. All questions and concerns addressed.    Other secondary hypertension Stop Losartan Start Metoprolol Discussed DASH (Dietary Approaches to Stop Hypertension) DASH diet is lower in sodium than a typical American diet. Cut back on foods that are high in saturated fat, cholesterol, and trans fats. Eat more whole-grain foods, fish, poultry, and nuts Remain active and exercise as tolerated daily.  Monitor BP at home-Call if greater than 130/80.  Check CMP/CBC  - metoprolol tartrate (LOPRESSOR) 25 MG tablet; Take 1 tablet (25 mg total) by mouth daily.  Dispense: 30 tablet; Refill: 0  Medication management All medications discussed and reviewed in full. All questions and concerns regarding medications addressed.    - metoprolol tartrate (LOPRESSOR) 25 MG tablet; Take 1 tablet (25 mg total) by mouth daily.  Dispense: 30 tablet; Refill: 0  Palpitations Continue cardiac holter monitor placement Report to ER for any increase in stroke like symptoms, including HA, N/V, paralysis, difficulty speaking, trouble walking, confusion, vision changes, CP, heart palpitations, SOB, diaphoresis.  All medications were reviewed with patient and family and fully reconciled. All questions answered fully, and patient and family members were encouraged to call the office with any further questions or concerns. Discussed goal to avoid readmission related to this diagnosis.   Over 25 minutes of exam, counseling, chart review, and complex, high/moderate level critical decision making was performed this visit.   Future Appointments  Date Time Provider Department Center  12/25/2022  9:30 AM Adela Glimpse, NP [facility] None  01/28/2023  8:00 AM Quintella Reichert, MD [facility]  02/26/2023  8:20 AM Cirigliano, Verlin Dike, DO [facility]  06/09/2023  3:00 PM Adela Glimpse, NP [facility] None     HPI 47 y.o.female presents for follow up for transition from recent hospitalization or [facility] stay. Admit date to the hospital was 11/08/22, patient was discharged from the hospital on 11/08/22 and our clinical staff contacted the office the day after discharge to set up a follow up appointment. The discharge summary, medications, and diagnostic test results were reviewed before meeting with the patient. The patient was admitted for anxiety, and heart palpitations:   Patient present for evaluation of palpitations.  Patient complained of anxiety and tachycardia that started in the night.  Recently started taking "liver detox" medication for about a week.  She thinks that her medications are reacting to each other.  Patient stated that she tried taking some hydroxyzine without relief.  States she was up all during the night.  States she felt like her body was going to "explode".  Denied chest pain, shortness of breath, cough, fever, abdominal pain, bowel changes, urinary symptoms.  No EKG was performed in the ER.  Chest X-ray was negative.     She has continued to have elevated BP readings post ER and was placed on Losartan.  She states that she took her first dose last night and had the same episode of heart palpitations occur along with elevation in BP.  She no longer wants to take this medications. She is awaiting a ZIO patch heart monitor. She continues to feel as though she has become hypersensitive to taking most medication.  She reports being able to tolerate the Gabapentin and Oxycodone.  Images while in the hospital: DG Chest Portable 1 View  Result Date: 11/08/2022 CLINICAL  DATA:  Palpitations, anxiety. EXAM: PORTABLE CHEST 1 VIEW COMPARISON:  None Available. FINDINGS: The heart size and mediastinal contours are within normal limits. Both lungs are clear.  Fixation hardware overlies the lower cervical spine. IMPRESSION: No active cardiopulmonary disease. Electronically Signed   By: Romona Curls M.D.   On: 11/08/2022 14:25     Current Outpatient Medications (Endocrine & Metabolic):    levothyroxine (SYNTHROID) 75 MCG tablet, TAKE 1 TABLET DAILY ON EMPTY STOMACH WITH ONLY WATER FOR 30 MINUTES & NO ANTACID MEDS, CALCIUM OR MAGNESIUM FOR 4 HOURS AND AVOID BIOTIN   LO LOESTRIN FE 1 MG-10 MCG / 10 MCG tablet, Take 1 tablet by mouth daily.  Current Outpatient Medications (Cardiovascular):    metoprolol tartrate (LOPRESSOR) 25 MG tablet, Take 1 tablet (25 mg total) by mouth daily.   losartan (COZAAR) 25 MG tablet, TAKE 1 TABLET(25 MG) BY MOUTH DAILY (Patient not taking: Reported on 11/12/2022)  Current Outpatient Medications (Respiratory):    promethazine (PHENERGAN) 25 MG tablet, TAKE 1 TABLET BY MOUTH EVERY 6 HOURS AS NEEDED FOR NAUSEA OR HEADACHE  Current Outpatient Medications (Analgesics):    acetaminophen (TYLENOL) 500 MG tablet, Take 500 mg by mouth every 6 (six) hours as needed. Takes 0-6 per day depending on her pain level   Ibuprofen 200 MG CAPS, Take by mouth daily.   oxyCODONE-acetaminophen (PERCOCET/ROXICET) 5-325 MG tablet, Take 1 tablet by mouth every 4 (four) hours as needed.   etodolac (LODINE) 400 MG tablet, Take 1 tablet (400 mg total) by mouth 2 (two) times daily. (Patient not taking: Reported on 11/12/2022)   naproxen sodium (ALEVE) 220 MG tablet, Take 220 mg by mouth. (Patient not taking: Reported on 11/11/2022)   Current Outpatient Medications (Other):    aluminum-magnesium hydroxide-simethicone (MAALOX) 200-200-20 MG/5ML SUSP, Take 20 mLs by mouth 3 (three) times daily.   AMBULATORY NON FORMULARY MEDICATION, 1 Scoop daily. Medication Name: Clear Vite-PSF (k-84) Enzyme based, multivitamin, mineral & herbal dietary supplement powder   AMBULATORY NON FORMULARY MEDICATION, 1 tablet at bedtime. Medication Name: Domingo Dimes  dietary supplement with proteolytiic enzymes for the gut   AMBULATORY NON FORMULARY MEDICATION, 2 tablets daily in the afternoon. Medication Name: Drenamin dietary supplement 3700 for adrenal support   AMBULATORY NON FORMULARY MEDICATION, 3 tablets daily in the afternoon. Medication Name: A-F betafood dietary supplement 0825 for gallbladder   Bismuth Subsalicylate (PEPTO-BISMOL PO), Take by mouth as needed.   Ca Carbonate-Mag Hydroxide (ROLAIDS PO), Take 1 each by mouth as needed.   cyclobenzaprine (FLEXERIL) 5 MG tablet, 1 tablet as needed Orally Three times a day   DULoxetine (CYMBALTA) 30 MG capsule, 1 capsule Orally Once at night x 2 weeks, then two capsule at night if no side effects for 30 days   gabapentin (NEURONTIN) 400 MG capsule, TAKE 2 CAPSULES(800 MG) BY MOUTH FOUR TIMES DAILY AS NEEDED   Glucosamine-Chondroitin 1500-1200 MG/30ML LIQD, Take by mouth.   hydrOXYzine (ATARAX) 50 MG tablet, Take 1-2 tablets (50-100 mg total) by mouth every 6 (six) hours as needed for anxiety.   lansoprazole (PREVACID SOLUTAB) 15 MG disintegrating tablet, Take 1 tablet (15 mg total) by mouth daily at 12 noon.   MAGNESIUM PO, Take 400 mg by mouth daily.   methocarbamol (ROBAXIN-750) 750 MG tablet, Take 1 tablet (750 mg total) by mouth every 8 (eight) hours as needed for muscle spasms.   Multiple Vitamins-Minerals (MULTIVITAMIN ADULTS PO), Take 1 tablet by mouth daily.   sucralfate (CARAFATE) 1 g tablet, Take 1  tablet (1 g total) by mouth 4 (four) times daily -  with meals and at bedtime.   TURMERIC PO, Take by mouth.   VITAMIN D PO, Take 10,000 Units by mouth daily.   Wheat Dextrin (BENEFIBER PO), Take by mouth.   Zinc 50 MG TABS, Take 1 tablet Daily   amitriptyline (ELAVIL) 50 MG tablet, Take 1 tablet (50 mg total) by mouth at bedtime. (Patient not taking: Reported on 11/12/2022)   dicyclomine (BENTYL) 10 MG capsule, Take 1 capsule (10 mg total) by mouth 4 (four) times daily -  before meals and at bedtime.  (Patient not taking: Reported on 11/12/2022)   Eluxadoline (VIBERZI) 100 MG TABS, Take 1 tablet (100 mg total) by mouth 2 (two) times daily before a meal. (Patient not taking: Reported on 11/12/2022)   famotidine (PEPCID) 20 MG tablet, Take 1 tablet 2 x /day with meals for Acid Indigestion (Patient not taking: Reported on 11/12/2022)   lactobacillus acidophilus & bulgar (LACTINEX) chewable tablet, Chew 1 tablet by mouth 3 (three) times daily with meals. (Patient not taking: Reported on 11/12/2022)   mirabegron ER (MYRBETRIQ) 25 MG TB24 tablet, Take 1 tab daily for 1 week then 2 tabs (50 mg) daily for 1 week for bladder symptoms. (Patient not taking: Reported on 11/12/2022)   nystatin (MYCOSTATIN) 100000 UNIT/ML suspension, Swish 5 mL in the mouth and retain for as long as possible (several minutes) before swallowing. (Patient not taking: Reported on 11/11/2022)   Omega 3-6-9 Fatty Acids (OMEGA-3-6-9 PO), Take 1 Dose by mouth daily. (Patient not taking: Reported on 11/12/2022)   OVER THE COUNTER MEDICATION, Takes Kratom 8 capsules, 600mg  each (Patient not taking: Reported on 11/12/2022)   phentermine (ADIPEX-P) 37.5 MG tablet, Take 1/2 to 1 tablet every Morning for Dieting & Weight Loss (Patient not taking: Reported on 11/12/2022)   psyllium (METAMUCIL) 58.6 % powder, Take 1 packet by mouth daily as needed. 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   Physical Exam: Filed Weights   11/12/22 1552  Weight: 152 lb 6.4 oz (69.1 kg)   BP (!) 140/80   Pulse 95   Temp 97.9 F (36.6 C)   Ht 5' 1.25" (1.556 m)   Wt 152 lb 6.4 oz (69.1 kg)   SpO2 99%   BMI 28.56 kg/m  General Appearance: Well nourished, in no apparent distress. Eyes: PERRLA, EOMs, conjunctiva no swelling or erythema Sinuses: No Frontal/maxillary tenderness ENT/Mouth: Ext aud canals clear, TMs without erythema, bulging. No erythema, swelling, or exudate on post pharynx.  Tonsils not swollen or erythematous. Hearing normal.  Neck: Supple, thyroid normal.  Respiratory: Respiratory effort normal, BS equal bilaterally without rales, rhonchi, wheezing or stridor.  Cardio: RRR with no MRGs. Brisk peripheral pulses without edema.  Abdomen: Soft, + BS.  Non tender, no guarding, rebound, hernias, masses.  Lymphatics: Non tender without lymphadenopathy.  Musculoskeletal: Full ROM, 5/5 strength, normal gait.  Skin: Warm, dry without rashes, lesions, ecchymosis.  Neuro: Cranial nerves intact. Normal muscle tone, no cerebellar symptoms. Sensation intact.  Psych: Awake and oriented X 3, normal affect, Insight and Judgment appropriate.     Adela Glimpse, NP 4:50 PM [facility]

## 2022-11-12 NOTE — Telephone Encounter (Signed)
PT had OV yesterday and was given a script for diarrhea and she was told it was a controlled substance and she refused to get it because it will eventually affect her heart. She wants to have another option She also has an EM scheduled for 10/16 and needs to change it to a morning appointment because she can just have acid escaping out her stomach all day damaging her teeth. Please advise.

## 2022-11-12 NOTE — Patient Instructions (Signed)
Palpitations Palpitations are feelings that your heartbeat is irregular or is faster than normal. It may feel like your heart is fluttering or skipping a beat. Palpitations may be caused by many things, including smoking, caffeine, alcohol, stress, and certain medicines or drugs. Most causes of palpitations are not serious.  However, some palpitations can be a sign of a serious problem. Further tests and a thorough medical history will be done to find the cause of your palpitations. Your provider may order tests such as an ECG, labs, an echocardiogram, or an ambulatory continuous ECG monitor. Follow these instructions at home: Pay attention to any changes in your symptoms. Let your health care provider know about them. Take these actions to help manage your symptoms: Eating and drinking Follow instructions from your health care provider about eating or drinking restrictions. You may need to avoid foods and drinks that may cause palpitations. These may include: Caffeinated coffee, tea, soft drinks, and energy drinks. Chocolate. Alcohol. Diet pills. Lifestyle     Take steps to reduce your stress and anxiety. Things that can help you relax include: Yoga. Mind-body activities, such as deep breathing, meditation, or using words and images to create positive thoughts (guided imagery). Physical activity, such as swimming, jogging, or walking. Tell your health care provider if your palpitations increase with activity. If you have chest pain or shortness of breath with activity, do not continue the activity until you are seen by your health care provider. Biofeedback. This is a method that helps you learn to use your mind to control things in your body, such as your heartbeat. Get plenty of rest and sleep. Keep a regular bed time. Do not use drugs, including cocaine or ecstasy. Do not use marijuana. Do not use any products that contain nicotine or tobacco. These products include cigarettes, chewing  tobacco, and vaping devices, such as e-cigarettes. If you need help quitting, ask your health care provider. General instructions Take over-the-counter and prescription medicines only as told by your health care provider. Keep all follow-up visits. This is important. These may include visits for further testing if palpitations do not go away or get worse. Contact a health care provider if: You continue to have a fast or irregular heartbeat for a long period of time. You notice that your palpitations occur more often. Get help right away if: You have chest pain or shortness of breath. You have a severe headache. You feel dizzy or you faint. These symptoms may represent a serious problem that is an emergency. Do not wait to see if the symptoms will go away. Get medical help right away. Call your local emergency services (911 in the [place] Do not drive yourself to the hospital. Summary Palpitations are feelings that your heartbeat is irregular or is faster than normal. It may feel like your heart is fluttering or skipping a beat. Palpitations may be caused by many things, including smoking, caffeine, alcohol, stress, certain medicines, and drugs. Further tests and a thorough medical history may be done to find the cause of your palpitations. Get help right away if you faint or have chest pain, shortness of breath, severe headache, or dizziness. This information is not intended to replace advice given to you by your health care provider. Make sure you discuss any questions you have with your health care provider. Document Revised: 08/01/2020 Document Reviewed: 08/01/2020 Elsevier Patient Education  2024 [employer].

## 2022-11-13 NOTE — Telephone Encounter (Signed)
Spoke with patient and discussed the role of viberzi in helping diarrhea/abdominal pain and advised that this medication is theoretically described as a "controlled substance" since it attaches to opioid receptors in the body.  Patient states that the pharmacy tech told her the medication was an amphetamine and she cannot have amphetamines/stimulants due to recent cardiac issues. I have advised patient that this medication in no way, shape or form is an amphetamine. Patient states she will look into the medication and think about taking it.   In addition, patient is advised that we can attempt to have her scheduled for an 8:30 am esophageal manometry, but this would put the test out until at least late December if not later. Patient says she doesn't want to really put the test off that long and would also like to think about keeping the 10/204 appointment. States she will call us back if she decides to move forward with any changes.

## 2022-11-14 DIAGNOSIS — R002 Palpitations: Secondary | ICD-10-CM

## 2022-11-17 ENCOUNTER — Encounter: Payer: Self-pay | Admitting: Nurse Practitioner

## 2022-11-21 ENCOUNTER — Encounter: Payer: Self-pay | Admitting: Gastroenterology

## 2022-11-26 MED ORDER — LANSOPRAZOLE 30 MG PO TBDD: 30.0000 mg | DELAYED_RELEASE_TABLET | Freq: Every day | ORAL | 1 refills | Status: DC

## 2022-11-26 NOTE — Telephone Encounter (Signed)
Patient states that the lansoprazole 15 mg solutab is not effective for her and would like to increase her dosage. She is currently scheduled for esophageal manometry 12/2022 but would like to increase dosage before then as she feels her tooth decay is related to her worsening reflux.   Also, last office note 11/11/22 makes mention that depending on patient response to lansoprazole, we would decide whether to do manometry ON PPI vs OFF PPI.   Please advise.Marland KitchenMarland Kitchen

## 2022-11-27 ENCOUNTER — Encounter: Payer: Self-pay | Admitting: Nurse Practitioner

## 2022-11-27 DIAGNOSIS — I158 Other secondary hypertension: Secondary | ICD-10-CM

## 2022-11-27 DIAGNOSIS — Z79899 Other long term (current) drug therapy: Secondary | ICD-10-CM

## 2022-11-28 MED ORDER — METOPROLOL TARTRATE 25 MG PO TABS
25.0000 mg | ORAL_TABLET | Freq: Every day | ORAL | 0 refills | Status: DC
Start: 2022-11-28 — End: 2022-12-01

## 2022-12-01 MED ORDER — METOPROLOL TARTRATE 50 MG PO TABS
50.0000 mg | ORAL_TABLET | Freq: Every day | ORAL | 0 refills | Status: DC
Start: 2022-12-01 — End: 2023-01-28

## 2022-12-08 ENCOUNTER — Encounter: Payer: Self-pay | Admitting: Gastroenterology

## 2022-12-08 DIAGNOSIS — R002 Palpitations: Secondary | ICD-10-CM | POA: Diagnosis not present

## 2022-12-09 MED ORDER — DIPHENOXYLATE-ATROPINE 2.5-0.025 MG PO TABS: 1.0000 | ORAL_TABLET | Freq: Two times a day (BID) | ORAL | 0 refills | Status: DC | PRN

## 2022-12-16 DIAGNOSIS — M542 Cervicalgia: Secondary | ICD-10-CM | POA: Diagnosis not present

## 2022-12-18 DIAGNOSIS — Z79891 Long term (current) use of opiate analgesic: Secondary | ICD-10-CM | POA: Diagnosis not present

## 2022-12-18 DIAGNOSIS — M47812 Spondylosis without myelopathy or radiculopathy, cervical region: Secondary | ICD-10-CM | POA: Diagnosis not present

## 2022-12-25 ENCOUNTER — Ambulatory Visit (INDEPENDENT_AMBULATORY_CARE_PROVIDER_SITE_OTHER): Payer: BC Managed Care – PPO | Admitting: Nurse Practitioner

## 2022-12-25 ENCOUNTER — Encounter: Payer: Self-pay | Admitting: Nurse Practitioner

## 2022-12-25 ENCOUNTER — Ambulatory Visit: Payer: BC Managed Care – PPO | Admitting: Nurse Practitioner

## 2022-12-25 VITALS — BP 118/80 | HR 77 | Temp 97.9°F | Ht 61.25 in | Wt 156.6 lb

## 2022-12-25 DIAGNOSIS — R002 Palpitations: Secondary | ICD-10-CM | POA: Diagnosis not present

## 2022-12-25 DIAGNOSIS — K582 Mixed irritable bowel syndrome: Secondary | ICD-10-CM

## 2022-12-25 NOTE — Progress Notes (Signed)
Follow Up  Assessment and Plan:  Diagnoses and all orders for this visit:  IBS GI following Considering esophageal manometry  GERD Continue medications Eliminate food triggers GI following  History adenomatous colon polyps Next colonoscopy due 07/2022 Considering EGD Following closely with GI  Palpitations Cardiac holter monitor overall reassuring Occasional SVT Continue beta blocker Stay well hydrated.   Medication management All medications discussed and reviewed in full. All questions and concerns regarding medications addressed.     Notify office for further evaluation and treatment, questions or concerns if any reported s/s fail to improve.   The patient was advised to call back or seek an in-person evaluation if any symptoms worsen or if the condition fails to improve as anticipated.   Further disposition pending results of labs. Discussed med's effects and SE's.    I discussed the assessment and treatment plan with the patient. The patient was provided an opportunity to ask questions and all were answered. The patient agreed with the plan and demonstrated an understanding of the instructions.  Discussed med's effects and SE's. Screening labs and tests as requested with regular follow-up as recommended.  I provided 30 minutes of face-to-face time during this encounter including counseling, chart review, and critical decision making was preformed.  Today's Plan of Care is based on a patient-centered health care approach known as shared decision making - the decisions, tests and treatments allow for patient preferences and values to be balanced with clinical evidence.     Future Appointments  Date Time Provider Department Center  01/28/2023  8:00 AM Quintella Reichert, MD [facility]  02/26/2023  8:20 AM Cirigliano, Verlin Dike, DO [facility]  06/09/2023  3:00 PM Adela Glimpse, NP [facility] None    HPI  47 y.o. female  presents for a follow up  for has Schizophreniform disorder (HCC); Hypothyroid; Vitamin D deficiency; BMI 28.0-28.9,adult; Insomnia; Hyperlipidemia; Major depression in remission (HCC); Neck pain; Sciatica of right side; Hyperreflexia; Mid back pain; Family history of MS (multiple sclerosis); Pain of right lower extremity; Right arm pain; Anxiety; History of adenomatous polyp of colon; and Cervical spinal stenosis on their problem list.   Originally concerned for GI related candida. Was treated with Diflucan oral for 4 weeks with improvement.  Was  unable to tolerate heavy foods, eats light meals, some associated nausea and bloating.  Denied blood in stool.     She continues to have ongoing GI complications including uncontrolled IBS,  mostly diarrhea at this point accompanied by severe acid reflux.  She follows with GI, recently started with Viberzi and Lomitil.  She reports medication compliance she feels as though these medications are not helping.  She is scheduled to have an esophageal manometry but feels she may push this back however, she is considering an updated EGD at the first of the year.  She continues to help control the symptoms thorough lifestyle modifications, monitoring diet.  She was also reporting palpitations after taking certain medications and when lying down at night.  She completed a cardiac holter for monitor for 14 days which revealed symptomatic SVT.  Currently controlled on Metoprolol    Her blood pressure has been controlled at home (110/70), today their BP is BP: 118/80 She does workout. She denies chest pain, shortness of breath, dizziness.   She is not on cholesterol medication. Her cholesterol is not at goal. The cholesterol last visit was:      Current Medications:  Current Outpatient Medications on File Prior to Visit  Medication Sig Dispense Refill   acetaminophen (TYLENOL) 500 MG tablet Take 500 mg by mouth every 6 (six) hours as needed. Takes 0-6 per day depending on her pain level      aluminum-magnesium hydroxide-simethicone (MAALOX) 200-200-20 MG/5ML SUSP Take 20 mLs by mouth 3 (three) times daily.     amitriptyline (ELAVIL) 50 MG tablet Take 1 tablet (50 mg total) by mouth at bedtime. 30 tablet 3   Bismuth Subsalicylate (PEPTO-BISMOL PO) Take by mouth as needed.     Ca Carbonate-Mag Hydroxide (ROLAIDS PO) Take 1 each by mouth as needed.     diphenoxylate-atropine (LOMOTIL) 2.5-0.025 MG tablet Take 1 tablet by mouth every 12 (twelve) hours as needed for diarrhea or loose stools. 30 tablet 0   Eluxadoline (VIBERZI) 100 MG TABS Take by mouth in the morning and at bedtime.     gabapentin (NEURONTIN) 400 MG capsule TAKE 2 CAPSULES(800 MG) BY MOUTH FOUR TIMES DAILY AS NEEDED 240 capsule 3   Glucosamine-Chondroitin 1500-1200 MG/30ML LIQD Take by mouth.     Ibuprofen 200 MG CAPS Take by mouth daily.     lactobacillus acidophilus & bulgar (LACTINEX) chewable tablet Chew 1 tablet by mouth 3 (three) times daily with meals. 90 tablet 1   lansoprazole (PREVACID SOLUTAB) 30 MG disintegrating tablet Take 1 tablet (30 mg total) by mouth daily at 12 noon. 30 tablet 1   levothyroxine (SYNTHROID) 75 MCG tablet TAKE 1 TABLET DAILY ON EMPTY STOMACH WITH ONLY WATER FOR 30 MINUTES & NO ANTACID MEDS, CALCIUM OR MAGNESIUM FOR 4 HOURS AND AVOID BIOTIN 90 tablet 3   LO LOESTRIN FE 1 MG-10 MCG / 10 MCG tablet Take 1 tablet by mouth daily.     MAGNESIUM PO Take 400 mg by mouth daily.     methocarbamol (ROBAXIN-750) 750 MG tablet Take 1 tablet (750 mg total) by mouth every 8 (eight) hours as needed for muscle spasms. 60 tablet 1   metoprolol tartrate (LOPRESSOR) 50 MG tablet Take 1 tablet (50 mg total) by mouth daily. 90 tablet 0   Multiple Vitamins-Minerals (MULTIVITAMIN ADULTS PO) Take 1 tablet by mouth daily.     oxyCODONE-acetaminophen (PERCOCET/ROXICET) 5-325 MG tablet Take 1 tablet by mouth every 4 (four) hours as needed.     promethazine (PHENERGAN) 25 MG tablet TAKE 1 TABLET BY MOUTH EVERY 6  HOURS AS NEEDED FOR NAUSEA OR HEADACHE 30 tablet 0   sucralfate (CARAFATE) 1 g tablet Take 1 tablet (1 g total) by mouth 4 (four) times daily -  with meals and at bedtime. 360 tablet 0   TURMERIC PO Take by mouth.     VITAMIN D PO Take 10,000 Units by mouth daily.     Zinc 50 MG TABS Take 1 tablet Daily  0   AMBULATORY NON FORMULARY MEDICATION 1 Scoop daily. Medication Name: Clear Vite-PSF (k-84) Enzyme based, multivitamin, mineral & herbal dietary supplement powder (Patient not taking: Reported on 12/25/2022)     AMBULATORY NON FORMULARY MEDICATION 1 tablet at bedtime. Medication Name: Domingo Dimes dietary supplement with proteolytiic enzymes for the gut (Patient not taking: Reported on 12/25/2022)     AMBULATORY NON FORMULARY MEDICATION 2 tablets daily in the afternoon. Medication Name: Drenamin dietary supplement 3700 for adrenal support (Patient not taking: Reported on 12/25/2022)     AMBULATORY NON FORMULARY MEDICATION 3 tablets daily in the afternoon. Medication Name: A-F betafood dietary supplement 0825 for gallbladder (Patient not taking: Reported on 12/25/2022)     cyclobenzaprine (FLEXERIL) 5 MG  tablet 1 tablet as needed Orally Three times a day (Patient not taking: Reported on 12/25/2022)     dicyclomine (BENTYL) 10 MG capsule Take 1 capsule (10 mg total) by mouth 4 (four) times daily -  before meals and at bedtime. (Patient not taking: Reported on 11/12/2022) 120 capsule 2   DULoxetine (CYMBALTA) 30 MG capsule 1 capsule Orally Once at night x 2 weeks, then two capsule at night if no side effects for 30 days (Patient not taking: Reported on 12/25/2022)     hydrOXYzine (ATARAX) 50 MG tablet Take 1-2 tablets (50-100 mg total) by mouth every 6 (six) hours as needed for anxiety. (Patient not taking: Reported on 12/25/2022) 120 tablet 0   losartan (COZAAR) 25 MG tablet TAKE 1 TABLET(25 MG) BY MOUTH DAILY (Patient not taking: Reported on 11/12/2022) 90 tablet 2   temazepam (RESTORIL) 30 MG capsule TAKE  1 CAPSULE BY MOUTH EVERY NIGHT AT BEDTIME AS NEEDED FOR SLEEP (Patient not taking: Reported on 11/11/2022) 30 capsule 0   Wheat Dextrin (BENEFIBER PO) Take by mouth. (Patient not taking: Reported on 12/25/2022)     No current facility-administered medications on file prior to visit.   Allergies:  Allergies  Allergen Reactions   Hydrocodone Itching   Oxycodone-Acetaminophen    Penicillins Swelling   Medical History:  She has Schizophreniform disorder (HCC); Hypothyroid; Vitamin D deficiency; BMI 28.0-28.9,adult; Insomnia; Hyperlipidemia; Major depression in remission (HCC); Neck pain; Sciatica of right side; Hyperreflexia; Mid back pain; Family history of MS (multiple sclerosis); Pain of right lower extremity; Right arm pain; Anxiety; History of adenomatous polyp of colon; and Cervical spinal stenosis on their problem list. Health Maintenance:   Immunization History  Administered Date(s) Administered   Influenza,inj,quad, With Preservative 01/17/2016   Tdap 05/18/2020   Health Maintenance  Topic Date Due   COVID-19 Vaccine (1) Never done   Cervical Cancer Screening (HPV/Pap Cotest)  07/30/2018   INFLUENZA VACCINE  10/23/2022   Colonoscopy  05/02/2023   DTaP/Tdap/Td (2 - Td or Tdap) 05/18/2030   HPV VACCINES  Aged Out   Hepatitis C Screening  Discontinued   HIV Screening  Discontinued     Flu vaccine: 2017, declines  LMP: No LMP recorded. Patient is perimenopausal. Pap:    By GYN - annually  MGM:  By GYN - annually   Head CT: 04/18/2016  Last Dental Exam: Dr. Collene Mares, 2023- goes q6 Last Eye Exam: Annually, last 2023, planning 2 year follow up, glasses  Patient Care Team: Lucky Cowboy, MD as PCP - General (Internal Medicine)  Surgical History:  She has a past surgical history that includes Tonsillectomy and adenoidectomy (1981); Knee arthroscopy (Left, 1988); Knee arthroscopy (Right, 1999); Ankle arthroscopy (Right, 2000); laparoscopy (2001); Nasal polyp surgery  (2006); Colonoscopy (05/01/2020); and Upper gastrointestinal endoscopy (05/01/2020). Family History:  Herfamily history includes Alcohol abuse in her brother, father, and mother; Clotting disorder in her mother; Drug abuse in her brother, father, and mother; Heart disease in her brother and paternal grandfather; Kidney disease in her maternal grandfather; Other in her mother; Stroke in her paternal grandfather; Thyroid cancer in her maternal grandmother. Social History:  She reports that she has never smoked. She has never used smokeless tobacco. She reports that she does not currently use alcohol. She reports that she does not use drugs.  Review of Systems: Review of Systems  Constitutional:  Negative for malaise/fatigue and weight loss.  HENT:  Negative for hearing loss and tinnitus.   Eyes:  Negative  for blurred vision and double vision.  Respiratory:  Negative for cough, shortness of breath and wheezing.   Cardiovascular:  Negative for chest pain, palpitations, orthopnea, claudication and leg swelling.  Gastrointestinal:  Negative for abdominal pain, blood in stool, constipation, diarrhea, heartburn (improving), melena, nausea and vomiting.       Bloating  Genitourinary: Negative.   Musculoskeletal:  Positive for neck pain. Negative for back pain, joint pain and myalgias.  Skin:  Negative for rash.  Neurological:  Negative for dizziness, tingling, sensory change, weakness and headaches (improved).  Endo/Heme/Allergies:  Negative for environmental allergies and polydipsia.  Psychiatric/Behavioral:  Negative for depression, hallucinations, memory loss, substance abuse and suicidal ideas. The patient is nervous/anxious. The patient does not have insomnia.   All other systems reviewed and are negative.   Physical Exam: Estimated body mass index is 29.35 kg/m as calculated from the following:   Height as of this encounter: 5' 1.25" (1.556 m).   Weight as of this encounter: 156 lb 9.6 oz  (71 kg). BP 118/80   Pulse 77   Temp 97.9 F (36.6 C)   Ht 5' 1.25" (1.556 m)   Wt 156 lb 9.6 oz (71 kg)   SpO2 99%   BMI 29.35 kg/m  General Appearance: Well nourished, in no apparent distress.  Eyes: PERRLA, EOMs, conjunctiva no swelling or erythema Sinuses: No Frontal/maxillary tenderness  ENT/Mouth: Ext aud canals clear, normal light reflex with TMs without erythema, bulging. Good dental hygiene, oral MM intact. Hearing normal.  Neck: Supple, thyroid normal. No bruits  Respiratory: Respiratory effort normal, BS equal bilaterally without rales, rhonchi, wheezing or stridor.  Cardio: RRR without murmurs, rubs or gallops. Brisk peripheral pulses without edema.  Chest: symmetric, with normal excursions and percussion.  Breasts: Declines, defer to GYN Abdomen: Soft, nontender, no guarding, rebound, hernias, masses, or organomegaly.  Lymphatics: Non tender without lymphadenopathy.  Genitourinary: Defer to GYN Musculoskeletal: Full ROM all peripheral extremities,5/5 strength, and normal gait.  Skin: Warm, dry without rashes, lesions, ecchymosis. Neuro: Cranial nerves intact, reflexes equal bilaterally. Normal muscle tone, no cerebellar symptoms. Sensation intact.  Psych: Awake and oriented X 3, normal affect, Insight and Judgment fair.   EKG: WNL baseline in chart, defer  Charlotte Wise 12:56 PM [facility]

## 2022-12-25 NOTE — Patient Instructions (Signed)
Food Choices for Gastroesophageal Reflux Disease, Adult When you have gastroesophageal reflux disease (GERD), the foods you eat and your eating habits are very important. Choosing the right foods can help ease the discomfort of GERD. Consider working with a dietitian to help you make healthy food choices. What are tips for following this plan? Reading food labels Look for foods that are low in saturated fat. Foods that have less than 5% of daily value (DV) of fat and 0 g of trans fats may help with your symptoms. Cooking Cook foods using methods other than frying. This may include baking, steaming, grilling, or broiling. These are all methods that do not need a lot of fat for cooking. To add flavor, try to use herbs that are low in spice and acidity. Meal planning  Choose healthy foods that are low in fat, such as fruits, vegetables, whole grains, low-fat dairy products, lean meats, fish, and poultry. Eat frequent, small meals instead of three large meals each day. Eat your meals slowly, in a relaxed setting. Avoid bending over or lying down until 2-3 hours after eating. Limit high-fat foods such as fatty meats or fried foods. Limit your intake of fatty foods, such as oils, butter, and shortening. Avoid the following as told by your health care provider: Foods that cause symptoms. These may be different for different people. Keep a food diary to keep track of foods that cause symptoms. Alcohol. Drinking large amounts of liquid with meals. Eating meals during the 2-3 hours before bed. Lifestyle Maintain a healthy weight. Ask your health care provider what weight is healthy for you. If you need to lose weight, work with your health care provider to do so safely. Exercise for at least 30 minutes on 5 or more days each week, or as told by your health care provider. Avoid wearing clothes that fit tightly around your waist and chest. Do not use any products that contain nicotine or tobacco. These  products include cigarettes, chewing tobacco, and vaping devices, such as e-cigarettes. If you need help quitting, ask your health care provider. Sleep with the head of your bed raised. Use a wedge under the mattress or blocks under the bed frame to raise the head of the bed. Chew sugar-free gum after mealtimes. What foods should I eat?  Eat a healthy, well-balanced diet of fruits, vegetables, whole grains, low-fat dairy products, lean meats, fish, and poultry. Each person is different. Foods that may trigger symptoms in one person may not trigger any symptoms in another person. Work with your health care provider to identify foods that are safe for you. The items listed above may not be a complete list of recommended foods and beverages. Contact a dietitian for more information. What foods should I avoid? Limiting some of these foods may help manage the symptoms of GERD. Everyone is different. Consult a dietitian or your health care provider to help you identify the exact foods to avoid, if any. Fruits Any fruits prepared with added fat. Any fruits that cause symptoms. For some people this may include citrus fruits, such as oranges, grapefruit, pineapple, and lemons. Vegetables Deep-fried vegetables. French fries. Any vegetables prepared with added fat. Any vegetables that cause symptoms. For some people, this may include tomatoes and tomato products, chili peppers, onions and garlic, and horseradish. Grains Pastries or quick breads with added fat. Meats and other proteins High-fat meats, such as fatty beef or pork, hot dogs, ribs, ham, sausage, salami, and bacon. Fried meat or protein, including   fried fish and fried chicken. Nuts and nut butters, in large amounts. Dairy Whole milk and chocolate milk. Sour cream. Cream. Ice cream. Cream cheese. Milkshakes. Fats and oils Butter. Margarine. Shortening. Ghee. Beverages Coffee and tea, with or without caffeine. Carbonated beverages. Sodas. Energy  drinks. Fruit juice made with acidic fruits, such as orange or grapefruit. Tomato juice. Alcoholic drinks. Sweets and desserts Chocolate and cocoa. Donuts. Seasonings and condiments Pepper. Peppermint and spearmint. Added salt. Any condiments, herbs, or seasonings that cause symptoms. For some people, this may include curry, hot sauce, or vinegar-based salad dressings. The items listed above may not be a complete list of foods and beverages to avoid. Contact a dietitian for more information. Questions to ask your health care provider Diet and lifestyle changes are usually the first steps that are taken to manage symptoms of GERD. If diet and lifestyle changes do not improve your symptoms, talk with your health care provider about taking medicines. Where to find more information [employer] G[employer]intestinal [employer]: aboutgerd.org Summary When you have gastroesophageal reflux disease (GERD), food and lifestyle choices may be very helpful in easing the discomfort of GERD. Eat frequent, small meals instead of three large meals each day. Eat your meals slowly, in a relaxed setting. Avoid bending over or lying down until 2-3 hours after eating. Limit high-fat foods such as fatty meats or fried foods. This information is not intended to replace advice given to you by your health care provider. Make sure you discuss any questions you have with your health care provider. Document Revised: 09/19/2019 Document Reviewed: 09/19/2019 Elsevier Patient Education  2024 [employer] Inc.  

## 2023-01-02 ENCOUNTER — Telehealth: Payer: Self-pay | Admitting: Gastroenterology

## 2023-01-02 NOTE — Telephone Encounter (Signed)
PT wants to cancel her EM and would like to try and reschedule next week for February.

## 2023-01-02 NOTE — Telephone Encounter (Signed)
Left message for patient to call back. I need to confirm that she wants me to cancel her 10/16 esophageal manometry at this time.

## 2023-01-02 NOTE — Telephone Encounter (Signed)
Patient's esophageal manometry for 01/07/23 has been rescheduled to 05/06/23 at 8:30 am.  I have spoken to patient to advise of this and she verbalizes understanding.  In addition, patient states that she has been taking viberzi 100 mg twice daily as prescribed as well as lomotil twice daily PRN and amitriptyline 50 mg but continues with diarrhea. She states that it is "often as if I have taken nothing for the diarrhea." When asked, patient says that she takes the lomotil infrequently because she got a small supply, but she has tried twice daily dosing and doesn't feel it is very helpful.   Paitent says she was given a FODMAP diet to follow but has not really followed this as of late and knows she needs to be more serious about it.   Patient is asked to begin following FODMAP more closely and keep a food diary to see if any particular foods trigger her symptoms.  Dr Barron Alvine, would you be ok providing more lomotil? Other thoughts?

## 2023-01-02 NOTE — Telephone Encounter (Signed)
Inbound call from patient, states procedure on 10/16 at [facility] would be too costly for her. Patient states she would like to wait until the next calendar year to reschedule.   Thank you.

## 2023-01-05 MED ORDER — DIPHENOXYLATE-ATROPINE 2.5-0.025 MG PO TABS: 1.0000 | ORAL_TABLET | Freq: Two times a day (BID) | ORAL | 1 refills | Status: DC | PRN

## 2023-01-05 NOTE — Telephone Encounter (Signed)
Patient is advised that Dr Barron Alvine will provide a higher quantity of lomotil so she can take this every 12 hours as needed. Rx sent to pharmacy. Patient verbalizes understanding.

## 2023-01-14 DIAGNOSIS — M47812 Spondylosis without myelopathy or radiculopathy, cervical region: Secondary | ICD-10-CM | POA: Diagnosis not present

## 2023-01-14 DIAGNOSIS — Z79891 Long term (current) use of opiate analgesic: Secondary | ICD-10-CM | POA: Diagnosis not present

## 2023-01-26 DIAGNOSIS — M47812 Spondylosis without myelopathy or radiculopathy, cervical region: Secondary | ICD-10-CM | POA: Diagnosis not present

## 2023-01-27 NOTE — Progress Notes (Unsigned)
Cardiology CONSULT Note    Date:  01/28/2023   ID:  Charlotte Wise, DOB 03-16-1976, MRN [id number]  PCP:  Adela Glimpse, NP  Cardiologist:  Armanda Magic, MD   Chief Complaint  Patient presents with   New Patient (Initial Visit)    Palpitations, SVT    Patient Profile: Charlotte Wise is a 47 y.o. female who is being seen today for the evaluation of palpitations at the request of Jeanelle Malling, [place].  History of Present Illness:  Charlotte Wise is a 47 y.o. female who is being seen today for the evaluation of palpitations at the request of Jeanelle Malling, [place].  This is a 47yo female with a hx of HLD and thyroid disease who presents for evaluation of palpitations and fatigue.   She tells me that she also has had fatigue and would have to take 3 caffeine pills to get out of bed.  She saw her PCP who recommended Phenteramine which she started taking.  3 days later she started having bad palpitations. She started feeling poorly one evening after taking it in the am. Her heart rate was fast and around 9:30pm that night her heart was racing very fast and she took her BP and it was 190/122mmHg and HR was 126bpm. She could not get to sleep.  She says she did not really think it was from anxiety.  She was up all night with it but did not call the ambulance.  By 8am she was still feeling poorly and went to [facility] ER .  Apparently she was taking a "liver detox" med as well that she got at [employer].  She was dx with anxiety and given  antianxiety meds.  She then was her PCP who ordered a heart monitor showing episodes of SVT as fast as 138bpm but most of her sx correlated with NSR and not episodes of SVT.  Currently on Lopressor 50mg  daily.  She says that now she feels her racing less and less and the metoprolol has helped.  She denies any chest pain or SOB, PND, orthopnea, LE edema, dizziness or syncope.   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 Takes 0-6 per day depending on her pain level   aluminum-magnesium hydroxide-simethicone (MAALOX) 200-200-20 MG/5ML SUSP Take 20 mLs by mouth 3 (three) times daily.   amitriptyline (ELAVIL) 50 MG tablet Take 1 tablet (50 mg total) by mouth at bedtime.   Bismuth Subsalicylate (PEPTO-BISMOL PO) Take by mouth as needed.   Ca Carbonate-Mag Hydroxide (ROLAIDS PO) Take 1 each by mouth as needed.   Ibuprofen 200 MG CAPS Take by mouth daily.   lactobacillus acidophilus & bulgar (  LACTINEX) chewable tablet Chew 1 tablet by mouth 3 (three) times daily with meals.   levothyroxine (SYNTHROID) 75 MCG tablet TAKE 1 TABLET DAILY ON EMPTY STOMACH WITH ONLY WATER FOR 30 MINUTES & NO ANTACID MEDS, CALCIUM OR MAGNESIUM FOR 4 HOURS AND AVOID BIOTIN   LO LOESTRIN FE 1 MG-10 MCG / 10 MCG tablet Take 1 tablet by mouth daily.   methocarbamol (ROBAXIN-750) 750 MG tablet Take 1 tablet (750 mg total) by mouth every 8 (eight) hours as needed for muscle spasms.   metoprolol tartrate (LOPRESSOR) 50 MG tablet Take 1 tablet (50 mg total) by mouth daily.   oxyCODONE-acetaminophen (PERCOCET/ROXICET) 5-325 MG tablet Take 1 tablet by mouth every 4 (four) hours as needed.   promethazine (PHENERGAN) 25 MG tablet TAKE 1 TABLET BY MOUTH EVERY 6 HOURS AS NEEDED FOR NAUSEA  OR HEADACHE   VITAMIN D PO Take 10,000 Units by mouth daily.   Zinc 50 MG TABS Take 1 tablet Daily    Allergies:   Hydrocodone, Oxycodone-acetaminophen, and Penicillins   Social History   Socioeconomic History   Marital status: Married    Spouse name: Bradly Chris   Number of children: 1   Years of education: BS   Highest education level: Not on file  Occupational History   Occupation: not currently working   Tobacco Use   Smoking status: Never   Smokeless tobacco: Never   Tobacco comments:    Tried a cigarette once  Vaping Use   Vaping status: Never Used  Substance and Sexual Activity   Alcohol use: Not Currently    Alcohol/week: 0.0 standard drinks of alcohol    Comment: previously occasional socially    Drug use: No   Sexual activity: Yes    Partners: Male    Birth control/protection: I.U.D.  Other Topics Concern   Not on file  Social History Narrative   Lives with husband and son   Caffeine use: No soda, a few cups of coffee or tea per month   Right handed    Social Determinants of Health   Financial Resource Strain: Not on file  Food Insecurity: Not on file  Transportation Needs: Not on file  Physical Activity: Inactive (05/11/2018)   Exercise Vital Sign    Days of Exercise per Week: 0 days    Minutes of Exercise per Session: 0 min  Stress: No Stress Concern Present (05/11/2018)   [employer] of Occupational Health - Occupational Stress Questionnaire    Feeling of Stress : Only a little  Social Connections: Not on file     Family History:  The patient's family history includes Alcohol abuse in her brother, father, and mother; Clotting disorder in her mother; Drug abuse in her brother, father, and mother; Heart disease in her brother and paternal grandfather; Kidney disease in her maternal grandfather; Other in her mother; Stroke in her paternal grandfather; Thyroid cancer in her maternal grandmother.   ROS:   Please see the history of present illness.    ROS  All other systems reviewed and are negative.      No data to display             PHYSICAL EXAM:   VS:  BP 100/70   Pulse 84   Ht 5\' 1"  (1.549 m)   Wt 156 lb 12.8 oz (71.1 kg)   SpO2 98%   BMI 29.63 kg/m    GEN: Well nourished, well developed, in no acute distress  HEENT: normal  Neck: no  JVD, carotid bruits, or masses Cardiac: RRR; no murmurs, rubs, or gallops,no edema.  Intact distal pulses bilaterally.  Respiratory:  clear to auscultation bilaterally, normal work of breathing GI: soft, nontender, nondistended, + BS MS: no deformity or atrophy  Skin: warm and dry, no rash Neuro:  Alert and Oriented x 3, Strength and sensation are intact Psych: euthymic mood, full affect  Wt Readings from Last 3 Encounters:  01/28/23 156 lb 12.8 oz (71.1 kg)  12/25/22 156 lb 9.6 oz (71 kg)  11/12/22 152 lb 6.4 oz (69.1 kg)      Studies/Labs Reviewed:       Cardiac Studies & Procedures         MONITORS  LONG TERM MONITOR (3-14 DAYS) 12/08/2022  Narrative   Patient had a minimum heart rate of 47 bpm, maximum heart rate of 138 bpm, and average heart rate of 67 bpm.   Predominant underlying rhythm was sinus rhythm.   Several short runs of paroxysmal supraventricular tachycardia, longest was 14 beats.  Fastest was 138 bpm.   Isolated PACs were rare (<1.0%).   Isolated PVCs were occasional (1.4%).   Triggered and diary events associated with sinus rhythm, rarely SVT.  Majority of events associated with sinus rhythm.  Rarely may have symptomatic SVT.           Recent Labs: 06/09/2022: Magnesium 2.2 09/15/2022: TSH 1.99 11/08/2022: ALT 18; BUN <5; Creatinine, Ser 0.79; Hemoglobin 12.1; Platelets 390; Potassium 3.3; Sodium 136   Lipid Panel    Component Value Date/Time   CHOL 175 09/15/2022 1434   TRIG 118 09/15/2022 1434   HDL 65 09/15/2022 1434   CHOLHDL 2.7 09/15/2022 1434   VLDL 26 10/09/2016 1604   LDLCALC 89 09/15/2022 1434    Additional studies/ records that were  reviewed today include:  none    ASSESSMENT:    1. Palpitations   2. SVT (supraventricular tachycardia) (HCC)      PLAN:  In order of problems listed above:  #Palpitations/SVT -noted to have SVT on heart monitor but sx did not really correlate with her episodes of SVT -already on BB but on Lopressor tartrate 50mg  daily instead of BID -change to Toprol XL 25mg  daily -check 2D echo to assess LVF and LA size -TSH was normal in June -check BMET today -I stressed the importance of avoiding caffeine and Phenteramine  # Excessive daytime sleepiness -I suspect that she has OSA and recommend a HST>>she wants to wait on this -TSH was normal in June and Hbg normal in August   Time Spent: 20 minutes total time of encounter, including 15 minutes spent in face-to-face patient care on the date of this encounter. This time includes coordination of care and counseling regarding above mentioned problem list. Remainder of non-face-to-face time involved reviewing chart documents/testing relevant to the patient encounter and documentation in the medical record. I have independently reviewed documentation from referring provider  Followup:  4 weeks with PA and 1 year with TT  Medication Adjustments/Labs and Tests Ordered: Current medicines are reviewed at length with the patient today.  Concerns regarding medicines are outlined above.  Medication changes, Labs and Tests ordered today are listed in the Patient Instructions below.  There are no Patient Instructions on file for this visit.   Signed, Armanda Magic, MD  01/28/2023 8:54 AM    [facility] [address] [place], [place], [place]  [address] Phone: [phone]; Fax: [phone]

## 2023-01-28 ENCOUNTER — Ambulatory Visit: Payer: BC Managed Care – PPO | Attending: Cardiology | Admitting: Cardiology

## 2023-01-28 ENCOUNTER — Encounter: Payer: Self-pay | Admitting: Cardiology

## 2023-01-28 VITALS — BP 100/70 | HR 84 | Ht 61.0 in | Wt 156.8 lb

## 2023-01-28 DIAGNOSIS — R002 Palpitations: Secondary | ICD-10-CM | POA: Diagnosis not present

## 2023-01-28 DIAGNOSIS — I471 Supraventricular tachycardia, unspecified: Secondary | ICD-10-CM

## 2023-01-28 DIAGNOSIS — Z79899 Other long term (current) drug therapy: Secondary | ICD-10-CM

## 2023-01-28 MED ORDER — METOPROLOL SUCCINATE ER 25 MG PO TB24
25.0000 mg | ORAL_TABLET | Freq: Every day | ORAL | 3 refills | Status: DC
Start: 2023-01-28 — End: 2023-05-08

## 2023-01-28 NOTE — Addendum Note (Signed)
Addended by: Luellen Pucker on: 01/28/2023 09:02 AM   Modules accepted: Orders

## 2023-01-28 NOTE — Patient Instructions (Signed)
Medication Instructions:  Please STOP taking lopressor.  Please START taking 25 mg Toprol daily.  *If you need a refill on your cardiac medications before your next appointment, please call your pharmacy*   Lab Work: Please complete a BMET in our lab before you leave today.  If you have labs (blood work) drawn today and your tests are completely normal, you will receive your results only by: MyChart Message (if you have MyChart) OR A paper copy in the mail If you have any lab test that is abnormal or we need to change your treatment, we will call you to review the results.   Testing/Procedures: Your physician has requested that you have an echocardiogram. Echocardiography is a painless test that uses sound waves to create images of your heart. It provides your doctor with information about the size and shape of your heart and how well your heart's chambers and valves are working. This procedure takes approximately one hour. There are no restrictions for this procedure. Please do NOT wear cologne, perfume, aftershave, or lotions (deodorant is allowed). Please arrive 15 minutes prior to your appointment time.  Please note: We ask at that you not bring children with you during ultrasound (echo/ vascular) testing. Due to room size and safety concerns, children are not allowed in the ultrasound rooms during exams. Our front office staff cannot provide observation of children in our lobby area while testing is being conducted. An adult accompanying a patient to their appointment will only be allowed in the ultrasound room at the discretion of the ultrasound technician under special circumstances. We apologize for any inconvenience.     Follow-Up: At [facility], you and your health needs are our priority.  As part of our continuing mission to provide you with exceptional heart care, we have created designated Provider Care Teams.  These Care Teams include your primary Cardiologist  (physician) and Advanced Practice Providers (APPs -  Physician Assistants and Nurse Practitioners) who all work together to provide you with the care you need, when you need it.  We recommend signing up for the patient portal called "MyChart".  Sign up information is provided on this After Visit Summary.  MyChart is used to connect with patients for Virtual Visits (Telemedicine).  Patients are able to view lab/test results, encounter notes, upcoming appointments, etc.  Non-urgent messages can be sent to your provider as well.   To learn more about what you can do with MyChart, go to [email or web address].    Your next appointment:   1 month(s)  Provider:   Jari Favre, PA-C, Robin Searing, NP, Eligha Bridegroom, NP, or Tereso Newcomer, PA-C     Then, Dr. Armanda Magic, MD will plan to see you again in 1 year(s).    Other Instructions Our office will look into what the patient cost of a home sleep study is. If you do not hear anything in one week, please give our office a call.

## 2023-01-29 ENCOUNTER — Telehealth: Payer: Self-pay

## 2023-01-29 LAB — BASIC METABOLIC PANEL
BUN/Creatinine Ratio: 7 — ABNORMAL LOW (ref 9–23)
BUN: 6 mg/dL (ref 6–24)
CO2: 26 mmol/L (ref 20–29)
Calcium: 8.9 mg/dL (ref 8.7–10.2)
Chloride: 104 mmol/L (ref 96–106)
Creatinine, Ser: 0.81 mg/dL (ref 0.57–1.00)
Glucose: 73 mg/dL (ref 70–99)
Potassium: 5.2 mmol/L (ref 3.5–5.2)
Sodium: 140 mmol/L (ref 134–144)
eGFR: 89 mL/min/{1.73_m2} (ref >59)

## 2023-01-29 NOTE — Telephone Encounter (Signed)
Call to patient to advise that labs were normal and to continue current medications. Patient verbalizes understanding.

## 2023-01-29 NOTE — Telephone Encounter (Signed)
-----   Message from Armanda Magic sent at 01/29/2023  8:12 AM EST ----- Please let patient know that labs were normal.  Continue current medical therapy.

## 2023-02-03 DIAGNOSIS — G959 Disease of spinal cord, unspecified: Secondary | ICD-10-CM | POA: Diagnosis not present

## 2023-02-09 ENCOUNTER — Other Ambulatory Visit: Payer: Self-pay | Admitting: Nurse Practitioner

## 2023-02-09 DIAGNOSIS — E039 Hypothyroidism, unspecified: Secondary | ICD-10-CM

## 2023-02-11 ENCOUNTER — Telehealth: Payer: Self-pay

## 2023-02-11 DIAGNOSIS — M9904 Segmental and somatic dysfunction of sacral region: Secondary | ICD-10-CM | POA: Diagnosis not present

## 2023-02-11 DIAGNOSIS — G4719 Other hypersomnia: Secondary | ICD-10-CM

## 2023-02-11 DIAGNOSIS — M7918 Myalgia, other site: Secondary | ICD-10-CM | POA: Diagnosis not present

## 2023-02-11 DIAGNOSIS — M4322 Fusion of spine, cervical region: Secondary | ICD-10-CM | POA: Diagnosis not present

## 2023-02-11 DIAGNOSIS — M9905 Segmental and somatic dysfunction of pelvic region: Secondary | ICD-10-CM | POA: Diagnosis not present

## 2023-02-11 DIAGNOSIS — M25651 Stiffness of right hip, not elsewhere classified: Secondary | ICD-10-CM | POA: Diagnosis not present

## 2023-02-11 DIAGNOSIS — M9903 Segmental and somatic dysfunction of lumbar region: Secondary | ICD-10-CM | POA: Diagnosis not present

## 2023-02-11 NOTE — Telephone Encounter (Signed)
Call to patient to ask if she wants to proceed with home sleep study Dr. Mayford Knife discussed with her at office visit on 01/28/23. Patient states she would like to know patient cost for this test, verbalizes understanding that order will have to be placed to get that information. Patient agrees to plan.     Patient reports she does snore, she is sleepy during the day, she is being treated for high blood pressure and she thinks she stops breathing during her sleep for a stop bang score of 4.

## 2023-02-11 NOTE — Telephone Encounter (Signed)
-----   Message from Linward Natal sent at 01/30/2023  8:04 AM EST ----- Regarding: RE: Patient cost for echo I sent her estimate via My Chart.  The majority of the allowed will go to her deductible (estimate ~ $2200).  She can set up a payment plan.  Bjorn Loser ----- Message ----- From: Luellen Pucker, RN Sent: 01/29/2023   4:32 PM EST To: Clide Dales Subject: RE: Patient cost for echo                      Steve Rattler, I talked to the patient and she willing to schedule for December. She is now set for 03/09/23. Could you verify her benefits and let us know what her estimated cost would be?  Thanks, Alcario Drought, RN ----- Message ----- From: Clide Dales Sent: 01/28/2023  10:56 AM EST To: Luellen Pucker, RN Subject: RE: Patient cost for echo                      It has been scheduled at [facility] in January 2025.  That's too far in the future to do an estimate, especially with the change in the year. [employer] could change.  Bjorn Loser ----- Message ----- From: Luellen Pucker, RN Sent: 01/28/2023  10:43 AM EST To: Clide Dales Subject: RE: Patient cost for echo                      Let's assume [employer] for now [place] ----- Message ----- From: Clide Dales Sent: 01/28/2023   9:38 AM EST To: Luellen Pucker, RN Subject: RE: Patient cost for echo                      Where would it be scheduled? ----- Message ----- From: Luellen Pucker, RN Sent: 01/28/2023   9:20 AM EST To: [facility] Subject: Patient cost for echo                          Hello, Dr. Mayford Knife has ordered an echo for this patient, is there a way to find out what her cost would be? If you could let me know I will call the patient and see if she wants to proceed. Alcario Drought, RN

## 2023-02-11 NOTE — Telephone Encounter (Signed)
D/w Valaria Good RN , confirmed pt will let us know if she wants to set up [place]; once she knows what her insurance will cover.

## 2023-02-11 NOTE — Telephone Encounter (Signed)
Call to patient to see if she wants to proceed with Echo at cost stated for scheduled date in December. Patient states she would prefer to wait until January. Message sent to billing/pre-auth/scheduling.

## 2023-02-12 DIAGNOSIS — M5412 Radiculopathy, cervical region: Secondary | ICD-10-CM | POA: Diagnosis not present

## 2023-02-24 NOTE — Progress Notes (Unsigned)
  Cardiology Office Note:  .   Date:  02/24/2023  ID:  Charlotte Wise, DOB Aug 14, 1975, MRN [id number] PCP: Adela Glimpse, NP  [facility] Providers Cardiologist:  None { Click to update primary MD,subspecialty MD or APP then REFRESH:1}   Patient Profile: .      PMH Palpitations Paroxysmal SVT Hyperlipidemia Thyroid disease Hypertension  Referred to cardiology for evaluation of palpitations and fatigue. PCP ordered heart monitor that revealed predominant underlying rhythm sinus rhythm with average HR 67 bpm, several short runs of paroxysmal SVT longest lasting 14 beats, occasional PVCs, triggered events associated with sinus rhythm, rarely SVT. She reported history of fatigue for which she would take 3 caffeine pills to get out of bed.  She saw her PCP who recommended phentermine and 3 days later she started having bad palpitations. On one evening she started feeling poorly after having taken the medication in the morning.  Her heart rate was fast, BP was 190/110 mmHg and HR was 126 bpm.  She could not go to sleep.  She did not feel anxious at the time.  She stayed awake most of the night.  By 8 AM she was still feeling poorly and went to [facility] ER.  She was also taking a "liver detox" medication that she got from [facility].  She was also diagnosed with anxiety and given antianxiety medication.  She was taking Lopressor 50 mg daily and felt this had improved her symptoms.  She denied chest pain, shortness of breath, orthopnea, presyncope, syncope.  Beta-blocker was changed to Toprol-XL 25 mg daily.  Echocardiogram ordered to evaluate for structural heart disease.  She was recommended to undergo sleep test but deferred. One month follow-up was recommended.       History of Present Illness: .   Charlotte Wise is a *** 47 y.o. female ***   Discussed the use of AI scribe software for clinical note transcription with the patient, who gave verbal consent to proceed.   ROS: ***        Studies Reviewed: .        *** Risk Assessment/Calculations:   {Does this patient have ATRIAL FIBRILLATION?[phone]} No BP recorded.  {Refresh Note OR Click here to enter BP  :1}***       Physical Exam:   VS:  There were no vitals taken for this visit.   Wt Readings from Last 3 Encounters:  01/28/23 156 lb 12.8 oz (71.1 kg)  12/25/22 156 lb 9.6 oz (71 kg)  11/12/22 152 lb 6.4 oz (69.1 kg)    GEN: Well nourished, well developed in no acute distress NECK: No JVD; No carotid bruits CARDIAC: ***RRR, no murmurs, rubs, gallops RESPIRATORY:  Clear to auscultation without rales, wheezing or rhonchi  ABDOMEN: Soft, non-tender, non-distended EXTREMITIES:  No edema; No deformity     ASSESSMENT AND PLAN: .    Palpitations/SVT: Hypertension:    {Are you ordering a CV Procedure (e.g. stress test, cath, DCCV, TEE, etc)?   Press F2        :[id number]}  Dispo: ***  Signed, Eligha Bridegroom, NP-C

## 2023-02-26 ENCOUNTER — Encounter: Payer: Self-pay | Admitting: Gastroenterology

## 2023-02-26 ENCOUNTER — Ambulatory Visit: Payer: BC Managed Care – PPO | Admitting: Gastroenterology

## 2023-02-26 VITALS — BP 120/78 | HR 62 | Ht 60.0 in | Wt 162.0 lb

## 2023-02-26 DIAGNOSIS — K032 Erosion of teeth: Secondary | ICD-10-CM | POA: Diagnosis not present

## 2023-02-26 DIAGNOSIS — K580 Irritable bowel syndrome with diarrhea: Secondary | ICD-10-CM | POA: Diagnosis not present

## 2023-02-26 DIAGNOSIS — R109 Unspecified abdominal pain: Secondary | ICD-10-CM

## 2023-02-26 DIAGNOSIS — K219 Gastro-esophageal reflux disease without esophagitis: Secondary | ICD-10-CM | POA: Diagnosis not present

## 2023-02-26 DIAGNOSIS — Z860100 Personal history of colon polyps, unspecified: Secondary | ICD-10-CM

## 2023-02-26 MED ORDER — AMITRIPTYLINE HCL 50 MG PO TABS: 75.0000 mg | ORAL_TABLET | Freq: Every day | ORAL | 3 refills | Status: DC

## 2023-02-26 MED ORDER — NA SULFATE-K SULFATE-MG SULF 17.5-3.13-1.6 GM/177ML PO SOLN: 1.0000 | ORAL | 0 refills | Status: DC

## 2023-02-26 MED ORDER — AMITRIPTYLINE HCL 75 MG PO TABS: 75.0000 mg | ORAL_TABLET | Freq: Every day | ORAL | 2 refills | Status: DC

## 2023-02-26 NOTE — Patient Instructions (Addendum)
_______________________________________________________  If your blood pressure at your visit was 140/90 or greater, please contact your primary care physician to follow up on this.  _______________________________________________________  If you are age 48 or older, your body mass index should be between 23-30. Your Body mass index is 31.64 kg/m. If this is out of the aforementioned range listed, please consider follow up with your Primary Care Provider.  If you are age 74 or younger, your body mass index should be between 19-25. Your Body mass index is 31.64 kg/m. If this is out of the aformentioned range listed, please consider follow up with your Primary Care Provider.   ________________________________________________________  The [facility] providers would like to encourage you to use [facility] to communicate with providers for non-urgent requests or questions.  Due to long hold times on the telephone, sending your provider a message by [facility] may be a faster and more efficient way to get a response.  Please allow 48 business hours for a response.  Please remember that this is for non-urgent requests.  _______________________________________________________  We have sent the following medications to your pharmacy for you to pick up at your convenience:  INCREASE: Elavil to 75mg  daily at night  Please purchase the following medications over the counter and take as directed:  START: FD guard daily  You have been scheduled for a colonoscopy. Please follow written instructions given to you at your visit today.   Please pick up your prep supplies at the pharmacy within the next 1-3 days.  If you use inhalers (even only as needed), please bring them with you on the day of your procedure.  DO NOT TAKE 7 DAYS PRIOR TO TEST- Trulicity (dulaglutide) Ozempic, Wegovy (semaglutide) Mounjaro (tirzepatide) Bydureon Bcise (exanatide extended release)  DO NOT TAKE 1 DAY PRIOR TO YOUR  TEST Rybelsus (semaglutide) Adlyxin (lixisenatide) Victoza (liraglutide) Byetta (exanatide) ___________________________________________________________________________  Due to recent changes in healthcare laws, you may see the results of your imaging and laboratory studies on MyChart before your provider has had a chance to review them.  We understand that in some cases there may be results that are confusing or concerning to you. Not all laboratory results come back in the same time frame and the provider may be waiting for multiple results in order to interpret others.  Please give [place] 48 hours in order for your provider to thoroughly review all the results before contacting the office for clarification of your results.   It was a pleasure to see you today!  Vito Cirigliano, D.O.

## 2023-02-26 NOTE — Progress Notes (Signed)
Chief Complaint:    IBS, diarrhea  GI History: 47 year old female with a history of hepatic steatosis, anxiety, schizophreniform disorder, headaches, depression, hyperlipidemia, hypothyroidism, follows in the GI clinic for the following:   1) GERD: History of enamel erosion but no typical reflux symptoms until 02/2020 (heartburn, upper abdominal pain, early satiety) -02/2020: Normal CBC, CMP.  H. pylori breath test negative.  Started on Pepcid -03/2020: Changed Pepcid to omeprazole, discontinued all NSAIDs -04/2020: EGD: 2 cm tongue of salmon-colored mucosa (path negative for Barrett's), Hill grade 1, minimal non-H. 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  Stopped taking pantoprazole on her own and went back to omeprazole.  Feels intolerant of capsules/tablets and wants a liquid or chewable.  She crushes any tablets (even APAP) and opens capsules due to feeling of abdominal pain if she does not do this.  Started Prevacid referred for EM and pH/impedance testing off acid suppression therapy   2) IBS: Was previously predominantly constipation and well controlled with OTC mag supplements, but in 2022 started developing watery, nonbloody diarrhea with associated abdominal pain/cramping.  Now symptoms alternating, but more so diarrhea predominant. - 07/2020: Negative/normal GI PCR panel, calprotectin - 11/09/2020: CT AP: Gas and stool throughout the colon, otherwise normal - 11/21/2020: Normal CBC, CMP.  TSH 5.22 with repeat 3.57 in 12/2020 - 05/03/2021: GI follow-up.  Symptoms improving with Pepto-Bismol, psyllium, OTC digestive enzyme.  Normal pancreatic elastase, CBC, CMP, TSH - 08/07/2022: Started Elavil with good response and increased to 25 mg qhs.   Endoscopic History: -EGD (04/2020): 2 cm tongue of salmon-colored mucosa (path negative for  Barrett's),; mucosa, Hill grade 1, minimal non-H. pylori gastritis, normal duodenum -Colonoscopy (04/2020): 1 SSP, 1 traditional serrated adenoma, 3 benign polyps.  Sigmoid/cecal diverticulosis, grade 2 internal hemorrhoids.  Repeat in 3 years  HPI:     Patient is a 47 y.o. female presenting to the Gastroenterology Clinic for follow-up.  Was last seen by me in the office on 11/11/2022.  Main issue at that time was her IBS and diarrhea.  She reported overall improvement since starting Elavil 25 mg, and this was increased to 50 mg and prescribed Viberzi along with Lomotil on demand.  She reports no improvement with Viberzi so she stopped taking.  Also recommended trial of Benefiber and discussed possible polypharmacy along with going for SIBO testing.  She was scheduled for EM and pH/impedance on 01/07/2023, but she canceled and rescheduled for 05/06/2023.  Recommended retrialing lansoprazole, but she never started.  Today, she states she did not notice much improvement with the Viberzi or Lomotil, so she stopped taking.  Has started drinking black tea which she reports helps with the loose stools.  Not much change with lansoprazole; thinks they "cause more acid" and "generates body heat", so she stopped taking.   Still taking Elavil, and feels that has helped significantly w/ her GI sxs. She would like to increase dose a little to see if we can capture more control.   Reviewed most recent labs, including normal BMP.  Otherwise no recent labs or abdominal imaging for review since last appointment.   Review of systems:     No chest pain, no SOB, no fevers, no urinary sx   Past Medical History:  Diagnosis Date   Allergy    Anxiety    Arthritis    Diverticulosis    Gastritis  Hyperlipidemia 05/12/2018   Major depressive disorder, 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HCC) 04/15/2016   Schizophreniform disorder (HCC) 04/23/2016   Sciatica of right side 07/27/2019   Thyroid disease      Patient's surgical history, family medical history, social history, medications and allergies were all reviewed in Epic    Current Outpatient Medications  Medication Sig Dispense Refill   acetaminophen (TYLENOL) 500 MG tablet Take 500 mg by mouth every 6 (six) hours as needed. Takes 0-6 per day depending on her pain level     aluminum-magnesium hydroxide-simethicone (MAALOX) 200-200-20 MG/5ML SUSP Take 20 mLs by mouth 3 (three) times daily.     amitriptyline (ELAVIL) 50 MG tablet Take 1 tablet (50 mg total) by mouth at bedtime. 30 tablet 3   Bismuth Subsalicylate (PEPTO-BISMOL PO) Take by mouth as needed.     Ca Carbonate-Mag Hydroxide (ROLAIDS PO) Take 1 each by mouth as needed.     Glucosamine-Chondroitin 1500-1200 MG/30ML LIQD Take by mouth.     Ibuprofen 200 MG CAPS Take by mouth daily.     lactobacillus acidophilus & bulgar (LACTINEX) chewable tablet Chew 1 tablet by mouth 3 (three) times daily with meals. 90 tablet 1   levothyroxine (SYNTHROID) 75 MCG tablet TAKE 1 TABLET DAILY ON EMPTY STOMACH WITH ONLY WATER FOR 30 MINUTES & NO ANTACID MEDS, CALCIUM OR MAGNESIUM FOR 4 HOURS AND AVOID BIOTIN 90 tablet 3   LO LOESTRIN FE 1 MG-10 MCG / 10 MCG tablet Take 1 tablet by mouth daily.     MAGNESIUM PO Take 400 mg by mouth daily.     methocarbamol (ROBAXIN-750) 750 MG tablet Take 1 tablet (750 mg total) by mouth every 8 (eight) hours as needed for muscle spasms. 60 tablet 1   metoprolol succinate (TOPROL XL) 25 MG 24 hr tablet Take 1 tablet (25 mg total) by mouth daily. 90 tablet 3   Multiple Vitamins-Minerals (MULTIVITAMIN ADULTS PO) Take 1 tablet by mouth daily.     oxyCODONE-acetaminophen (PERCOCET/ROXICET) 5-325 MG tablet Take 1 tablet by mouth every 4 (four) hours as needed.     VITAMIN D PO Take 10,000 Units by mouth daily.     Zinc 50 MG TABS Take 1 tablet Daily  0   No current facility-administered medications for this visit.    Physical Exam:     BP 120/78   Pulse 62   Ht  5' (1.524 m)   Wt 162 lb (73.5 kg)   BMI 31.64 kg/m   GENERAL:  Pleasant female in NAD PSYCH: : Cooperative, normal affect Musculoskeletal:  Normal muscle tone, normal strength NEURO: Alert and oriented x 3, no focal neurologic deficits   IMPRESSION and PLAN:    1) IBS-D 2) Abdominal cramping Has had overall improvement with Elavil and requesting increase in dose.  Otherwise reports not much change with trial of Viberzi, Lomotil. - Increase Elavil to 75 mg nightly - Again discussed possibility of polypharmacy playing a role - Add Benefiber  3) GERD 4) Waterbrash 5) Enamel erosion She has subsequently stopped all acid suppression therapy - Proceed with Esophageal Manometry and pH/impedance study as scheduled in February (patient rescheduled from October).  To be done off all acid suppression therapy and evaluate for hypersensitivity, functional symptoms, etc.  6) History of colon polyps - Scheduling colonoscopy today for 04/2023 for ongoing polyp surveillance   The indications, risks, and benefits of colonoscopy were explained to the patient in detail. Risks include but are not limited to bleeding, perforation,  adverse reaction to medications, and cardiopulmonary compromise. Sequelae include but are not limited to the possibility of surgery, hospitalization, and mortality. The patient verbalized understanding and wished to proceed. All questions answered, referred to the scheduler and bowel prep ordered. Further recommendations pending results of the exam.   RTC in 6 months or sooner as needed      Shellia Cleverly ,DO, FACG 02/26/2023, 8:27 AM

## 2023-03-02 ENCOUNTER — Ambulatory Visit: Payer: BC Managed Care – PPO | Admitting: Nurse Practitioner

## 2023-03-02 NOTE — Telephone Encounter (Signed)
Ordering provider: DR Mayford Knife Associated diagnoses: G47.19 WatchPAT PA obtained on 03/02/2023 by Latrelle Dodrill, CMA. Authorization: No; tracking ID NO PA REQ FOR HST Patient notified of PIN ([id number]) on 03/02/2023 via Notification Method: phone.

## 2023-03-04 ENCOUNTER — Ambulatory Visit: Payer: BC Managed Care – PPO | Admitting: Nurse Practitioner

## 2023-03-09 ENCOUNTER — Other Ambulatory Visit (HOSPITAL_COMMUNITY): Payer: BC Managed Care – PPO

## 2023-03-09 DIAGNOSIS — M5412 Radiculopathy, cervical region: Secondary | ICD-10-CM | POA: Diagnosis not present

## 2023-03-10 ENCOUNTER — Encounter: Payer: Self-pay | Admitting: Nurse Practitioner

## 2023-03-10 ENCOUNTER — Other Ambulatory Visit: Payer: Self-pay | Admitting: Nurse Practitioner

## 2023-03-11 DIAGNOSIS — M25651 Stiffness of right hip, not elsewhere classified: Secondary | ICD-10-CM | POA: Diagnosis not present

## 2023-03-11 DIAGNOSIS — M9905 Segmental and somatic dysfunction of pelvic region: Secondary | ICD-10-CM | POA: Diagnosis not present

## 2023-03-11 DIAGNOSIS — M4322 Fusion of spine, cervical region: Secondary | ICD-10-CM | POA: Diagnosis not present

## 2023-03-11 DIAGNOSIS — M9903 Segmental and somatic dysfunction of lumbar region: Secondary | ICD-10-CM | POA: Diagnosis not present

## 2023-03-11 DIAGNOSIS — M7918 Myalgia, other site: Secondary | ICD-10-CM | POA: Diagnosis not present

## 2023-03-11 DIAGNOSIS — M9904 Segmental and somatic dysfunction of sacral region: Secondary | ICD-10-CM | POA: Diagnosis not present

## 2023-03-20 NOTE — Telephone Encounter (Signed)
Correct pt never picked up Itamar as she was checking with her insurance and was going to let us know.

## 2023-03-20 NOTE — Telephone Encounter (Signed)
Call to patient to see if she has completed home sleep study. Patient states that she did receive PIN number but never came in to pick up device as she was unsure about how much it would cost. She states she called her insurance and they told her that her patient cost would be between $800 and $3200. She states she cannot afford this and asks if there is any other alternative. I explained that [facility] only bills through insurance but we may be able to get her a sleep study from [facility] that is cheaper. Patient states she would be open to this. Patient responses forwarded to sleep studies team.

## 2023-03-23 NOTE — Telephone Encounter (Signed)
Itamar order canceled due to patient request. Patient states she would like to wait until after she meets her DED with a GI procedure in early 2025.

## 2023-03-23 NOTE — Telephone Encounter (Signed)
Confirmed with Billing Manager that self-pay discount is only available to patients who are uninsured. Spoke with patient to relay this information. She verbalizes understanding and reports that she has an upcoming GI procedure in February 2025 that should help her make progress toward meeting her deductible. She wishes to wait until after this procedure to proceed with any cardiac testing (sleep study and echo) and she agrees to call the office when she is ready to move forward. She denies any additional questions or concerns at this time.

## 2023-03-23 NOTE — Addendum Note (Signed)
Addended by: Luellen Pucker on: 03/23/2023 03:59 PM   Modules accepted: Orders

## 2023-03-27 DIAGNOSIS — M9903 Segmental and somatic dysfunction of lumbar region: Secondary | ICD-10-CM | POA: Diagnosis not present

## 2023-03-27 DIAGNOSIS — M7918 Myalgia, other site: Secondary | ICD-10-CM | POA: Diagnosis not present

## 2023-03-27 DIAGNOSIS — M25651 Stiffness of right hip, not elsewhere classified: Secondary | ICD-10-CM | POA: Diagnosis not present

## 2023-03-27 DIAGNOSIS — M4322 Fusion of spine, cervical region: Secondary | ICD-10-CM | POA: Diagnosis not present

## 2023-03-27 DIAGNOSIS — M9905 Segmental and somatic dysfunction of pelvic region: Secondary | ICD-10-CM | POA: Diagnosis not present

## 2023-03-27 DIAGNOSIS — M9904 Segmental and somatic dysfunction of sacral region: Secondary | ICD-10-CM | POA: Diagnosis not present

## 2023-03-30 ENCOUNTER — Other Ambulatory Visit (HOSPITAL_COMMUNITY): Payer: BC Managed Care – PPO

## 2023-04-02 ENCOUNTER — Encounter: Payer: Self-pay | Admitting: Nurse Practitioner

## 2023-04-02 ENCOUNTER — Ambulatory Visit: Payer: BC Managed Care – PPO | Attending: Nurse Practitioner | Admitting: Nurse Practitioner

## 2023-04-02 VITALS — BP 128/72 | HR 63 | Ht 60.5 in | Wt 164.0 lb

## 2023-04-02 DIAGNOSIS — F419 Anxiety disorder, unspecified: Secondary | ICD-10-CM

## 2023-04-02 DIAGNOSIS — R002 Palpitations: Secondary | ICD-10-CM

## 2023-04-02 DIAGNOSIS — G4719 Other hypersomnia: Secondary | ICD-10-CM | POA: Diagnosis not present

## 2023-04-02 DIAGNOSIS — I471 Supraventricular tachycardia, unspecified: Secondary | ICD-10-CM

## 2023-04-02 DIAGNOSIS — E782 Mixed hyperlipidemia: Secondary | ICD-10-CM

## 2023-04-02 MED ORDER — METOPROLOL TARTRATE 25 MG PO TABS: ORAL_TABLET | ORAL | 3 refills | Status: DC

## 2023-04-02 NOTE — Progress Notes (Signed)
 Office Visit    Patient Name: Charlotte Wise Date of Encounter: 04/02/2023  Primary Care Provider:  Laurice President, NP Primary Cardiologist:  Wilbert Bihari, MD  Chief Complaint    48 year old female with a history of palpitations, PSVT, hyperlipidemia, anxiety, depression, and thyroid  disease who presents for follow-up related to palpitations, SVT.  Past Medical History    Past Medical History:  Diagnosis Date   Allergy    Anxiety    Arthritis    Diverticulosis    Gastritis    Hyperlipidemia 05/12/2018   Major depressive disorder, 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HCC) 04/15/2016   Schizophreniform disorder (HCC) 04/23/2016   Sciatica of right side 07/27/2019   Thyroid  disease    Past Surgical History:  Procedure Laterality Date   ANKLE ARTHROSCOPY Right 2000   COLONOSCOPY  05/01/2020   KNEE ARTHROSCOPY Left 1988   KNEE ARTHROSCOPY Right 1999   LAPAROSCOPY  2001   NASAL POLYP SURGERY  2006   TONSILLECTOMY AND ADENOIDECTOMY  1981   UPPER GASTROINTESTINAL ENDOSCOPY  05/01/2020    Allergies  Allergies  Allergen Reactions   Hydrocodone Itching   Oxycodone -Acetaminophen     Penicillins Swelling     Labs/Other Studies Reviewed    The following studies were reviewed today:  Cardiac Studies & Procedures        MONITORS  LONG TERM MONITOR (3-14 DAYS) 12/08/2022  Narrative   Patient had a minimum heart rate of 47 bpm, maximum heart rate of 138 bpm, and average heart rate of 67 bpm.   Predominant underlying rhythm was sinus rhythm.   Several short runs of paroxysmal supraventricular tachycardia, longest was 14 beats.  Fastest was 138 bpm.   Isolated PACs were rare (<1.0%).   Isolated PVCs were occasional (1.4%).   Triggered and diary events associated with sinus rhythm, rarely SVT.  Majority of events associated with sinus rhythm.  Rarely may have symptomatic SVT.          Recent Labs: 06/09/2022: Magnesium  2.2 09/15/2022: TSH 1.99 11/08/2022: ALT  18; Hemoglobin 12.1; Platelets 390 01/28/2023: BUN 6; Creatinine, Ser 0.81; Potassium 5.2; Sodium 140  Recent Lipid Panel    Component Value Date/Time   CHOL 175 09/15/2022 1434   TRIG 118 09/15/2022 1434   HDL 65 09/15/2022 1434   CHOLHDL 2.7 09/15/2022 1434   VLDL 26 10/09/2016 1604   LDLCALC 89 09/15/2022 1434    History of Present Illness    48 year old female with the above past medical history including palpitations, PSVT, hyperlipidemia, anxiety, depression, and thyroid  disease.  Cardiac monitor in 11/2022 in the setting of palpitations following initiation of phentermine  therapy revealed predominantly sinus rhythm, several short runs of PSVT, longest lasting 14 beats, rare PACs and PVCs.  She was started on metoprolol  and was referred to cardiology.  She was last seen in the office on 01/28/2023 and was stable from a cardiac standpoint.  She did note some improvement in her palpitations with addition of beta-blocker therapy.  She was transition from metoprolol  to tartrate to Toprol  25 mg daily.  Echocardiogram was ordered but not completed.  She noted excessive daytime sleepiness, she was referred for sleep study, however, this was also deferred due to cost.  She presents today for follow-up.  Since her last visit she has been stable overall from a cardiac standpoint.  She continues to note episodes of intermittent fleeting palpitations,  her last episode was approximately 2 weeks ago.  Her symptoms occur when she lays down  to go to sleep at night.  She denies any significant associated symptoms.  Denies chest pain, dyspnea, dizziness, syncope, edema, PND, orthopnea.  Denies any significant anxiety.  Other than her ongoing palpitations, she denies any additional concerns today.  Home Medications    Current Outpatient Medications  Medication Sig Dispense Refill   acetaminophen  (TYLENOL ) 500 MG tablet Take 500 mg by mouth every 6 (six) hours as needed. Takes 0-6 per day depending on her  pain level     aluminum-magnesium  hydroxide-simethicone  (MAALOX) 200-200-20 MG/5ML SUSP Take 20 mLs by mouth 3 (three) times daily.     Bismuth Subsalicylate (PEPTO-BISMOL PO) Take by mouth as needed.     Ca Carbonate-Mag Hydroxide (ROLAIDS PO) Take 1 each by mouth as needed.     Glucosamine-Chondroitin 1500-1200 MG/30ML LIQD Take by mouth.     Ibuprofen 200 MG CAPS Take by mouth daily.     lactobacillus acidophilus & bulgar (LACTINEX) chewable tablet Chew 1 tablet by mouth 3 (three) times daily with meals. 90 tablet 1   levothyroxine  (SYNTHROID ) 75 MCG tablet TAKE 1 TABLET DAILY ON EMPTY STOMACH WITH ONLY WATER FOR 30 MINUTES & NO ANTACID MEDS, CALCIUM OR MAGNESIUM  FOR 4 HOURS AND AVOID BIOTIN 90 tablet 3   LO LOESTRIN FE 1 MG-10 MCG / 10 MCG tablet Take 1 tablet by mouth daily.     MAGNESIUM  PO Take 400 mg by mouth daily.     methocarbamol  (ROBAXIN -750) 750 MG tablet Take 1 tablet (750 mg total) by mouth every 8 (eight) hours as needed for muscle spasms. 60 tablet 1   metoprolol  succinate (TOPROL  XL) 25 MG 24 hr tablet Take 1 tablet (25 mg total) by mouth daily. 90 tablet 3   Multiple Vitamins-Minerals (MULTIVITAMIN ADULTS PO) Take 1 tablet by mouth daily.     Na Sulfate-K Sulfate-Mg Sulf (SUPREP BOWEL PREP KIT) 17.5-3.13-1.6 GM/177ML SOLN Take 1 kit by mouth as directed. 324 mL 0   oxyCODONE -acetaminophen  (PERCOCET/ROXICET) 5-325 MG tablet Take 1 tablet by mouth every 4 (four) hours as needed.     promethazine  (PHENERGAN ) 25 MG tablet TAKE 1 TABLET BY MOUTH EVERY 6 HOURS AS NEEDED FOR NAUSEA OR HEADACHE 30 tablet 0   VITAMIN D  PO Take 10,000 Units by mouth daily.     Zinc  50 MG TABS Take 1 tablet Daily  0   amitriptyline  (ELAVIL ) 75 MG tablet Take 1 tablet (75 mg total) by mouth at bedtime. Pharmacy d/c rx for 50 mg dosing. thx (Patient not taking: Reported on 04/02/2023) 30 tablet 2   No current facility-administered medications for this visit.     Review of Systems    She denies chest  pain, dyspnea, pnd, orthopnea, n, v, dizziness, syncope, edema, weight gain, or early satiety. All other systems reviewed and are otherwise negative except as noted above.   Physical Exam    VS:  BP 128/72   Pulse 63   Ht 5' 0.5 (1.537 m)   Wt 164 lb (74.4 kg)   SpO2 97%   BMI 31.50 kg/m  GEN: Well nourished, well developed, in no acute distress. HEENT: normal. Neck: Supple, no JVD, carotid bruits, or masses. Cardiac: RRR, no murmurs, rubs, or gallops. No clubbing, cyanosis, edema.  Radials/DP/PT 2+ and equal bilaterally.  Respiratory:  Respirations regular and unlabored, clear to auscultation bilaterally. GI: Soft, nontender, nondistended, BS + x 4. MS: no deformity or atrophy. Skin: warm and dry, no rash. Neuro:  Strength and sensation are intact. Psych: Normal  affect.  Accessory Clinical Findings    ECG personally reviewed by me today -    - no acute changes.   Lab Results  Component Value Date   WBC 7.4 11/08/2022   HGB 12.1 11/08/2022   HCT 35.9 (L) 11/08/2022   MCV 93.7 11/08/2022   PLT 390 11/08/2022   Lab Results  Component Value Date   CREATININE 0.81 01/28/2023   BUN 6 01/28/2023   NA 140 01/28/2023   K 5.2 01/28/2023   CL 104 01/28/2023   CO2 26 01/28/2023   Lab Results  Component Value Date   ALT 18 11/08/2022   AST 29 11/08/2022   ALKPHOS 40 11/08/2022   BILITOT 0.9 11/08/2022   Lab Results  Component Value Date   CHOL 175 09/15/2022   HDL 65 09/15/2022   LDLCALC 89 09/15/2022   TRIG 118 09/15/2022   CHOLHDL 2.7 09/15/2022    Lab Results  Component Value Date   HGBA1C 5.3 06/09/2022    Assessment & Plan    1. Palpitations/PSVT: Cardiac monitor in 11/2022 in the setting of palpitations following initiation of phentermine  therapy revealed predominantly sinus rhythm, several short runs of PSVT, longest lasting 14 beats, rare PACs and PVCs.  She was started on metoprolol . She continues to note episodes of intermittent palpitations, no  significant associated symptoms..  Given history of daytime fatigue and infrequent nature of her palpitations, rather than increasing her daily beta-blocker dose, will add metoprolol  to tartrate 25 mg twice daily as needed for breakthrough palpitations.  Reviewed vagal maneuvers, triggers, ED precautions. Encouraged her to proceed with echocardiogram when no longer cost prohibitive.  Continue metoprolol .  2. Excessive daytime sleepiness: She was referred for sleep study, however, this has been deferred due to cost.  Continue to monitor symptoms.  3. Hyperlipidemia: LDL was 89 in 08/2022.  Not on statin therapy.  Monitored and managed per PCP.  3. Anxiety: We discussed this as a potential trigger for palpitations, however, she denies any significant anxiety at this time.  4. Disposition: Follow-up in 6 months, sooner if needed.      Damien JAYSON Braver, NP 04/02/2023, 8:15 AM

## 2023-04-02 NOTE — Patient Instructions (Addendum)
 Medication Instructions:  Continue Metoprolol  Succinate 25 mg daily Start Metoprolol  Tartrate 25 mg twice daily as needed for palpitations  *If you need a refill on your cardiac medications before your next appointment, please call your pharmacy*   Lab Work: NONE ordered at this time of appointment   Testing/Procedures: NONE ordered at this time of appointment   Follow-Up: At [facility], you and your health needs are our priority.  As part of our continuing mission to provide you with exceptional heart care, we have created designated Provider Care Teams.  These Care Teams include your primary Cardiologist (physician) and Advanced Practice Providers (APPs -  Physician Assistants and Nurse Practitioners) who all work together to provide you with the care you need, when you need it.  We recommend signing up for the patient portal called MyChart.  Sign up information is provided on this After Visit Summary.  MyChart is used to connect with patients for Virtual Visits (Telemedicine).  Patients are able to view lab/test results, encounter notes, upcoming appointments, etc.  Non-urgent messages can be sent to your provider as well.   To learn more about what you can do with MyChart, go to [email or web address].    Your next appointment:   6 month(s)  Provider:   Wilbert Bihari, MD

## 2023-04-03 DIAGNOSIS — M545 Low back pain, unspecified: Secondary | ICD-10-CM | POA: Diagnosis not present

## 2023-04-03 DIAGNOSIS — M5013 Cervical disc disorder with radiculopathy, cervicothoracic region: Secondary | ICD-10-CM | POA: Diagnosis not present

## 2023-04-07 DIAGNOSIS — M5440 Lumbago with sciatica, unspecified side: Secondary | ICD-10-CM | POA: Diagnosis not present

## 2023-04-07 DIAGNOSIS — M542 Cervicalgia: Secondary | ICD-10-CM | POA: Diagnosis not present

## 2023-04-07 DIAGNOSIS — M5412 Radiculopathy, cervical region: Secondary | ICD-10-CM | POA: Diagnosis not present

## 2023-04-09 DIAGNOSIS — Z79891 Long term (current) use of opiate analgesic: Secondary | ICD-10-CM | POA: Diagnosis not present

## 2023-04-09 DIAGNOSIS — M5412 Radiculopathy, cervical region: Secondary | ICD-10-CM | POA: Diagnosis not present

## 2023-04-20 ENCOUNTER — Encounter: Payer: Self-pay | Admitting: Gastroenterology

## 2023-04-22 ENCOUNTER — Encounter: Payer: Self-pay | Admitting: Cardiology

## 2023-04-22 ENCOUNTER — Encounter: Payer: Self-pay | Admitting: Gastroenterology

## 2023-04-28 ENCOUNTER — Other Ambulatory Visit: Payer: Self-pay | Admitting: Neurosurgery

## 2023-04-28 DIAGNOSIS — M5412 Radiculopathy, cervical region: Secondary | ICD-10-CM

## 2023-04-29 ENCOUNTER — Encounter: Payer: BC Managed Care – PPO | Admitting: Gastroenterology

## 2023-05-05 ENCOUNTER — Ambulatory Visit: Payer: BC Managed Care – PPO | Admitting: Nurse Practitioner

## 2023-05-06 ENCOUNTER — Ambulatory Visit (HOSPITAL_COMMUNITY)
Admission: RE | Admit: 2023-05-06 | Payer: BC Managed Care – PPO | Source: Home / Self Care | Admitting: Gastroenterology

## 2023-05-06 ENCOUNTER — Encounter (HOSPITAL_COMMUNITY): Admission: RE | Payer: Self-pay | Source: Home / Self Care

## 2023-05-06 SURGERY — MANOMETRY, ESOPHAGUS

## 2023-05-07 DIAGNOSIS — M5412 Radiculopathy, cervical region: Secondary | ICD-10-CM | POA: Diagnosis not present

## 2023-05-08 ENCOUNTER — Telehealth: Payer: Self-pay

## 2023-05-08 DIAGNOSIS — M7918 Myalgia, other site: Secondary | ICD-10-CM | POA: Diagnosis not present

## 2023-05-08 DIAGNOSIS — M9903 Segmental and somatic dysfunction of lumbar region: Secondary | ICD-10-CM | POA: Diagnosis not present

## 2023-05-08 DIAGNOSIS — M9904 Segmental and somatic dysfunction of sacral region: Secondary | ICD-10-CM | POA: Diagnosis not present

## 2023-05-08 DIAGNOSIS — M9905 Segmental and somatic dysfunction of pelvic region: Secondary | ICD-10-CM | POA: Diagnosis not present

## 2023-05-08 DIAGNOSIS — M25651 Stiffness of right hip, not elsewhere classified: Secondary | ICD-10-CM | POA: Diagnosis not present

## 2023-05-08 DIAGNOSIS — M4322 Fusion of spine, cervical region: Secondary | ICD-10-CM | POA: Diagnosis not present

## 2023-05-08 MED ORDER — METOPROLOL TARTRATE 25 MG PO TABS: 25.0000 mg | ORAL_TABLET | Freq: Two times a day (BID) | ORAL | 3 refills | Status: AC

## 2023-05-08 NOTE — Telephone Encounter (Signed)
Per Dr. Mayford Knife, discontinuing Toprol. Restarting metoprolol tartrate 25 mg twice daily; patient can take an extra metoprolol if she has breakthrough palpitations. Patient notified via MyChart.

## 2023-05-08 NOTE — Discharge Instructions (Signed)

## 2023-05-11 ENCOUNTER — Ambulatory Visit
Admission: RE | Admit: 2023-05-11 | Discharge: 2023-05-11 | Disposition: A | Discharge status: Home / Self Care | Payer: BC Managed Care – PPO | Source: Ambulatory Visit | Attending: Neurosurgery | Admitting: Neurosurgery

## 2023-05-11 DIAGNOSIS — M5412 Radiculopathy, cervical region: Secondary | ICD-10-CM

## 2023-05-11 DIAGNOSIS — Z981 Arthrodesis status: Secondary | ICD-10-CM | POA: Diagnosis not present

## 2023-05-11 DIAGNOSIS — M4802 Spinal stenosis, cervical region: Secondary | ICD-10-CM | POA: Diagnosis not present

## 2023-05-11 MED ORDER — IOPAMIDOL (ISOVUE-M 300) INJECTION 61%
10.0000 mL | Freq: Once | INTRAMUSCULAR | Status: AC
  Administered 2023-05-11: 10 mL via INTRATHECAL

## 2023-05-11 MED ORDER — ONDANSETRON HCL 4 MG/2ML IJ SOLN: 4.0000 mg | Freq: Once | INTRAMUSCULAR | Status: DC | PRN

## 2023-05-11 MED ORDER — MEPERIDINE HCL 50 MG/ML IJ SOLN: 50.0000 mg | Freq: Once | INTRAMUSCULAR | Status: DC | PRN

## 2023-05-11 MED ORDER — DIAZEPAM 5 MG PO TABS
10.0000 mg | ORAL_TABLET | Freq: Once | ORAL | Status: AC
  Administered 2023-05-11: 10 mg via ORAL

## 2023-05-19 DIAGNOSIS — Z6830 Body mass index (BMI) 30.0-30.9, adult: Secondary | ICD-10-CM | POA: Diagnosis not present

## 2023-05-19 DIAGNOSIS — M542 Cervicalgia: Secondary | ICD-10-CM | POA: Diagnosis not present

## 2023-05-26 DIAGNOSIS — H43393 Other vitreous opacities, bilateral: Secondary | ICD-10-CM | POA: Diagnosis not present

## 2023-05-26 DIAGNOSIS — D3131 Benign neoplasm of right choroid: Secondary | ICD-10-CM | POA: Diagnosis not present

## 2023-05-26 DIAGNOSIS — H35363 Drusen (degenerative) of macula, bilateral: Secondary | ICD-10-CM | POA: Diagnosis not present

## 2023-05-26 DIAGNOSIS — Q120 Congenital cataract: Secondary | ICD-10-CM | POA: Diagnosis not present

## 2023-05-28 DIAGNOSIS — R030 Elevated blood-pressure reading, without diagnosis of hypertension: Secondary | ICD-10-CM | POA: Diagnosis not present

## 2023-05-28 DIAGNOSIS — E782 Mixed hyperlipidemia: Secondary | ICD-10-CM | POA: Diagnosis not present

## 2023-05-28 DIAGNOSIS — E039 Hypothyroidism, unspecified: Secondary | ICD-10-CM | POA: Diagnosis not present

## 2023-05-28 DIAGNOSIS — Z0000 Encounter for general adult medical examination without abnormal findings: Secondary | ICD-10-CM | POA: Diagnosis not present

## 2023-05-28 DIAGNOSIS — E559 Vitamin D deficiency, unspecified: Secondary | ICD-10-CM | POA: Diagnosis not present

## 2023-06-04 DIAGNOSIS — M961 Postlaminectomy syndrome, not elsewhere classified: Secondary | ICD-10-CM | POA: Diagnosis not present

## 2023-06-05 ENCOUNTER — Encounter: Payer: Self-pay | Admitting: Gastroenterology

## 2023-06-09 ENCOUNTER — Encounter: Payer: BC Managed Care – PPO | Admitting: Nurse Practitioner

## 2023-06-09 DIAGNOSIS — R944 Abnormal results of kidney function studies: Secondary | ICD-10-CM | POA: Diagnosis not present

## 2023-06-10 DIAGNOSIS — M542 Cervicalgia: Secondary | ICD-10-CM | POA: Diagnosis not present

## 2023-07-01 DIAGNOSIS — K219 Gastro-esophageal reflux disease without esophagitis: Secondary | ICD-10-CM | POA: Diagnosis not present

## 2023-07-01 DIAGNOSIS — K589 Irritable bowel syndrome without diarrhea: Secondary | ICD-10-CM | POA: Diagnosis not present

## 2023-07-01 DIAGNOSIS — R252 Cramp and spasm: Secondary | ICD-10-CM | POA: Diagnosis not present

## 2023-07-02 DIAGNOSIS — M961 Postlaminectomy syndrome, not elsewhere classified: Secondary | ICD-10-CM | POA: Diagnosis not present

## 2023-07-03 DIAGNOSIS — M4322 Fusion of spine, cervical region: Secondary | ICD-10-CM | POA: Diagnosis not present

## 2023-07-03 DIAGNOSIS — M25651 Stiffness of right hip, not elsewhere classified: Secondary | ICD-10-CM | POA: Diagnosis not present

## 2023-07-03 DIAGNOSIS — M9904 Segmental and somatic dysfunction of sacral region: Secondary | ICD-10-CM | POA: Diagnosis not present

## 2023-07-03 DIAGNOSIS — M7918 Myalgia, other site: Secondary | ICD-10-CM | POA: Diagnosis not present

## 2023-07-03 DIAGNOSIS — M9903 Segmental and somatic dysfunction of lumbar region: Secondary | ICD-10-CM | POA: Diagnosis not present

## 2023-07-03 DIAGNOSIS — M9905 Segmental and somatic dysfunction of pelvic region: Secondary | ICD-10-CM | POA: Diagnosis not present

## 2023-07-24 ENCOUNTER — Encounter: Payer: Self-pay | Admitting: Gastroenterology

## 2023-07-30 DIAGNOSIS — M5412 Radiculopathy, cervical region: Secondary | ICD-10-CM | POA: Diagnosis not present

## 2023-07-30 DIAGNOSIS — Z79891 Long term (current) use of opiate analgesic: Secondary | ICD-10-CM | POA: Diagnosis not present

## 2023-08-07 DIAGNOSIS — M5013 Cervical disc disorder with radiculopathy, cervicothoracic region: Secondary | ICD-10-CM | POA: Diagnosis not present

## 2023-08-07 DIAGNOSIS — M545 Low back pain, unspecified: Secondary | ICD-10-CM | POA: Diagnosis not present

## 2023-08-18 DIAGNOSIS — M542 Cervicalgia: Secondary | ICD-10-CM | POA: Diagnosis not present

## 2023-08-20 DIAGNOSIS — L821 Other seborrheic keratosis: Secondary | ICD-10-CM | POA: Diagnosis not present

## 2023-08-20 DIAGNOSIS — D1801 Hemangioma of skin and subcutaneous tissue: Secondary | ICD-10-CM | POA: Diagnosis not present

## 2023-08-20 DIAGNOSIS — L814 Other melanin hyperpigmentation: Secondary | ICD-10-CM | POA: Diagnosis not present

## 2023-08-20 DIAGNOSIS — L603 Nail dystrophy: Secondary | ICD-10-CM | POA: Diagnosis not present

## 2023-08-24 DIAGNOSIS — M5412 Radiculopathy, cervical region: Secondary | ICD-10-CM | POA: Diagnosis not present

## 2023-09-14 DIAGNOSIS — M9904 Segmental and somatic dysfunction of sacral region: Secondary | ICD-10-CM | POA: Diagnosis not present

## 2023-09-14 DIAGNOSIS — M25551 Pain in right hip: Secondary | ICD-10-CM | POA: Diagnosis not present

## 2023-09-14 DIAGNOSIS — M9903 Segmental and somatic dysfunction of lumbar region: Secondary | ICD-10-CM | POA: Diagnosis not present

## 2023-09-14 DIAGNOSIS — M7918 Myalgia, other site: Secondary | ICD-10-CM | POA: Diagnosis not present

## 2023-09-14 DIAGNOSIS — M25651 Stiffness of right hip, not elsewhere classified: Secondary | ICD-10-CM | POA: Diagnosis not present

## 2023-09-14 DIAGNOSIS — M4322 Fusion of spine, cervical region: Secondary | ICD-10-CM | POA: Diagnosis not present

## 2023-09-14 DIAGNOSIS — M9905 Segmental and somatic dysfunction of pelvic region: Secondary | ICD-10-CM | POA: Diagnosis not present

## 2023-09-17 DIAGNOSIS — M7918 Myalgia, other site: Secondary | ICD-10-CM | POA: Diagnosis not present

## 2023-09-17 DIAGNOSIS — M9905 Segmental and somatic dysfunction of pelvic region: Secondary | ICD-10-CM | POA: Diagnosis not present

## 2023-09-17 DIAGNOSIS — M9903 Segmental and somatic dysfunction of lumbar region: Secondary | ICD-10-CM | POA: Diagnosis not present

## 2023-09-17 DIAGNOSIS — M4322 Fusion of spine, cervical region: Secondary | ICD-10-CM | POA: Diagnosis not present

## 2023-09-17 DIAGNOSIS — M9904 Segmental and somatic dysfunction of sacral region: Secondary | ICD-10-CM | POA: Diagnosis not present

## 2023-09-17 DIAGNOSIS — M25651 Stiffness of right hip, not elsewhere classified: Secondary | ICD-10-CM | POA: Diagnosis not present

## 2023-09-21 DIAGNOSIS — M25651 Stiffness of right hip, not elsewhere classified: Secondary | ICD-10-CM | POA: Diagnosis not present

## 2023-09-21 DIAGNOSIS — M7918 Myalgia, other site: Secondary | ICD-10-CM | POA: Diagnosis not present

## 2023-09-21 DIAGNOSIS — M4322 Fusion of spine, cervical region: Secondary | ICD-10-CM | POA: Diagnosis not present

## 2023-09-21 DIAGNOSIS — M9903 Segmental and somatic dysfunction of lumbar region: Secondary | ICD-10-CM | POA: Diagnosis not present

## 2023-09-21 DIAGNOSIS — M9905 Segmental and somatic dysfunction of pelvic region: Secondary | ICD-10-CM | POA: Diagnosis not present

## 2023-09-21 DIAGNOSIS — M961 Postlaminectomy syndrome, not elsewhere classified: Secondary | ICD-10-CM | POA: Diagnosis not present

## 2023-09-21 DIAGNOSIS — M9904 Segmental and somatic dysfunction of sacral region: Secondary | ICD-10-CM | POA: Diagnosis not present

## 2023-09-30 DIAGNOSIS — M5013 Cervical disc disorder with radiculopathy, cervicothoracic region: Secondary | ICD-10-CM | POA: Diagnosis not present

## 2023-09-30 DIAGNOSIS — M545 Low back pain, unspecified: Secondary | ICD-10-CM | POA: Diagnosis not present

## 2023-10-05 ENCOUNTER — Ambulatory Visit: Admitting: Gastroenterology

## 2023-10-12 DIAGNOSIS — M7918 Myalgia, other site: Secondary | ICD-10-CM | POA: Diagnosis not present

## 2023-10-15 DIAGNOSIS — M5412 Radiculopathy, cervical region: Secondary | ICD-10-CM | POA: Diagnosis not present

## 2023-10-16 DIAGNOSIS — M7918 Myalgia, other site: Secondary | ICD-10-CM | POA: Diagnosis not present

## 2023-10-16 DIAGNOSIS — M9903 Segmental and somatic dysfunction of lumbar region: Secondary | ICD-10-CM | POA: Diagnosis not present

## 2023-10-16 DIAGNOSIS — M9904 Segmental and somatic dysfunction of sacral region: Secondary | ICD-10-CM | POA: Diagnosis not present

## 2023-10-16 DIAGNOSIS — M25651 Stiffness of right hip, not elsewhere classified: Secondary | ICD-10-CM | POA: Diagnosis not present

## 2023-10-16 DIAGNOSIS — M4322 Fusion of spine, cervical region: Secondary | ICD-10-CM | POA: Diagnosis not present

## 2023-10-16 DIAGNOSIS — M9905 Segmental and somatic dysfunction of pelvic region: Secondary | ICD-10-CM | POA: Diagnosis not present

## 2023-11-11 DIAGNOSIS — Z124 Encounter for screening for malignant neoplasm of cervix: Secondary | ICD-10-CM | POA: Diagnosis not present

## 2023-11-11 DIAGNOSIS — N911 Secondary amenorrhea: Secondary | ICD-10-CM | POA: Diagnosis not present

## 2023-11-11 DIAGNOSIS — Z01419 Encounter for gynecological examination (general) (routine) without abnormal findings: Secondary | ICD-10-CM | POA: Diagnosis not present

## 2023-11-11 DIAGNOSIS — N644 Mastodynia: Secondary | ICD-10-CM | POA: Diagnosis not present

## 2023-11-18 DIAGNOSIS — M5412 Radiculopathy, cervical region: Secondary | ICD-10-CM | POA: Diagnosis not present

## 2023-11-26 DIAGNOSIS — M9903 Segmental and somatic dysfunction of lumbar region: Secondary | ICD-10-CM | POA: Diagnosis not present

## 2023-11-26 DIAGNOSIS — M9905 Segmental and somatic dysfunction of pelvic region: Secondary | ICD-10-CM | POA: Diagnosis not present

## 2023-11-26 DIAGNOSIS — M9902 Segmental and somatic dysfunction of thoracic region: Secondary | ICD-10-CM | POA: Diagnosis not present

## 2023-11-26 DIAGNOSIS — M7918 Myalgia, other site: Secondary | ICD-10-CM | POA: Diagnosis not present

## 2023-11-26 DIAGNOSIS — M4322 Fusion of spine, cervical region: Secondary | ICD-10-CM | POA: Diagnosis not present

## 2023-11-26 DIAGNOSIS — M542 Cervicalgia: Secondary | ICD-10-CM | POA: Diagnosis not present

## 2023-11-26 DIAGNOSIS — M25651 Stiffness of right hip, not elsewhere classified: Secondary | ICD-10-CM | POA: Diagnosis not present

## 2023-11-26 DIAGNOSIS — G5600 Carpal tunnel syndrome, unspecified upper limb: Secondary | ICD-10-CM | POA: Diagnosis not present

## 2023-11-26 DIAGNOSIS — M9904 Segmental and somatic dysfunction of sacral region: Secondary | ICD-10-CM | POA: Diagnosis not present

## 2023-12-01 DIAGNOSIS — M5013 Cervical disc disorder with radiculopathy, cervicothoracic region: Secondary | ICD-10-CM | POA: Diagnosis not present

## 2023-12-01 DIAGNOSIS — M545 Low back pain, unspecified: Secondary | ICD-10-CM | POA: Diagnosis not present

## 2023-12-07 DIAGNOSIS — F411 Generalized anxiety disorder: Secondary | ICD-10-CM | POA: Diagnosis not present

## 2023-12-07 DIAGNOSIS — R531 Weakness: Secondary | ICD-10-CM | POA: Diagnosis not present

## 2023-12-07 DIAGNOSIS — M5412 Radiculopathy, cervical region: Secondary | ICD-10-CM | POA: Diagnosis not present

## 2023-12-08 DIAGNOSIS — M542 Cervicalgia: Secondary | ICD-10-CM | POA: Diagnosis not present

## 2023-12-08 DIAGNOSIS — Z6829 Body mass index (BMI) 29.0-29.9, adult: Secondary | ICD-10-CM | POA: Diagnosis not present

## 2023-12-10 ENCOUNTER — Other Ambulatory Visit: Payer: Self-pay | Admitting: Neurosurgery

## 2023-12-10 DIAGNOSIS — M542 Cervicalgia: Secondary | ICD-10-CM

## 2023-12-11 ENCOUNTER — Encounter: Payer: Self-pay | Admitting: Neurosurgery

## 2023-12-15 DIAGNOSIS — L538 Other specified erythematous conditions: Secondary | ICD-10-CM | POA: Diagnosis not present

## 2023-12-15 DIAGNOSIS — L720 Epidermal cyst: Secondary | ICD-10-CM | POA: Diagnosis not present

## 2023-12-16 ENCOUNTER — Inpatient Hospital Stay
Admission: RE | Admit: 2023-12-16 | Discharge: 2023-12-16 | Discharge status: Home / Self Care | Source: Ambulatory Visit | Attending: Neurosurgery | Admitting: Neurosurgery

## 2023-12-16 DIAGNOSIS — M5020 Other cervical disc displacement, unspecified cervical region: Secondary | ICD-10-CM | POA: Diagnosis not present

## 2023-12-16 DIAGNOSIS — M47812 Spondylosis without myelopathy or radiculopathy, cervical region: Secondary | ICD-10-CM | POA: Diagnosis not present

## 2023-12-16 DIAGNOSIS — M4802 Spinal stenosis, cervical region: Secondary | ICD-10-CM | POA: Diagnosis not present

## 2023-12-16 DIAGNOSIS — M542 Cervicalgia: Secondary | ICD-10-CM

## 2023-12-16 DIAGNOSIS — M5412 Radiculopathy, cervical region: Secondary | ICD-10-CM | POA: Diagnosis not present

## 2023-12-21 DIAGNOSIS — F419 Anxiety disorder, unspecified: Secondary | ICD-10-CM | POA: Diagnosis not present

## 2023-12-21 DIAGNOSIS — M7918 Myalgia, other site: Secondary | ICD-10-CM | POA: Diagnosis not present

## 2023-12-21 DIAGNOSIS — M961 Postlaminectomy syndrome, not elsewhere classified: Secondary | ICD-10-CM | POA: Diagnosis not present

## 2023-12-21 DIAGNOSIS — M9905 Segmental and somatic dysfunction of pelvic region: Secondary | ICD-10-CM | POA: Diagnosis not present

## 2023-12-21 DIAGNOSIS — M9902 Segmental and somatic dysfunction of thoracic region: Secondary | ICD-10-CM | POA: Diagnosis not present

## 2023-12-21 DIAGNOSIS — M5412 Radiculopathy, cervical region: Secondary | ICD-10-CM | POA: Diagnosis not present

## 2023-12-21 DIAGNOSIS — M25651 Stiffness of right hip, not elsewhere classified: Secondary | ICD-10-CM | POA: Diagnosis not present

## 2023-12-21 DIAGNOSIS — M9904 Segmental and somatic dysfunction of sacral region: Secondary | ICD-10-CM | POA: Diagnosis not present

## 2023-12-21 DIAGNOSIS — M9903 Segmental and somatic dysfunction of lumbar region: Secondary | ICD-10-CM | POA: Diagnosis not present

## 2023-12-21 DIAGNOSIS — M4322 Fusion of spine, cervical region: Secondary | ICD-10-CM | POA: Diagnosis not present

## 2023-12-24 DIAGNOSIS — M9903 Segmental and somatic dysfunction of lumbar region: Secondary | ICD-10-CM | POA: Diagnosis not present

## 2023-12-24 DIAGNOSIS — M9902 Segmental and somatic dysfunction of thoracic region: Secondary | ICD-10-CM | POA: Diagnosis not present

## 2023-12-24 DIAGNOSIS — M9905 Segmental and somatic dysfunction of pelvic region: Secondary | ICD-10-CM | POA: Diagnosis not present

## 2023-12-24 DIAGNOSIS — M9904 Segmental and somatic dysfunction of sacral region: Secondary | ICD-10-CM | POA: Diagnosis not present

## 2023-12-24 DIAGNOSIS — M7918 Myalgia, other site: Secondary | ICD-10-CM | POA: Diagnosis not present

## 2023-12-24 DIAGNOSIS — M4322 Fusion of spine, cervical region: Secondary | ICD-10-CM | POA: Diagnosis not present

## 2023-12-24 DIAGNOSIS — G5603 Carpal tunnel syndrome, bilateral upper limbs: Secondary | ICD-10-CM | POA: Diagnosis not present

## 2023-12-24 DIAGNOSIS — M25651 Stiffness of right hip, not elsewhere classified: Secondary | ICD-10-CM | POA: Diagnosis not present

## 2023-12-29 DIAGNOSIS — M5013 Cervical disc disorder with radiculopathy, cervicothoracic region: Secondary | ICD-10-CM | POA: Diagnosis not present

## 2023-12-29 DIAGNOSIS — M545 Low back pain, unspecified: Secondary | ICD-10-CM | POA: Diagnosis not present

## 2024-01-04 ENCOUNTER — Encounter: Payer: Self-pay | Admitting: Nurse Practitioner

## 2024-01-04 DIAGNOSIS — R030 Elevated blood-pressure reading, without diagnosis of hypertension: Secondary | ICD-10-CM | POA: Diagnosis not present

## 2024-01-04 DIAGNOSIS — M5412 Radiculopathy, cervical region: Secondary | ICD-10-CM | POA: Diagnosis not present

## 2024-01-04 DIAGNOSIS — E782 Mixed hyperlipidemia: Secondary | ICD-10-CM | POA: Diagnosis not present

## 2024-01-04 DIAGNOSIS — D509 Iron deficiency anemia, unspecified: Secondary | ICD-10-CM | POA: Diagnosis not present

## 2024-01-04 DIAGNOSIS — E039 Hypothyroidism, unspecified: Secondary | ICD-10-CM | POA: Diagnosis not present

## 2024-01-05 ENCOUNTER — Other Ambulatory Visit: Payer: Self-pay | Admitting: Family Medicine

## 2024-01-05 ENCOUNTER — Other Ambulatory Visit: Payer: Self-pay | Admitting: Nurse Practitioner

## 2024-01-05 DIAGNOSIS — R1011 Right upper quadrant pain: Secondary | ICD-10-CM

## 2024-01-05 DIAGNOSIS — R1901 Right upper quadrant abdominal swelling, mass and lump: Secondary | ICD-10-CM

## 2024-01-07 ENCOUNTER — Other Ambulatory Visit: Payer: Self-pay | Admitting: Family Medicine

## 2024-01-07 DIAGNOSIS — R1011 Right upper quadrant pain: Secondary | ICD-10-CM

## 2024-01-08 ENCOUNTER — Other Ambulatory Visit: Payer: Self-pay | Admitting: Family Medicine

## 2024-01-08 ENCOUNTER — Ambulatory Visit
Admission: RE | Admit: 2024-01-08 | Discharge: 2024-01-08 | Disposition: A | Discharge status: Home / Self Care | Source: Ambulatory Visit | Attending: Nurse Practitioner | Admitting: Nurse Practitioner

## 2024-01-08 DIAGNOSIS — R1011 Right upper quadrant pain: Secondary | ICD-10-CM

## 2024-01-08 DIAGNOSIS — R1901 Right upper quadrant abdominal swelling, mass and lump: Secondary | ICD-10-CM | POA: Diagnosis not present

## 2024-01-12 DIAGNOSIS — M5440 Lumbago with sciatica, unspecified side: Secondary | ICD-10-CM | POA: Diagnosis not present

## 2024-01-12 DIAGNOSIS — Z6829 Body mass index (BMI) 29.0-29.9, adult: Secondary | ICD-10-CM | POA: Diagnosis not present

## 2024-01-12 DIAGNOSIS — M542 Cervicalgia: Secondary | ICD-10-CM | POA: Diagnosis not present

## 2024-01-13 DIAGNOSIS — M5412 Radiculopathy, cervical region: Secondary | ICD-10-CM | POA: Diagnosis not present

## 2024-01-13 DIAGNOSIS — Z79891 Long term (current) use of opiate analgesic: Secondary | ICD-10-CM | POA: Diagnosis not present

## 2024-01-18 ENCOUNTER — Other Ambulatory Visit

## 2024-01-22 DIAGNOSIS — M542 Cervicalgia: Secondary | ICD-10-CM | POA: Diagnosis not present

## 2024-01-22 DIAGNOSIS — F419 Anxiety disorder, unspecified: Secondary | ICD-10-CM | POA: Diagnosis not present

## 2024-01-22 DIAGNOSIS — K089 Disorder of teeth and supporting structures, unspecified: Secondary | ICD-10-CM | POA: Diagnosis not present

## 2024-01-22 DIAGNOSIS — K469 Unspecified abdominal hernia without obstruction or gangrene: Secondary | ICD-10-CM | POA: Diagnosis not present

## 2024-01-27 ENCOUNTER — Telehealth (HOSPITAL_BASED_OUTPATIENT_CLINIC_OR_DEPARTMENT_OTHER): Payer: Self-pay | Admitting: *Deleted

## 2024-01-27 NOTE — Telephone Encounter (Signed)
 Will update all parties involved pt has appt 02/03/24.

## 2024-01-27 NOTE — Telephone Encounter (Signed)
   Pre-operative Risk Assessment    Patient Name: Charlotte Wise  DOB: 1975-08-16 MRN: [id number]   Date of last office visit: 04/02/23 DAMIEN BRAVER, NP Date of next office visit: 02/03/24 [facility] MONGE, NP  Dental Requests If request is for dental extraction, please clarify the number of teeth to be extracted and place under No 1 below.   If the patient is currently at the dentist's office, send SecureChat message to Pre-Op Callback Staff (CMA/nurse) to input urgent request.   If the patient is not currently in the dentist's office, please route to the Pre-Op pool.              :1} Request for Surgical Clearance    Procedure:  Dental Extraction - Amount of Teeth to be Pulled:  28 TEETH TOTAL OVER 2-4 APPT'S PER DR. MOHORN, DDS  Date of Surgery:  Clearance TBD                                Surgeon:  DR. SELINDA JACOBSEN, DDS Surgeon's Group or Practice Name:  [facility] Phone number:  [phone] Fax number:  [phone]   Type of Clearance Requested:   - Medical    Type of Anesthesia:  LOCAL WITH NITROUS OXIDE vs MODERATE CONSCIOUS SEDATION   Additional requests/questions:    Signed, Jonathon Castelo   01/27/2024, 3:49 PM

## 2024-01-27 NOTE — Telephone Encounter (Signed)
   Name: Charlotte Wise  DOB: 1976/03/20  MRN: [id number]  Primary Cardiologist: Wilbert Bihari, MD  Chart reviewed as part of pre-operative protocol coverage. The patient has an upcoming visit scheduled with Damien Braver, DNP,  on 02/03/2024 at which time clearance can be addressed in case there are any issues that would impact surgical recommendations.  Dental Extraction - Amount of Teeth to be Pulled:  28 TEETH TOTAL OVER 2-4 Is not scheduled until TBD as below. I added preop FYI to appointment note so that provider is aware to address at time of outpatient visit.  Per office protocol the cardiology provider should forward their finalized clearance decision and recommendations regarding antiplatelet therapy to the requesting party below.     Patient is not on anticoagulation or antiplatelet per review of current medical record in Epic.    I will route this message as FYI to requesting party and remove this message from the preop box as separate preop APP input not needed at this time.   Please call with any questions.  Lamarr Satterfield, NP  01/27/2024, 4:04 PM

## 2024-02-03 ENCOUNTER — Ambulatory Visit: Attending: Nurse Practitioner | Admitting: Nurse Practitioner

## 2024-02-03 ENCOUNTER — Encounter: Payer: Self-pay | Admitting: Nurse Practitioner

## 2024-02-03 VITALS — BP 139/89 | HR 62 | Ht 61.0 in | Wt 149.0 lb

## 2024-02-03 DIAGNOSIS — R002 Palpitations: Secondary | ICD-10-CM

## 2024-02-03 DIAGNOSIS — G4719 Other hypersomnia: Secondary | ICD-10-CM

## 2024-02-03 DIAGNOSIS — M9905 Segmental and somatic dysfunction of pelvic region: Secondary | ICD-10-CM | POA: Diagnosis not present

## 2024-02-03 DIAGNOSIS — M25651 Stiffness of right hip, not elsewhere classified: Secondary | ICD-10-CM | POA: Diagnosis not present

## 2024-02-03 DIAGNOSIS — I471 Supraventricular tachycardia, unspecified: Secondary | ICD-10-CM

## 2024-02-03 DIAGNOSIS — Z01810 Encounter for preprocedural cardiovascular examination: Secondary | ICD-10-CM | POA: Diagnosis not present

## 2024-02-03 DIAGNOSIS — M9904 Segmental and somatic dysfunction of sacral region: Secondary | ICD-10-CM | POA: Diagnosis not present

## 2024-02-03 DIAGNOSIS — M7918 Myalgia, other site: Secondary | ICD-10-CM | POA: Diagnosis not present

## 2024-02-03 DIAGNOSIS — M9902 Segmental and somatic dysfunction of thoracic region: Secondary | ICD-10-CM | POA: Diagnosis not present

## 2024-02-03 DIAGNOSIS — E782 Mixed hyperlipidemia: Secondary | ICD-10-CM

## 2024-02-03 DIAGNOSIS — M4322 Fusion of spine, cervical region: Secondary | ICD-10-CM | POA: Diagnosis not present

## 2024-02-03 DIAGNOSIS — M9903 Segmental and somatic dysfunction of lumbar region: Secondary | ICD-10-CM | POA: Diagnosis not present

## 2024-02-03 NOTE — Patient Instructions (Signed)
 Medication Instructions:  Your physician recommends that you continue on your current medications as directed. Please refer to the Current Medication list given to you today.  *If you need a refill on your cardiac medications before your next appointment, please call your pharmacy*  Lab Work: NONE ordered at this time of appointment   Testing/Procedures: NONE ordered at this time of appointment   Follow-Up: At [facility], you and your health needs are our priority.  As part of our continuing mission to provide you with exceptional heart care, our providers are all part of one team.  This team includes your primary Cardiologist (physician) and Advanced Practice Providers or APPs (Physician Assistants and Nurse Practitioners) who all work together to provide you with the care you need, when you need it.  Your next appointment:   1 year(s)  Provider:   Wilbert Bihari, MD    We recommend signing up for the patient portal called MyChart.  Sign up information is provided on this After Visit Summary.  MyChart is used to connect with patients for Virtual Visits (Telemedicine).  Patients are able to view lab/test results, encounter notes, upcoming appointments, etc.  Non-urgent messages can be sent to your provider as well.   To learn more about what you can do with MyChart, go to [email or web address].

## 2024-02-03 NOTE — Progress Notes (Signed)
 Office Visit    Patient Name: Charlotte Wise Date of Encounter: 02/03/2024  Primary Care Provider:  Laurice President, NP Primary Cardiologist:  Wilbert Bihari, MD  Chief Complaint    48 year old female with a history of palpitations, PSVT, hyperlipidemia, anxiety, depression, and thyroid  disease who presents for follow-up related to palpitations, SVT and for preoperative cardiac evaluation.   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   Sciatica of right side 07/27/2019   Thyroid  disease    Past Surgical History:  Procedure Laterality Date   ANKLE ARTHROSCOPY Right 2000   COLONOSCOPY  05/01/2020   KNEE ARTHROSCOPY Left 1988   KNEE ARTHROSCOPY Right 1999   LAPAROSCOPY  2001   NASAL POLYP SURGERY  2006   TONSILLECTOMY AND ADENOIDECTOMY  1981   UPPER GASTROINTESTINAL ENDOSCOPY  05/01/2020    Allergies  Allergies  Allergen Reactions   Penicillins Swelling     Labs/Other Studies Reviewed    The following studies were reviewed today:  Cardiac Studies & Procedures   ______________________________________________________________________________________________        SHERRILEE  LONG TERM MONITOR (3-14 DAYS) 12/08/2022  Narrative   Patient had a minimum heart rate of 47 bpm, maximum heart rate of 138 bpm, and average heart rate of 67 bpm.   Predominant underlying rhythm was sinus rhythm.   Several short runs of paroxysmal supraventricular tachycardia, longest was 14 beats.  Fastest was 138 bpm.   Isolated PACs were rare (<1.0%).   Isolated PVCs were occasional (1.4%).   Triggered and diary events associated with sinus rhythm, rarely SVT.  Majority of events associated with sinus rhythm.  Rarely may have symptomatic SVT.        ______________________________________________________________________________________________     Recent Labs: No results found for requested labs within last 365 days.  Recent Lipid Panel    Component Value Date/Time   CHOL 175 09/15/2022 1434   TRIG 118 09/15/2022 1434   HDL 65 09/15/2022 1434   CHOLHDL 2.7 09/15/2022 1434   VLDL 26 10/09/2016 1604   LDLCALC 89 09/15/2022 1434    History of Present Illness    48 year old female with the above past medical history including palpitations, PSVT, hyperlipidemia, anxiety, depression, and thyroid  disease.   She was referred to cardiology in the setting of SVT/palpitations.  Cardiac monitor in 11/2022 in the setting of palpitations following initiation of phentermine  therapy revealed predominantly sinus rhythm, several short runs of PSVT, longest lasting 14 beats, rare PACs and PVCs.  She was started on metoprolol .  Echocardiogram was ordered but not completed.  She noted excessive daytime sleepiness, she was referred for sleep study, however, this was also deferred due to cost.  She was last seen in the office on 04/02/2023 and was stable from a cardiac standpoint.  She reported intermittent fleeting palpitations, overall stable.    She presents today for follow-up and for preoperative cardiac evaluation for upcoming dental extraction (2018), with Dr. Jason [facility] of [facility].  Since her last visit she has been stable from a cardiac standpoint.  She denies any chest pain, palpitations, dizziness, dyspnea, edema, PND, orthopnea, weight gain.  Overall, she reports feeling well.  Home Medications    Current Outpatient  Medications  Medication Sig Dispense Refill   acetaminophen  (TYLENOL ) 500 MG tablet Take 500 mg by mouth every 6 (six) hours as needed. Takes 0-6 per day depending on her pain level     aluminum-magnesium  hydroxide-simethicone  (MAALOX) 200-200-20 MG/5ML SUSP Take 20 mLs by mouth 3 (three) times  daily.     Bismuth Subsalicylate (PEPTO-BISMOL PO) Take by mouth as needed.     Ca Carbonate-Mag Hydroxide (ROLAIDS PO) Take 1 each by mouth as needed.     gabapentin  (NEURONTIN ) 400 MG capsule Take 800 mg by mouth 4 (four) times daily.     Glucosamine-Chondroitin 1500-1200 MG/30ML LIQD Take by mouth.     Ibuprofen 200 MG CAPS Take by mouth daily.     lactobacillus acidophilus & bulgar (LACTINEX) chewable tablet Chew 1 tablet by mouth 3 (three) times daily with meals. 90 tablet 1   levothyroxine  (SYNTHROID ) 75 MCG tablet TAKE 1 TABLET DAILY ON EMPTY STOMACH WITH ONLY WATER FOR 30 MINUTES & NO ANTACID MEDS, CALCIUM OR MAGNESIUM  FOR 4 HOURS AND AVOID BIOTIN 90 tablet 3   MAGNESIUM  PO Take 400 mg by mouth daily.     methocarbamol  (ROBAXIN -750) 750 MG tablet Take 1 tablet (750 mg total) by mouth every 8 (eight) hours as needed for muscle spasms. 60 tablet 1   metoprolol  tartrate (LOPRESSOR ) 25 MG tablet Take 1 tablet (25 mg total) by mouth 2 (two) times daily. You may also take an extra 25 mg tablet for breakthrough palpitations. (Patient taking differently: Take 25 mg by mouth 2 (two) times daily as needed. You may also take an extra 25 mg tablet for breakthrough palpitations.) 200 tablet 3   Multiple Vitamins-Minerals (MULTIVITAMIN ADULTS PO) Take 1 tablet by mouth daily.     oxyCODONE -acetaminophen  (PERCOCET/ROXICET) 5-325 MG tablet Take 1 tablet by mouth every 4 (four) hours as needed.     promethazine  (PHENERGAN ) 25 MG tablet TAKE 1 TABLET BY MOUTH EVERY 6 HOURS AS NEEDED FOR NAUSEA OR HEADACHE 30 tablet 0   VITAMIN D  PO Take 10,000 Units by mouth daily.     Zinc  50 MG TABS Take 1 tablet Daily  0   amitriptyline  (ELAVIL ) 75 MG tablet Take 1 tablet (75 mg total) by mouth at bedtime. Pharmacy d/c rx for 50 mg dosing. thx (Patient not taking: Reported on 02/03/2024) 30 tablet 2   hydrOXYzine  (ATARAX ) 25 MG tablet Take 25 mg by mouth daily as needed.     LO LOESTRIN FE 1 MG-10 MCG / 10 MCG tablet Take  1 tablet by mouth daily. (Patient not taking: Reported on 02/03/2024)     Na Sulfate-K Sulfate-Mg Sulf (SUPREP BOWEL PREP KIT) 17.5-3.13-1.6 GM/177ML SOLN Take 1 kit by mouth as directed. 324 mL 0   No current facility-administered medications for this visit.     Review of Systems    She denies chest pain, palpitations, dyspnea, pnd, orthopnea, n, v, dizziness, syncope, edema, weight gain, or early satiety. All other systems reviewed and are otherwise negative except as noted above.   Physical Exam    VS:  BP 139/89 (BP Location: Right Arm, Cuff Size: Large)   Pulse 62   Ht 5' 1 (1.549 m)   Wt 149 lb (67.6 kg)   SpO2 98%   BMI 28.15 kg/m  GEN: Well nourished, well developed, in no acute distress. HEENT: normal. Neck: Supple, no JVD, carotid bruits, or masses. Cardiac: RRR, no murmurs, rubs, or gallops. No clubbing, cyanosis, edema.  Radials/DP/PT 2+ and equal bilaterally.  Respiratory:  Respirations regular and unlabored, clear to auscultation bilaterally. GI: Soft, nontender, nondistended, BS + x 4. MS: no deformity or atrophy. Skin: warm and dry, no rash. Neuro:  Strength and sensation are intact. Psych: Normal affect.  Accessory Clinical Findings    ECG personally reviewed by me today - EKG Interpretation Date/Time:  Wednesday February 03 2024 08:13:56 EST Ventricular Rate:  62 PR Interval:  170 QRS Duration:  86 QT Interval:  398 QTC Calculation: 403 R Axis:   49  Text Interpretation: Normal sinus rhythm with sinus arrhythmia Normal ECG When compared with ECG of 08-Nov-2022 13:15, PREVIOUS ECG IS PRESENT Confirmed by Daneen Perkins ([id number]) on 02/03/2024 8:22:38 AM  - no acute changes.   Lab Results  Component Value Date   WBC 7.4 11/08/2022   HGB 12.1 11/08/2022   HCT 35.9 (L) 11/08/2022   MCV 93.7 11/08/2022   PLT 390 11/08/2022   Lab Results  Component Value Date   CREATININE 0.81 01/28/2023   BUN 6 01/28/2023   NA 140 01/28/2023   K 5.2 01/28/2023   CL  104 01/28/2023   CO2 26 01/28/2023   Lab Results  Component Value Date   ALT 18 11/08/2022   AST 29 11/08/2022   ALKPHOS 40 11/08/2022   BILITOT 0.9 11/08/2022   Lab Results  Component Value Date   CHOL 175 09/15/2022   HDL 65 09/15/2022   LDLCALC 89 09/15/2022   TRIG 118 09/15/2022   CHOLHDL 2.7 09/15/2022    Lab Results  Component Value Date   HGBA1C 5.3 06/09/2022    Assessment & Plan    1. Palpitations/PSVT: Cardiac monitor in 11/2022 in the setting of palpitations (and following initiation of phentermine  therapy) revealed predominantly sinus rhythm, several short runs of PSVT, longest lasting 14 beats, rare PACs and PVCs. She has been taking metoprolol  only as needed with infrequent use.  She denies any recent palpitations. She denies any symptoms concerning for angina.  Through shared decision making, and given improvement in symptoms, overall reassuring cardiac monitor, will defer any additional cardiac testing at this time.  Continue metoprolol  as needed.   2. Excessive daytime sleepiness: Resolved.  No indication for further testing at this time.   3. Hyperlipidemia: LDL was 101 in 05/2023.  The 10-year ASCVD risk score (Arnett DK, et al., 2019) is: 0.9%. Not on statin therapy.  Monitored and managed per PCP.  4. Preoperative cardiac exam According to the Revised Cardiac Risk Index (RCRI), her Perioperative Risk of Major Cardiac Event is (%): 0.4. Her Functional Capacity in METs is: 8.97 according to the Duke Activity Status Index (DASI). Therefore, based on ACC/AHA guidelines, patient would be at acceptable risk for the planned procedure without further cardiovascular testing. I will route this recommendation to the requesting party via Epic fax function.   5. Disposition: Follow-up in 1 year.       Perkins JAYSON Daneen, NP 02/03/2024, 8:38 AM

## 2024-02-10 DIAGNOSIS — M5412 Radiculopathy, cervical region: Secondary | ICD-10-CM | POA: Diagnosis not present

## 2024-02-12 DIAGNOSIS — G5603 Carpal tunnel syndrome, bilateral upper limbs: Secondary | ICD-10-CM | POA: Diagnosis not present

## 2024-02-22 DIAGNOSIS — K047 Periapical abscess without sinus: Secondary | ICD-10-CM | POA: Diagnosis not present

## 2024-02-22 DIAGNOSIS — M5412 Radiculopathy, cervical region: Secondary | ICD-10-CM | POA: Diagnosis not present

## 2024-03-10 ENCOUNTER — Ambulatory Visit: Admitting: Gastroenterology

## 2024-03-14 DIAGNOSIS — M9903 Segmental and somatic dysfunction of lumbar region: Secondary | ICD-10-CM | POA: Diagnosis not present

## 2024-03-14 DIAGNOSIS — M25651 Stiffness of right hip, not elsewhere classified: Secondary | ICD-10-CM | POA: Diagnosis not present

## 2024-03-14 DIAGNOSIS — M9904 Segmental and somatic dysfunction of sacral region: Secondary | ICD-10-CM | POA: Diagnosis not present

## 2024-03-14 DIAGNOSIS — M9902 Segmental and somatic dysfunction of thoracic region: Secondary | ICD-10-CM | POA: Diagnosis not present

## 2024-03-14 DIAGNOSIS — M4322 Fusion of spine, cervical region: Secondary | ICD-10-CM | POA: Diagnosis not present

## 2024-03-14 DIAGNOSIS — M9905 Segmental and somatic dysfunction of pelvic region: Secondary | ICD-10-CM | POA: Diagnosis not present

## 2024-03-14 DIAGNOSIS — M7918 Myalgia, other site: Secondary | ICD-10-CM | POA: Diagnosis not present

## 2024-03-14 DIAGNOSIS — M25551 Pain in right hip: Secondary | ICD-10-CM | POA: Diagnosis not present

## 2024-03-21 DIAGNOSIS — M542 Cervicalgia: Secondary | ICD-10-CM | POA: Diagnosis not present

## 2024-03-21 DIAGNOSIS — R030 Elevated blood-pressure reading, without diagnosis of hypertension: Secondary | ICD-10-CM | POA: Diagnosis not present

## 2024-03-22 DIAGNOSIS — M5412 Radiculopathy, cervical region: Secondary | ICD-10-CM | POA: Diagnosis not present

## 2024-03-31 ENCOUNTER — Ambulatory Visit: Admitting: Gastroenterology

## 2024-03-31 ENCOUNTER — Encounter: Payer: Self-pay | Admitting: Gastroenterology

## 2024-03-31 VITALS — BP 124/72 | HR 93 | Ht 61.0 in | Wt 154.0 lb

## 2024-03-31 DIAGNOSIS — K580 Irritable bowel syndrome with diarrhea: Secondary | ICD-10-CM

## 2024-03-31 DIAGNOSIS — R12 Heartburn: Secondary | ICD-10-CM

## 2024-03-31 DIAGNOSIS — K032 Erosion of teeth: Secondary | ICD-10-CM | POA: Diagnosis not present

## 2024-03-31 DIAGNOSIS — K219 Gastro-esophageal reflux disease without esophagitis: Secondary | ICD-10-CM

## 2024-03-31 DIAGNOSIS — Z860100 Personal history of colon polyps, unspecified: Secondary | ICD-10-CM

## 2024-03-31 MED ORDER — NA SULFATE-K SULFATE-MG SULF 17.5-3.13-1.6 GM/177ML PO SOLN: 1.0000 | ORAL | 0 refills | Status: AC

## 2024-03-31 NOTE — Patient Instructions (Addendum)
 _______________________________________________________  If your blood pressure at your visit was 140/90 or greater, please contact your primary care physician to follow up on this.  _______________________________________________________  If you are age 50 or older, your body mass index should be between 23-30. Your Body mass index is 29.1 kg/m. If this is out of the aforementioned range listed, please consider follow up with your Primary Care Provider.  If you are age 43 or younger, your body mass index should be between 19-25. Your Body mass index is 29.1 kg/m. If this is out of the aformentioned range listed, please consider follow up with your Primary Care Provider.   ________________________________________________________  The [facility] providers would like to encourage you to use MYCHART to communicate with providers for non-urgent requests or questions.  Due to long hold times on the telephone, sending your provider a message by [facility] may be a faster and more efficient way to get a response.  Please allow 48 business hours for a response.  Please remember that this is for non-urgent requests.  _______________________________________________________  [facility] is using a team-based approach to care.  Your team is made up of your doctor and two to three APPS. Our APPS (Nurse Practitioners and Physician Assistants) work with your physician to ensure care continuity for you. They are fully qualified to address your health concerns and develop a treatment plan. They communicate directly with your gastroenterologist to care for you. Seeing the Advanced Practice Practitioners on your physician's team can help you by facilitating care more promptly, often allowing for earlier appointments, access to diagnostic testing, procedures, and other specialty referrals.   You have been scheduled for a colonoscopy. Please follow written instructions given to you at your visit today.    If you use inhalers (even only as needed), please bring them with you on the day of your procedure.  DO NOT TAKE 7 DAYS PRIOR TO TEST- Trulicity (dulaglutide) Ozempic, Wegovy (semaglutide) Mounjaro, Zepbound (tirzepatide) Bydureon Bcise (exanatide extended release)  DO NOT TAKE 1 DAY PRIOR TO YOUR TEST Rybelsus (semaglutide) Adlyxin (lixisenatide) Victoza (liraglutide) Byetta (exanatide) ___________________________________________________________________________  Due to recent changes in healthcare laws, you may see the results of your imaging and laboratory studies on MyChart before your provider has had a chance to review them.  We understand that in some cases there may be results that are confusing or concerning to you. Not all laboratory results come back in the same time frame and the provider may be waiting for multiple results in order to interpret others.  Please give us  48 hours in order for your provider to thoroughly review all the results before contacting the office for clarification of your results.   It was a pleasure to see you today!  Vito Cirigliano, D.O.

## 2024-03-31 NOTE — Progress Notes (Signed)
 "  Chief Complaint:    Enamel erosion, reflux  GI History: 49 year old female with a history of hepatic steatosis, anxiety, schizophreniform disorder, headaches, depression, hyperlipidemia, hypothyroidism, follows in the GI clinic for the following:   1) GERD: History of enamel erosion but no typical reflux symptoms until 02/2020 (heartburn, upper abdominal pain, early satiety) -02/2020: Normal CBC, CMP.  H. pylori breath test negative.  Started on Pepcid  -03/2020: Changed Pepcid  to omeprazole , discontinued all NSAIDs -04/2020: EGD: 2 cm tongue of salmon-colored mucosa (path negative for Barrett's), Hill grade 1, minimal non-H. 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  Stopped taking pantoprazole  on her own and went back to omeprazole .  Feels intolerant of capsules/tablets and wants a liquid or chewable.  She crushes any tablets (even APAP) and opens capsules due to feeling of abdominal pain if she does not do this.  Started Prevacid  (she stopped taking on her own a couple months later) referred for EM and pH/impedance testing off acid suppression therapy   2) IBS: Was previously predominantly constipation and well controlled with OTC mag supplements, but in 2022 started developing watery, nonbloody diarrhea with associated abdominal pain/cramping.  Now symptoms alternating, but more so diarrhea predominant. - 07/2020: Negative/normal GI PCR panel, calprotectin - 11/09/2020: CT AP: Gas and stool throughout the colon, otherwise normal - 11/21/2020: Normal CBC, CMP.  TSH 5.22 with repeat 3.57 in 12/2020 - 05/03/2021: GI follow-up.  Symptoms improving with Pepto-Bismol, psyllium, OTC digestive enzyme.  Normal pancreatic elastase, CBC, CMP, TSH - 08/07/2022: Started Elavil  with good response and increased to 50 mg qhs. - 11/11/2022: GI follow-up.   Continued IBS-D symptoms, but overall improved with Elavil  50 mg daily.  No change with Viberzi  or Lomotil  so stopped taking on her own.    Endoscopic History: -EGD (04/2020): 2 cm tongue of salmon-colored mucosa (path negative for Barrett's),; mucosa, Hill grade 1, minimal non-H. pylori gastritis, normal duodenum -Colonoscopy (04/2020): 1 SSP, 1 traditional serrated adenoma, 3 benign polyps.  Sigmoid/cecal diverticulosis, grade 2 internal hemorrhoids.  Repeat in 3 years  HPI:     Patient is a 49 y.o. female presenting to the Gastroenterology Clinic for follow-up.  Was last seen in the office by me on 02/26/2023.  At that time, increased Elavil  to 75 mg nightly, recommended starting Benefiber.  At that time, she had stopped all acid suppression medications recommended esophageal manometry and pH/impedance study (off PPI) which was scheduled twice and canceled each time due to insurance/cost issues.  Scheduled for colonoscopy for ongoing polyp surveillance, but she subsequently canceled due to insurance issues.  Her main issue today is to discuss overall reflux may be playing with her enamel erosion.  I reviewed a wonderfully written letter from her Dentist, Dr. 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  This has been a progressive issue since 01/2015.  I reviewed the supplied images which shows progression from mild erosive damage to now having severe generalized acid erosion.  She is now contemplating oral surgery, and has an appointment with Dr. Matilde at [facility] next week.  With regards to her reflux, we have trialed multiple medications over the years as outlined above.  She is currently taking Pepto, Mylanta, Alka-Seltzer, Tums on demand.  Symptoms do respond to those OTC agents, but recurrence of symptoms regularly.  No dysphagia.  She has since stopped  amitryptiline.  This was previously working well for her from an IBS standpoint.  Abdominal  ultrasound done in 12/2023 and notable for small fat-containing hernia.  Otherwise no new labs for review today.   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 Takes 0-6 per day depending on her pain level     aluminum-magnesium  hydroxide-simethicone  (MAALOX) 200-200-20 MG/5ML SUSP Take 20 mLs by mouth 3 (three) times daily.     amitriptyline  (ELAVIL ) 75 MG tablet Take 1 tablet (75 mg total) by mouth at bedtime. Pharmacy d/c rx for 50 mg dosing. thx (Patient not taking: Reported on 02/03/2024) 30 tablet 2   Bismuth Subsalicylate (PEPTO-BISMOL PO) Take by mouth as needed.     Ca Carbonate-Mag Hydroxide (ROLAIDS PO) Take 1 each by mouth as needed.     gabapentin  (NEURONTIN ) 400 MG capsule Take 800 mg by mouth 4 (four) times daily.     Glucosamine-Chondroitin 1500-1200 MG/30ML LIQD Take by mouth.     hydrOXYzine  (ATARAX ) 25 MG tablet Take 25 mg by mouth daily as needed.     Ibuprofen 200 MG CAPS Take by mouth daily.     lactobacillus acidophilus & bulgar (LACTINEX) chewable tablet Chew 1 tablet by mouth 3 (three) times daily with meals. 90 tablet 1   levothyroxine  (SYNTHROID ) 75 MCG tablet TAKE 1 TABLET DAILY ON EMPTY STOMACH WITH ONLY WATER FOR 30 MINUTES & NO ANTACID MEDS, CALCIUM OR MAGNESIUM  FOR 4 HOURS AND AVOID BIOTIN 90 tablet 3   LO LOESTRIN FE 1 MG-10 MCG / 10 MCG  tablet Take 1 tablet by mouth daily. (Patient not taking: Reported on 02/03/2024)     MAGNESIUM  PO Take 400 mg by mouth daily.     methocarbamol  (ROBAXIN -750) 750 MG tablet Take 1 tablet (750 mg total) by mouth every 8 (eight) hours as needed for muscle spasms. 60 tablet 1   metoprolol  tartrate (LOPRESSOR ) 25 MG tablet Take 1 tablet (25 mg total) by mouth 2 (two) times daily. You may also take an extra 25 mg tablet for breakthrough palpitations. (Patient taking differently: Take 25 mg by mouth 2 (two) times daily as needed. You may also take an extra 25 mg tablet for breakthrough palpitations.) 200 tablet 3   Multiple Vitamins-Minerals (MULTIVITAMIN ADULTS PO) Take 1 tablet by mouth daily.     Na Sulfate-K Sulfate-Mg Sulf (SUPREP BOWEL PREP KIT) 17.5-3.13-1.6 GM/177ML SOLN Take 1 kit by mouth as directed. 324 mL 0   oxyCODONE -acetaminophen  (PERCOCET/ROXICET) 5-325 MG tablet Take 1 tablet by mouth every 4 (four) hours as needed.     promethazine  (PHENERGAN ) 25 MG tablet TAKE 1 TABLET BY MOUTH EVERY 6 HOURS AS NEEDED FOR NAUSEA OR HEADACHE 30 tablet 0   VITAMIN D  PO Take 10,000 Units by mouth daily.     Zinc  50 MG TABS  Take 1 tablet Daily  0   No current facility-administered medications for this visit.    Physical Exam:     Ht 5' 1 (1.549 m)   Wt 154 lb (69.9 kg)   LMP 04/02/2016   BMI 29.10 kg/m   GENERAL:  Pleasant female in NAD PSYCH: : Cooperative, normal affect NEURO: Alert and oriented x 3, no focal neurologic deficits   IMPRESSION and PLAN:    1) GERD 2) Waterbrash 3) Enamel erosion Longstanding history of reflux symptoms.  Has been on multiple acid suppression medications over the years with variable response.  Currently just using OTC agents, which do help with symptoms.  She has been following with Dr. Paris regularly at [facility] for progressive enamel erosion over the years, felt to be secondary to GERD.  She is now considering more invasive oral surgery, to  potentially include full mouth extractions with complete dentures.  We discussed the pathophysiology of reflux at great length today.  We discussed the relationship of uncontrolled GERD and enamel erosion at length today.  We discussed medical management strategies and briefly discussed the role/utility of antireflux surgery.  She has had a prior EGD.  We discussed the role/utility of additional diagnostic studies, which could include barium esophagram, pH/impedance study, and repeat EGD with Bravo placement.  She would like to hold off on studies that are purely diagnostic for the time being unless absolutely necessary from a cost/insurance standpoint. - I will forward my note along with a copy of the note and records from Dr. Paris to Lamar Jaeger, DDS, MD at [facility], whom she is scheduled to see next week - Continue antireflux lifestyle/dietary modifications - Continue current therapy  4) IBS-D - Patient no longer taking Elavil .  Can restart if/when she needs  5) History of colon polyps - Now overdue for repeat colonoscopy for ongoing polyp surveillance. - Schedule colonoscopy - Will hopefully be able to get a clear answer from insurance about coverage along with any potential cost to the patient  I spent 45 minutes of time, including in depth chart review, independent review of results as outlined above, communicating results with the patient directly, face-to-face time with the patient, coordinating care, ordering studies and medications as appropriate, and documentation.       Sandor LULLA Flatter ,DO, FACG 03/31/2024, 2:16 PM  "

## 2024-04-23 ENCOUNTER — Encounter: Payer: Self-pay | Admitting: Gastroenterology

## 2024-06-16 ENCOUNTER — Encounter: Admitting: Gastroenterology
